# Patient Record
Sex: Male | Born: 1940 | Race: White | Hispanic: No | Marital: Married | Smoking: Former smoker
Health system: Southern US, Community
[De-identification: ages and names within clinical notes are randomized; demographics above are authoritative.]

## PROBLEM LIST (undated history)

## (undated) DIAGNOSIS — J449 Chronic obstructive pulmonary disease, unspecified: Secondary | ICD-10-CM

## (undated) DIAGNOSIS — E669 Obesity, unspecified: Secondary | ICD-10-CM

## (undated) DIAGNOSIS — I2510 Atherosclerotic heart disease of native coronary artery without angina pectoris: Secondary | ICD-10-CM

## (undated) DIAGNOSIS — I5032 Chronic diastolic (congestive) heart failure: Secondary | ICD-10-CM

## (undated) DIAGNOSIS — N400 Enlarged prostate without lower urinary tract symptoms: Secondary | ICD-10-CM

## (undated) DIAGNOSIS — J9610 Chronic respiratory failure, unspecified whether with hypoxia or hypercapnia: Secondary | ICD-10-CM

## (undated) DIAGNOSIS — I10 Essential (primary) hypertension: Secondary | ICD-10-CM

## (undated) DIAGNOSIS — J9601 Acute respiratory failure with hypoxia: Secondary | ICD-10-CM

## (undated) DIAGNOSIS — C649 Malignant neoplasm of unspecified kidney, except renal pelvis: Secondary | ICD-10-CM

## (undated) DIAGNOSIS — E7800 Pure hypercholesterolemia, unspecified: Secondary | ICD-10-CM

## (undated) DIAGNOSIS — F17210 Nicotine dependence, cigarettes, uncomplicated: Secondary | ICD-10-CM

## (undated) DIAGNOSIS — J9611 Chronic respiratory failure with hypoxia: Secondary | ICD-10-CM

## (undated) DIAGNOSIS — I471 Supraventricular tachycardia: Secondary | ICD-10-CM

## (undated) DIAGNOSIS — K219 Gastro-esophageal reflux disease without esophagitis: Secondary | ICD-10-CM

## (undated) DIAGNOSIS — K429 Umbilical hernia without obstruction or gangrene: Secondary | ICD-10-CM

## (undated) HISTORY — DX: Nicotine dependence, cigarettes, uncomplicated: F17.210

## (undated) HISTORY — DX: Essential (primary) hypertension: I10

## (undated) HISTORY — DX: Chronic respiratory failure with hypoxia: J96.11

## (undated) HISTORY — DX: Pure hypercholesterolemia, unspecified: E78.00

## (undated) HISTORY — DX: Acute respiratory failure with hypoxia: J96.01

## (undated) HISTORY — DX: Chronic diastolic (congestive) heart failure: I50.32

## (undated) HISTORY — PX: STENT REMOVAL: SHX6421

## (undated) HISTORY — DX: Atherosclerotic heart disease of native coronary artery without angina pectoris: I25.10

## (undated) HISTORY — DX: Gastro-esophageal reflux disease without esophagitis: K21.9

## (undated) HISTORY — DX: Chronic obstructive pulmonary disease, unspecified: J44.9

## (undated) HISTORY — DX: Supraventricular tachycardia: I47.1

## (undated) HISTORY — DX: Benign prostatic hyperplasia without lower urinary tract symptoms: N40.0

## (undated) HISTORY — DX: Umbilical hernia without obstruction or gangrene: K42.9

## (undated) HISTORY — PX: COLONOSCOPY: SHX174

---

## 1988-08-01 HISTORY — PX: NEPHRECTOMY: SHX65

## 2004-08-17 ENCOUNTER — Ambulatory Visit: Payer: Self-pay | Admitting: Cardiology

## 2009-02-10 ENCOUNTER — Ambulatory Visit: Payer: Self-pay | Admitting: Family Medicine

## 2009-02-12 ENCOUNTER — Ambulatory Visit: Payer: Self-pay | Admitting: Family Medicine

## 2009-09-25 ENCOUNTER — Ambulatory Visit: Payer: Self-pay | Admitting: Family Medicine

## 2009-10-12 ENCOUNTER — Ambulatory Visit: Payer: Self-pay | Admitting: Family Medicine

## 2010-07-05 ENCOUNTER — Ambulatory Visit: Payer: Self-pay

## 2011-09-08 ENCOUNTER — Ambulatory Visit: Payer: Self-pay | Admitting: Family Medicine

## 2013-10-07 ENCOUNTER — Inpatient Hospital Stay: Payer: Self-pay | Admitting: Specialist

## 2013-10-07 LAB — CBC
HCT: 48.9 % (ref 40.0–52.0)
HGB: 15.7 g/dL (ref 13.0–18.0)
MCH: 27.7 pg (ref 26.0–34.0)
MCHC: 32.1 g/dL (ref 32.0–36.0)
MCV: 86 fL (ref 80–100)
Platelet: 304 10*3/uL (ref 150–440)
RBC: 5.66 10*6/uL (ref 4.40–5.90)
RDW: 16.1 % — AB (ref 11.5–14.5)
WBC: 19.7 10*3/uL — ABNORMAL HIGH (ref 3.8–10.6)

## 2013-10-07 LAB — COMPREHENSIVE METABOLIC PANEL
ALBUMIN: 3.4 g/dL (ref 3.4–5.0)
ALK PHOS: 116 U/L
Anion Gap: 3 — ABNORMAL LOW (ref 7–16)
BUN: 17 mg/dL (ref 7–18)
Bilirubin,Total: 0.4 mg/dL (ref 0.2–1.0)
CREATININE: 1.63 mg/dL — AB (ref 0.60–1.30)
Calcium, Total: 8.9 mg/dL (ref 8.5–10.1)
Chloride: 105 mmol/L (ref 98–107)
Co2: 31 mmol/L (ref 21–32)
EGFR (African American): 47 — ABNORMAL LOW
EGFR (Non-African Amer.): 41 — ABNORMAL LOW
Glucose: 148 mg/dL — ABNORMAL HIGH (ref 65–99)
OSMOLALITY: 282 (ref 275–301)
Potassium: 4.9 mmol/L (ref 3.5–5.1)
SGOT(AST): 29 U/L (ref 15–37)
SGPT (ALT): 18 U/L (ref 12–78)
Sodium: 139 mmol/L (ref 136–145)
Total Protein: 7.8 g/dL (ref 6.4–8.2)

## 2013-10-07 LAB — PRO B NATRIURETIC PEPTIDE: B-TYPE NATIURETIC PEPTID: 63 pg/mL (ref 0–450)

## 2013-10-07 LAB — TROPONIN I: Troponin-I: <0.02 ng/mL

## 2013-10-08 LAB — CBC WITH DIFFERENTIAL/PLATELET
BASOS PCT: 0.1 %
Basophil #: 0 10*3/uL (ref 0.0–0.1)
Eosinophil #: 0 10*3/uL (ref 0.0–0.7)
Eosinophil %: 0 %
HCT: 39.2 % — AB (ref 40.0–52.0)
HGB: 12.7 g/dL — ABNORMAL LOW (ref 13.0–18.0)
Lymphocyte #: 0.5 10*3/uL — ABNORMAL LOW (ref 1.0–3.6)
Lymphocyte %: 2.5 %
MCH: 28 pg (ref 26.0–34.0)
MCHC: 32.3 g/dL (ref 32.0–36.0)
MCV: 87 fL (ref 80–100)
MONO ABS: 0.2 x10 3/mm (ref 0.2–1.0)
MONOS PCT: 1.4 %
NEUTROS ABS: 17.5 10*3/uL — AB (ref 1.4–6.5)
Neutrophil %: 96 %
PLATELETS: 241 10*3/uL (ref 150–440)
RBC: 4.53 10*6/uL (ref 4.40–5.90)
RDW: 16.1 % — ABNORMAL HIGH (ref 11.5–14.5)
WBC: 18.2 10*3/uL — ABNORMAL HIGH (ref 3.8–10.6)

## 2013-10-08 LAB — BASIC METABOLIC PANEL
Anion Gap: 6 — ABNORMAL LOW (ref 7–16)
BUN: 22 mg/dL — ABNORMAL HIGH (ref 7–18)
CO2: 27 mmol/L (ref 21–32)
Calcium, Total: 8.1 mg/dL — ABNORMAL LOW (ref 8.5–10.1)
Chloride: 104 mmol/L (ref 98–107)
Creatinine: 2.36 mg/dL — ABNORMAL HIGH (ref 0.60–1.30)
EGFR (Non-African Amer.): 26 — ABNORMAL LOW
GFR CALC AF AMER: 30 — AB
GLUCOSE: 327 mg/dL — AB (ref 65–99)
Osmolality: 290 (ref 275–301)
Potassium: 4.5 mmol/L (ref 3.5–5.1)
Sodium: 137 mmol/L (ref 136–145)

## 2013-10-08 LAB — MAGNESIUM: Magnesium: 2.2 mg/dL

## 2013-10-09 DIAGNOSIS — I059 Rheumatic mitral valve disease, unspecified: Secondary | ICD-10-CM

## 2013-10-09 LAB — URINE CULTURE

## 2013-10-09 LAB — CBC WITH DIFFERENTIAL/PLATELET
BASOS ABS: 0.1 10*3/uL (ref 0.0–0.1)
BASOS PCT: 0.5 %
EOS ABS: 0 10*3/uL (ref 0.0–0.7)
Eosinophil %: 0 %
HCT: 39.4 % — AB (ref 40.0–52.0)
HGB: 12.9 g/dL — ABNORMAL LOW (ref 13.0–18.0)
Lymphocyte #: 0.2 10*3/uL — ABNORMAL LOW (ref 1.0–3.6)
Lymphocyte %: 1.4 %
MCH: 28.5 pg (ref 26.0–34.0)
MCHC: 32.8 g/dL (ref 32.0–36.0)
MCV: 87 fL (ref 80–100)
Monocyte #: 0.5 x10 3/mm (ref 0.2–1.0)
Monocyte %: 2.7 %
Neutrophil #: 16.6 10*3/uL — ABNORMAL HIGH (ref 1.4–6.5)
Neutrophil %: 95.4 %
Platelet: 236 10*3/uL (ref 150–440)
RBC: 4.54 10*6/uL (ref 4.40–5.90)
RDW: 16.5 % — AB (ref 11.5–14.5)
WBC: 17.4 10*3/uL — ABNORMAL HIGH (ref 3.8–10.6)

## 2013-10-09 LAB — BASIC METABOLIC PANEL
ANION GAP: 4 — AB (ref 7–16)
BUN: 31 mg/dL — ABNORMAL HIGH (ref 7–18)
CREATININE: 2.04 mg/dL — AB (ref 0.60–1.30)
Calcium, Total: 8.3 mg/dL — ABNORMAL LOW (ref 8.5–10.1)
Chloride: 106 mmol/L (ref 98–107)
Co2: 26 mmol/L (ref 21–32)
EGFR (Non-African Amer.): 31 — ABNORMAL LOW
GFR CALC AF AMER: 36 — AB
GLUCOSE: 393 mg/dL — AB (ref 65–99)
Osmolality: 295 (ref 275–301)
POTASSIUM: 4.9 mmol/L (ref 3.5–5.1)
Sodium: 136 mmol/L (ref 136–145)

## 2013-10-09 LAB — TSH: THYROID STIMULATING HORM: 0.503 u[IU]/mL

## 2013-10-10 LAB — CBC WITH DIFFERENTIAL/PLATELET
BASOS ABS: 0.1 10*3/uL (ref 0.0–0.1)
Basophil %: 0.3 %
EOS ABS: 0 10*3/uL (ref 0.0–0.7)
Eosinophil %: 0 %
HCT: 40.8 % (ref 40.0–52.0)
HGB: 13.8 g/dL (ref 13.0–18.0)
LYMPHS PCT: 4.9 %
Lymphocyte #: 0.9 10*3/uL — ABNORMAL LOW (ref 1.0–3.6)
MCH: 28.9 pg (ref 26.0–34.0)
MCHC: 33.7 g/dL (ref 32.0–36.0)
MCV: 86 fL (ref 80–100)
MONOS PCT: 7.1 %
Monocyte #: 1.2 x10 3/mm — ABNORMAL HIGH (ref 0.2–1.0)
Neutrophil #: 15.4 10*3/uL — ABNORMAL HIGH (ref 1.4–6.5)
Neutrophil %: 87.7 %
Platelet: 252 10*3/uL (ref 150–440)
RBC: 4.75 10*6/uL (ref 4.40–5.90)
RDW: 16.4 % — AB (ref 11.5–14.5)
WBC: 17.6 10*3/uL — AB (ref 3.8–10.6)

## 2013-10-10 LAB — BASIC METABOLIC PANEL
Anion Gap: 6 — ABNORMAL LOW (ref 7–16)
BUN: 42 mg/dL — ABNORMAL HIGH (ref 7–18)
Calcium, Total: 8.9 mg/dL (ref 8.5–10.1)
Chloride: 107 mmol/L (ref 98–107)
Co2: 26 mmol/L (ref 21–32)
Creatinine: 2.03 mg/dL — ABNORMAL HIGH (ref 0.60–1.30)
EGFR (African American): 36 — ABNORMAL LOW
EGFR (Non-African Amer.): 31 — ABNORMAL LOW
Glucose: 124 mg/dL — ABNORMAL HIGH (ref 65–99)
Osmolality: 289 (ref 275–301)
Potassium: 4.4 mmol/L (ref 3.5–5.1)
SODIUM: 139 mmol/L (ref 136–145)

## 2013-10-11 LAB — BASIC METABOLIC PANEL
Anion Gap: 6 — ABNORMAL LOW (ref 7–16)
BUN: 67 mg/dL — ABNORMAL HIGH (ref 7–18)
CALCIUM: 9.7 mg/dL (ref 8.5–10.1)
CHLORIDE: 103 mmol/L (ref 98–107)
Co2: 32 mmol/L (ref 21–32)
Creatinine: 2.14 mg/dL — ABNORMAL HIGH (ref 0.60–1.30)
GFR CALC AF AMER: 34 — AB
GFR CALC NON AF AMER: 29 — AB
GLUCOSE: 157 mg/dL — AB (ref 65–99)
OSMOLALITY: 304 (ref 275–301)
POTASSIUM: 4.6 mmol/L (ref 3.5–5.1)
SODIUM: 141 mmol/L (ref 136–145)

## 2013-10-11 LAB — HEMOGLOBIN A1C: Hemoglobin A1C: 7.1 % — ABNORMAL HIGH (ref 4.2–6.3)

## 2013-10-12 LAB — CULTURE, BLOOD (SINGLE)

## 2013-10-12 LAB — EXPECTORATED SPUTUM ASSESSMENT W REFEX TO RESP CULTURE

## 2013-10-13 LAB — CLOSTRIDIUM DIFFICILE(ARMC)

## 2013-10-31 ENCOUNTER — Ambulatory Visit: Payer: Self-pay | Admitting: Family Medicine

## 2013-11-08 ENCOUNTER — Telehealth: Payer: Self-pay | Admitting: Pulmonary Disease

## 2013-11-08 NOTE — Telephone Encounter (Signed)
Spoke with Mickel Baas at Dr Maralyn Sago office.  First available for Dr Lake Bells in [place] is still on 12/03/12.  Could offer pt something sooner with another physician in [place].  She states pt will keep his original appt.

## 2013-11-08 NOTE — Telephone Encounter (Signed)
Dr Lake Bells not in office on 4/29, maybe she meant the 28th.Bobby Peterson

## 2013-12-03 ENCOUNTER — Encounter: Payer: Self-pay | Admitting: Pulmonary Disease

## 2013-12-03 ENCOUNTER — Ambulatory Visit (INDEPENDENT_AMBULATORY_CARE_PROVIDER_SITE_OTHER): Payer: Commercial Managed Care - HMO | Admitting: Pulmonary Disease

## 2013-12-03 VITALS — BP 124/56 | HR 71 | Ht 65.0 in | Wt 176.0 lb

## 2013-12-03 DIAGNOSIS — J9601 Acute respiratory failure with hypoxia: Secondary | ICD-10-CM

## 2013-12-03 DIAGNOSIS — J9611 Chronic respiratory failure with hypoxia: Secondary | ICD-10-CM

## 2013-12-03 DIAGNOSIS — J441 Chronic obstructive pulmonary disease with (acute) exacerbation: Secondary | ICD-10-CM

## 2013-12-03 DIAGNOSIS — R0902 Hypoxemia: Secondary | ICD-10-CM

## 2013-12-03 DIAGNOSIS — J449 Chronic obstructive pulmonary disease, unspecified: Secondary | ICD-10-CM | POA: Insufficient documentation

## 2013-12-03 DIAGNOSIS — J9610 Chronic respiratory failure, unspecified whether with hypoxia or hypercapnia: Secondary | ICD-10-CM

## 2013-12-03 DIAGNOSIS — J189 Pneumonia, unspecified organism: Secondary | ICD-10-CM | POA: Insufficient documentation

## 2013-12-03 DIAGNOSIS — J690 Pneumonitis due to inhalation of food and vomit: Secondary | ICD-10-CM | POA: Insufficient documentation

## 2013-12-03 HISTORY — DX: Acute respiratory failure with hypoxia: J96.01

## 2013-12-03 HISTORY — DX: Chronic respiratory failure with hypoxia: J96.11

## 2013-12-03 HISTORY — DX: Chronic obstructive pulmonary disease, unspecified: J44.9

## 2013-12-03 NOTE — Assessment & Plan Note (Addendum)
Continue 2 L O2 continuously 

## 2013-12-03 NOTE — Assessment & Plan Note (Addendum)
COPD: GOLD Grade D Combined recommendations from the [place], [employer] Chest Physicians, Investment banker, corporate, [place] (Qaseem A et al, Ann Intern Med. 2011;155(3):179) recommends tobacco cessation, pulmonary rehab (for symptomatic patients with an FEV1 < 50% predicted), supplemental oxygen (for patients with SaO2 <88% or paO2 <55), and appropriate bronchodilator therapy.  In regards to long acting bronchodilators, they recommend monotherapy (FEV1 60-80% with symptoms weak evidence, FEV1 with symptoms <60% strong evidence), or combination therapy (FEV1 <60% with symptoms, strong recommendation, moderate evidence).  One should also provide patients with annual immunizations and consider therapy for prevention of COPD exacerbations (ie. roflumilast or azithromycin) when appopriate.  -O2 therapy: 2L continuous -Immunizations: address next visit -Tobacco use: Quit in his mid 51's -Exercise: pulmonary rehab referral made -Bronchodilator therapy: continue Advair, use albuterol prn -Exacerbation prevention: pulm rehab

## 2013-12-03 NOTE — Patient Instructions (Signed)
Get a Chest X-ray on Friday to follow up the pneumonia Go to pulmonary rehab Keep using your oxygen as you are doing Just use the albuterol as needed Keep using the advair We will see you back in 2 months or sooner if needed

## 2013-12-03 NOTE — Assessment & Plan Note (Signed)
His most recent chest x-ray (10/31/2013) shows that the pneumonia is resolving but did not completely gone away.  I explained to him at length today that he needs to have a followup chest x-ray this week to ensure that the abnormalities on the chest x-ray have completely resolved.  If the followup chest x-ray is still abnormal I would repeat another one in a month. If a repeat chest x-ray (June 2015) did not show resolution of the pneumonia been out of order a CT scan given his smoking history. I explained to him today that he is at high risk for lung cancer and it is very important to make sure that the chest x-ray findings results.

## 2013-12-03 NOTE — Progress Notes (Signed)
Subjective:    Patient ID: Bobby Peterson, male    DOB: Jan 13, 1941, 73 y.o.   MRN: [id number]  HPI  This is a very pleasant 73 year old male who has a past medical history significant for COPD and repeated episodes of pneumonia who comes to my clinic today for evaluation of the same. He has no obstructive coronary disease and previously smoked one pack of cigarettes daily for about 50 years. He stated that he quit smoking in his mid 21s. He was diagnosed as COPD many years ago but never really needed anything for it. However in the last year he started having increasing shortness of breath with exertion. He stated that late 2014 he would start to have shortness of breath with minimal activity such as just taking a shower.  Just a few weeks ago he had been hospitalized at [facility] for what sounds like an episode of pneumonia as well as a COPD exacerbation. He had a left lower lobe pneumonia which was felt to be possibly due to aspiration. He was intubated for a few days and stayed in the ICU. His total hospital stay was 8 or 9 days he believes. He was discharged home on oxygen.  Since going home on oxygen he says that he is starting to feel more like himself. He is actually feeling less short of breath now than he was back in 2014. He says the oxygen is really helping and he uses it continuously. He is also taking Advair twice a day which he thinks helps. He takes albuterol 3 times a day no matter how he feels and he says that this really doesn't make much of a difference. He has not had hemoptysis or weight loss. He does not wheeze and he really doesn't cough much. He will occasionally producing clear sputum throughout the course the day.   Past Medical History  Diagnosis Date  . Hypertension   . Heart failure   . Diabetes   . Hypercholesteremia   . Cancer     kidney  . Kidney disease      No family history on file.   History   Social History  . Marital Status: Married    Spouse Name:  N/A    Number of Children: N/A  . Years of Education: N/A   Occupational History  . Not on file.   Social History Main Topics  . Smoking status: Former Smoker -- 1.00 packs/day for 50 years    Types: Cigarettes    Quit date: 12/04/2003  . Smokeless tobacco: Current User    Types: Chew     Comment: Has been chewing tobacco since quitting smoking in 2005.  [name] Alcohol Use: No  . Drug Use: No  . Sexual Activity: Not on file   Other Topics Concern  . Not on file   Social History Narrative  . No narrative on file     Allergies  Allergen Reactions  . Phenergan [Promethazine Hcl]     NVD     No outpatient prescriptions prior to visit.   No facility-administered medications prior to visit.      Review of Systems  Constitutional: Negative for fever and unexpected weight change.  HENT: Negative for congestion, dental problem, ear pain, nosebleeds, postnasal drip, rhinorrhea, sinus pressure, sneezing, sore throat and trouble swallowing.   Eyes: Negative for redness and itching.  Respiratory: Positive for shortness of breath. Negative for cough, chest tightness and wheezing.   Cardiovascular: Negative for palpitations and leg swelling.  Gastrointestinal: Negative for nausea and vomiting.  Genitourinary: Negative for dysuria.  Musculoskeletal: Negative for joint swelling.  Skin: Negative for rash.  Neurological: Negative for headaches.  Hematological: Does not bruise/bleed easily.  Psychiatric/Behavioral: Negative for dysphoric mood. The patient is not nervous/anxious.        Objective:   Physical Exam Filed Vitals:   12/03/13 1518  BP: 124/56  Pulse: 71  Height: 5\' 5"  (1.651 m)  Weight: 176 lb (79.833 kg)  SpO2: 95%   2L [place]  Ambulated 500 feet on 2L [place] and O2 saturation stayed above 92%  Gen: chronically ill appearing, no acute distress HEENT: NCAT, PERRL, EOMi, OP clear, neck supple without masses PULM: few crackles left base, otherwise clear CV: RRR, no mgr,  no JVD AB: BS+, soft, nontender, no hsm Ext: warm, trace pretibial edema, no clubbing, no cyanosis Derm: no rash or skin breakdown Neuro: A&Ox4, CN II-XII intact, strength 5/5 in all 4 extremities       Assessment & Plan:   COPD (chronic obstructive pulmonary disease) COPD: GOLD Grade D Combined recommendations from the [employer], [employer] of Chest Physicians, Investment banker, corporate, [employer] (Qaseem A et al, Ann Intern Med. 2011;155(3):179) recommends tobacco cessation, pulmonary rehab (for symptomatic patients with an FEV1 < 50% predicted), supplemental oxygen (for patients with SaO2 <88% or paO2 <55), and appropriate bronchodilator therapy.  In regards to long acting bronchodilators, they recommend monotherapy (FEV1 60-80% with symptoms weak evidence, FEV1 with symptoms <60% strong evidence), or combination therapy (FEV1 <60% with symptoms, strong recommendation, moderate evidence).  One should also provide patients with annual immunizations and consider therapy for prevention of COPD exacerbations (ie. roflumilast or azithromycin) when appopriate.  -O2 therapy: 2L continuous -Immunizations: address next visit -Tobacco use: Quit in his mid 39's -Exercise: pulmonary rehab referral made -Bronchodilator therapy: continue Advair, use albuterol prn -Exacerbation prevention: pulm rehab   Pneumonia His most recent chest x-ray (10/31/2013) shows that the pneumonia is resolving but did not completely gone away.  I explained to him at length today that he needs to have a followup chest x-ray this week to ensure that the abnormalities on the chest x-ray have completely resolved.  If the followup chest x-ray is still abnormal I would repeat another one in a month. If a repeat chest x-ray (June 2015) did not show resolution of the pneumonia been out of order a CT scan given his smoking history. I explained to him today that he is at high risk  for lung cancer and it is very important to make sure that the chest x-ray findings results.  Chronic hypoxemic respiratory failure Continue 2 L O2 continuously    Updated Medication List Outpatient Encounter Prescriptions as of 12/03/2013  Medication Sig  . albuterol (PROVENTIL) (2.5 MG/3ML) 0.083% nebulizer solution Take 2.5 mg by nebulization every 6 (six) hours as needed for wheezing or shortness of breath.  [name] amoxicillin (AMOXIL) 875 MG tablet Take 875 mg by mouth 2 (two) times daily.  [name] aspirin 81 MG tablet Take 81 mg by mouth daily.  [name] doxazosin (CARDURA) 2 MG tablet Take 2 mg by mouth daily.  . fluticasone (FLONASE) 50 MCG/ACT nasal spray Place 2 sprays into both nostrils daily as needed for allergies or rhinitis.  . Fluticasone-Salmeterol (ADVAIR) 250-50 MCG/DOSE AEPB Inhale 1 puff into the lungs 2 (two) times daily.  . furosemide (LASIX) 40 MG tablet Take 40 mg by mouth daily.  [name] gabapentin (NEURONTIN) 300 MG capsule  Take 300 mg by mouth 3 (three) times daily.  [name] glipiZIDE (GLUCOTROL XL) 2.5 MG 24 hr tablet Take 2.5 mg by mouth daily with breakfast.  . losartan (COZAAR) 50 MG tablet Take 50 mg by mouth daily.  . metoprolol (LOPRESSOR) 50 MG tablet Take 50 mg by mouth 2 (two) times daily.  [name] omeprazole (PRILOSEC) 40 MG capsule Take 40 mg by mouth daily.  . pravastatin (PRAVACHOL) 20 MG tablet Take 20 mg by mouth daily.  . tamsulosin (FLOMAX) 0.4 MG CAPS capsule Take 0.4 mg by mouth daily.

## 2013-12-06 ENCOUNTER — Ambulatory Visit: Payer: Self-pay | Admitting: Pulmonary Disease

## 2013-12-09 ENCOUNTER — Telehealth: Payer: Self-pay

## 2013-12-09 NOTE — Telephone Encounter (Signed)
Spoke with pt, he is aware that his cxr is ok.  Still does not want to proceed with pulmonary rehab.  Nothing further at this time.

## 2013-12-09 NOTE — Telephone Encounter (Signed)
Message copied by Len Blalock on Mon Dec 09, 2013 10:49 AM ------      Message from: Simonne Maffucci B      Created: Mon Dec 09, 2013  8:48 AM       A,            Please let him know that his CXR was OK.            Thanks      B ------

## 2014-01-29 ENCOUNTER — Encounter: Payer: Self-pay | Admitting: Pulmonary Disease

## 2014-04-08 ENCOUNTER — Ambulatory Visit: Payer: Self-pay | Admitting: Urology

## 2014-08-15 DIAGNOSIS — J449 Chronic obstructive pulmonary disease, unspecified: Secondary | ICD-10-CM | POA: Diagnosis not present

## 2014-08-16 DIAGNOSIS — J449 Chronic obstructive pulmonary disease, unspecified: Secondary | ICD-10-CM | POA: Diagnosis not present

## 2014-09-15 DIAGNOSIS — J449 Chronic obstructive pulmonary disease, unspecified: Secondary | ICD-10-CM | POA: Diagnosis not present

## 2014-09-16 DIAGNOSIS — J449 Chronic obstructive pulmonary disease, unspecified: Secondary | ICD-10-CM | POA: Diagnosis not present

## 2014-10-14 DIAGNOSIS — J449 Chronic obstructive pulmonary disease, unspecified: Secondary | ICD-10-CM | POA: Diagnosis not present

## 2014-10-15 DIAGNOSIS — J449 Chronic obstructive pulmonary disease, unspecified: Secondary | ICD-10-CM | POA: Diagnosis not present

## 2014-11-14 DIAGNOSIS — J449 Chronic obstructive pulmonary disease, unspecified: Secondary | ICD-10-CM | POA: Diagnosis not present

## 2014-11-15 DIAGNOSIS — J449 Chronic obstructive pulmonary disease, unspecified: Secondary | ICD-10-CM | POA: Diagnosis not present

## 2014-11-22 NOTE — H&P (Signed)
patient NAME:  Bobby Peterson, Bobby Peterson MR#:  [id number] DATE OF BIRTH:  Dec 17, 1940  DATE OF ADMISSION:  10/07/2013  PRIMARY CARE PHYSICIAN:  Dr. Bess Harvest.   REFERRING PHYSICIAN: Dr. Jasmine December.   CHIEF COMPLAINT: Shortness of breath.   HISTORY OF PRESENT ILLNESS: The patient is a 74 year old male who was in his usual state of health until yesterday night. Today morning, he woke up with confusion, shortness of breath and started vomiting. He was brought into the ER via EMS. The patient was unable to speak, a word or 2, with extreme shortness of breath. Oxygen sats were at 79%. The patient denies any chest pain. The patient was still vomiting intractably with shortness of breath with hypoxia. The ER physician has intubated the patient to secure his airway.  During my examination, the patient is intubated, wife is at bedside in tears. She said that he was not intubated in the past. He was in usual state of health until last night. No history of fever. No sick contacts.   PAST MEDICAL HISTORY: COPD, history of renal cancer, status post unilateral nephrectomy,  coronary artery disease, renal cancer, hyperlipidemia, hypertension.   PAST SURGICAL HISTORY: Nephrectomy from renal cancer.   No known drug allergies.   PSYCHOSOCIAL HISTORY: Lives at home with wife. He used to smoke, but quit smoking. No history of alcohol or illicit drug usage.   FAMILY HISTORY: Hypertension runs in his family according to the wife at bedside.   REVIEW OF SYSTEMS: Unobtainable, as the patient is intubated.   VITAL SIGNS: Temperature 97.9, pulse 100, respirations 14, blood pressure 140/70, pulse ox 100%.  HOME MEDICATIONS: Pravastatin 20 mg once daily, omeprazole 40 mg p.o. once a day, metoprolol succinate 1 tablet p.o. once daily, losartan 50 mg p.o. once daily, gabapentin 300 mg 1 capsule p.o. b.i.d., furosemide 40 mg once daily, doxazosin 2 mg 1 tablet p.o. once daily, aspirin 81 mg once daily.   PHYSICAL EXAMINATION:   GENERAL APPEARANCE:  Not in acute distress. Moderately built and obese.  HEENT: Normocephalic, atraumatic. Pupils are sluggishly reacting to light and accommodation, but equally reacting. Nares are patent.  Airway tube is intact as the patient intubated.  NECK: Supple. No JVD.  LUNGS: Moderate air entry with coarse bronchial breath sounds.  CARDIOVASCULAR: Tachycardic. No murmurs.  GASTROINTESTINAL: Soft. Bowel sounds are positive in all 4 quadrants. No masses felt. Nondistended. NEUROLOGIC: The patient is intubated. Motor and sensory could not be elicited. Reflexes are 2+.  EXTREMITIES: No edema. No cyanosis. No clubbing.  SKIN: Warm, dry with normal turgor. No rashes. No lesions.  MUSCULOSKELETAL: No joint effusion. No erythema noticed.  PSYCHIATRIC: Mood and affect could not be elicited as the patient is intubated.   LABORATORY AND IMAGING STUDIES: BNP 63. Glucose 148, BUN 17, creatinine 1.63, sodium 139, potassium 4.9, chloride 105, CO2 of 31, GFR is 41, anion gap 3, serum osmolality 282, calcium 8.9. LFTs are normal. Troponin less than 0.02. WBC 19.7, hemoglobin 15.7, hematocrit 48.9, platelets 304. ABG: PH 7.38 on ventilator, pCO2 of 51, pO2 of 96, FiO2 of 50%, bicarb is 30.2.  PORTABLE CHEST X-ray: Interstitial prominence may be chronic. Superimposed interstitial edema or atypical viral infection not excluded. Lingular opacity is, at least in part, chronic. Superimposed atelectasis or infiltrate not excluded. REPEAT CHEST X-RAY AFTER INTUBATION: Endotracheal tube tip projected 3.6 cm above the carina, still cardiomegaly. Similar retrocardiac consolidation and a small left lateral effusion.  TWELVE-LEAD EKG: Sinus tachycardia with premature atrial complexes at 127  beats. Normal PR and QRS interval. No acute ST-T wave changes.   ASSESSMENT AND PLAN: A 74 year old male with sudden onset of shortness of breath associated with nausea and vomiting since this morning associated with altered mental  status. He will be admitted with following assessment and plan:  1.  Altered mental status, probably from acute respiratory failure and acute exacerbation of chronic obstructive pulmonary disease.  Will admit him to CCU. The patient is status post intubation. Pulmonary consultation placed.  Repeat ABGs. The patient will be on propofol drip. 2.  Possible aspiration pneumonia. The patient will be on IV Zosyn and Levaquin.  3.  Acute cystitis. The patient will be on Levaquin. Cultures are pending.  4.  History of renal cancer with baseline chronic renal insufficiency status post nephrectomy. Apparently, the patient has a unilateral kidney.  5.  History of coronary artery disease, hyperlipidemia and hypertension. The patient is intubated. We will hold off on his p.o. medications.   We will provide GI and DVT prophylaxis.   He is FULL CODE.   Diagnosis and plan of care were discussed in detail with the patient's wife at bedside. She verbalized understanding of the plan.   Total critical care time spent is 60 minutes.    ____________________________ Nicholes Mango, MD ag:dmm D: 10/07/2013 07:56:22 ET T: 10/07/2013 09:31:24 ET JOB#: [id number]  cc: Nicholes Mango, MD, <Dictator> Nicholes Mango MD ELECTRONICALLY SIGNED 10/31/2013 2:49

## 2014-11-22 NOTE — Discharge Summary (Signed)
PATIENT NAME:  Bobby Peterson, Bobby Peterson MR#:  [id number] DATE OF BIRTH:  [birth year]  DATE OF ADMISSION:  10/07/2013 DATE OF DISCHARGE:  10/14/2013  For a detailed note, please take a look at the history and physical done on admission by Dr. Margaretmary Eddy. Please take a look at the interim discharge summary done by Dr. Fritzi Mandes which covers the extensive part of the hospital course from March 9th until March 15th. This is just a short interim course from March 15th to March 16th.   DIAGNOSES UPON DISCHARGE: As follows:  1. Metabolic encephalopathy secondary to acute respiratory failure and chronic obstructive pulmonary disease.  2. Acute hypoxic respiratory failure. Suspected to be aspiration pneumonia combined with underlying congestive heart failure.  3. Acute diastolic congestive heart failure.  4. Aspiration pneumonia.  5. Acute on chronic renal failure.  6. History of renal cancer.  7. New onset diabetes.  8. Urinary retention, status post Foley catheter placement.  9. History of benign prostatic hypertrophy.   The patient is being discharged home with home health, physical therapy and nursing services.   ACTIVITY: As tolerated.   FOLLOWUP: With the patient's primary care physician, Dr. Lelon Huh, in the next 1 to 2 weeks.    DISCHARGE MEDICATIONS: As follows: Omeprazole 40 mg daily, gabapentin 300 mg b.i.d., Toprol succinate 50 mg daily, Lasix 40 mg daily, losartan 50 mg daily, doxazosin 2 mg daily, Pravachol 20 mg at bedtime, aspirin 81 mg daily, Flomax 0.4 mg at bedtime, glipizide XL 5 mg daily, Advair 250/50 one puff b.i.d., Spiriva 1 puff daily, Augmentin 875 mg b.i.d. x 2 days.   BRIEF INTERIM HOSPITAL COURSE: As follows: This is a 74 year old male with medical problems as mentioned above. Presented to the hospital due to shortness of breath and also noted to have altered mental status.  1. Metabolic encephalopathy. This was likely secondary to respiratory failure and from the COPD  exacerbation. The patient had a CT head on admission which was negative. His mental status is now currently at baseline.  2. Acute hypoxic respiratory failure. This was secondary to a combination of aspiration pneumonia combined with diastolic CHF. The patient was treated for both. He was treated with IV antibiotics for the pneumonia, also diuresed with Lasix for the CHF. The patient was initially intubated and extubated on 10/10/2013. He has significantly improved since admission. He was ambulated today and desaturated lower than 88% and did qualify for home oxygen which was arranged for him prior to discharge. At this point, the patient will continue to treat his aspiration pneumonia with Augmentin twice daily for the next couple of days along well with his maintenance inhalers, Spiriva and Advair.  3. Aspiration pneumonia. As mentioned, the patient was initially treated with Zosyn while in the hospital. He is currently being discharged on oral Augmentin.  4. Acute renal failure. This was likely prerenal azotemia secondary from the vomiting. The patient has been hydrated with IV fluids. Creatinine is currently at baseline.  5. New onset diabetes. The patient had a hemoglobin A1c of 7.1. He is currently being discharged on oral glipizide.  6. Urinary retention. The patient has a history of BPH. Had significant urinary retention. Had to have a Foley catheter placed prior to discharge. I did add some Flomax on top of the doxazosin that he was already on, and he was referred to urology as an outpatient.   CODE STATUS: The patient is a FULL CODE.   DISPOSITION: He is being discharged home with  home health services.   TIME SPENT ON DISCHARGE: 40 minutes.   Please take a look at the interim discharge summary by Dr. Fritzi Mandes which covers the extensive part of the hospital course.   ____________________________ Belia Heman. Verdell Carmine, MD vjs:gb D: 10/14/2013 15:58:14 ET T: 10/15/2013 03:58:12  ET JOB#: [id number]  cc: Belia Heman. Verdell Carmine, MD, <Dictator> Kirstie Peri. Caryn Section, MD Janice Coffin. Elnoria Howard, DO Henreitta Leber MD ELECTRONICALLY SIGNED 10/25/2013 20:20

## 2014-11-30 HISTORY — PX: GALLBLADDER SURGERY: SHX652

## 2014-12-01 DIAGNOSIS — R1110 Vomiting, unspecified: Secondary | ICD-10-CM | POA: Diagnosis not present

## 2014-12-01 DIAGNOSIS — E785 Hyperlipidemia, unspecified: Secondary | ICD-10-CM | POA: Diagnosis not present

## 2014-12-01 DIAGNOSIS — N183 Chronic kidney disease, stage 3 (moderate): Secondary | ICD-10-CM | POA: Diagnosis not present

## 2014-12-01 DIAGNOSIS — I10 Essential (primary) hypertension: Secondary | ICD-10-CM | POA: Diagnosis not present

## 2014-12-01 DIAGNOSIS — R1111 Vomiting without nausea: Secondary | ICD-10-CM | POA: Diagnosis not present

## 2014-12-02 ENCOUNTER — Other Ambulatory Visit: Payer: Self-pay | Admitting: Family Medicine

## 2014-12-02 DIAGNOSIS — R1111 Vomiting without nausea: Secondary | ICD-10-CM

## 2014-12-05 ENCOUNTER — Ambulatory Visit
Admission: RE | Admit: 2014-12-05 | Discharge: 2014-12-05 | Disposition: A | Discharge status: Home / Self Care | Payer: Commercial Managed Care - HMO | Source: Ambulatory Visit | Attending: Family Medicine | Admitting: Family Medicine

## 2014-12-05 DIAGNOSIS — R1110 Vomiting, unspecified: Secondary | ICD-10-CM | POA: Diagnosis present

## 2014-12-05 DIAGNOSIS — K8020 Calculus of gallbladder without cholecystitis without obstruction: Secondary | ICD-10-CM | POA: Diagnosis not present

## 2014-12-05 DIAGNOSIS — K828 Other specified diseases of gallbladder: Secondary | ICD-10-CM | POA: Insufficient documentation

## 2014-12-05 DIAGNOSIS — Z905 Acquired absence of kidney: Secondary | ICD-10-CM | POA: Diagnosis not present

## 2014-12-05 DIAGNOSIS — R1111 Vomiting without nausea: Secondary | ICD-10-CM

## 2014-12-05 DIAGNOSIS — R17 Unspecified jaundice: Secondary | ICD-10-CM | POA: Diagnosis present

## 2014-12-05 DIAGNOSIS — K8050 Calculus of bile duct without cholangitis or cholecystitis without obstruction: Secondary | ICD-10-CM | POA: Insufficient documentation

## 2014-12-11 ENCOUNTER — Encounter: Payer: Self-pay | Admitting: General Surgery

## 2014-12-11 ENCOUNTER — Ambulatory Visit (INDEPENDENT_AMBULATORY_CARE_PROVIDER_SITE_OTHER): Payer: Commercial Managed Care - HMO | Admitting: General Surgery

## 2014-12-11 VITALS — BP 140/80 | HR 88 | Resp 18 | Wt 173.0 lb

## 2014-12-11 DIAGNOSIS — K420 Umbilical hernia with obstruction, without gangrene: Secondary | ICD-10-CM | POA: Diagnosis not present

## 2014-12-11 DIAGNOSIS — K8020 Calculus of gallbladder without cholecystitis without obstruction: Secondary | ICD-10-CM | POA: Diagnosis not present

## 2014-12-11 NOTE — Patient Instructions (Addendum)

## 2014-12-11 NOTE — Progress Notes (Signed)
Patient ID: Bobby Peterson, male   DOB: 01-02-1941, 74 y.o.   MRN: [id number]  Chief Complaint  Patient presents with  . Other    gallstones    HPI Bobby Peterson is a 74 y.o. male. here today for a evalautionof his gallbladder. Patient had a ultrasound done on 12-05-14. He states he started having abdominal pain Mothers day weekend with one episode of vomitng. He states he is still having mid lower abdominal pain since his exam by Frances Nickels. He does admit to belching.    HPI  Past Medical History  Diagnosis Date  . Hypertension   . Heart failure   . Diabetes   . Hypercholesteremia   . Kidney disease   . Cancer     kidney  . Oxygen decrease   . COPD (chronic obstructive pulmonary disease)   . Hernia, umbilical     Past Surgical History  Procedure Laterality Date  . Nephrectomy  1990  . Colonoscopy      History reviewed. No pertinent family history.  Social History History  Substance Use Topics  . Smoking status: Former Smoker -- 1.00 packs/day for 50 years    Types: Cigarettes    Quit date: 12/04/2003  . Smokeless tobacco: Current User    Types: Chew     Comment: Has been chewing tobacco since quitting smoking in 2005.  [name] Alcohol Use: No    Allergies  Allergen Reactions  . Phenergan [Promethazine Hcl]     NVD    Current Outpatient Prescriptions  Medication Sig Dispense Refill  . aspirin 81 MG tablet Take 81 mg by mouth daily.    . Fluticasone-Salmeterol (ADVAIR) 250-50 MCG/DOSE AEPB Inhale 1 puff into the lungs 2 (two) times daily.    . furosemide (LASIX) 40 MG tablet Take 40 mg by mouth daily.    [name] gabapentin (NEURONTIN) 300 MG capsule Take 300 mg by mouth 3 (three) times daily.    [name] losartan (COZAAR) 50 MG tablet Take 50 mg by mouth daily.    . metoprolol succinate (TOPROL-XL) 50 MG 24 hr tablet     . omeprazole (PRILOSEC) 40 MG capsule Take 40 mg by mouth daily.    . OXYGEN Inhale into the lungs daily.    . pravastatin (PRAVACHOL) 20 MG tablet Take  20 mg by mouth daily.    . tamsulosin (FLOMAX) 0.4 MG CAPS capsule Take 0.4 mg by mouth daily.    . vitamin B-12 (CYANOCOBALAMIN) 1000 MCG tablet Take 1,000 mcg by mouth once a week.     No current facility-administered medications for this visit.    Review of Systems Review of Systems  Constitutional: Negative.   Respiratory: Positive for shortness of breath.   Cardiovascular: Negative.   Gastrointestinal: Positive for vomiting and abdominal pain.    Blood pressure 140/80, pulse 88, resp. rate 18, weight 173 lb (78.472 kg).  Physical Exam Physical Exam  Constitutional: He is oriented to person, place, and time. He appears well-developed and well-nourished.  Eyes: Conjunctivae are normal. No scleral icterus.  Neck: Neck supple.  Cardiovascular: Normal rate, regular rhythm and normal heart sounds.   Pulmonary/Chest: Effort normal and breath sounds normal.  Abdominal: Soft. Normal appearance. Rigidity: nonreducible, nontender 4cm umbilical hernia. A hernia is present.  Lymphadenopathy:    He has no cervical adenopathy.  Neurological: He is alert and oriented to person, place, and time.  Skin: Skin is warm and dry.    Data Reviewed Office notes and labs.  Mild elevation of LFTs and bilirubin.  [place]: showing gallstones near the neck of the gallbladder. CBD on the upper limit of normal size.   Assessment    Symptomatic cholelithiasis; nonreducible umbilical hernia     Plan    Laparoscopic Cholecystectomy with Intraoperative Cholangiogram. The procedure, including it's potential risks and complications (including but not limited to infection, bleeding, injury to intra-abdominal organs or bile ducts, bile leak, poor cosmetic result, sepsis and death) were discussed with the patient in detail. Non-operative options, including their inherent risks (acute calculous cholecystitis with possible choledocholithiasis or gallstone pancreatitis, with the risk of ascending cholangitis, sepsis,  and death) were discussed as well. The patient expressed and understanding of what we discussed and wishes to proceed with laparoscopic cholecystectomy. The patient further understands that if it is technically not possible, or it is unsafe to proceed laparoscopically, that I will convert to an open cholecystectomy.  Also discussed repairing the umbilical hernia during the cholescytectomy.   Patient questions answered. Patient understands and is agreeable.   This patient's surgery has been scheduled for 12-22-14 at [facility]. It is okay for patient to continue 81 mg aspirin once daily.      PCP:  Leia Alf 12/11/2014, 3:33 PM

## 2014-12-12 ENCOUNTER — Telehealth: Payer: Self-pay

## 2014-12-12 NOTE — Telephone Encounter (Signed)
Spoke with patient's wife about his pre admit testing appointment. Patient is scheduled for pre admit testing at the hospital on 12/16/14 at 10:45 am. Patient is aware of date, time, and instructions.

## 2014-12-14 DIAGNOSIS — J449 Chronic obstructive pulmonary disease, unspecified: Secondary | ICD-10-CM | POA: Diagnosis not present

## 2014-12-15 DIAGNOSIS — J449 Chronic obstructive pulmonary disease, unspecified: Secondary | ICD-10-CM | POA: Diagnosis not present

## 2014-12-16 ENCOUNTER — Encounter
Admission: RE | Admit: 2014-12-16 | Discharge: 2014-12-16 | Disposition: A | Discharge status: Home / Self Care | Payer: Commercial Managed Care - HMO | Source: Ambulatory Visit | Attending: General Surgery | Admitting: General Surgery

## 2014-12-16 DIAGNOSIS — Z01812 Encounter for preprocedural laboratory examination: Secondary | ICD-10-CM | POA: Diagnosis not present

## 2014-12-16 DIAGNOSIS — Z01810 Encounter for preprocedural cardiovascular examination: Secondary | ICD-10-CM | POA: Diagnosis not present

## 2014-12-16 DIAGNOSIS — I10 Essential (primary) hypertension: Secondary | ICD-10-CM | POA: Diagnosis not present

## 2014-12-16 LAB — BASIC METABOLIC PANEL
ANION GAP: 7 (ref 5–15)
BUN: 15 mg/dL (ref 6–20)
CO2: 30 mmol/L (ref 22–32)
CREATININE: 1.48 mg/dL — AB (ref 0.61–1.24)
Calcium: 8.9 mg/dL (ref 8.9–10.3)
Chloride: 105 mmol/L (ref 101–111)
GFR, EST AFRICAN AMERICAN: 51 mL/min — AB (ref >60)
GFR, EST NON AFRICAN AMERICAN: 44 mL/min — AB (ref >60)
Glucose, Bld: 93 mg/dL (ref 65–99)
Potassium: 4.4 mmol/L (ref 3.5–5.1)
Sodium: 142 mmol/L (ref 135–145)

## 2014-12-16 LAB — CBC
HCT: 41.9 % (ref 40.0–52.0)
HEMOGLOBIN: 13.4 g/dL (ref 13.0–18.0)
MCH: 27.2 pg (ref 26.0–34.0)
MCHC: 32 g/dL (ref 32.0–36.0)
MCV: 84.9 fL (ref 80.0–100.0)
PLATELETS: 411 10*3/uL (ref 150–440)
RBC: 4.94 MIL/uL (ref 4.40–5.90)
RDW: 15.7 % — ABNORMAL HIGH (ref 11.5–14.5)
WBC: 10 10*3/uL (ref 3.8–10.6)

## 2014-12-16 NOTE — Patient Instructions (Signed)
  Your procedure is scheduled on: 12/22/14 Report to Day Surgery. To find out your arrival time please call [phone] between 1PM - 3PM on 12/19/14.  Remember: Instructions that are not followed completely may result in serious medical risk, up to and including death, or upon the discretion of your surgeon and anesthesiologist your surgery may need to be rescheduled.    __x__ 1. Do not eat food or drink liquids after midnight. No gum chewing or hard candies.     _x___ 2. No Alcohol for 24 hours before or after surgery.   ____ 3. Bring all medications with you on the day of surgery if instructed.  x  ____ 4. Notify your doctor if there is any change in your medical condition     (cold, fever, infections).     Do not wear jewelry, make-up, hairpins, clips or nail polish.  Do not wear lotions, powders, or perfumes. You may wear deodorant.  Do not shave 48 hours prior to surgery. Men may shave face and neck.  Do not bring valuables to the hospital.    [facility] is not responsible for any belongings or valuables.               Contacts, dentures or bridgework may not be worn into surgery.  Leave your suitcase in the car. After surgery it may be brought to your room.  For patients admitted to the hospital, discharge time is determined by your                treatment team.   Patients discharged the day of surgery will not be allowed to drive home.   Please read over the following fact sheets that you were given:   Surgical Site Infection Prevention   ____ Take these medicines the morning of surgery with A SIP OF WATER:    1. losartan  2. metoprolol  3. omeprazole  4.  5.  6.  ____ Fleet Enema (as directed)   __x__ Use CHG Soap as directed  __x__ Use inhalers on the day of surgery  ____ Stop metformin 2 days prior to surgery    ____ Take 1/2 of usual insulin dose the night before surgery and none on the morning of surgery.   ____ Stop Coumadin/Plavix/aspirin on   ____  Stop Anti-inflammatories on    ____ Stop supplements until after surgery.    ____ Bring C-Pap to the hospital.

## 2014-12-22 ENCOUNTER — Ambulatory Visit: Payer: Commercial Managed Care - HMO | Admitting: Anesthesiology

## 2014-12-22 ENCOUNTER — Encounter: Payer: Self-pay | Admitting: Anesthesiology

## 2014-12-22 ENCOUNTER — Ambulatory Visit: Payer: Commercial Managed Care - HMO

## 2014-12-22 ENCOUNTER — Ambulatory Visit
Admission: RE | Admit: 2014-12-22 | Discharge: 2014-12-22 | Disposition: A | Discharge status: Home / Self Care | Payer: Commercial Managed Care - HMO | Source: Ambulatory Visit | Attending: General Surgery | Admitting: General Surgery

## 2014-12-22 ENCOUNTER — Encounter
Admission: RE | Disposition: A | Discharge status: Home / Self Care | Payer: Self-pay | Source: Ambulatory Visit | Attending: General Surgery

## 2014-12-22 DIAGNOSIS — N289 Disorder of kidney and ureter, unspecified: Secondary | ICD-10-CM | POA: Insufficient documentation

## 2014-12-22 DIAGNOSIS — Z85528 Personal history of other malignant neoplasm of kidney: Secondary | ICD-10-CM | POA: Diagnosis not present

## 2014-12-22 DIAGNOSIS — Z7982 Long term (current) use of aspirin: Secondary | ICD-10-CM | POA: Diagnosis not present

## 2014-12-22 DIAGNOSIS — Z905 Acquired absence of kidney: Secondary | ICD-10-CM | POA: Diagnosis not present

## 2014-12-22 DIAGNOSIS — Z87891 Personal history of nicotine dependence: Secondary | ICD-10-CM | POA: Diagnosis not present

## 2014-12-22 DIAGNOSIS — K429 Umbilical hernia without obstruction or gangrene: Secondary | ICD-10-CM | POA: Diagnosis not present

## 2014-12-22 DIAGNOSIS — Z9049 Acquired absence of other specified parts of digestive tract: Secondary | ICD-10-CM | POA: Diagnosis not present

## 2014-12-22 DIAGNOSIS — Z79899 Other long term (current) drug therapy: Secondary | ICD-10-CM | POA: Diagnosis not present

## 2014-12-22 DIAGNOSIS — E780 Pure hypercholesterolemia: Secondary | ICD-10-CM | POA: Diagnosis not present

## 2014-12-22 DIAGNOSIS — J9611 Chronic respiratory failure with hypoxia: Secondary | ICD-10-CM | POA: Diagnosis not present

## 2014-12-22 DIAGNOSIS — K8010 Calculus of gallbladder with chronic cholecystitis without obstruction: Secondary | ICD-10-CM | POA: Diagnosis not present

## 2014-12-22 DIAGNOSIS — E119 Type 2 diabetes mellitus without complications: Secondary | ICD-10-CM | POA: Insufficient documentation

## 2014-12-22 DIAGNOSIS — I10 Essential (primary) hypertension: Secondary | ICD-10-CM | POA: Diagnosis not present

## 2014-12-22 DIAGNOSIS — J449 Chronic obstructive pulmonary disease, unspecified: Secondary | ICD-10-CM | POA: Diagnosis not present

## 2014-12-22 DIAGNOSIS — Z7951 Long term (current) use of inhaled steroids: Secondary | ICD-10-CM | POA: Diagnosis not present

## 2014-12-22 DIAGNOSIS — Z9981 Dependence on supplemental oxygen: Secondary | ICD-10-CM | POA: Diagnosis not present

## 2014-12-22 DIAGNOSIS — K420 Umbilical hernia with obstruction, without gangrene: Secondary | ICD-10-CM | POA: Insufficient documentation

## 2014-12-22 DIAGNOSIS — Z9889 Other specified postprocedural states: Secondary | ICD-10-CM | POA: Diagnosis not present

## 2014-12-22 DIAGNOSIS — K8050 Calculus of bile duct without cholangitis or cholecystitis without obstruction: Secondary | ICD-10-CM | POA: Diagnosis not present

## 2014-12-22 DIAGNOSIS — K8020 Calculus of gallbladder without cholecystitis without obstruction: Secondary | ICD-10-CM

## 2014-12-22 DIAGNOSIS — Z419 Encounter for procedure for purposes other than remedying health state, unspecified: Secondary | ICD-10-CM

## 2014-12-22 DIAGNOSIS — K8044 Calculus of bile duct with chronic cholecystitis without obstruction: Secondary | ICD-10-CM | POA: Diagnosis present

## 2014-12-22 HISTORY — PX: UMBILICAL HERNIA REPAIR: SHX196

## 2014-12-22 HISTORY — PX: CHOLECYSTECTOMY: SHX55

## 2014-12-22 HISTORY — PX: OMENTECTOMY: SHX5985

## 2014-12-22 SURGERY — LAPAROSCOPIC CHOLECYSTECTOMY WITH INTRAOPERATIVE CHOLANGIOGRAM
Anesthesia: General | Site: Abdomen

## 2014-12-22 MED ORDER — ACETAMINOPHEN 10 MG/ML IV SOLN
INTRAVENOUS | Status: AC
  Filled 2014-12-22: qty 100

## 2014-12-22 MED ORDER — ACETAMINOPHEN 10 MG/ML IV SOLN
INTRAVENOUS | Status: DC | PRN
  Administered 2014-12-22: 1000 mg via INTRAVENOUS

## 2014-12-22 MED ORDER — FENTANYL CITRATE (PF) 100 MCG/2ML IJ SOLN
25.0000 ug | INTRAMUSCULAR | Status: DC | PRN
  Administered 2014-12-22 (×4): 25 ug via INTRAVENOUS

## 2014-12-22 MED ORDER — CEFAZOLIN SODIUM-DEXTROSE 2-3 GM-% IV SOLR
INTRAVENOUS | Status: AC
  Administered 2014-12-22: 2 g via INTRAVENOUS
  Filled 2014-12-22: qty 50

## 2014-12-22 MED ORDER — NEOSTIGMINE METHYLSULFATE 10 MG/10ML IV SOLN
INTRAVENOUS | Status: DC | PRN
  Administered 2014-12-22: 4 mg via INTRAVENOUS

## 2014-12-22 MED ORDER — PHENYLEPHRINE HCL 10 MG/ML IJ SOLN
INTRAMUSCULAR | Status: DC | PRN
  Administered 2014-12-22: 200 ug via INTRAVENOUS
  Administered 2014-12-22: 100 ug via INTRAVENOUS

## 2014-12-22 MED ORDER — LACTATED RINGERS IV SOLN
INTRAVENOUS | Status: DC
  Administered 2014-12-22 (×2): via INTRAVENOUS

## 2014-12-22 MED ORDER — CEFAZOLIN SODIUM-DEXTROSE 2-3 GM-% IV SOLR
2.0000 g | INTRAVENOUS | Status: AC
  Administered 2014-12-22: 2 g via INTRAVENOUS

## 2014-12-22 MED ORDER — OXYCODONE-ACETAMINOPHEN 5-325 MG PO TABS
ORAL_TABLET | ORAL | Status: AC
  Administered 2014-12-22: 1 via ORAL
  Filled 2014-12-22: qty 1

## 2014-12-22 MED ORDER — ROCURONIUM BROMIDE 100 MG/10ML IV SOLN
INTRAVENOUS | Status: DC | PRN
  Administered 2014-12-22: 35 mg via INTRAVENOUS

## 2014-12-22 MED ORDER — ONDANSETRON HCL 4 MG/2ML IJ SOLN
INTRAMUSCULAR | Status: DC | PRN
  Administered 2014-12-22: 4 mg via INTRAVENOUS

## 2014-12-22 MED ORDER — FENTANYL CITRATE (PF) 100 MCG/2ML IJ SOLN
INTRAMUSCULAR | Status: DC | PRN
Start: 2014-12-22 — End: 2014-12-22
  Administered 2014-12-22 (×3): 50 ug via INTRAVENOUS

## 2014-12-22 MED ORDER — METOPROLOL TARTRATE 1 MG/ML IV SOLN
INTRAVENOUS | Status: DC | PRN
  Administered 2014-12-22: 3 mg via INTRAVENOUS

## 2014-12-22 MED ORDER — SODIUM CHLORIDE 0.9 % IJ SOLN
INTRAMUSCULAR | Status: AC
  Filled 2014-12-22: qty 50

## 2014-12-22 MED ORDER — PROPOFOL 10 MG/ML IV BOLUS
INTRAVENOUS | Status: DC | PRN
  Administered 2014-12-22: 120 mg via INTRAVENOUS

## 2014-12-22 MED ORDER — ONDANSETRON HCL 4 MG/2ML IJ SOLN: 4.0000 mg | Freq: Once | INTRAMUSCULAR | Status: DC | PRN

## 2014-12-22 MED ORDER — HYDROMORPHONE HCL 1 MG/ML IJ SOLN
INTRAMUSCULAR | Status: DC | PRN
  Administered 2014-12-22: 0.5 mg via INTRAVENOUS

## 2014-12-22 MED ORDER — FENTANYL CITRATE (PF) 100 MCG/2ML IJ SOLN
INTRAMUSCULAR | Status: AC
  Filled 2014-12-22: qty 2

## 2014-12-22 MED ORDER — OXYCODONE-ACETAMINOPHEN 5-325 MG PO TABS: 1.0000 | ORAL_TABLET | ORAL | Status: DC | PRN

## 2014-12-22 MED ORDER — OXYCODONE-ACETAMINOPHEN 5-325 MG PO TABS
1.0000 | ORAL_TABLET | ORAL | Status: DC | PRN
  Administered 2014-12-22: 1 via ORAL

## 2014-12-22 MED ORDER — IOTHALAMATE MEGLUMINE 60 % INJ SOLN
INTRAMUSCULAR | Status: DC | PRN
  Administered 2014-12-22: 10 mL

## 2014-12-22 MED ORDER — THROMBIN 5000 UNITS EX SOLR
CUTANEOUS | Status: DC | PRN
  Administered 2014-12-22: 5000 [IU] via TOPICAL

## 2014-12-22 MED ORDER — MIDAZOLAM HCL 5 MG/5ML IJ SOLN
INTRAMUSCULAR | Status: AC
  Administered 2014-12-22: 1 mg
  Filled 2014-12-22: qty 5

## 2014-12-22 MED ORDER — LIDOCAINE HCL (CARDIAC) 20 MG/ML IV SOLN
INTRAVENOUS | Status: DC | PRN
  Administered 2014-12-22: 100 mg via INTRAVENOUS

## 2014-12-22 MED ORDER — MIDAZOLAM HCL 5 MG/5ML IJ SOLN: 1.0000 mg | Freq: Once | INTRAMUSCULAR | Status: DC

## 2014-12-22 MED ORDER — GLYCOPYRROLATE 0.2 MG/ML IJ SOLN
INTRAMUSCULAR | Status: DC | PRN
  Administered 2014-12-22: .8 mg via INTRAVENOUS

## 2014-12-22 SURGICAL SUPPLY — 55 items
ANCHOR TIS RET SYS 235ML (MISCELLANEOUS) ×3 IMPLANT
APPLICATOR SURGIFLO (MISCELLANEOUS) ×3 IMPLANT
APPLIER CLIP LOGIC TI 5 (MISCELLANEOUS) ×6 IMPLANT
BLADE SURG 11 STRL SS SAFETY (MISCELLANEOUS) ×3 IMPLANT
BLADE SURG 15 STRL SS SAFETY (BLADE) IMPLANT
CANISTER SUCT 1200ML W/VALVE (MISCELLANEOUS) ×3 IMPLANT
CANNULA DILATOR 10 W/SLV (CANNULA) ×3 IMPLANT
CATH CHOLANG 76X19 KUMAR (CATHETERS) ×3 IMPLANT
CATH REDDICK CHOLANGI 4FR 50CM (CATHETERS) ×3 IMPLANT
CHLORAPREP W/TINT 26ML (MISCELLANEOUS) ×3 IMPLANT
CLEANER CAUTERY TIP 5X5 PAD (MISCELLANEOUS) ×2 IMPLANT
DECANTER SPIKE VIAL GLASS SM (MISCELLANEOUS) ×6 IMPLANT
DEFOGGER SCOPE WARMER CLEARIFY (MISCELLANEOUS) ×3 IMPLANT
DRAPE C-ARM XRAY 36X54 (DRAPES) ×3 IMPLANT
DRAPE INCISE IOBAN 66X45 STRL (DRAPES) ×3 IMPLANT
DRAPE LAPAROTOMY 100X77 ABD (DRAPES) ×3 IMPLANT
DRESSING TELFA 4X3 1S ST N-ADH (GAUZE/BANDAGES/DRESSINGS) ×6 IMPLANT
DRSG TEGADERM 2-3/8X2-3/4 SM (GAUZE/BANDAGES/DRESSINGS) ×9 IMPLANT
GLOVE BIO SURGEON STRL SZ7 (GLOVE) ×18 IMPLANT
GOWN STRL REUS W/ TWL LRG LVL3 (GOWN DISPOSABLE) ×6 IMPLANT
GOWN STRL REUS W/TWL LRG LVL3 (GOWN DISPOSABLE) ×3
GRASPER SUT TROCAR 14GX15 (MISCELLANEOUS) ×3 IMPLANT
HEMOSTAT SURGICEL 2X3 (HEMOSTASIS) IMPLANT
IRRIGATION STRYKERFLOW (MISCELLANEOUS) ×2 IMPLANT
IRRIGATOR STRYKERFLOW (MISCELLANEOUS) ×3
IV LACTATED RINGERS 1000ML (IV SOLUTION) ×3 IMPLANT
KIT RM TURNOVER STRD PROC AR (KITS) ×3 IMPLANT
LABEL OR SOLS (LABEL) ×3 IMPLANT
LIQUID BAND (GAUZE/BANDAGES/DRESSINGS) ×3 IMPLANT
NDL INSUFF ACCESS 14 VERSASTEP (NEEDLE) ×6 IMPLANT
NDL SAFETY 25GX1.5 (NEEDLE) ×3 IMPLANT
NS IRRIG 500ML POUR BTL (IV SOLUTION) ×3 IMPLANT
PACK BASIN MINOR ARMC (MISCELLANEOUS) IMPLANT
PACK LAP CHOLECYSTECTOMY (MISCELLANEOUS) ×3 IMPLANT
PAD CLEANER CAUTERY TIP 5X5 (MISCELLANEOUS) ×1
PAD GROUND ADULT SPLIT (MISCELLANEOUS) ×3 IMPLANT
PENCIL ELECTRO HAND CTR (MISCELLANEOUS) ×3 IMPLANT
SCISSORS METZENBAUM CVD 33 (INSTRUMENTS) ×3 IMPLANT
SLEEVE ENDOPATH XCEL 5M (ENDOMECHANICALS) ×6 IMPLANT
SPOGE SURGIFLO 8M (HEMOSTASIS) ×1
SPONGE SURGIFLO 8M (HEMOSTASIS) ×2 IMPLANT
STRIP CLOSURE SKIN 1/2X4 (GAUZE/BANDAGES/DRESSINGS) ×3 IMPLANT
SURGITIE ×3 IMPLANT
SUT PROLENE 0 CT 2 (SUTURE) ×6 IMPLANT
SUT VIC AB 0 SH 27 (SUTURE) IMPLANT
SUT VIC AB 2-0 SH 27 (SUTURE) ×1
SUT VIC AB 2-0 SH 27XBRD (SUTURE) ×2 IMPLANT
SUT VIC AB 3-0 SH 27 (SUTURE) ×1
SUT VIC AB 3-0 SH 27X BRD (SUTURE) ×2 IMPLANT
SUT VIC AB 4-0 FS2 27 (SUTURE) IMPLANT
SUT VICRYL+ 3-0 144IN (SUTURE) ×3 IMPLANT
SWABSTK COMLB BENZOIN TINCTURE (MISCELLANEOUS) ×3 IMPLANT
SYR CONTROL 10ML (SYRINGE) ×3 IMPLANT
TROCAR XCEL NON-BLD 5MMX100MML (ENDOMECHANICALS) ×3 IMPLANT
TUBING INSUFFLATOR HI FLOW (MISCELLANEOUS) ×3 IMPLANT

## 2014-12-22 NOTE — Anesthesia Postprocedure Evaluation (Signed)
  Anesthesia Post-op Note  Patient: EAMONN SERMENO  Procedure(s) Performed: Procedure(s) with comments: LAPAROSCOPIC CHOLECYSTECTOMY WITH INTRAOPERATIVE CHOLANGIOGRAM (N/A) HERNIA REPAIR UMBILICAL ADULT (N/A) OMENTECTOMY - Partial omentectomy  Anesthesia type:General  Patient location: PACU  Post pain: Pain level controlled  Post assessment: Post-op Vital signs reviewed, Patient's Cardiovascular Status Stable, Respiratory Function Stable, Patent Airway and No signs of Nausea or vomiting  Post vital signs: Reviewed and stable  Last Vitals:  Filed Vitals:   12/22/14 1505  BP:   Pulse: 68  Temp:   Resp: 22    Level of consciousness: awake, alert  and patient cooperative  Complications: No apparent anesthesia complications

## 2014-12-22 NOTE — Transfer of Care (Signed)
Immediate Anesthesia Transfer of Care Note  Patient: Bobby Peterson  Procedure(s) Performed: Procedure(s) with comments: LAPAROSCOPIC CHOLECYSTECTOMY WITH INTRAOPERATIVE CHOLANGIOGRAM (N/A) HERNIA REPAIR UMBILICAL ADULT (N/A) OMENTECTOMY - Partial omentectomy  Patient Location: PACU  Anesthesia Type:General  Level of Consciousness: awake, oriented to self only  Airway & Oxygen Therapy: Patient Spontanous Breathing  Post-op Assessment: Report given to RN  Post vital signs: Reviewed and stable  Last Vitals:  Filed Vitals:   12/22/14 1016  BP: 146/68  Pulse: 60  Temp: 36.6 C  Resp: 22    Complications: No apparent anesthesia complications

## 2014-12-22 NOTE — Discharge Instructions (Signed)
Abdominal Pain Many things can cause abdominal pain. Usually, abdominal pain is not caused by a disease and will improve without treatment. It can often be observed and treated at home. Your health care provider will do a physical exam and possibly order blood tests and X-rays to help determine the seriousness of your pain. However, in many cases, more time must pass before a clear cause of the pain can be found. Before that point, your health care provider may not know if you need more testing or further treatment. HOME CARE INSTRUCTIONS  Monitor your abdominal pain for any changes. The following actions may help to alleviate any discomfort you are experiencing:  Only take over-the-counter or prescription medicines as directed by your health care provider.  Do not take laxatives unless directed to do so by your health care provider.  Try a clear liquid diet (broth, tea, or water) as directed by your health care provider. Slowly move to a bland diet as tolerated. SEEK MEDICAL CARE IF:  You have unexplained abdominal pain.  You have abdominal pain associated with nausea or diarrhea.  You have pain when you urinate or have a bowel movement.  You experience abdominal pain that wakes you in the night.  You have abdominal pain that is worsened or improved by eating food.  You have abdominal pain that is worsened with eating fatty foods.  You have a fever. SEEK IMMEDIATE MEDICAL CARE IF:   Your pain does not go away within 2 hours.  You keep throwing up (vomiting).  Your pain is felt only in portions of the abdomen, such as the right side or the left lower portion of the abdomen.  You pass bloody or black tarry stools. MAKE SURE YOU:  Understand these instructions.   Will watch your condition.   Will get help right away if you are not doing well or get worse.  Document Released: 04/27/2005 Document Revised: 07/23/2013 Document Reviewed: 03/27/2013 [facility] Patient Information  2015 [place], [place]. This information is not intended to replace advice given to you by your health care provider. Make sure you discuss any questions you have with your health care provider.  Abdominal Pain Many things can cause abdominal pain. Usually, abdominal pain is not caused by a disease and will improve without treatment. It can often be observed and treated at home. Your health care provider will do a physical exam and possibly order blood tests and X-rays to help determine the seriousness of your pain. However, in many cases, more time must pass before a clear cause of the pain can be found. Before that point, your health care provider may not know if you need more testing or further treatment. HOME CARE INSTRUCTIONS  Monitor your abdominal pain for any changes. The following actions may help to alleviate any discomfort you are experiencing:  Only take over-the-counter or prescription medicines as directed by your health care provider.  Do not take laxatives unless directed to do so by your health care provider.  Try a clear liquid diet (broth, tea, or water) as directed by your health care provider. Slowly move to a bland diet as tolerated. SEEK MEDICAL CARE IF:  You have unexplained abdominal pain.  You have abdominal pain associated with nausea or diarrhea.  You have pain when you urinate or have a bowel movement.  You experience abdominal pain that wakes you in the night.  You have abdominal pain that is worsened or improved by eating food.  You have abdominal pain  that is worsened with eating fatty foods.  You have a fever. SEEK IMMEDIATE MEDICAL CARE IF:   Your pain does not go away within 2 hours.  You keep throwing up (vomiting).  Your pain is felt only in portions of the abdomen, such as the right side or the left lower portion of the abdomen.  You pass bloody or black tarry stools. MAKE SURE YOU:  Understand these instructions.   Will watch your  condition.   Will get help right away if you are not doing well or get worse.  Document Released: 04/27/2005 Document Revised: 07/23/2013 Document Reviewed: 03/27/2013 [facility] Patient Information 2015 [place], [place]. This information is not intended to replace advice given to you by your health care provider. Make sure you discuss any questions you have with your health care provider.  Cholecystitis  Cholecystitis is swelling and irritation (inflammation) of your gallbladder. This often happens when gallstones or sludge build up in the gallbladder. Treatment is needed right away. [place] care depends on how you were treated. In general:  If you were given antibiotic medicine, take it as told. Finish the medicine even if you start to feel better.  Only take medicines as told by your doctor.  Eat low-fat foods until your next doctor visit.  Keep all doctor visits as told. GET HELP RIGHT AWAY IF:  You have more pain and medicine does not help.  Your pain moves to a different part of your belly (abdomen) or to your back.  You have a fever.  You feel sick to your stomach (nauseous).  You throw up (vomit). MAKE SURE YOU:  Understand these instructions.  Will watch your condition.  Will get help right away if you are not doing well or get worse. Document Released: 07/07/2011 Document Revised: 10/10/2011 Document Reviewed: 07/07/2011 [facility] Patient Information 2015 [place], [place]. This information is not intended to replace advice given to you by your health care provider. Make sure you discuss any questions you have with your health care provider.  Cholecystitis  Cholecystitis is swelling and irritation (inflammation) of your gallbladder. This often happens when gallstones or sludge build up in the gallbladder. Treatment is needed right away. [place] care depends on how you were treated. In general:  If you were given antibiotic medicine, take it as told.  Finish the medicine even if you start to feel better.  Only take medicines as told by your doctor.  Eat low-fat foods until your next doctor visit.  Keep all doctor visits as told. GET HELP RIGHT AWAY IF:  You have more pain and medicine does not help.  Your pain moves to a different part of your belly (abdomen) or to your back.  You have a fever.  You feel sick to your stomach (nauseous).  You throw up (vomit). MAKE SURE YOU:  Understand these instructions.  Will watch your condition.  Will get help right away if you are not doing well or get worse. Document Released: 07/07/2011 Document Revised: 10/10/2011 Document Reviewed: 07/07/2011 [facility] Patient Information 2015 [place], [place]. This information is not intended to replace advice given to you by your health care provider. Make sure you discuss any questions you have with your health care provider.  Cholecystitis  Cholecystitis is swelling and irritation (inflammation) of your gallbladder. This often happens when gallstones or sludge build up in the gallbladder. Treatment is needed right away. [place] care depends on how you were treated. In general:  If you  were given antibiotic medicine, take it as told. Finish the medicine even if you start to feel better.  Only take medicines as told by your doctor.  Eat low-fat foods until your next doctor visit.  Keep all doctor visits as told. GET HELP RIGHT AWAY IF:  You have more pain and medicine does not help.  Your pain moves to a different part of your belly (abdomen) or to your back.  You have a fever.  You feel sick to your stomach (nauseous).  You throw up (vomit). MAKE SURE YOU:  Understand these instructions.  Will watch your condition.  Will get help right away if you are not doing well or get worse. Document Released: 07/07/2011 Document Revised: 10/10/2011 Document Reviewed: 07/07/2011 [facility] Patient Information 2015 [place], [place]. This  information is not intended to replace advice given to you by your health care provider. Make sure you discuss any questions you have with your health care provider.

## 2014-12-22 NOTE — Interval H&P Note (Signed)
History and Physical Interval Note:  12/22/2014 10:40 AM  Bobby Peterson  has presented today for surgery, with the diagnosis of [place]  The various methods of treatment have been discussed with the patient and family. After consideration of risks, benefits and other options for treatment, the patient has consented to  Procedure(s): LAPAROSCOPIC CHOLECYSTECTOMY WITH INTRAOPERATIVE CHOLANGIOGRAM (N/A) HERNIA REPAIR UMBILICAL ADULT (N/A) as a surgical intervention .  The patient's history has been reviewed, patient examined, no change in status, stable for surgery.  I have reviewed the patient's chart and labs.  Questions were answered to the patient's satisfaction.     SANKAR,SEEPLAPUTHUR G

## 2014-12-22 NOTE — Anesthesia Preprocedure Evaluation (Addendum)
Anesthesia Evaluation  Patient identified by MRN, date of birth, ID band  Reviewed: Allergy & Precautions, NPO status , Patient's Chart, lab work & pertinent test results, reviewed documented beta blocker date and time   Airway Mallampati: II  TM Distance: >3 FB     Dental  (+) Chipped   Pulmonary former smoker,          Cardiovascular hypertension,     Neuro/Psych    GI/Hepatic   Endo/Other  diabetes  Renal/GU      Musculoskeletal   Abdominal   Peds  Hematology   Anesthesia Other Findings   Reproductive/Obstetrics                           Anesthesia Physical Anesthesia Plan  ASA: III  Anesthesia Plan: General   Post-op Pain Management:    Induction: Intravenous  Airway Management Planned: Oral ETT  Additional Equipment:   Intra-op Plan:   Post-operative Plan:   Informed Consent: I have reviewed the patients History and Physical, chart, labs and discussed the procedure including the risks, benefits and alternatives for the proposed anesthesia with the patient or authorized representative who has indicated his/her understanding and acceptance.     Plan Discussed with: CRNA  Anesthesia Plan Comments:         Anesthesia Quick Evaluation

## 2014-12-22 NOTE — H&P (View-Only) (Signed)
Patient ID: Bobby Peterson, male   DOB: Sep 09, 1940, 74 y.o.   MRN: [id number]  Chief Complaint  Patient presents with  . Other    gallstones    HPI Bobby Peterson is a 74 y.o. male. here today for a evalautionof his gallbladder. Patient had a ultrasound done on 12-05-14. He states he started having abdominal pain Mothers day weekend with one episode of vomitng. He states he is still having mid lower abdominal pain since his exam by Frances Nickels. He does admit to belching.    HPI  Past Medical History  Diagnosis Date  . Hypertension   . Heart failure   . Diabetes   . Hypercholesteremia   . Kidney disease   . Cancer     kidney  . Oxygen decrease   . COPD (chronic obstructive pulmonary disease)   . Hernia, umbilical     Past Surgical History  Procedure Laterality Date  . Nephrectomy  1990  . Colonoscopy      History reviewed. No pertinent family history.  Social History History  Substance Use Topics  . Smoking status: Former Smoker -- 1.00 packs/day for 50 years    Types: Cigarettes    Quit date: 12/04/2003  . Smokeless tobacco: Current User    Types: Chew     Comment: Has been chewing tobacco since quitting smoking in 2005.  [name] Alcohol Use: No    Allergies  Allergen Reactions  . Phenergan [Promethazine Hcl]     NVD    Current Outpatient Prescriptions  Medication Sig Dispense Refill  . aspirin 81 MG tablet Take 81 mg by mouth daily.    . Fluticasone-Salmeterol (ADVAIR) 250-50 MCG/DOSE AEPB Inhale 1 puff into the lungs 2 (two) times daily.    . furosemide (LASIX) 40 MG tablet Take 40 mg by mouth daily.    [name] gabapentin (NEURONTIN) 300 MG capsule Take 300 mg by mouth 3 (three) times daily.    [name] losartan (COZAAR) 50 MG tablet Take 50 mg by mouth daily.    . metoprolol succinate (TOPROL-XL) 50 MG 24 hr tablet     . omeprazole (PRILOSEC) 40 MG capsule Take 40 mg by mouth daily.    . OXYGEN Inhale into the lungs daily.    . pravastatin (PRAVACHOL) 20 MG tablet Take  20 mg by mouth daily.    . tamsulosin (FLOMAX) 0.4 MG CAPS capsule Take 0.4 mg by mouth daily.    . vitamin B-12 (CYANOCOBALAMIN) 1000 MCG tablet Take 1,000 mcg by mouth once a week.     No current facility-administered medications for this visit.    Review of Systems Review of Systems  Constitutional: Negative.   Respiratory: Positive for shortness of breath.   Cardiovascular: Negative.   Gastrointestinal: Positive for vomiting and abdominal pain.    Blood pressure 140/80, pulse 88, resp. rate 18, weight 173 lb (78.472 kg).  Physical Exam Physical Exam  Constitutional: He is oriented to person, place, and time. He appears well-developed and well-nourished.  Eyes: Conjunctivae are normal. No scleral icterus.  Neck: Neck supple.  Cardiovascular: Normal rate, regular rhythm and normal heart sounds.   Pulmonary/Chest: Effort normal and breath sounds normal.  Abdominal: Soft. Normal appearance. Rigidity: nonreducible, nontender 4cm umbilical hernia. A hernia is present.  Lymphadenopathy:    He has no cervical adenopathy.  Neurological: He is alert and oriented to person, place, and time.  Skin: Skin is warm and dry.    Data Reviewed Office notes and labs.  Mild elevation of LFTs and bilirubin.  [place]: showing gallstones near the neck of the gallbladder. CBD on the upper limit of normal size.   Assessment    Symptomatic cholelithiasis; nonreducible umbilical hernia     Plan    Laparoscopic Cholecystectomy with Intraoperative Cholangiogram. The procedure, including it's potential risks and complications (including but not limited to infection, bleeding, injury to intra-abdominal organs or bile ducts, bile leak, poor cosmetic result, sepsis and death) were discussed with the patient in detail. Non-operative options, including their inherent risks (acute calculous cholecystitis with possible choledocholithiasis or gallstone pancreatitis, with the risk of ascending cholangitis, sepsis,  and death) were discussed as well. The patient expressed and understanding of what we discussed and wishes to proceed with laparoscopic cholecystectomy. The patient further understands that if it is technically not possible, or it is unsafe to proceed laparoscopically, that I will convert to an open cholecystectomy.  Also discussed repairing the umbilical hernia during the cholescytectomy.   Patient questions answered. Patient understands and is agreeable.   This patient's surgery has been scheduled for 12-22-14 at [facility]. It is okay for patient to continue 81 mg aspirin once daily.      PCP:  Leia Alf 12/11/2014, 3:33 PM

## 2014-12-22 NOTE — OR Nursing (Signed)
Dr. Marcello Moores at bedside patient condition improved at this time

## 2014-12-22 NOTE — OR Nursing (Signed)
Patient has a history of SR with PAC,s

## 2014-12-22 NOTE — Op Note (Signed)
Preop diagnosis: Chronic cholecystitis and cholelithiasis and incarcerated umbilical hernia  Post op diagnosis: Postop same  Operation:1. Laparoscopy and cholecystectomy with intraoperative cholangiogram. 2.repair off incarcerated umbilical hernia  Anesthesia: Gen.   Complications: None  EBL: 150 mL  Drains: None  Description: This patient was brought to the operating room put to sleep in the supine position the operating table, the abdomen was prepped and draped sterile field. Timeout was performed and  after this the umbilical hernia was operated on first the incision was made overlying the superior lip of the umbilicus for this 3 cm hernia protrusion. In the subcutaneous tissue and a well-circumscribed hernia hernia was identified with a fingerbreadth size opening the fascia. After the hernia was satisfactorily freed down to the fascia was opened to reveal a good portion of somewhat scarred omentum. The omentum could not be easily pushed back in [place] was clamped cut and ligated in with 3-0 Vicryl and removed and the remaining portion pushed back inside without any difficulty. Pursestring suture of 4 0-0 Prolene was placed and then a 12 mm port was placed at the site pneumoperitoneum was obtained. Cholecystectomy was then performed- gallbladder was noted to be moderately distended and with adhesions surrounding this but there was no evidence of acute process. Epigastric and 2 lateral 5 mm ports were placed and angled 10 mm scope was used. With the careful dissection the cystic duct was freed and was noted the cystic artery was running alongside the cystic duct before coursing away towards the gallbladder laterally- this was first freed and hemoclipped and cut. Cystic duct was fairly broad  in size. It was proximally hemoclipped and a small opening was made and a Reddick catheter was positioned. Cholangiogram was performed which showed a moderately distended duct with question of a filling defect  the distal common bile duct there were very slow entry of the contrast into the duodenum proximal hepatic duct was not well-visualized. Subsequently the catheter was removed and the cystic duct was ligated with 0 chromic suture ligature and also hemoclipped on top of this to secure this. Subsequent dissection was somewhat tedious with the gallbladder being some blunt intrahepatic in places which required careful freeing it from the gallbladder bed and minimize bleeding there was one additional artery that was encountered along the course of dissection was required subsequent control and a Hemoclip this was above and anterior to the level of the cystic duct area and in the distal in the portion the gallbladder adjacent to the liver bed. After the gallbladder was free was removed with a retrieval bag through the  umbilical port site. The gallbladder did have a stone some fluid was suctioned on the course of dissection when a small opening at been made the gallbladder bed was inspected and was decided to cover this with Surgi-Flo containing thrombin. The area was irrigated and all fluid suctioned out below and lateral to the liver. The ports were then withdrawn. The fascial opening at the umbilicus was repaired with 2 figure-of-eight sutures of 0 Prolene. Subcutaneous tissue was closed with 3-0 Vicryl. All skin incisions were closed with subcuticular 4-0 Vicryl. Steri-Strips and tincture benzoin and dry sterile dressings placed. Patient subsequently was extubated and returned recovery room in stable condition.

## 2014-12-22 NOTE — OR Nursing (Signed)
Patient from OR agitated patient combative at this time Dr, Marcello Moores called versed ordered and given patient heart rate irregular info shared with anesthesia

## 2014-12-22 NOTE — Anesthesia Procedure Notes (Signed)
Procedure Name: Intubation Date/Time: 12/22/2014 11:00 AM Performed by: Justus Memory Pre-anesthesia Checklist: Patient identified, Emergency Drugs available, Suction available and Patient being monitored Patient Re-evaluated:Patient Re-evaluated prior to inductionOxygen Delivery Method: Circle system utilized and Simple face mask Preoxygenation: Pre-oxygenation with 100% oxygen Intubation Type: IV induction Ventilation: Mask ventilation without difficulty Laryngoscope Size: Mac and 3 Grade View: Grade I Tube type: Oral Number of attempts: 1 Airway Equipment and Method: Stylet Placement Confirmation: ETT inserted through vocal cords under direct vision and positive ETCO2 Secured at: 21 cm Tube secured with: Tape Dental Injury: Teeth and Oropharynx as per pre-operative assessment

## 2014-12-23 ENCOUNTER — Encounter: Payer: Self-pay | Admitting: General Surgery

## 2014-12-24 ENCOUNTER — Telehealth: Payer: Self-pay | Admitting: *Deleted

## 2014-12-24 NOTE — Telephone Encounter (Signed)
Patients wife linda called and wants you to call her about her husband. Dr. Jamal Collin did surgery on him on 12/22/14 and he has not had a bowel movement since Sunday, and his urine is like a pinkish/reddish color.

## 2014-12-24 NOTE — Telephone Encounter (Signed)
I talked with the wife, advised to try MOM or stool softerners. Increase po water intake, agrees.

## 2014-12-25 LAB — SURGICAL PATHOLOGY

## 2015-01-01 ENCOUNTER — Telehealth: Payer: Self-pay | Admitting: *Deleted

## 2015-01-01 NOTE — Telephone Encounter (Signed)
Phone call from wife stating that he "feels bad", started yesterday. She states he heaves but no vomit. Denies fever or chills. He was constipated Monday and she ended up using MOM nad he has had some BM on Tuesday and today. Poor appetite and no energy. Appointment for 01-02-15 per [facility]. Aware to go the ER if symptoms worsen during the night.

## 2015-01-02 ENCOUNTER — Other Ambulatory Visit: Payer: Self-pay | Admitting: General Surgery

## 2015-01-02 ENCOUNTER — Encounter: Payer: Self-pay | Admitting: General Surgery

## 2015-01-02 ENCOUNTER — Other Ambulatory Visit: Payer: Self-pay

## 2015-01-02 ENCOUNTER — Ambulatory Visit
Admission: RE | Admit: 2015-01-02 | Discharge: 2015-01-02 | Disposition: A | Discharge status: Home / Self Care | Payer: Commercial Managed Care - HMO | Source: Ambulatory Visit | Attending: General Surgery | Admitting: General Surgery

## 2015-01-02 ENCOUNTER — Other Ambulatory Visit
Admission: RE | Admit: 2015-01-02 | Discharge: 2015-01-02 | Disposition: A | Discharge status: Home / Self Care | Payer: Commercial Managed Care - HMO | Source: Ambulatory Visit | Attending: General Surgery | Admitting: General Surgery

## 2015-01-02 ENCOUNTER — Ambulatory Visit (INDEPENDENT_AMBULATORY_CARE_PROVIDER_SITE_OTHER): Payer: Commercial Managed Care - HMO | Admitting: General Surgery

## 2015-01-02 VITALS — BP 108/50 | HR 87 | Temp 98.4°F | Resp 16 | Wt 163.8 lb

## 2015-01-02 DIAGNOSIS — K8050 Calculus of bile duct without cholangitis or cholecystitis without obstruction: Secondary | ICD-10-CM | POA: Diagnosis present

## 2015-01-02 DIAGNOSIS — I10 Essential (primary) hypertension: Secondary | ICD-10-CM | POA: Diagnosis not present

## 2015-01-02 DIAGNOSIS — K838 Other specified diseases of biliary tract: Secondary | ICD-10-CM | POA: Diagnosis not present

## 2015-01-02 DIAGNOSIS — Z85528 Personal history of other malignant neoplasm of kidney: Secondary | ICD-10-CM | POA: Diagnosis not present

## 2015-01-02 DIAGNOSIS — K8051 Calculus of bile duct without cholangitis or cholecystitis with obstruction: Secondary | ICD-10-CM | POA: Diagnosis present

## 2015-01-02 DIAGNOSIS — K651 Peritoneal abscess: Secondary | ICD-10-CM | POA: Diagnosis present

## 2015-01-02 DIAGNOSIS — N184 Chronic kidney disease, stage 4 (severe): Secondary | ICD-10-CM | POA: Diagnosis present

## 2015-01-02 DIAGNOSIS — K429 Umbilical hernia without obstruction or gangrene: Secondary | ICD-10-CM | POA: Diagnosis present

## 2015-01-02 DIAGNOSIS — I509 Heart failure, unspecified: Secondary | ICD-10-CM | POA: Diagnosis present

## 2015-01-02 DIAGNOSIS — J449 Chronic obstructive pulmonary disease, unspecified: Secondary | ICD-10-CM | POA: Diagnosis present

## 2015-01-02 DIAGNOSIS — Z87891 Personal history of nicotine dependence: Secondary | ICD-10-CM | POA: Diagnosis not present

## 2015-01-02 DIAGNOSIS — J9611 Chronic respiratory failure with hypoxia: Secondary | ICD-10-CM | POA: Diagnosis present

## 2015-01-02 DIAGNOSIS — D72829 Elevated white blood cell count, unspecified: Secondary | ICD-10-CM | POA: Diagnosis present

## 2015-01-02 DIAGNOSIS — Z888 Allergy status to other drugs, medicaments and biological substances status: Secondary | ICD-10-CM | POA: Diagnosis not present

## 2015-01-02 DIAGNOSIS — I129 Hypertensive chronic kidney disease with stage 1 through stage 4 chronic kidney disease, or unspecified chronic kidney disease: Secondary | ICD-10-CM | POA: Diagnosis present

## 2015-01-02 DIAGNOSIS — Z7982 Long term (current) use of aspirin: Secondary | ICD-10-CM | POA: Diagnosis not present

## 2015-01-02 DIAGNOSIS — Z9049 Acquired absence of other specified parts of digestive tract: Secondary | ICD-10-CM | POA: Diagnosis present

## 2015-01-02 DIAGNOSIS — E119 Type 2 diabetes mellitus without complications: Secondary | ICD-10-CM | POA: Diagnosis present

## 2015-01-02 DIAGNOSIS — Z79899 Other long term (current) drug therapy: Secondary | ICD-10-CM | POA: Diagnosis not present

## 2015-01-02 DIAGNOSIS — K7689 Other specified diseases of liver: Secondary | ICD-10-CM | POA: Diagnosis not present

## 2015-01-02 DIAGNOSIS — R935 Abnormal findings on diagnostic imaging of other abdominal regions, including retroperitoneum: Secondary | ICD-10-CM | POA: Diagnosis not present

## 2015-01-02 DIAGNOSIS — Z9889 Other specified postprocedural states: Secondary | ICD-10-CM | POA: Diagnosis not present

## 2015-01-02 DIAGNOSIS — R000 Tachycardia, unspecified: Secondary | ICD-10-CM | POA: Diagnosis present

## 2015-01-02 DIAGNOSIS — E780 Pure hypercholesterolemia: Secondary | ICD-10-CM | POA: Diagnosis present

## 2015-01-02 DIAGNOSIS — K828 Other specified diseases of gallbladder: Secondary | ICD-10-CM | POA: Diagnosis not present

## 2015-01-02 LAB — CBC WITH DIFFERENTIAL/PLATELET
Basophils Absolute: 0.2 10*3/uL — ABNORMAL HIGH (ref 0–0.1)
Basophils Relative: 1 %
EOS ABS: 0 10*3/uL (ref 0–0.7)
Eosinophils Relative: 0 %
HEMATOCRIT: 38.3 % — AB (ref 40.0–52.0)
Hemoglobin: 12 g/dL — ABNORMAL LOW (ref 13.0–18.0)
Lymphocytes Relative: 4 %
Lymphs Abs: 1.3 10*3/uL (ref 1.0–3.6)
MCH: 26.5 pg (ref 26.0–34.0)
MCHC: 31.4 g/dL — ABNORMAL LOW (ref 32.0–36.0)
MCV: 84.3 fL (ref 80.0–100.0)
MONO ABS: 1.1 10*3/uL — AB (ref 0.2–1.0)
MONOS PCT: 3 %
Neutro Abs: 32.3 10*3/uL — ABNORMAL HIGH (ref 1.4–6.5)
Neutrophils Relative %: 92 %
Platelets: 318 10*3/uL (ref 150–440)
RBC: 4.54 MIL/uL (ref 4.40–5.90)
RDW: 16 % — AB (ref 11.5–14.5)
WBC: 34.9 10*3/uL — ABNORMAL HIGH (ref 3.8–10.6)

## 2015-01-02 LAB — COMPREHENSIVE METABOLIC PANEL
ALK PHOS: 107 U/L (ref 38–126)
ALT: 15 U/L — ABNORMAL LOW (ref 17–63)
AST: 16 U/L (ref 15–41)
Albumin: 3 g/dL — ABNORMAL LOW (ref 3.5–5.0)
Anion gap: 13 (ref 5–15)
BUN: 29 mg/dL — AB (ref 6–20)
CALCIUM: 8.5 mg/dL — AB (ref 8.9–10.3)
CHLORIDE: 94 mmol/L — AB (ref 101–111)
CO2: 26 mmol/L (ref 22–32)
Creatinine, Ser: 2.18 mg/dL — ABNORMAL HIGH (ref 0.61–1.24)
GFR calc Af Amer: 32 mL/min — ABNORMAL LOW (ref >60)
GFR calc non Af Amer: 28 mL/min — ABNORMAL LOW (ref >60)
GLUCOSE: 116 mg/dL — AB (ref 65–99)
Potassium: 4.4 mmol/L (ref 3.5–5.1)
SODIUM: 133 mmol/L — AB (ref 135–145)
Total Bilirubin: 0.8 mg/dL (ref 0.3–1.2)
Total Protein: 7.2 g/dL (ref 6.5–8.1)

## 2015-01-02 NOTE — Progress Notes (Signed)
Patient ID: Bobby Peterson, male   DOB: Jan 23, 1941, 74 y.o.   MRN: [id number] Patient here for post op check after gallbladder removal and repair of umbilical hernia on 6/94/50. The patient states that yesterday he started feeling poorly. His wife is here today with him. She reports that he has been shaking in his arms. He reports no appetite and has been drinking a little bit. He has not had any pain medication since 12/30/14. He reports he ate some of a gravy biscuit and drank a total of 2 bottles of water yesterday. He has been using the bathroom with out difficulty. He reports some dry heaving and did have a little emesis this morning of water. He is scheduled for a metabolic panel to be drawn today.  At surgery he had IOC that showed filling defects in common bile duct-not sure if these were air bubbles. No obstruction to flow noted. Pt is on oxygen, looks ill and a bit weak but is alert, oriented.  Temp is normal. VSS. Ox sat 93% on 2l oxygen. No icterus noted.  Lungs clear. Abdomen is soft, portsites and umbilical incision all well healed. No tenderness or abd distension.  No apparent cause for pt feeling poorly last 24 hrs. Will add CBC to his labs. Also will get MRCP done. Pt to call for any worsening of his condition.

## 2015-01-02 NOTE — Progress Notes (Signed)
Patient ID: Bobby Peterson, male   DOB: [birth year], 74 y.o.   MRN: [id number]  Patient is scheduled for labs to be done at [facility] now. Patient is scheduled for an MRCP w/wo today at [facility] at 1:30 pm. He is to arrive there by 1:30 pm and be NPO for 4 hours prior. Patient is aware of date, time, and instructions.

## 2015-01-02 NOTE — Patient Instructions (Signed)
Call for any worsening of symptoms

## 2015-01-03 ENCOUNTER — Encounter: Payer: Self-pay | Admitting: *Deleted

## 2015-01-03 ENCOUNTER — Inpatient Hospital Stay
Admission: RE | Admit: 2015-01-03 | Discharge: 2015-01-07 | DRG: 444 | Disposition: A | Discharge status: Home / Self Care | Payer: Commercial Managed Care - HMO | Source: Ambulatory Visit | Attending: General Surgery | Admitting: General Surgery

## 2015-01-03 ENCOUNTER — Telehealth: Payer: Self-pay | Admitting: General Surgery

## 2015-01-03 ENCOUNTER — Other Ambulatory Visit: Payer: Self-pay | Admitting: General Surgery

## 2015-01-03 ENCOUNTER — Other Ambulatory Visit
Admission: RE | Admit: 2015-01-03 | Discharge: 2015-01-03 | Disposition: A | Discharge status: Home / Self Care | Payer: Commercial Managed Care - HMO | Source: Ambulatory Visit | Attending: General Surgery | Admitting: General Surgery

## 2015-01-03 DIAGNOSIS — E780 Pure hypercholesterolemia: Secondary | ICD-10-CM | POA: Diagnosis present

## 2015-01-03 DIAGNOSIS — K8050 Calculus of bile duct without cholangitis or cholecystitis without obstruction: Secondary | ICD-10-CM

## 2015-01-03 DIAGNOSIS — L0291 Cutaneous abscess, unspecified: Secondary | ICD-10-CM

## 2015-01-03 DIAGNOSIS — Z79899 Other long term (current) drug therapy: Secondary | ICD-10-CM

## 2015-01-03 DIAGNOSIS — Z87891 Personal history of nicotine dependence: Secondary | ICD-10-CM

## 2015-01-03 DIAGNOSIS — Z888 Allergy status to other drugs, medicaments and biological substances status: Secondary | ICD-10-CM | POA: Diagnosis not present

## 2015-01-03 DIAGNOSIS — E119 Type 2 diabetes mellitus without complications: Secondary | ICD-10-CM | POA: Diagnosis present

## 2015-01-03 DIAGNOSIS — K429 Umbilical hernia without obstruction or gangrene: Secondary | ICD-10-CM | POA: Diagnosis present

## 2015-01-03 DIAGNOSIS — Z9049 Acquired absence of other specified parts of digestive tract: Secondary | ICD-10-CM | POA: Diagnosis present

## 2015-01-03 DIAGNOSIS — Z9889 Other specified postprocedural states: Secondary | ICD-10-CM | POA: Diagnosis not present

## 2015-01-03 DIAGNOSIS — J9611 Chronic respiratory failure with hypoxia: Secondary | ICD-10-CM | POA: Diagnosis present

## 2015-01-03 DIAGNOSIS — I509 Heart failure, unspecified: Secondary | ICD-10-CM | POA: Diagnosis present

## 2015-01-03 DIAGNOSIS — K651 Peritoneal abscess: Secondary | ICD-10-CM | POA: Diagnosis present

## 2015-01-03 DIAGNOSIS — Z7982 Long term (current) use of aspirin: Secondary | ICD-10-CM

## 2015-01-03 DIAGNOSIS — R000 Tachycardia, unspecified: Secondary | ICD-10-CM | POA: Diagnosis present

## 2015-01-03 DIAGNOSIS — D72829 Elevated white blood cell count, unspecified: Secondary | ICD-10-CM | POA: Diagnosis present

## 2015-01-03 DIAGNOSIS — J449 Chronic obstructive pulmonary disease, unspecified: Secondary | ICD-10-CM | POA: Diagnosis present

## 2015-01-03 DIAGNOSIS — I129 Hypertensive chronic kidney disease with stage 1 through stage 4 chronic kidney disease, or unspecified chronic kidney disease: Secondary | ICD-10-CM | POA: Diagnosis present

## 2015-01-03 DIAGNOSIS — Z85528 Personal history of other malignant neoplasm of kidney: Secondary | ICD-10-CM | POA: Diagnosis not present

## 2015-01-03 DIAGNOSIS — R109 Unspecified abdominal pain: Secondary | ICD-10-CM

## 2015-01-03 DIAGNOSIS — K8051 Calculus of bile duct without cholangitis or cholecystitis with obstruction: Principal | ICD-10-CM | POA: Diagnosis present

## 2015-01-03 DIAGNOSIS — N184 Chronic kidney disease, stage 4 (severe): Secondary | ICD-10-CM | POA: Diagnosis present

## 2015-01-03 DIAGNOSIS — R1011 Right upper quadrant pain: Secondary | ICD-10-CM

## 2015-01-03 LAB — CBC WITH DIFFERENTIAL/PLATELET
BASOS PCT: 0 %
Basophils Absolute: 0.1 10*3/uL (ref 0–0.1)
Eosinophils Absolute: 0 10*3/uL (ref 0–0.7)
Eosinophils Relative: 0 %
HEMATOCRIT: 35.2 % — AB (ref 40.0–52.0)
Hemoglobin: 11.2 g/dL — ABNORMAL LOW (ref 13.0–18.0)
LYMPHS ABS: 1.1 10*3/uL (ref 1.0–3.6)
LYMPHS PCT: 4 %
MCH: 26.9 pg (ref 26.0–34.0)
MCHC: 31.8 g/dL — ABNORMAL LOW (ref 32.0–36.0)
MCV: 84.4 fL (ref 80.0–100.0)
MONOS PCT: 4 %
Monocytes Absolute: 1.1 10*3/uL — ABNORMAL HIGH (ref 0.2–1.0)
NEUTROS PCT: 92 %
Neutro Abs: 26.5 10*3/uL — ABNORMAL HIGH (ref 1.4–6.5)
Platelets: 334 10*3/uL (ref 150–440)
RBC: 4.17 MIL/uL — AB (ref 4.40–5.90)
RDW: 15.8 % — ABNORMAL HIGH (ref 11.5–14.5)
WBC: 28.8 10*3/uL — ABNORMAL HIGH (ref 3.8–10.6)

## 2015-01-03 LAB — URINALYSIS COMPLETE WITH MICROSCOPIC (ARMC ONLY)
Bacteria, UA: NONE SEEN
Bilirubin Urine: NEGATIVE
GLUCOSE, UA: NEGATIVE mg/dL
Ketones, ur: NEGATIVE mg/dL
NITRITE: NEGATIVE
PROTEIN: NEGATIVE mg/dL
RBC / HPF: NONE SEEN RBC/hpf (ref 0–5)
Specific Gravity, Urine: 1.014 (ref 1.005–1.030)
pH: 5 (ref 5.0–8.0)

## 2015-01-03 MED ORDER — ONDANSETRON HCL 4 MG/2ML IJ SOLN
4.0000 mg | Freq: Three times a day (TID) | INTRAMUSCULAR | Status: DC | PRN
  Administered 2015-01-04: 4 mg via INTRAVENOUS
  Filled 2015-01-03: qty 2

## 2015-01-03 MED ORDER — LOSARTAN POTASSIUM 50 MG PO TABS
50.0000 mg | ORAL_TABLET | Freq: Every day | ORAL | Status: DC
  Administered 2015-01-04 – 2015-01-06 (×3): 50 mg via ORAL
  Filled 2015-01-03 (×4): qty 1

## 2015-01-03 MED ORDER — AMOXICILLIN-POT CLAVULANATE 875-125 MG PO TABS: 1.0000 | ORAL_TABLET | Freq: Two times a day (BID) | ORAL | Status: DC

## 2015-01-03 MED ORDER — MOMETASONE FURO-FORMOTEROL FUM 100-5 MCG/ACT IN AERO
2.0000 | INHALATION_SPRAY | Freq: Two times a day (BID) | RESPIRATORY_TRACT | Status: DC
  Administered 2015-01-03 – 2015-01-06 (×7): 2 via RESPIRATORY_TRACT
  Filled 2015-01-03: qty 8.8

## 2015-01-03 MED ORDER — OXYCODONE-ACETAMINOPHEN 5-325 MG PO TABS
1.0000 | ORAL_TABLET | ORAL | Status: DC | PRN
  Administered 2015-01-04 – 2015-01-06 (×3): 1 via ORAL
  Filled 2015-01-03 (×3): qty 1

## 2015-01-03 MED ORDER — PRAVASTATIN SODIUM 20 MG PO TABS
20.0000 mg | ORAL_TABLET | Freq: Every day | ORAL | Status: DC
  Administered 2015-01-05 – 2015-01-06 (×2): 20 mg via ORAL
  Filled 2015-01-03 (×3): qty 1

## 2015-01-03 MED ORDER — PIPERACILLIN-TAZOBACTAM 3.375 G IVPB
3.3750 g | Freq: Three times a day (TID) | INTRAVENOUS | Status: DC
  Administered 2015-01-03 – 2015-01-06 (×11): 3.375 g via INTRAVENOUS
  Filled 2015-01-03 (×15): qty 50

## 2015-01-03 MED ORDER — MORPHINE SULFATE 2 MG/ML IJ SOLN
2.0000 mg | INTRAMUSCULAR | Status: DC | PRN
  Administered 2015-01-04: 2 mg via INTRAVENOUS
  Filled 2015-01-03: qty 1

## 2015-01-03 MED ORDER — FUROSEMIDE 40 MG PO TABS
40.0000 mg | ORAL_TABLET | Freq: Every day | ORAL | Status: DC
  Administered 2015-01-05 – 2015-01-06 (×2): 40 mg via ORAL
  Filled 2015-01-03 (×3): qty 1

## 2015-01-03 MED ORDER — GABAPENTIN 300 MG PO CAPS
300.0000 mg | ORAL_CAPSULE | Freq: Two times a day (BID) | ORAL | Status: DC
  Administered 2015-01-03 – 2015-01-06 (×6): 300 mg via ORAL
  Filled 2015-01-03 (×8): qty 1

## 2015-01-03 MED ORDER — METOPROLOL SUCCINATE ER 50 MG PO TB24
50.0000 mg | ORAL_TABLET | Freq: Every day | ORAL | Status: DC
  Administered 2015-01-04 – 2015-01-06 (×3): 50 mg via ORAL
  Filled 2015-01-03 (×4): qty 1

## 2015-01-03 MED ORDER — DEXTROSE-NACL 5-0.45 % IV SOLN
INTRAVENOUS | Status: DC
  Administered 2015-01-03 – 2015-01-06 (×6): via INTRAVENOUS

## 2015-01-03 MED ORDER — TAMSULOSIN HCL 0.4 MG PO CAPS
0.4000 mg | ORAL_CAPSULE | Freq: Every day | ORAL | Status: DC
  Administered 2015-01-05 – 2015-01-06 (×2): 0.4 mg via ORAL
  Filled 2015-01-03 (×3): qty 1

## 2015-01-03 MED ORDER — ASPIRIN EC 81 MG PO TBEC
81.0000 mg | DELAYED_RELEASE_TABLET | Freq: Every day | ORAL | Status: DC
  Administered 2015-01-05 – 2015-01-06 (×2): 81 mg via ORAL
  Filled 2015-01-03 (×3): qty 1

## 2015-01-03 MED ORDER — PANTOPRAZOLE SODIUM 40 MG PO TBEC
40.0000 mg | DELAYED_RELEASE_TABLET | Freq: Every day | ORAL | Status: DC
  Administered 2015-01-05 – 2015-01-06 (×2): 40 mg via ORAL
  Filled 2015-01-03 (×3): qty 1

## 2015-01-03 NOTE — Telephone Encounter (Signed)
After his office visit yesterday he had labs and MRI done. His WBC was up at 34 k. He has stones in CBD without obstruction. LFTS were normal. Also MRI showed a collection in gallbladder bed with some air bubbles-not sure if this from Surgiflo that was instilled at time of surgery or a new problem. Ms Sweetin was informed on all of the above This am pt felt better but then vomited a large amount. I have asked Pt to come to hospital for a direct admit. Plan for IV antibiotics, symptomatic care and schedule ERCP.

## 2015-01-03 NOTE — Progress Notes (Signed)
ANTIBIOTIC CONSULT NOTE - INITIAL  Pharmacy Consult for Zosyn Indication: Choledocholithiasis, possible infection in gallbladder fossa  Allergies  Allergen Reactions  . Phenergan [Promethazine Hcl]     NVD    Patient Measurements: Weight: 162 lb 6.4 oz (73.664 kg)   Vital Signs: Temp: 99.3 F (37.4 C) (06/04 1447) Temp Source: Oral (06/04 1447) BP: 103/55 mmHg (06/04 1447) Pulse Rate: 110 (06/04 1447) Intake/Output from previous day:   Intake/Output from this shift:    Labs:  Recent Labs  01/02/15 1110 01/03/15 1015  WBC 34.9* 28.8*  HGB 12.0* 11.2*  PLT 318 334  CREATININE 2.18*  --    Estimated Creatinine Clearance: 25.1 mL/min (by C-G formula based on Cr of 2.18). No results for input(s): VANCOTROUGH, VANCOPEAK, VANCORANDOM, GENTTROUGH, GENTPEAK, GENTRANDOM, TOBRATROUGH, TOBRAPEAK, TOBRARND, AMIKACINPEAK, AMIKACINTROU, AMIKACIN in the last 72 hours.   Microbiology: No results found for this or any previous visit (from the past 720 hour(s)).  Medical History: Past Medical History  Diagnosis Date  . Hypertension   . Heart failure   . Diabetes   . Hypercholesteremia   . Oxygen decrease   . COPD (chronic obstructive pulmonary disease)   . Hernia, umbilical   . Cancer     kidney  . Kidney disease     1 Kidney     Assessment: 74 yo male here with choledocholithiasis and possible infection in gallbladder fossa  Goal of Therapy:  Resolution of infection  Plan:  Will order Zosyn 3.375 g IV q8h EI   Rayna Sexton, PharmD, BCPS Clinical Pharmacist 01/03/2015,3:39 PM

## 2015-01-03 NOTE — H&P (Signed)
Bobby Peterson is an 74 y.o. male.   Chief Complaint: nausea and vomiting HPI: This is a 74 year old male who underwent a laparoscopy and cholecystectomy on 12/22/2014. The patient states he was doing well up until a day ago when he started to feel poorly and developed nausea  and some vomiting. He was seen in the office yesterday with the abdominal exam being unremarkable. The port sites and the small umbilical hernia repair incisions were all clean and intact. Patient subsequently had an MRI because of suspected common duct stones seen on intraoperative cholangiogram and also had lab values. It was noted the patient had a white count 34,000 liver functions were normal and other chemistries were normal. MRI showed evidence of stones without any obstruction to the flow of bile. Also noted was a small fluid collection containing some air bubbles in the gallbladder bed-it is unsure whether this is the result of the Surgi-Flo that was instilled at the time of surgery or a new problems suggesting infection. Today the patient was feeling much better this morning but after he ate a biscuit he developed nausea and vomited a large amount. White count was repeated today and is at 28,000. This patient denies any other symptoms in particular he is not had any abdominal pain,except the soreness associated with the umbilical hernia site.  Past Medical History  Diagnosis Date  . Hypertension   . Heart failure   . Diabetes   . Hypercholesteremia   . Oxygen decrease   . COPD (chronic obstructive pulmonary disease)   . Hernia, umbilical   . Cancer     kidney  . Kidney disease     1 Kidney    Past Surgical History  Procedure Laterality Date  . Nephrectomy  1990  . Colonoscopy    . Cholecystectomy N/A 12/22/2014    Procedure: LAPAROSCOPIC CHOLECYSTECTOMY WITH INTRAOPERATIVE CHOLANGIOGRAM;  Surgeon: Christene Lye, MD;  Location: [facility] ORS;  Service: General;  Laterality: N/A;  . Umbilical hernia repair  N/A 12/22/2014    Procedure: HERNIA REPAIR UMBILICAL ADULT;  Surgeon: Christene Lye, MD;  Location: [facility] ORS;  Service: General;  Laterality: N/A;  . Omentectomy  12/22/2014    Procedure: OMENTECTOMY;  Surgeon: Christene Lye, MD;  Location: [facility] ORS;  Service: General;;  Partial omentectomy    History reviewed. No pertinent family history. Social History:  reports that he quit smoking about 11 years ago. His smoking use included Cigarettes. He has a 50 pack-year smoking history. His smokeless tobacco use includes Chew. He reports that he does not drink alcohol or use illicit drugs.  Allergies:  Allergies  Allergen Reactions  . Phenergan [Promethazine Hcl]     NVD    Medications Prior to Admission  Medication Sig Dispense Refill  . aspirin 81 MG tablet Take 81 mg by mouth daily.    . Fluticasone-Salmeterol (ADVAIR) 250-50 MCG/DOSE AEPB Inhale 1 puff into the lungs 2 (two) times daily.    . furosemide (LASIX) 40 MG tablet Take 40 mg by mouth daily.    [name] gabapentin (NEURONTIN) 300 MG capsule Take 300 mg by mouth 2 (two) times daily.     [name] losartan (COZAAR) 50 MG tablet Take 50 mg by mouth daily.    . metoprolol succinate (TOPROL-XL) 50 MG 24 hr tablet     . omeprazole (PRILOSEC) 40 MG capsule Take 40 mg by mouth daily.    . OXYGEN Inhale into the lungs daily.    . pravastatin (PRAVACHOL)  20 MG tablet Take 20 mg by mouth daily.    . tamsulosin (FLOMAX) 0.4 MG CAPS capsule Take 0.4 mg by mouth daily.    [name] oxyCODONE-acetaminophen (ROXICET) 5-325 MG per tablet Take 1 tablet by mouth every 4 (four) hours as needed for moderate pain or severe pain. (Patient not taking: Reported on 01/03/2015) 30 tablet 0  . vitamin B-12 (CYANOCOBALAMIN) 1000 MCG tablet Take 1,000 mcg by mouth once a week.    . [DISCONTINUED] amoxicillin-clavulanate (AUGMENTIN) 875-125 MG per tablet Take 1 tablet by mouth 2 (two) times daily. 14 tablet 0    Results for orders placed or performed during the hospital  encounter of 01/03/15 (from the past 48 hour(s))  CBC with Differential/Platelet     Status: Abnormal   Collection Time: 01/03/15 10:15 AM  Result Value Ref Range   WBC 28.8 (H) 3.8 - 10.6 K/uL   RBC 4.17 (L) 4.40 - 5.90 MIL/uL   Hemoglobin 11.2 (L) 13.0 - 18.0 g/dL   HCT 35.2 (L) 40.0 - 52.0 %   MCV 84.4 80.0 - 100.0 fL   MCH 26.9 26.0 - 34.0 pg   MCHC 31.8 (L) 32.0 - 36.0 g/dL   RDW 15.8 (H) 11.5 - 14.5 %   Platelets 334 150 - 440 K/uL   Neutrophils Relative % 92 %   Neutro Abs 26.5 (H) 1.4 - 6.5 K/uL   Lymphocytes Relative 4 %   Lymphs Abs 1.1 1.0 - 3.6 K/uL   Monocytes Relative 4 %   Monocytes Absolute 1.1 (H) 0.2 - 1.0 K/uL   Eosinophils Relative 0 %   Eosinophils Absolute 0.0 0 - 0.7 K/uL   Basophils Relative 0 %   Basophils Absolute 0.1 0 - 0.1 K/uL   Mr Abdomen Mrcp Wo Cm  01/02/2015   CLINICAL DATA:  Abdominal pain. Nausea and vomiting. Choledocholithiasis.  EXAM: MRI ABDOMEN WITHOUT CONTRAST  (INCLUDING MRCP)  TECHNIQUE: Multiplanar multisequence MR imaging of the abdomen was performed. Heavily T2-weighted images of the biliary and pancreatic ducts were obtained, and three-dimensional MRCP images were rendered by post processing.  COMPARISON:  Multiple exams, including 12/22/2014 and 04/08/2014  FINDINGS: Despite efforts by the technologist and patient, motion artifact is present on today's exam and could not be eliminated. This considerably degrades the images and reduces exam sensitivity and specificity. The patient had difficulty tolerating laying in the MRI unit.  Lower chest:  Small type 1 hiatal hernia.  Hepatobiliary: Common bile duct 1.5 cm in diameter, with multiple distal filling defects in the CBD indicating choledocholithiasis. These are shown as signal hypo intensities on images 21 through 26 of series 3, measuring up to 1 cm in diameter. There is a stone in the vicinity of the ampulla measuring about 1 cm in diameter. Slightly irregular 6.1 by 6.9 cm collection of gas  and fluid along the gallbladder fossa. No focal hepatic lesion identified.  Pancreas: Unremarkable  Spleen: Unremarkable  Adrenals/Urinary Tract: We partially include a 2.1 cm exophytic T2 hyperintense lesion from the left kidney lower pole compatible with cyst. Right nephrectomy.  Stomach/Bowel: Unremarkable  Vascular/Lymphatic: Unremarkable  Other: Low-level edema along the the gallbladder fossa and hepatic margin, likely postoperative.  Musculoskeletal: Unremarkable  IMPRESSION: 1. 6.1 by 6.9 cm collection of gas and fluid in the gallbladder fossa. Given that the patient is 10 days out from the gallbladder surgery, this appearance is considered abnormal and possibility of abscess or less likely contained perforation is raised. 2. Choledocholithiasis with stones in the  distal CBD and ampullary region measuring up to 1 cm diameter. The CBD itself is dilated up to 1.5 cm. Minimal proximal intrahepatic biliary dilatation. 3. Small type 1 hiatal hernia. 4. Despite efforts by the technologist and patient, motion artifact is present on today's exam and could not be eliminated. This reduces exam sensitivity and specificity.   Electronically Signed   By: Van Clines M.D.   On: 01/02/2015 14:38    Review of Systems  Constitutional: Negative for fever and chills.  Respiratory: Negative.   Cardiovascular: Negative.   Gastrointestinal: Positive for nausea and vomiting. Negative for heartburn, diarrhea and constipation.  Genitourinary: Negative.   Musculoskeletal: Negative for myalgias, back pain and neck pain.    Blood pressure 103/55, pulse 110, temperature 99.3 F (37.4 C), temperature source Oral, resp. rate 17, weight 162 lb 6.4 oz (73.664 kg), SpO2 98 %. Physical Exam  Constitutional: He is oriented to person, place, and time. He appears well-developed and well-nourished.  Eyes: Conjunctivae are normal. No scleral icterus.  Neck: Neck supple.  Cardiovascular: Normal rate and regular rhythm.    Respiratory: Effort normal and breath sounds normal.  GI: Soft. Bowel sounds are normal. There is tenderness (mild tenderness in right upper abdomen and at umbilicus).  Neurological: He is alert and oriented to person, place, and time.  Skin: Skin is warm and dry.     Assessment/Plan Choledocholithiasis, leukocytosis. Possible infection in the gallbladder fossa. Admit patient for symptom control and IV anabiotic's. Assuming he makes a progress we will proceed with the ERCP in the early part of the this coming week  Briaunna Grindstaff G 01/03/2015, 3:23 PM

## 2015-01-04 ENCOUNTER — Inpatient Hospital Stay: Payer: Commercial Managed Care - HMO

## 2015-01-04 LAB — PROTIME-INR
INR: 1.24
PROTHROMBIN TIME: 15.8 s — AB (ref 11.4–15.0)

## 2015-01-04 LAB — CBC WITH DIFFERENTIAL/PLATELET
BASOS ABS: 0 10*3/uL (ref 0–0.1)
Basophils Relative: 0 %
EOS ABS: 0.1 10*3/uL (ref 0–0.7)
EOS PCT: 1 %
HCT: 33.7 % — ABNORMAL LOW (ref 40.0–52.0)
Hemoglobin: 10.8 g/dL — ABNORMAL LOW (ref 13.0–18.0)
LYMPHS PCT: 6 %
Lymphs Abs: 0.9 10*3/uL — ABNORMAL LOW (ref 1.0–3.6)
MCH: 27 pg (ref 26.0–34.0)
MCHC: 32.1 g/dL (ref 32.0–36.0)
MCV: 83.9 fL (ref 80.0–100.0)
Monocytes Absolute: 0.9 10*3/uL (ref 0.2–1.0)
Monocytes Relative: 6 %
NEUTROS PCT: 87 %
Neutro Abs: 14.2 10*3/uL — ABNORMAL HIGH (ref 1.4–6.5)
PLATELETS: 293 10*3/uL (ref 150–440)
RBC: 4.01 MIL/uL — AB (ref 4.40–5.90)
RDW: 15.9 % — AB (ref 11.5–14.5)
WBC: 16.1 10*3/uL — AB (ref 3.8–10.6)

## 2015-01-04 LAB — COMPREHENSIVE METABOLIC PANEL
ALT: 13 U/L — ABNORMAL LOW (ref 17–63)
AST: 16 U/L (ref 15–41)
Albumin: 2.4 g/dL — ABNORMAL LOW (ref 3.5–5.0)
Alkaline Phosphatase: 81 U/L (ref 38–126)
Anion gap: 7 (ref 5–15)
BUN: 37 mg/dL — ABNORMAL HIGH (ref 6–20)
CALCIUM: 7.8 mg/dL — AB (ref 8.9–10.3)
CO2: 28 mmol/L (ref 22–32)
CREATININE: 2.15 mg/dL — AB (ref 0.61–1.24)
Chloride: 96 mmol/L — ABNORMAL LOW (ref 101–111)
GFR calc Af Amer: 33 mL/min — ABNORMAL LOW (ref >60)
GFR, EST NON AFRICAN AMERICAN: 28 mL/min — AB (ref >60)
GLUCOSE: 131 mg/dL — AB (ref 65–99)
POTASSIUM: 3.5 mmol/L (ref 3.5–5.1)
SODIUM: 131 mmol/L — AB (ref 135–145)
Total Bilirubin: 0.6 mg/dL (ref 0.3–1.2)
Total Protein: 6.1 g/dL — ABNORMAL LOW (ref 6.5–8.1)

## 2015-01-04 MED ORDER — FENTANYL CITRATE (PF) 100 MCG/2ML IJ SOLN
INTRAMUSCULAR | Status: AC
  Administered 2015-01-04: 50 ug
  Filled 2015-01-04: qty 4

## 2015-01-04 MED ORDER — IOHEXOL 240 MG/ML SOLN: 25.0000 mL | Freq: Once | INTRAMUSCULAR | Status: AC | PRN

## 2015-01-04 MED ORDER — MIDAZOLAM HCL 5 MG/5ML IJ SOLN
INTRAMUSCULAR | Status: AC
  Administered 2015-01-04: 1 mg
  Filled 2015-01-04: qty 5

## 2015-01-04 MED ORDER — ACETAMINOPHEN 325 MG PO TABS
650.0000 mg | ORAL_TABLET | ORAL | Status: DC | PRN
  Administered 2015-01-04: 650 mg via ORAL
  Filled 2015-01-04 (×2): qty 2

## 2015-01-04 NOTE — Progress Notes (Signed)
Patient ID: Bobby Peterson, male   DOB: Aug 03, 1940, 74 y.o.   MRN: [id number] Patient states he is feeling about the same and started to feel hungry. He has developed attempt 101 this morning. No further nausea or vomiting since the admission. Abdominal exam remains basically unchanged. Lab data shows a white count has come down to 16,000. Liver function was still normal. Creatinine is up at 2.1 At this point it was prudent to obtain a CT to ensure there are no other abnormal findings in the abdomen except for the fluid collection with air in the gallbladder bed. If this is the only abnormality of concern as a source for infection we'll plan for percutaneous drainage today. CT scan is ordered without contrast and I have discussed this with the radiologist with subsequent plan for the drainage.

## 2015-01-04 NOTE — Procedures (Signed)
Successful CT guided GB fossa abscess drain insertoin 140cc bilious exudative fluid withdrawn GS/cx sent No comp Stable Keep to suction bulb for3-5 days then switch to gravity bag Need to exclude bile leak at some point Full report in pacs

## 2015-01-04 NOTE — Sedation Documentation (Signed)
Pt tolerated procedure well, vitals q59mins during sedation and procedure stable, awake and conversing with nurse and staff, resting with resp's easy, to go to specials for recovery prior to going back to room

## 2015-01-04 NOTE — Progress Notes (Signed)
Yellowish looking

## 2015-01-05 ENCOUNTER — Inpatient Hospital Stay: Payer: Commercial Managed Care - HMO | Admitting: Anesthesiology

## 2015-01-05 ENCOUNTER — Encounter
Admission: RE | Disposition: A | Discharge status: Home / Self Care | Payer: Self-pay | Source: Ambulatory Visit | Attending: General Surgery

## 2015-01-05 ENCOUNTER — Inpatient Hospital Stay: Payer: Commercial Managed Care - HMO

## 2015-01-05 ENCOUNTER — Encounter: Payer: Self-pay | Admitting: Anesthesiology

## 2015-01-05 HISTORY — PX: ERCP: SHX5425

## 2015-01-05 SURGERY — ERCP, WITH INTERVENTION IF INDICATED
Anesthesia: General

## 2015-01-05 MED ORDER — PHENYLEPHRINE HCL 10 MG/ML IJ SOLN
INTRAMUSCULAR | Status: DC | PRN
  Administered 2015-01-05 (×2): 100 ug via INTRAVENOUS

## 2015-01-05 MED ORDER — ESMOLOL HCL 10 MG/ML IV SOLN
INTRAVENOUS | Status: DC | PRN
  Administered 2015-01-05: 20 mg via INTRAVENOUS

## 2015-01-05 MED ORDER — SUCCINYLCHOLINE CHLORIDE 20 MG/ML IJ SOLN
INTRAMUSCULAR | Status: DC | PRN
  Administered 2015-01-05: 40 mg via INTRAVENOUS
  Administered 2015-01-05 (×2): 60 mg via INTRAVENOUS

## 2015-01-05 MED ORDER — FENTANYL CITRATE (PF) 100 MCG/2ML IJ SOLN
INTRAMUSCULAR | Status: DC | PRN
  Administered 2015-01-05: 50 ug via INTRAVENOUS

## 2015-01-05 MED ORDER — FENTANYL CITRATE (PF) 100 MCG/2ML IJ SOLN: 25.0000 ug | INTRAMUSCULAR | Status: DC | PRN

## 2015-01-05 MED ORDER — EPHEDRINE SULFATE 50 MG/ML IJ SOLN
INTRAMUSCULAR | Status: DC | PRN
  Administered 2015-01-05: 10 mg via INTRAVENOUS

## 2015-01-05 MED ORDER — ONDANSETRON HCL 4 MG/2ML IJ SOLN: 4.0000 mg | Freq: Once | INTRAMUSCULAR | Status: DC | PRN

## 2015-01-05 MED ORDER — PROPOFOL 10 MG/ML IV BOLUS
INTRAVENOUS | Status: DC | PRN
  Administered 2015-01-05: 100 mg via INTRAVENOUS

## 2015-01-05 MED ORDER — SODIUM CHLORIDE 0.9 % IV SOLN
INTRAVENOUS | Status: DC
  Administered 2015-01-05: 14:00:00 via INTRAVENOUS
  Administered 2015-01-05: 1000 mL via INTRAVENOUS

## 2015-01-05 MED ORDER — ONDANSETRON HCL 4 MG/2ML IJ SOLN
INTRAMUSCULAR | Status: DC | PRN
  Administered 2015-01-05: 4 mg via INTRAVENOUS

## 2015-01-05 NOTE — Anesthesia Procedure Notes (Signed)
Procedure Name: Intubation Date/Time: 01/05/2015 2:04 PM Performed by: Eliberto Ivory Pre-anesthesia Checklist: Patient identified, Patient being monitored, Timeout performed, Emergency Drugs available and Suction available Patient Re-evaluated:Patient Re-evaluated prior to inductionOxygen Delivery Method: Circle system utilized Preoxygenation: Pre-oxygenation with 100% oxygen Intubation Type: IV induction Ventilation: Mask ventilation without difficulty Laryngoscope Size: Mac and 3 Grade View: Grade I Tube type: Oral Tube size: 7.0 mm Number of attempts: 1 Airway Equipment and Method: Bite block Placement Confirmation: ETT inserted through vocal cords under direct vision,  positive ETCO2 and breath sounds checked- equal and bilateral Secured at: 21 cm Tube secured with: Tape Dental Injury: Teeth and Oropharynx as per pre-operative assessment  Future Recommendations: Recommend- induction with short-acting agent, and alternative techniques readily available

## 2015-01-05 NOTE — Op Note (Signed)
Difficult in locating small ampulla. Dilated CBD with multiple stones. NO obvious bile leakage. Sphincterotomy done with extractions of multiple stones and sludge. 10 Fr x 5 cm biliary stent placed so that bile will preferentially drain though the bile duct. Resume clear liquid diet. Stent will remain for a month or so. thanks

## 2015-01-05 NOTE — Consult Note (Signed)
[facility] [place] NAME: Bobby Peterson    MR#:  [id number]  DATE OF BIRTH:  1941/06/10  DATE OF ADMISSION:  01/03/2015  PRIMARY CARE PHYSICIAN: Lelon Huh, MD   REQUESTING/REFERRING PHYSICIAN: Christene Lye, MD  CHIEF COMPLAINT:  No chief complaint on file.   HISTORY OF PRESENT ILLNESS:  Bobby Peterson  is a 74 y.o. male with a known history of hypertension, diabetes, CHF and COPD. the patient was admitted for RUQ abdominal pain and CT show abdominal abscess. I MRCP showed distal CBD stone. The patient got percutaneous drainage done yesterday. The patient got ERCP procedure today. He developed a tachycardia at 155 after the procedure. Dr. Jamal Collin request a medical consult for tachycardia. The patient is alert awake but looks tired. The patient denies any fever or chills, no chest pain, palpitation, orthopnea or nocturnal dyspnea, no leg edema. The patient denies any other symptoms.  PAST MEDICAL HISTORY:   Past Medical History  Diagnosis Date  . Hypertension   . Heart failure   . Diabetes   . Hypercholesteremia   . Oxygen decrease   . COPD (chronic obstructive pulmonary disease)   . Hernia, umbilical   . Cancer     kidney  . Kidney disease     1 Kidney    PAST SURGICAL HISTOIRY:   Past Surgical History  Procedure Laterality Date  . Nephrectomy  1990  . Colonoscopy    . Cholecystectomy N/A 12/22/2014    Procedure: LAPAROSCOPIC CHOLECYSTECTOMY WITH INTRAOPERATIVE CHOLANGIOGRAM;  Surgeon: Christene Lye, MD;  Location: [facility] ORS;  Service: General;  Laterality: N/A;  . Umbilical hernia repair N/A 12/22/2014    Procedure: HERNIA REPAIR UMBILICAL ADULT;  Surgeon: Christene Lye, MD;  Location: [facility] ORS;  Service: General;  Laterality: N/A;  . Omentectomy  12/22/2014    Procedure: OMENTECTOMY;  Surgeon: Christene Lye, MD;  Location: [facility] ORS;  Service: General;;  Partial omentectomy    SOCIAL  HISTORY:   History  Substance Use Topics  . Smoking status: Former Smoker -- 1.00 packs/day for 50 years    Types: Cigarettes    Quit date: 12/04/2003  . Smokeless tobacco: Current User    Types: Chew     Comment: Has been chewing tobacco since quitting smoking in 2005.  [name] Alcohol Use: No    FAMILY HISTORY:  History reviewed. No pertinent family history.  DRUG ALLERGIES:   Allergies  Allergen Reactions  . Phenergan [Promethazine Hcl]     NVD    REVIEW OF SYSTEMS:  CONSTITUTIONAL: No fever, fatigue or weakness.  EYES: No blurred or double vision.  EARS, NOSE, AND THROAT: No tinnitus or ear pain.  RESPIRATORY: No cough, shortness of breath, wheezing or hemoptysis.  CARDIOVASCULAR: No chest pain, orthopnea, edema.  GASTROINTESTINAL: No nausea, vomiting, diarrhea or abdominal pain.  GENITOURINARY: No dysuria, hematuria.  ENDOCRINE: No polyuria, nocturia,  HEMATOLOGY: No anemia, easy bruising or bleeding SKIN: No rash or lesion. MUSCULOSKELETAL: No joint pain or arthritis.   NEUROLOGIC: No tingling, numbness, weakness.  PSYCHIATRY: No anxiety or depression.   MEDICATIONS AT HOME:   Prior to Admission medications   Medication Sig Start Date End Date Taking? Authorizing Provider  aspirin 81 MG tablet Take 81 mg by mouth daily.   Yes Historical Provider, MD  Fluticasone-Salmeterol (ADVAIR) 250-50 MCG/DOSE AEPB Inhale 1 puff into the lungs 2 (two) times daily.   Yes Historical Provider, MD  furosemide (LASIX) 40  MG tablet Take 40 mg by mouth daily.   Yes Historical Provider, MD  gabapentin (NEURONTIN) 300 MG capsule Take 300 mg by mouth 2 (two) times daily.    Yes Historical Provider, MD  losartan (COZAAR) 50 MG tablet Take 50 mg by mouth daily.   Yes Historical Provider, MD  metoprolol succinate (TOPROL-XL) 50 MG 24 hr tablet  10/13/14  Yes Historical Provider, MD  omeprazole (PRILOSEC) 40 MG capsule Take 40 mg by mouth daily.   Yes Historical Provider, MD  OXYGEN Inhale into  the lungs daily.   Yes Historical Provider, MD  pravastatin (PRAVACHOL) 20 MG tablet Take 20 mg by mouth daily.   Yes Historical Provider, MD  tamsulosin (FLOMAX) 0.4 MG CAPS capsule Take 0.4 mg by mouth daily.   Yes Historical Provider, MD  oxyCODONE-acetaminophen (ROXICET) 5-325 MG per tablet Take 1 tablet by mouth every 4 (four) hours as needed for moderate pain or severe pain. Patient not taking: Reported on 01/03/2015 12/22/14   Seeplaputhur Robinette Haines, MD  vitamin B-12 (CYANOCOBALAMIN) 1000 MCG tablet Take 1,000 mcg by mouth once a week.    Historical Provider, MD      VITAL SIGNS:  Blood pressure 135/77, pulse 155, temperature 98.1 F (36.7 C), temperature source Oral, resp. rate 18, height 5\' 5"  (1.651 m), weight 73.664 kg (162 lb 6.4 oz), SpO2 97 %.  PHYSICAL EXAMINATION:  GENERAL:  74 y.o.-year-old patient lying in the bed with no acute distress.  EYES: Pupils equal, round, reactive to light and accommodation. No scleral icterus. Extraocular muscles intact.  HEENT: Head atraumatic, normocephalic. Oropharynx and nasopharynx clear.  NECK:  Supple, no jugular venous distention. No thyroid enlargement, no tenderness.  LUNGS: Normal breath sounds bilaterally, no wheezing, rales,rhonchi or crepitation. No use of accessory muscles of respiration.  CARDIOVASCULAR: S1, S2 normal. No murmurs, rubs, or gallops.  ABDOMEN: Soft, mild tenderness, nondistended. Bowel sounds present. No organomegaly or mass.  EXTREMITIES: No pedal edema, cyanosis, or clubbing.  NEUROLOGIC: Cranial nerves II through XII are intact. Muscle strength 5/5 in all extremities. Sensation intact. Gait not checked.  PSYCHIATRIC: The patient is alert and oriented x 3.  SKIN: No obvious rash, lesion, or ulcer.   LABORATORY PANEL:   CBC  Recent Labs Lab 01/04/15 0701  WBC 16.1*  HGB 10.8*  HCT 33.7*  PLT 293    ------------------------------------------------------------------------------------------------------------------  Chemistries   Recent Labs Lab 01/04/15 0701  NA 131*  K 3.5  CL 96*  CO2 28  GLUCOSE 131*  BUN 37*  CREATININE 2.15*  CALCIUM 7.8*  AST 16  ALT 13*  ALKPHOS 81  BILITOT 0.6   ------------------------------------------------------------------------------------------------------------------  Cardiac Enzymes No results for input(s): TROPONINI in the last 168 hours. ------------------------------------------------------------------------------------------------------------------  RADIOLOGY:  Ct Abdomen Wo Contrast  01/04/2015   CLINICAL DATA:  Abdominal pain with nausea and vomiting and fever. Gallbladder fossa fluid collection by MR. Status post cholecystectomy 12/22/2014 with known choledocholithiasis.  EXAM: CT ABDOMEN WITHOUT CONTRAST  TECHNIQUE: Multidetector CT imaging of the abdomen was performed following the standard protocol without IV contrast.  COMPARISON:  01/02/2015, 12/22/2014  FINDINGS: Lower chest: Basilar hyperinflation noted, suspect a component of COPD. 6 mm calcified granuloma present in the left lower lobe, stable. Minor bibasilar atelectasis. No pericardial or pleural effusion. Normal heart size. Small hiatal hernia evident.  Abdomen: Irregular air-fluid collection within the gallbladder fossa adjacent to the gallbladder clips with surrounding strandy edema compatible with a gallbladder fossa abscess, measuring 7.8 x 6.4 cm, image  22.  Hyperdense distal CBD filling defects compatible with choledocholithiasis. Common bile duct remains dilated. No intrahepatic biliary dilatation.  Extending below the liver in the right abdomen, there is strandy edema and inflammation about the duodenum, hepatic flexure of the colon, and inferior liver margin. Suspect this is reactive secondary to the gallbladder fossa abscess and postoperative in nature.  Pancreas, left  adrenal gland, and left kidney are within normal limits for age except for nonobstructing left intrarenal calculi. Lower pole cyst present on the left kidney measuring 2 cm.  Patient is status post right adrenalectomy and nephrectomy.  Spleen demonstrate scattered punctate granulomatous changes.  Atherosclerosis present of the aorta and bifurcation without aneurysm.  Negative for bowel obstruction, dilatation, ileus, or free air.  Additionally, there is focal strandy edema/ inflammation about portion of the colon with adjacent diverticular change, images 47 through 51. This appears separate from the right upper quadrant inflammatory process and only partially imaged on the CT abdomen (pelvis not included). This could represent an area of developing focal diverticulitis.  IMPRESSION: 7.8 x 6.4 cm gallbladder fossa inflamed air-fluid collection compatible with a postoperative abscess.  Distal choledocholithiasis with a dilated CBD as before without intrahepatic dilatation  Focal strandy edema and inflammation about a portion of the colon in the mid abdomen, only partially imaged on this CT of the abdomen, concerning for a site of the focal diverticulitis.  Other postoperative findings as above.   Electronically Signed   By: Jerilynn Mages.  Shick M.D.   On: 01/04/2015 10:40   Ct Image Guided Drainage By Percutaneous Catheter  01/04/2015   CLINICAL DATA:  STATUS POST CHOLECYSTECTOMY 12/22/2014, ABDOMINAL PAIN, FEVER AND ELEVATED WHITE COUNT. IMAGING DEMONSTRATES GALLBLADDER FOSSA 7 CM ABSCESS.  EXAM: CT GUIDED DRAINAGE OF GALLBLADDER FOSSA ABSCESS  ANESTHESIA/SEDATION: 1.0 Mg IV Versed 50 mcg IV Fentanyl  Total Moderate Sedation Time:  15 minutes  PROCEDURE: The procedure, risks, benefits, and alternatives were explained to the patient. Questions regarding the procedure were encouraged and answered. The patient understands and consents to the procedure.  The right upper quadrant was prepped with ChloraPrepin a sterile fashion,  and a sterile drape was applied covering the operative field. A sterile gown and sterile gloves were used for the procedure. Local anesthesia was provided with 1% Lidocaine.  Previous imaging reviewed. Patient positioned supine. Noncontrast localization CT performed. The gallbladder fossa air-fluid collection was localized. Under sterile conditions and local anesthesia, an 18 gauge 15 cm access needle was advanced percutaneously into the abscess. Needle position confirmed with CT. Guidewire advanced followed by tract dilatation to insert a 10 [place] drain. Syringe aspiration yielded 140 cc bilious exudative fluid. Sample sent for Gram stain and culture. Catheter secured with a Prolene suture and connected to external suction bulb. Sterile dressing applied. No immediate complication.  COMPLICATIONS: None immediate  FINDINGS: Imaging confirms percutaneous needle access of the gallbladder fossa abscess for drain insertion  IMPRESSION: Successful CT-guided gallbladder fossa abscess drain insertion.   Electronically Signed   By: Jerilynn Mages.  Shick M.D.   On: 01/04/2015 14:04    EKG:   Orders placed or performed during the hospital encounter of 01/03/15  . EKG 12-Lead  . EKG 12-Lead  . EKG 12-Lead  . EKG 12-Lead  . EKG 12-Lead  . EKG 12-Lead    IMPRESSION AND PLAN:   Sinus tachycardia Leukocytosis, possible due to abdominal abscess. CKD stage IV Hypertension  chronic CHF Diabetes  Recommendation Tachycardia. Possible due to procedure and administer Lopressor this morning,  Improved. Repeat EKG show sinus rhythm at 93 BPM. Continue Lopressor. Abdominal abscess with leukocytosis. Continue Zosyn follow-up CBC and blood culture. CKD stage IV is stable Chronic CHF. Stable. Continue Lasix and Lopressor. Diabetes.Sliding scale.     All the records are reviewed and case discussed with Consulting provider. Management plans discussed with the patient's wife and daughter,  and they are in agreement.  CODE  STATUS: Full code   TOTAL TIME TAKING CARE OF THIS PATIENT: 46 minutes.   Thank you for the consult. Since the patient is medically stable, I will sign off. Please call consult when necessary.  Demetrios Loll M.D on 01/05/2015 at 7:42 PM  Between 7am to 6pm - Pager - [phone]  After 6pm go to [email or web address] - password [email or web address] [facility]  [facility]  Office  [phone]  CC: Primary care Physician: Lelon Huh, MD

## 2015-01-05 NOTE — Anesthesia Preprocedure Evaluation (Addendum)
Anesthesia Evaluation  Patient identified by MRN, date of birth, ID band Patient awake    Reviewed: Allergy & Precautions, NPO status , Patient's Chart, lab work & pertinent test results, reviewed documented beta blocker date and time   Airway Mallampati: II  TM Distance: >3 FB     Dental  (+) Edentulous Upper, Edentulous Lower   Pulmonary COPDformer smoker,          Cardiovascular hypertension,     Neuro/Psych    GI/Hepatic   Endo/Other  diabetes  Renal/GU Renal disease     Musculoskeletal   Abdominal   Peds  Hematology   Anesthesia Other Findings BPH.  Reproductive/Obstetrics                          Anesthesia Physical  Anesthesia Plan  ASA: III  Anesthesia Plan: General   Post-op Pain Management:    Induction: Intravenous and Rapid sequence  Airway Management Planned: Nasal Cannula and Oral ETT  Additional Equipment:   Intra-op Plan:   Post-operative Plan:   Informed Consent: I have reviewed the patients History and Physical, chart, labs and discussed the procedure including the risks, benefits and alternatives for the proposed anesthesia with the patient or authorized representative who has indicated his/her understanding and acceptance.     Plan Discussed with: CRNA  Anesthesia Plan Comments: (Should be ok without intubation. However he had broth at 0830 -  Will intubate RS.)     Anesthesia Quick Evaluation

## 2015-01-05 NOTE — Transfer of Care (Signed)
Immediate Anesthesia Transfer of Care Note  Patient: Bobby Peterson  Procedure(s) Performed: Procedure(s): ENDOSCOPIC RETROGRADE CHOLANGIOPANCREATOGRAPHY (ERCP) (N/A)  Patient Location: PACU  Anesthesia Type:General  Level of Consciousness: Alert, Awake, Oriented  Airway & Oxygen Therapy: Patient Spontanous Breathing  Post-op Assessment: Report given to RN  Post vital signs: Reviewed and stable  Last Vitals:  Filed Vitals:   01/05/15 1515  BP: 141/65  Pulse: 110  Temp: 37.5 C  Resp: 17    Complications: No apparent anesthesia complications

## 2015-01-05 NOTE — Op Note (Signed)
[facility] Patient Name: Bobby Peterson Procedure Date: 01/05/2015 1:22 PM MRN: [id number] Account #: [id number] Date of Birth: [birth year] Admit Type: Inpatient Age: 74 Room: [facility] ENDO ROOM 4 Gender: Male Note Status: Finalized Procedure:         ERCP Indications:       Abdominal pain of suspected biliary origin, Abnormal MRCP,                     Abnormal liver function test, RUQ abdominal pain Providers:         Lupita Dawn. Candace Cruise, MD Referring MD:      Orlie Pollen, MD (Referring MD), Ramonita Lab, MD                     (Referring MD) Medicines:         General Anesthesia Complications:     No immediate complications. Procedure:         Pre-Anesthesia Assessment:                    - Prior to the procedure, a History and Physical was                     performed, and patient medications, allergies and                     sensitivities were reviewed. The patient's tolerance of                     previous anesthesia was reviewed.                    - The risks and benefits of the procedure and the sedation                     options and risks were discussed with the patient. All                     questions were answered and informed consent was obtained.                    - After reviewing the risks and benefits, the patient was                     deemed in satisfactory condition to undergo the procedure.                    After obtaining informed consent, the scope was passed                     under direct vision. Throughout the procedure, the                     patient's blood pressure, pulse, and oxygen saturations                     were monitored continuously. The Olympus TJF-130                     duodenoscope (S#. [id number]) was introduced through the                     mouth, and used to inject contrast into and used to inject  contrast into the bile duct. The ERCP was performed with   difficulty due to abnormal anatomy. The patient tolerated                     the procedure well. Findings:      The scout film was normal. The esophagus was successfully intubated       under direct vision. The scope was advanced to a normal major papilla in       the descending duodenum without detailed examination of the pharynx,       larynx and associated structures, and upper GI tract. The upper GI tract       was grossly normal. The bile duct was deeply cannulated with the       short-nosed traction sphincterotome. Contrast was injected. I personally       interpreted the bile duct images. Ductal flow of contrast was adequate.       Image quality was adequate. Contrast extended to the entire biliary       tree. The main bile duct contained filling defect(s) thought to be a       stone and sludge. A straight Roadrunner wire was passed into the biliary       tree. Biliary sphincterotomy was made with a monofilament traction       (standard) sphincterotome using ERBE electrocautery. The sphincterotomy       oozed blood. The biliary tree was swept with a 12 mm balloon starting at       the bifurcation. A few stones were removed. No stones remained. Sludge       was swept from the duct. Although no obvious bile leak was seen, one 10       Fr by 5 cm temporary stent with two external flaps and two internal       flaps was placed into the common bile duct. Bile flowed through the       stent. The stent was in good position. Impression:        - A filling defect consistent with a stone and sludge was                     seen on the cholangiogram.                    - A sphincterotomy was performed.                    - The biliary tree was swept and sludge was found.                    - One temporary stent was placed into the pseudocyst.                    - Choledocholithiasis was found. Complete removal was                     accomplished by biliary sphincterotomy and balloon                      extraction.                    - No specimens collected. Recommendation:    - Observe patient's clinical course.                    - Continue present medications.                    -  The findings and recommendations were discussed with the                     patient's family. Procedure Code(s): --- Professional ---                    [phone], Endoscopic retrograde cholangiopancreatography                     (ERCP); with placement of endoscopic stent into biliary or                     pancreatic duct, including pre- and post-dilation and                     guide wire passage, when performed, including                     sphincterotomy, when performed, each stent                    15379, Endoscopic retrograde cholangiopancreatography                     (ERCP); with removal of calculi/debris from                     biliary/pancreatic duct(s) Diagnosis Code(s): --- Professional ---                    R93.2, Abnormal findings on diagnostic imaging of liver                     and biliary tract                    K80.50, Calculus of bile duct without cholangitis or                     cholecystitis without obstruction                    R10.9, Unspecified abdominal pain                    R94.5, Abnormal results of liver function studies CPT copyright 2014 American Medical Association. All rights reserved. The codes documented in this report are preliminary and upon coder review may  be revised to meet current compliance requirements. Hulen Luster, MD 01/05/2015 3:01:07 PM This report has been signed electronically. Number of Addenda: 0 Note Initiated On: 01/05/2015 1:22 PM      [facility]

## 2015-01-05 NOTE — Progress Notes (Signed)
Pt returned from ERCP with HR elevated. Dr. Jamal Collin notified with orders received for an EKG; Telemetry, and Consult for  Hospilist. Primary nurse to continue to monitor.

## 2015-01-05 NOTE — Anesthesia Postprocedure Evaluation (Signed)
  Anesthesia Post-op Note  Patient: Bobby Peterson  Procedure(s) Performed: Procedure(s): ENDOSCOPIC RETROGRADE CHOLANGIOPANCREATOGRAPHY (ERCP) (N/A)  Anesthesia type:General  Patient location: PACU  Post pain: Pain level controlled  Post assessment: Post-op Vital signs reviewed, Patient's Cardiovascular Status Stable, Respiratory Function Stable, Patent Airway and No signs of Nausea or vomiting  Post vital signs: Reviewed and stable  Last Vitals:  Filed Vitals:   01/05/15 1331  BP: 117/70  Pulse: 78  Temp:   Resp: 20    Level of consciousness: awake, alert  and patient cooperative  Complications: Tachycardia in the OR.Esmolo given. Irregular sinus rhythm. Will get 12 lead EKG. Dr GVS reviewed EKG.

## 2015-01-05 NOTE — OR Nursing (Signed)
EKG obtained to assess for apparent rhythm change during procedure.  Shown to Dr. Laureen Abrahams and reported to Dr. Candace Cruise.  No new orders obtained.

## 2015-01-05 NOTE — H&P (Signed)
GI Inpatient Consult Note  Reason for Consult: CBD obstruction/bile leak   Attending Requesting Consult:  History of Present Illness: Bobby Peterson is a 74 y.o. male who was admitted with RUQ pain, elevated WBC. CT showed RUQ abscess. MRCP showed distal CBD stone. Percutaneous drainage done yesterday. Initially, pus drainage. Now, drainage of bile. Overall feels better but still has some RUQ pain.  Past Medical History:  Past Medical History  Diagnosis Date  . Hypertension   . Heart failure   . Diabetes   . Hypercholesteremia   . Oxygen decrease   . COPD (chronic obstructive pulmonary disease)   . Hernia, umbilical   . Cancer     kidney  . Kidney disease     1 Kidney    Problem List: Patient Active Problem List   Diagnosis Date Noted  . Choledocholithiasis 01/03/2015  . COPD (chronic obstructive pulmonary disease) 12/03/2013  . Pneumonia 12/03/2013  . Chronic hypoxemic respiratory failure 12/03/2013    Past Surgical History: Past Surgical History  Procedure Laterality Date  . Nephrectomy  1990  . Colonoscopy    . Cholecystectomy N/A 12/22/2014    Procedure: LAPAROSCOPIC CHOLECYSTECTOMY WITH INTRAOPERATIVE CHOLANGIOGRAM;  Surgeon: Christene Lye, MD;  Location: [facility] ORS;  Service: General;  Laterality: N/A;  . Umbilical hernia repair N/A 12/22/2014    Procedure: HERNIA REPAIR UMBILICAL ADULT;  Surgeon: Christene Lye, MD;  Location: [facility] ORS;  Service: General;  Laterality: N/A;  . Omentectomy  12/22/2014    Procedure: OMENTECTOMY;  Surgeon: Christene Lye, MD;  Location: [facility] ORS;  Service: General;;  Partial omentectomy    Allergies: Allergies  Allergen Reactions  . Phenergan [Promethazine Hcl]     NVD    Home Medications: Prescriptions prior to admission  Medication Sig Dispense Refill Last Dose  . aspirin 81 MG tablet Take 81 mg by mouth daily.   01/03/2015 at Unknown time  . Fluticasone-Salmeterol (ADVAIR) 250-50 MCG/DOSE AEPB Inhale 1  puff into the lungs 2 (two) times daily.   01/02/2015 at Unknown time  . furosemide (LASIX) 40 MG tablet Take 40 mg by mouth daily.   01/03/2015 at Unknown time  . gabapentin (NEURONTIN) 300 MG capsule Take 300 mg by mouth 2 (two) times daily.    01/03/2015 at Unknown time  . losartan (COZAAR) 50 MG tablet Take 50 mg by mouth daily.   01/03/2015 at Unknown time  . metoprolol succinate (TOPROL-XL) 50 MG 24 hr tablet    01/03/2015 at Unknown time  . omeprazole (PRILOSEC) 40 MG capsule Take 40 mg by mouth daily.   01/03/2015 at Unknown time  . OXYGEN Inhale into the lungs daily.   01/03/2015 at Unknown time  . pravastatin (PRAVACHOL) 20 MG tablet Take 20 mg by mouth daily.   01/02/2015 at Unknown time  . tamsulosin (FLOMAX) 0.4 MG CAPS capsule Take 0.4 mg by mouth daily.   01/03/2015 at Unknown time  . oxyCODONE-acetaminophen (ROXICET) 5-325 MG per tablet Take 1 tablet by mouth every 4 (four) hours as needed for moderate pain or severe pain. (Patient not taking: Reported on 01/03/2015) 30 tablet 0 Not Taking at Unknown time  . vitamin B-12 (CYANOCOBALAMIN) 1000 MCG tablet Take 1,000 mcg by mouth once a week.   Taking  . [DISCONTINUED] amoxicillin-clavulanate (AUGMENTIN) 875-125 MG per tablet Take 1 tablet by mouth 2 (two) times daily. 14 tablet 0    Home medication reconciliation was completed with the patient.   Scheduled Inpatient Medications:   .  aspirin EC  81 mg Oral Daily  . furosemide  40 mg Oral Daily  . gabapentin  300 mg Oral BID  . losartan  50 mg Oral Daily  . metoprolol succinate  50 mg Oral Daily  . mometasone-formoterol  2 puff Inhalation BID  . pantoprazole  40 mg Oral Daily  . piperacillin-tazobactam (ZOSYN)  IV  3.375 g Intravenous 3 times per day  . pravastatin  20 mg Oral Daily  . tamsulosin  0.4 mg Oral Daily    Continuous Inpatient Infusions:   . dextrose 5 % and 0.45% NaCl 100 mL/hr at 01/04/15 2230    PRN Inpatient Medications:  acetaminophen, morphine injection, ondansetron  (ZOFRAN) IV, oxyCODONE-acetaminophen  Family History: family history is not on file.  The patient's family history is negative for inflammatory bowel disorders, GI malignancy, or solid organ transplantation.  Social History:   reports that he quit smoking about 11 years ago. His smoking use included Cigarettes. He has a 50 pack-year smoking history. His smokeless tobacco use includes Chew. He reports that he does not drink alcohol or use illicit drugs. The patient denies ETOH, tobacco, or drug use.   Review of Systems: Constitutional: Weight is stable.  Eyes: No changes in vision. ENT: No oral lesions, sore throat.  GI: see HPI.  Heme/Lymph: No easy bruising.  CV: No chest pain.  GU: No hematuria.  Integumentary: No rashes.  Neuro: No headaches.  Psych: No depression/anxiety.  Endocrine: No heat/cold intolerance.  Allergic/Immunologic: No urticaria.  Resp: No cough, SOB.  Musculoskeletal: No joint swelling.    Physical Examination: BP 103/58 mmHg  Pulse 83  Temp(Src) 98.6 F (37 C) (Oral)  Resp 16  Ht 5\' 5"  (1.651 m)  Wt 73.664 kg (162 lb 6.4 oz)  BMI 27.02 kg/m2  SpO2 96% Gen: NAD, alert and oriented x 4 HEENT: PEERLA, EOMI, Neck: supple, no JVD or thyromegaly Chest: CTA bilaterally, no wheezes, crackles, or other adventitious sounds CV: RRR, no m/g/c/r Abd: soft, RUQ tenderness. JP drain in place with cloudy bile, abdominal distension, +BS in all four quadrants; no HSM, guarding, ridigity, or rebound tenderness Ext: no edema, well perfused with 2+ pulses, Skin: no rash or lesions noted Lymph: no LAD  Data: Lab Results  Component Value Date   WBC 16.1* 01/04/2015   HGB 10.8* 01/04/2015   HCT 33.7* 01/04/2015   MCV 83.9 01/04/2015   PLT 293 01/04/2015    Recent Labs Lab 01/02/15 1110 01/03/15 1015 01/04/15 0701  HGB 12.0* 11.2* 10.8*   Lab Results  Component Value Date   NA 131* 01/04/2015   K 3.5 01/04/2015   CL 96* 01/04/2015   CO2 28 01/04/2015    BUN 37* 01/04/2015   CREATININE 2.15* 01/04/2015   Lab Results  Component Value Date   ALT 13* 01/04/2015   AST 16 01/04/2015   ALKPHOS 81 01/04/2015   BILITOT 0.6 01/04/2015    Recent Labs Lab 01/04/15 0942  INR 1.24   Assessment/Plan: [name] is a 74 y.o. male with bile leak/ abscess with distal CBD obstruction from CBD stone.   Recommendations: NPO. ERCP this afternoon. Discussed procedure in detail with pt and family. Discussed potential risks, incl failure to cannulate CBD, bleeding from sphincterotomy, effects on heart and lung due to anesthesia, risks of pancreatitis, etc. Pt and family agreed. Pt already on Abx. Thank you for the consult. Please call with questions or concerns.  Gust Eugene, Lupita Dawn, MD

## 2015-01-05 NOTE — Progress Notes (Signed)
Patient ID: Bobby Peterson, male   DOB: 02-16-1941, 74 y.o.   MRN: [id number] Patient underwent percutaneous drainage of the abscess in the gallbladder bed yesterday. Initially about 100 mL of what looked like material was drained out. Also some bile was draining at the time and it appears that he has had a fair amount of bilious drainage overnight. Patient not more significant complains on fact that he would like to eat some solid food. His vital signs are stable and he is afebrile. Abdomen is soft with the no focal tenderness at this time except around the catheter site. The catheter drainage his bili is at this time. Impression subhepatic abscess from the gallbladder bed with associated bile leak. This has been adequately drained and is under control. Patient does need ERCP for removal of the stones and stent placement in view of the bile leak. I have asked for consult from Dr. Verdie Shire- GI service.

## 2015-01-06 LAB — COMPREHENSIVE METABOLIC PANEL
ALT: 10 U/L — ABNORMAL LOW (ref 17–63)
AST: 13 U/L — ABNORMAL LOW (ref 15–41)
Albumin: 2.1 g/dL — ABNORMAL LOW (ref 3.5–5.0)
Alkaline Phosphatase: 68 U/L (ref 38–126)
Anion gap: 8 (ref 5–15)
BILIRUBIN TOTAL: 0.3 mg/dL (ref 0.3–1.2)
BUN: 16 mg/dL (ref 6–20)
CO2: 29 mmol/L (ref 22–32)
Calcium: 7.9 mg/dL — ABNORMAL LOW (ref 8.9–10.3)
Chloride: 102 mmol/L (ref 101–111)
Creatinine, Ser: 1.41 mg/dL — ABNORMAL HIGH (ref 0.61–1.24)
GFR calc Af Amer: 54 mL/min — ABNORMAL LOW (ref >60)
GFR calc non Af Amer: 47 mL/min — ABNORMAL LOW (ref >60)
Glucose, Bld: 119 mg/dL — ABNORMAL HIGH (ref 65–99)
Potassium: 3.4 mmol/L — ABNORMAL LOW (ref 3.5–5.1)
Sodium: 139 mmol/L (ref 135–145)
Total Protein: 5.7 g/dL — ABNORMAL LOW (ref 6.5–8.1)

## 2015-01-06 LAB — CBC WITH DIFFERENTIAL/PLATELET
BASOS PCT: 0 %
Basophils Absolute: 0 10*3/uL (ref 0–0.1)
EOS PCT: 3 %
Eosinophils Absolute: 0.4 10*3/uL (ref 0–0.7)
HEMATOCRIT: 32.4 % — AB (ref 40.0–52.0)
HEMOGLOBIN: 10.4 g/dL — AB (ref 13.0–18.0)
LYMPHS ABS: 1.1 10*3/uL (ref 1.0–3.6)
LYMPHS PCT: 8 %
MCH: 26.9 pg (ref 26.0–34.0)
MCHC: 32 g/dL (ref 32.0–36.0)
MCV: 84.1 fL (ref 80.0–100.0)
MONOS PCT: 6 %
Monocytes Absolute: 0.9 10*3/uL (ref 0.2–1.0)
NEUTROS ABS: 11.8 10*3/uL — AB (ref 1.4–6.5)
Neutrophils Relative %: 83 %
PLATELETS: 363 10*3/uL (ref 150–440)
RBC: 3.85 MIL/uL — ABNORMAL LOW (ref 4.40–5.90)
RDW: 15.6 % — ABNORMAL HIGH (ref 11.5–14.5)
WBC: 14.1 10*3/uL — ABNORMAL HIGH (ref 3.8–10.6)

## 2015-01-06 LAB — C DIFFICILE QUICK SCREEN W PCR REFLEX
C DIFFICILE (CDIFF) TOXIN: NEGATIVE
C Diff antigen: NEGATIVE
C Diff interpretation: NEGATIVE

## 2015-01-06 LAB — LIPASE, BLOOD: LIPASE: 51 U/L (ref 22–51)

## 2015-01-06 MED ORDER — DEXTROSE-NACL 5-0.45 % IV SOLN
INTRAVENOUS | Status: DC
  Administered 2015-01-06: 13:00:00 via INTRAVENOUS

## 2015-01-06 MED ORDER — SODIUM CHLORIDE 0.9 % IJ SOLN
5.0000 mL | Freq: Three times a day (TID) | INTRAMUSCULAR | Status: DC
  Administered 2015-01-06 – 2015-01-07 (×5): 5 mL

## 2015-01-06 NOTE — Progress Notes (Signed)
Patient ID: Bobby Peterson, male   DOB: 28-Nov-1940, 74 y.o.   MRN: [id number] Patient appears to be feeling much better today. Successful ERCP yesterday with retrieval of stones and sludge and subsequent placement of stent. No apparent bile leak was noted on the cholangiogram. He is afebrile and vital signs are stable after a brief episode of sinus tachycardia yesterday. Abdomen is soft with good bowel sounds. Catheter is intact and functioning well. The drainage appears to be bile stained drained over 200 yesterday. Culture is pending but the showing heavy growth of a gram-negative rod. Impression patient appears to be doing very well at this point. Plan: We'll advance his diet anticipate discharge in 1-2 days on oral antibodies. Catheter to remain until the drainage amount decreases to minimal.

## 2015-01-07 ENCOUNTER — Encounter: Payer: Self-pay | Admitting: Gastroenterology

## 2015-01-07 ENCOUNTER — Other Ambulatory Visit: Payer: Self-pay | Admitting: General Surgery

## 2015-01-07 DIAGNOSIS — K8050 Calculus of bile duct without cholangitis or cholecystitis without obstruction: Secondary | ICD-10-CM

## 2015-01-07 MED ORDER — CIPROFLOXACIN HCL 500 MG PO TABS: 500.0000 mg | ORAL_TABLET | Freq: Two times a day (BID) | ORAL | Status: DC

## 2015-01-07 MED ORDER — LINEZOLID 600 MG PO TABS: 600.0000 mg | ORAL_TABLET | Freq: Two times a day (BID) | ORAL | Status: DC

## 2015-01-07 NOTE — Consult Note (Signed)
  GI Inpatient Follow-up Note  Patient Identification: SAMER DUTTON is a 74 y.o. male with RUQ abscess with CBD stones, which were extracted.   Subjective: Overall better. Minimal bile drainage via JP drain now. Minimal abdominal pain. WBC coming down as well.  Scheduled Inpatient Medications:  . aspirin EC  81 mg Oral Daily  . furosemide  40 mg Oral Daily  . gabapentin  300 mg Oral BID  . losartan  50 mg Oral Daily  . metoprolol succinate  50 mg Oral Daily  . mometasone-formoterol  2 puff Inhalation BID  . pantoprazole  40 mg Oral Daily  . piperacillin-tazobactam (ZOSYN)  IV  3.375 g Intravenous 3 times per day  . pravastatin  20 mg Oral Daily  . sodium chloride  5 mL Intracatheter Q8H  . tamsulosin  0.4 mg Oral Daily    Continuous Inpatient Infusions:   . dextrose 5 % and 0.45% NaCl 50 mL/hr at 01/06/15 1316    PRN Inpatient Medications:  acetaminophen, morphine injection, ondansetron (ZOFRAN) IV, oxyCODONE-acetaminophen  Review of Systems: Constitutional: Weight is stable.  Eyes: No changes in vision. ENT: No oral lesions, sore throat.  GI: see HPI.  Heme/Lymph: No easy bruising.  CV: No chest pain.  GU: No hematuria.  Integumentary: No rashes.  Neuro: No headaches.  Psych: No depression/anxiety.  Endocrine: No heat/cold intolerance.  Allergic/Immunologic: No urticaria.  Resp: No cough, SOB.  Musculoskeletal: No joint swelling.    Physical Examination: BP 127/67 mmHg  Pulse 73  Temp(Src) 98.4 F (36.9 C) (Oral)  Resp 16  Ht 5\' 5"  (1.651 m)  Wt 73.664 kg (162 lb 6.4 oz)  BMI 27.02 kg/m2  SpO2 99% Gen: NAD, alert and oriented x 4 HEENT: PEERLA, EOMI, Neck: supple, no JVD or thyromegaly Chest: CTA bilaterally, no wheezes, crackles, or other adventitious sounds CV: RRR, no m/g/c/r Abd: soft, drain in place with minimal bile. Minimal tenderness, ND, +BS in all four quadrants; no HSM, guarding, ridigity, or rebound tenderness Ext: no edema, well perfused  with 2+ pulses, Skin: no rash or lesions noted Lymph: no LAD  Data: Lab Results  Component Value Date   WBC 14.1* 01/06/2015   HGB 10.4* 01/06/2015   HCT 32.4* 01/06/2015   MCV 84.1 01/06/2015   PLT 363 01/06/2015    Recent Labs Lab 01/03/15 1015 01/04/15 0701 01/06/15 0456  HGB 11.2* 10.8* 10.4*   Lab Results  Component Value Date   NA 139 01/06/2015   K 3.4* 01/06/2015   CL 102 01/06/2015   CO2 29 01/06/2015   BUN 16 01/06/2015   CREATININE 1.41* 01/06/2015   Lab Results  Component Value Date   ALT 10* 01/06/2015   AST 13* 01/06/2015   ALKPHOS 68 01/06/2015   BILITOT 0.3 01/06/2015    Recent Labs Lab 01/04/15 0942  INR 1.24   Assessment/Plan: Mr. Heinzman is a 74 y.o. male with abscess, bile leak, and CBD stones. Doing much better.    Recommendations: Agree with discharge today on oral Abx. Will sign off. Can f/u in GI in few weeks. Will arrange outpatient ERCP in a month or so from today when he f/u in office. Thanks. Please call with questions or concerns.  Tela Kotecki, Lupita Dawn, MD

## 2015-01-07 NOTE — Discharge Summary (Signed)
  Discharge summary   Date of admission 01/03/2015 Date of discharge 01/07/2015  Discharge diagnosis:1. Subhepatic abscess                                    2. Choledocholithiasis  Procedures performed: Percutaneous drainage of right upper quadrant abscess. ERCP with stent placement.  Hospital course: This 74 year old male underwent laparoscopic cholecystectomy on the 12/22/2014. He had been doing fairly well postoperatively until a week later when he started to have some nausea and vomiting and felt poorly. Patient was seen and evaluated he was known to have a common duct stone for which an ERCP was planned as an elective procedure. MRI showed presence of gallstones but also suggestion of an abscess in the gallbladder bed area. His white count was markedly elevated at 38,000. He was therefore admitted to the hospital started on IV Zosyn. A CT scan of the abdomen was performed confirming the presence of the abscess no other significant findings were noted. Percutaneous drainage of the abscess was performed on 01/04/2015. On 01/06/2015 the patient underwent ERCP with removal of stones and sludge and a stent was placed. Patient had a brief episode of sinus tachycardia following the ERCP but then settled down to normal rate. Currently he is feeling much better and is tolerating oral intake. Culture from the abdominal drainage had showed Citrobacter and based on sensitivity he'll be sent home on Cipro. The catheter did seem some bile but the amount has decreased significantly in the last 24 hours. He'll be discharged home with instructions on drain care and on oral Cipro for 7 days. Outpatient follow-up in one week.

## 2015-01-07 NOTE — Progress Notes (Signed)
A&O. VSS. Tolerating diet well. No complaints of pain or nausea. Resting comfortably. Wife at bedside. Attentive to pt. Wife educated on proper technique of flushing JP drain and keeping record of amount of output. Prescription given to pt and other prescription called into [place]. IV removed per policy. Discharged on home O2 via wheelchair escorted by auxilary.

## 2015-01-08 ENCOUNTER — Encounter: Payer: Self-pay | Admitting: Gastroenterology

## 2015-01-08 LAB — CULTURE, ROUTINE-ABSCESS: Special Requests: NORMAL

## 2015-01-08 LAB — ANAEROBIC CULTURE: Special Requests: NORMAL

## 2015-01-12 ENCOUNTER — Ambulatory Visit
Admission: RE | Admit: 2015-01-12 | Discharge: 2015-01-12 | Disposition: A | Discharge status: Home / Self Care | Payer: Commercial Managed Care - HMO | Source: Ambulatory Visit | Attending: General Surgery | Admitting: General Surgery

## 2015-01-12 ENCOUNTER — Ambulatory Visit (INDEPENDENT_AMBULATORY_CARE_PROVIDER_SITE_OTHER): Payer: Commercial Managed Care - HMO | Admitting: General Surgery

## 2015-01-12 ENCOUNTER — Encounter: Payer: Self-pay | Admitting: General Surgery

## 2015-01-12 VITALS — BP 126/68 | HR 64 | Resp 14

## 2015-01-12 DIAGNOSIS — IMO0001 Reserved for inherently not codable concepts without codable children: Secondary | ICD-10-CM

## 2015-01-12 DIAGNOSIS — Z9889 Other specified postprocedural states: Secondary | ICD-10-CM | POA: Diagnosis not present

## 2015-01-12 DIAGNOSIS — T814XXA Infection following a procedure, initial encounter: Secondary | ICD-10-CM | POA: Diagnosis not present

## 2015-01-12 DIAGNOSIS — K8050 Calculus of bile duct without cholangitis or cholecystitis without obstruction: Secondary | ICD-10-CM | POA: Insufficient documentation

## 2015-01-12 DIAGNOSIS — T814XXD Infection following a procedure, subsequent encounter: Principal | ICD-10-CM

## 2015-01-12 DIAGNOSIS — K651 Peritoneal abscess: Secondary | ICD-10-CM | POA: Diagnosis not present

## 2015-01-12 DIAGNOSIS — K449 Diaphragmatic hernia without obstruction or gangrene: Secondary | ICD-10-CM | POA: Insufficient documentation

## 2015-01-12 DIAGNOSIS — N200 Calculus of kidney: Secondary | ICD-10-CM | POA: Diagnosis not present

## 2015-01-12 DIAGNOSIS — K5732 Diverticulitis of large intestine without perforation or abscess without bleeding: Secondary | ICD-10-CM | POA: Diagnosis not present

## 2015-01-12 NOTE — Patient Instructions (Signed)
Follow up based on CT results. Most likely next week.

## 2015-01-12 NOTE — Progress Notes (Signed)
Patient here for post op follow up of laparoscopy and cholecystectomy and repair off incarcerated umbilical hernia. Patient states he is doing well. He is still taking pain medication when it hurts but not every day. Patient still taking antibiotics states he finishes taking them this week. Drain sheet present. Patient states that his mouth feels dry.  Patient was admitted to [facility] following last outpatient visit on 01/02/2015. Had abscess in the gallbladder bed that was drained percutaneously. Also had ERCP and stent placement.  Cultures from the abscess grew multiple organisms including VRE. Patient is completing a course of Cipro and Zyvox.   Exam: abdomen soft with normoactive bowel sounds. Drain intact. Mouth is dry and mildly coated. Drainage appears to be minimal up to about 10 cc each day for the last 3 days. Still has a slight bile tinge.   Plan: Rx fungal mouthwash for dry mouth.  Discussed with radiologist. Order CT without contrast of abdomen to assess abdominal abscess in gallbladder bed.  Follow up: based on CT results will decide on removing drain. 1 week follow up  Patient going to have a CT today at [facility], he will go to the registration desk and they will have him start his prep. Patient is aware of instructions.

## 2015-01-14 DIAGNOSIS — J449 Chronic obstructive pulmonary disease, unspecified: Secondary | ICD-10-CM | POA: Diagnosis not present

## 2015-01-15 DIAGNOSIS — J449 Chronic obstructive pulmonary disease, unspecified: Secondary | ICD-10-CM | POA: Diagnosis not present

## 2015-01-19 ENCOUNTER — Other Ambulatory Visit: Payer: Self-pay | Admitting: Family Medicine

## 2015-01-19 ENCOUNTER — Encounter: Payer: Self-pay | Admitting: Family Medicine

## 2015-01-19 DIAGNOSIS — N200 Calculus of kidney: Secondary | ICD-10-CM

## 2015-01-20 ENCOUNTER — Encounter: Payer: Self-pay | Admitting: General Surgery

## 2015-01-20 ENCOUNTER — Ambulatory Visit (INDEPENDENT_AMBULATORY_CARE_PROVIDER_SITE_OTHER): Payer: Commercial Managed Care - HMO | Admitting: General Surgery

## 2015-01-20 VITALS — BP 116/58 | HR 102 | Resp 18 | Ht 68.0 in | Wt 161.0 lb

## 2015-01-20 DIAGNOSIS — T814XXD Infection following a procedure, subsequent encounter: Principal | ICD-10-CM

## 2015-01-20 DIAGNOSIS — K651 Peritoneal abscess: Secondary | ICD-10-CM

## 2015-01-20 DIAGNOSIS — IMO0001 Reserved for inherently not codable concepts without codable children: Secondary | ICD-10-CM

## 2015-01-20 NOTE — Progress Notes (Signed)
  Patient here for post op follow up of laparoscopy and cholecystectomy and repair off incarcerated umbilical hernia. Patient states he is doing well. Post procedure he developed an abscess in the gallbladder bed, which was drained percutaneously and after he had ERCP with stent placement for common duct stones.  Drainage has been almost nil the last several days. CT done last week showed no residual fluid collection under the liver.  Removed drain. Patient has follow up with Dr. Candace Cruise for removal of stent.   Follow up: 1 month

## 2015-01-20 NOTE — Patient Instructions (Signed)
Follow up in one month.

## 2015-01-21 DIAGNOSIS — K8050 Calculus of bile duct without cholangitis or cholecystitis without obstruction: Secondary | ICD-10-CM | POA: Diagnosis not present

## 2015-02-13 DIAGNOSIS — J449 Chronic obstructive pulmonary disease, unspecified: Secondary | ICD-10-CM | POA: Diagnosis not present

## 2015-02-14 DIAGNOSIS — J449 Chronic obstructive pulmonary disease, unspecified: Secondary | ICD-10-CM | POA: Diagnosis not present

## 2015-02-16 ENCOUNTER — Ambulatory Visit (INDEPENDENT_AMBULATORY_CARE_PROVIDER_SITE_OTHER): Payer: Commercial Managed Care - HMO | Admitting: General Surgery

## 2015-02-16 ENCOUNTER — Encounter: Payer: Self-pay | Admitting: General Surgery

## 2015-02-16 VITALS — BP 140/78 | HR 88 | Resp 18 | Ht 68.0 in | Wt 174.0 lb

## 2015-02-16 DIAGNOSIS — IMO0001 Reserved for inherently not codable concepts without codable children: Secondary | ICD-10-CM

## 2015-02-16 DIAGNOSIS — K651 Peritoneal abscess: Secondary | ICD-10-CM

## 2015-02-16 DIAGNOSIS — T814XXD Infection following a procedure, subsequent encounter: Principal | ICD-10-CM

## 2015-02-16 NOTE — Patient Instructions (Signed)
Patient to return as needed. 

## 2015-02-16 NOTE — Progress Notes (Signed)
This is a 74 year old male here today for his post op abdominal abscess done on 02/19/15. Initially had lap cholecystectomy, had CBD stones.Abscess was in subhepatic space-drained percutaneously. ERCP and stent placed.  Pt with no symptoms now. Feels he is back to normal. Sclera-no icterus. Abdomen-well healed portsites. Soft and nontender. Lungs clear.  Recovered well post surgery post drainage abscess. He is scheduled for stent removal by Dr. Candace Cruise on 02/19/15. Pt to return here as needed

## 2015-02-17 ENCOUNTER — Encounter: Payer: Self-pay | Admitting: General Surgery

## 2015-02-18 ENCOUNTER — Encounter: Payer: Self-pay | Admitting: *Deleted

## 2015-02-19 ENCOUNTER — Ambulatory Visit: Payer: Commercial Managed Care - HMO | Admitting: Anesthesiology

## 2015-02-19 ENCOUNTER — Ambulatory Visit: Payer: Commercial Managed Care - HMO

## 2015-02-19 ENCOUNTER — Encounter: Payer: Self-pay | Admitting: *Deleted

## 2015-02-19 ENCOUNTER — Encounter
Admission: RE | Disposition: A | Discharge status: Home / Self Care | Payer: Self-pay | Source: Ambulatory Visit | Attending: Gastroenterology

## 2015-02-19 ENCOUNTER — Ambulatory Visit
Admission: RE | Admit: 2015-02-19 | Discharge: 2015-02-19 | Disposition: A | Discharge status: Home / Self Care | Payer: Commercial Managed Care - HMO | Source: Ambulatory Visit | Attending: Gastroenterology | Admitting: Gastroenterology

## 2015-02-19 DIAGNOSIS — N289 Disorder of kidney and ureter, unspecified: Secondary | ICD-10-CM | POA: Insufficient documentation

## 2015-02-19 DIAGNOSIS — Z4659 Encounter for fitting and adjustment of other gastrointestinal appliance and device: Secondary | ICD-10-CM | POA: Diagnosis not present

## 2015-02-19 DIAGNOSIS — K838 Other specified diseases of biliary tract: Secondary | ICD-10-CM | POA: Diagnosis not present

## 2015-02-19 DIAGNOSIS — E669 Obesity, unspecified: Secondary | ICD-10-CM | POA: Insufficient documentation

## 2015-02-19 DIAGNOSIS — Z79899 Other long term (current) drug therapy: Secondary | ICD-10-CM | POA: Insufficient documentation

## 2015-02-19 DIAGNOSIS — Z7982 Long term (current) use of aspirin: Secondary | ICD-10-CM | POA: Insufficient documentation

## 2015-02-19 DIAGNOSIS — E119 Type 2 diabetes mellitus without complications: Secondary | ICD-10-CM | POA: Diagnosis not present

## 2015-02-19 DIAGNOSIS — Z9981 Dependence on supplemental oxygen: Secondary | ICD-10-CM | POA: Diagnosis not present

## 2015-02-19 DIAGNOSIS — Z4689 Encounter for fitting and adjustment of other specified devices: Secondary | ICD-10-CM | POA: Diagnosis not present

## 2015-02-19 DIAGNOSIS — I10 Essential (primary) hypertension: Secondary | ICD-10-CM | POA: Diagnosis not present

## 2015-02-19 DIAGNOSIS — K8050 Calculus of bile duct without cholangitis or cholecystitis without obstruction: Secondary | ICD-10-CM | POA: Diagnosis not present

## 2015-02-19 DIAGNOSIS — J449 Chronic obstructive pulmonary disease, unspecified: Secondary | ICD-10-CM | POA: Diagnosis not present

## 2015-02-19 DIAGNOSIS — E109 Type 1 diabetes mellitus without complications: Secondary | ICD-10-CM | POA: Diagnosis not present

## 2015-02-19 HISTORY — PX: ERCP: SHX5425

## 2015-02-19 SURGERY — ERCP, WITH INTERVENTION IF INDICATED
Anesthesia: General

## 2015-02-19 MED ORDER — LIDOCAINE HCL (CARDIAC) 20 MG/ML IV SOLN
INTRAVENOUS | Status: DC | PRN
  Administered 2015-02-19: 30 mg via INTRAVENOUS

## 2015-02-19 MED ORDER — ROCURONIUM BROMIDE 100 MG/10ML IV SOLN
INTRAVENOUS | Status: DC | PRN
  Administered 2015-02-19: 30 mg via INTRAVENOUS

## 2015-02-19 MED ORDER — NEOSTIGMINE METHYLSULFATE 10 MG/10ML IV SOLN
INTRAVENOUS | Status: DC | PRN
  Administered 2015-02-19: 3 mg via INTRAVENOUS

## 2015-02-19 MED ORDER — PROPOFOL 10 MG/ML IV BOLUS
INTRAVENOUS | Status: DC | PRN
  Administered 2015-02-19: 100 mg via INTRAVENOUS

## 2015-02-19 MED ORDER — GLYCOPYRROLATE 0.2 MG/ML IJ SOLN
INTRAMUSCULAR | Status: DC | PRN
  Administered 2015-02-19: 0.6 mg via INTRAVENOUS

## 2015-02-19 MED ORDER — FENTANYL CITRATE (PF) 100 MCG/2ML IJ SOLN
INTRAMUSCULAR | Status: DC | PRN
  Administered 2015-02-19: 50 ug via INTRAVENOUS

## 2015-02-19 MED ORDER — ONDANSETRON HCL 4 MG/2ML IJ SOLN: 4.0000 mg | Freq: Once | INTRAMUSCULAR | Status: DC | PRN

## 2015-02-19 MED ORDER — FENTANYL CITRATE (PF) 100 MCG/2ML IJ SOLN: 25.0000 ug | INTRAMUSCULAR | Status: DC | PRN

## 2015-02-19 MED ORDER — EPHEDRINE SULFATE 50 MG/ML IJ SOLN
INTRAMUSCULAR | Status: DC | PRN
  Administered 2015-02-19: 10 mg via INTRAVENOUS

## 2015-02-19 MED ORDER — MIDAZOLAM HCL 2 MG/2ML IJ SOLN
INTRAMUSCULAR | Status: DC | PRN
  Administered 2015-02-19: 1 mg via INTRAVENOUS

## 2015-02-19 MED ORDER — SODIUM CHLORIDE 0.9 % IV SOLN
INTRAVENOUS | Status: DC
  Administered 2015-02-19 (×2): via INTRAVENOUS

## 2015-02-19 MED ORDER — CEFAZOLIN SODIUM 1-5 GM-% IV SOLN
1.0000 g | Freq: Once | INTRAVENOUS | Status: AC
  Administered 2015-02-19 (×2): 1 g via INTRAVENOUS
  Filled 2015-02-19 (×2): qty 50

## 2015-02-19 NOTE — H&P (Signed)
  Date of Initial H&P: 01/21/2015  History reviewed, patient examined, no change in status, stable for surgery.

## 2015-02-19 NOTE — Anesthesia Procedure Notes (Signed)
Procedure Name: Intubation Date/Time: 02/19/2015 1:45 PM Performed by: Courtney Paris Pre-anesthesia Checklist: Patient identified, Emergency Drugs available, Suction available, Patient being monitored and Timeout performed Patient Re-evaluated:Patient Re-evaluated prior to inductionOxygen Delivery Method: Circle system utilized Preoxygenation: Pre-oxygenation with 100% oxygen Intubation Type: Combination inhalational/ intravenous induction Ventilation: Mask ventilation without difficulty Laryngoscope Size: Mac and 4 Grade View: Grade II Tube type: Oral Tube size: 7.5 mm Number of attempts: 1 Airway Equipment and Method: Patient positioned with wedge pillow and Stylet Placement Confirmation: ETT inserted through vocal cords under direct vision,  positive ETCO2,  CO2 detector and breath sounds checked- equal and bilateral Secured at: 21 cm Tube secured with: Tape Dental Injury: Teeth and Oropharynx as per pre-operative assessment

## 2015-02-19 NOTE — Anesthesia Postprocedure Evaluation (Signed)
  Anesthesia Post-op Note  Patient: Bobby Peterson  Procedure(s) Performed: Procedure(s): ENDOSCOPIC RETROGRADE CHOLANGIOPANCREATOGRAPHY (ERCP) (N/A)  Anesthesia type:General  Patient location: PACU  Post pain: Pain level controlled  Post assessment: Post-op Vital signs reviewed, Patient's Cardiovascular Status Stable, Respiratory Function Stable, Patent Airway and No signs of Nausea or vomiting  Post vital signs: Reviewed and stable  Last Vitals:  Filed Vitals:   02/19/15 1424  BP:   Pulse: 70  Temp: 37 C  Resp:     Level of consciousness: awake, alert  and patient cooperative  Complications: No apparent anesthesia complications

## 2015-02-19 NOTE — Transfer of Care (Signed)
Immediate Anesthesia Transfer of Care Note  Patient: Bobby Peterson  Procedure(s) Performed: Procedure(s): ENDOSCOPIC RETROGRADE CHOLANGIOPANCREATOGRAPHY (ERCP) (N/A)  Patient Location: PACU  Anesthesia Type:General  Level of Consciousness: awake, alert , oriented and patient cooperative  Airway & Oxygen Therapy: Patient Spontanous Breathing and Patient connected to nasal cannula oxygen  Post-op Assessment: Report given to RN and Post -op Vital signs reviewed and stable  Post vital signs: Reviewed and stable  Last Vitals:  Filed Vitals:   02/19/15 1424  BP:   Pulse: 70  Temp: 37 C  Resp:     Complications: No apparent anesthesia complications

## 2015-02-19 NOTE — OR Nursing (Signed)
Pt extubated by T. Marcille Blanco, Immunologist. Pt tolerated well.

## 2015-02-19 NOTE — Anesthesia Preprocedure Evaluation (Addendum)
Anesthesia Evaluation  Patient identified by MRN, date of birth, ID band Patient awake    Airway Mallampati: II       Dental  (+) Edentulous Lower, Edentulous Upper   Pulmonary COPD oxygen dependent, former smoker,    + decreased breath sounds      Cardiovascular Exercise Tolerance: Poor hypertension, Pt. on home beta blockers Rhythm:Irregular     Neuro/Psych negative neurological ROS  negative psych ROS   GI/Hepatic Neg liver ROS,   Endo/Other  diabetes, Type 2  Renal/GU Renal disease     Musculoskeletal   Abdominal (+) + obese,   Peds  Hematology   Anesthesia Other Findings   Reproductive/Obstetrics                            Anesthesia Physical Anesthesia Plan  ASA: III  Anesthesia Plan: General   Post-op Pain Management:    Induction: Intravenous  Airway Management Planned: Oral ETT  Additional Equipment:   Intra-op Plan:   Post-operative Plan:   Informed Consent: I have reviewed the patients History and Physical, chart, labs and discussed the procedure including the risks, benefits and alternatives for the proposed anesthesia with the patient or authorized representative who has indicated his/her understanding and acceptance.     Plan Discussed with: CRNA  Anesthesia Plan Comments:         Anesthesia Quick Evaluation

## 2015-02-19 NOTE — Op Note (Signed)
[facility] Gastroenterology Patient Name: Bobby Peterson Procedure Date: 02/19/2015 1:05 PM MRN: [id number] Account #: [id number] Date of Birth: [birth year] Admit Type: Outpatient Age: 74 Room: [facility] ENDO ROOM 4 Gender: Male Note Status: Finalized Procedure:         ERCP Indications:       Follow-up of possible bile leak, Stent removal, CBD stone                     removal on previous ERCP. Providers:         Lupita Dawn. Candace Cruise, MD Referring MD:      Kirstie Peri. Caryn Section, MD (Referring MD) Medicines:         General Anesthesia Complications:     No immediate complications. Procedure:         Pre-Anesthesia Assessment:                    - Prior to the procedure, a History and Physical was                     performed, and patient medications, allergies and                     sensitivities were reviewed. The patient's tolerance of                     previous anesthesia was reviewed.                    - The risks and benefits of the procedure and the sedation                     options and risks were discussed with the patient. All                     questions were answered and informed consent was obtained.                    - After reviewing the risks and benefits, the patient was                     deemed in satisfactory condition to undergo the procedure.                    After obtaining informed consent, the scope was passed                     under direct vision. Throughout the procedure, the                     patient's blood pressure, pulse, and oxygen saturations                     were monitored continuously. The Enteroscope was                     introduced through the mouth, and used to inject contrast                     into and used to inject contrast into the bile duct. The                     ERCP was accomplished without difficulty. The patient  tolerated the procedure well. Findings:      A biliary stent was visible on the  scout film. The esophagus was       successfully intubated under direct vision. The scope was advanced to a       normal major papilla in the descending duodenum without detailed       examination of the pharynx, larynx and associated structures, and upper       GI tract. The upper GI tract was grossly normal. Bilary stent protruding       from ampulla with good bile drainage. This stent was grabbed and removed       with a snare. Then, the bile duct was deeply cannulated with the       short-nosed traction sphincterotome. Contrast was injected. I personally       interpreted the bile duct images. Ductal flow of contrast was adequate.       Image quality was adequate. Contrast extended to the entire biliary       tree. The intra-hepatic and extra-hepatic biliary duct system was       normal. No extravasation of contrast.No obvious stone or sludge       remaining in the bile duct. A straight Roadrunner wire was passed into       the biliary tree. To discover objects, the biliary tree was swept with a       12 mm balloon starting at the bifurcation. Nothing was found. Impression:        - The cholangiogram was normal.                    - The biliary tree was swept and nothing was found.                    - No specimens collected.                    - Removal of biliary stent. Recommendation:    - Discharge patient to home.                    - Observe patient's clinical course.                    - The findings and recommendations were discussed with the                     patient's family. Procedure Code(s): --- Professional ---                    [id number], Endoscopic retrograde cholangiopancreatography                     (ERCP); diagnostic, including collection of specimen(s) by                     brushing or washing, when performed (separate procedure) Diagnosis Code(s): --- Professional ---                    L38.10, Encounter for fitting and adjustment of other                      gastrointestinal appliance and device                    K83.8, Other specified diseases of biliary tract CPT copyright 2014 American Medical Association. All rights  reserved. The codes documented in this report are preliminary and upon coder review may  be revised to meet current compliance requirements. Hulen Luster, MD 02/19/2015 2:08:55 PM This report has been signed electronically. Number of Addenda: 0 Note Initiated On: 02/19/2015 1:05 PM      [facility]

## 2015-02-19 NOTE — OR Nursing (Signed)
Pre-ercp intubation by Dr. Boston Service, assisted by T. Marcille Blanco, CRNA, without complication. See anesthesia notes.

## 2015-02-23 ENCOUNTER — Encounter: Payer: Self-pay | Admitting: Gastroenterology

## 2015-03-15 ENCOUNTER — Other Ambulatory Visit: Payer: Self-pay | Admitting: Family Medicine

## 2015-03-16 DIAGNOSIS — J449 Chronic obstructive pulmonary disease, unspecified: Secondary | ICD-10-CM | POA: Diagnosis not present

## 2015-03-17 DIAGNOSIS — J449 Chronic obstructive pulmonary disease, unspecified: Secondary | ICD-10-CM | POA: Diagnosis not present

## 2015-04-16 DIAGNOSIS — J449 Chronic obstructive pulmonary disease, unspecified: Secondary | ICD-10-CM | POA: Diagnosis not present

## 2015-04-17 DIAGNOSIS — J449 Chronic obstructive pulmonary disease, unspecified: Secondary | ICD-10-CM | POA: Diagnosis not present

## 2015-05-16 DIAGNOSIS — J449 Chronic obstructive pulmonary disease, unspecified: Secondary | ICD-10-CM | POA: Diagnosis not present

## 2015-05-17 DIAGNOSIS — J449 Chronic obstructive pulmonary disease, unspecified: Secondary | ICD-10-CM | POA: Diagnosis not present

## 2015-06-16 DIAGNOSIS — J449 Chronic obstructive pulmonary disease, unspecified: Secondary | ICD-10-CM | POA: Diagnosis not present

## 2015-06-17 DIAGNOSIS — J449 Chronic obstructive pulmonary disease, unspecified: Secondary | ICD-10-CM | POA: Diagnosis not present

## 2015-07-08 DIAGNOSIS — I5032 Chronic diastolic (congestive) heart failure: Secondary | ICD-10-CM

## 2015-07-08 DIAGNOSIS — N200 Calculus of kidney: Secondary | ICD-10-CM | POA: Insufficient documentation

## 2015-07-08 DIAGNOSIS — R339 Retention of urine, unspecified: Secondary | ICD-10-CM | POA: Insufficient documentation

## 2015-07-08 DIAGNOSIS — I509 Heart failure, unspecified: Secondary | ICD-10-CM | POA: Insufficient documentation

## 2015-07-08 DIAGNOSIS — J449 Chronic obstructive pulmonary disease, unspecified: Secondary | ICD-10-CM | POA: Insufficient documentation

## 2015-07-08 DIAGNOSIS — J309 Allergic rhinitis, unspecified: Secondary | ICD-10-CM | POA: Insufficient documentation

## 2015-07-08 DIAGNOSIS — K8020 Calculus of gallbladder without cholecystitis without obstruction: Secondary | ICD-10-CM | POA: Insufficient documentation

## 2015-07-08 DIAGNOSIS — E785 Hyperlipidemia, unspecified: Secondary | ICD-10-CM | POA: Insufficient documentation

## 2015-07-08 DIAGNOSIS — N400 Enlarged prostate without lower urinary tract symptoms: Secondary | ICD-10-CM

## 2015-07-08 DIAGNOSIS — N183 Chronic kidney disease, stage 3 unspecified: Secondary | ICD-10-CM | POA: Insufficient documentation

## 2015-07-08 DIAGNOSIS — R208 Other disturbances of skin sensation: Secondary | ICD-10-CM | POA: Insufficient documentation

## 2015-07-08 DIAGNOSIS — I10 Essential (primary) hypertension: Secondary | ICD-10-CM

## 2015-07-08 DIAGNOSIS — I152 Hypertension secondary to endocrine disorders: Secondary | ICD-10-CM | POA: Insufficient documentation

## 2015-07-08 DIAGNOSIS — K219 Gastro-esophageal reflux disease without esophagitis: Secondary | ICD-10-CM | POA: Insufficient documentation

## 2015-07-08 DIAGNOSIS — R17 Unspecified jaundice: Secondary | ICD-10-CM | POA: Insufficient documentation

## 2015-07-08 DIAGNOSIS — I2510 Atherosclerotic heart disease of native coronary artery without angina pectoris: Secondary | ICD-10-CM

## 2015-07-08 DIAGNOSIS — E1169 Type 2 diabetes mellitus with other specified complication: Secondary | ICD-10-CM | POA: Insufficient documentation

## 2015-07-08 DIAGNOSIS — R270 Ataxia, unspecified: Secondary | ICD-10-CM | POA: Insufficient documentation

## 2015-07-08 DIAGNOSIS — R739 Hyperglycemia, unspecified: Secondary | ICD-10-CM | POA: Insufficient documentation

## 2015-07-08 DIAGNOSIS — E1159 Type 2 diabetes mellitus with other circulatory complications: Secondary | ICD-10-CM | POA: Insufficient documentation

## 2015-07-08 HISTORY — DX: Benign prostatic hyperplasia without lower urinary tract symptoms: N40.0

## 2015-07-08 HISTORY — DX: Chronic diastolic (congestive) heart failure: I50.32

## 2015-07-08 HISTORY — DX: Atherosclerotic heart disease of native coronary artery without angina pectoris: I25.10

## 2015-07-08 HISTORY — DX: Essential (primary) hypertension: I10

## 2015-07-08 HISTORY — DX: Gastro-esophageal reflux disease without esophagitis: K21.9

## 2015-07-09 ENCOUNTER — Encounter: Payer: Self-pay | Admitting: Family Medicine

## 2015-07-09 ENCOUNTER — Ambulatory Visit (INDEPENDENT_AMBULATORY_CARE_PROVIDER_SITE_OTHER): Payer: Commercial Managed Care - HMO | Admitting: Family Medicine

## 2015-07-09 DIAGNOSIS — J441 Chronic obstructive pulmonary disease with (acute) exacerbation: Secondary | ICD-10-CM | POA: Diagnosis not present

## 2015-07-09 DIAGNOSIS — J449 Chronic obstructive pulmonary disease, unspecified: Secondary | ICD-10-CM | POA: Diagnosis not present

## 2015-07-09 MED ORDER — DOXYCYCLINE HYCLATE 100 MG PO TABS: 100.0000 mg | ORAL_TABLET | Freq: Two times a day (BID) | ORAL | Status: DC

## 2015-07-09 NOTE — Progress Notes (Signed)
Subjective:     Patient ID: Bobby Peterson, male   DOB: 01-09-1941, 74 y.o.   MRN: [id number]  HPI  Chief Complaint  Patient presents with  . Cough    Patient comes in office today accompanied by his wife with concerns of cough and congestion since 12/4. Patient reports cough is productive of mucous and complains of post nasal drip and runny nose. Yesterday patient ran a fever high of 100.7 and this morning it was 99 at home. Wife reports that patient has taken otc Tylenol  States his sinus congestion is watery. Sputum is thick and white. He continues to use oxygen @ 2liter via n.c most of the time. Unsure whether he is more short of breath. Currently using Advair just once daily out of $ concerns. Accompanied by his wife today.   Review of Systems  Gastrointestinal: Negative for abdominal pain.  Genitourinary:       No nocturia.       Objective:   Physical Exam  Constitutional: He appears well-developed and well-nourished. No distress.  Ears: T.M's intact without inflammation Throat: no tonsillar enlargement or exudate Neck: no cervical adenopathy Lungs: posterior expiratory wheezes    Assessment:    1. COPD exacerbation (HCC) - doxycycline (VIBRA-TABS) 100 MG tablet; Take 1 tablet (100 mg total) by mouth 2 (two) times daily.  Dispense: 20 tablet; Refill: 0    Plan:    Discussed use of Mucinex and increasing Advair dose to twice daily (sample x one provided).

## 2015-07-09 NOTE — Patient Instructions (Signed)
Add Mucinex to loosen sputum and make it easier to get sputum up

## 2015-07-16 DIAGNOSIS — J449 Chronic obstructive pulmonary disease, unspecified: Secondary | ICD-10-CM | POA: Diagnosis not present

## 2015-07-17 DIAGNOSIS — J449 Chronic obstructive pulmonary disease, unspecified: Secondary | ICD-10-CM | POA: Diagnosis not present

## 2015-08-04 ENCOUNTER — Other Ambulatory Visit: Payer: Self-pay | Admitting: Family Medicine

## 2015-08-04 ENCOUNTER — Telehealth: Payer: Self-pay | Admitting: Family Medicine

## 2015-08-04 DIAGNOSIS — J449 Chronic obstructive pulmonary disease, unspecified: Secondary | ICD-10-CM

## 2015-08-04 DIAGNOSIS — K219 Gastro-esophageal reflux disease without esophagitis: Secondary | ICD-10-CM

## 2015-08-04 DIAGNOSIS — R202 Paresthesia of skin: Secondary | ICD-10-CM

## 2015-08-04 DIAGNOSIS — I5032 Chronic diastolic (congestive) heart failure: Secondary | ICD-10-CM

## 2015-08-04 DIAGNOSIS — N400 Enlarged prostate without lower urinary tract symptoms: Secondary | ICD-10-CM

## 2015-08-04 DIAGNOSIS — I10 Essential (primary) hypertension: Secondary | ICD-10-CM

## 2015-08-04 DIAGNOSIS — R42 Dizziness and giddiness: Secondary | ICD-10-CM

## 2015-08-04 DIAGNOSIS — E785 Hyperlipidemia, unspecified: Secondary | ICD-10-CM

## 2015-08-04 MED ORDER — FUROSEMIDE 40 MG PO TABS: 40.0000 mg | ORAL_TABLET | Freq: Every day | ORAL | Status: DC

## 2015-08-04 MED ORDER — OMEPRAZOLE 40 MG PO CPDR
40.0000 mg | DELAYED_RELEASE_CAPSULE | Freq: Every day | ORAL | Status: DC
Start: 2015-08-04 — End: 2016-04-21

## 2015-08-04 MED ORDER — GABAPENTIN 300 MG PO CAPS: 300.0000 mg | ORAL_CAPSULE | Freq: Two times a day (BID) | ORAL | Status: DC

## 2015-08-04 MED ORDER — PRAVASTATIN SODIUM 20 MG PO TABS: 20.0000 mg | ORAL_TABLET | Freq: Every day | ORAL | Status: DC

## 2015-08-04 MED ORDER — FLUTICASONE-SALMETEROL 250-50 MCG/DOSE IN AEPB: 1.0000 | INHALATION_SPRAY | Freq: Two times a day (BID) | RESPIRATORY_TRACT | Status: DC

## 2015-08-04 MED ORDER — TAMSULOSIN HCL 0.4 MG PO CAPS: 0.4000 mg | ORAL_CAPSULE | Freq: Every day | ORAL | Status: DC

## 2015-08-04 MED ORDER — METOPROLOL SUCCINATE ER 50 MG PO TB24: 50.0000 mg | ORAL_TABLET | Freq: Every day | ORAL | Status: DC

## 2015-08-04 NOTE — Telephone Encounter (Signed)
Pt's wife Bobby Peterson is also requesting refill for Advair Diskus 250/50 be sent into [place], [facility], [address].Call back # [phone]

## 2015-08-04 NOTE — Telephone Encounter (Signed)
Pt called and request the following refills  omeprazole (PRILOSEC) 40 MG capsule, omeprazole (PRILOSEC) 40 MG capsule,furosemide (LASIX) 40 MG tablet,metoprolol succinate (TOPROL-XL) 50 MG 24 hr tablet.pravastatin (PRAVACHOL) 20 MG tablet, gabapentin (NEURONTIN) 300 MG capsule, tamsulosin (FLOMAX) 0.4 MG CAPS capsule, called [place], [address]

## 2015-08-05 ENCOUNTER — Other Ambulatory Visit: Payer: Self-pay | Admitting: Family Medicine

## 2015-08-05 NOTE — Telephone Encounter (Signed)
Mrs. Shillington called back adding a telephone number for [facility] for the Prescriptions  [phone] option 1  She says he needs all of his Rx's .  [name]

## 2015-08-16 DIAGNOSIS — J449 Chronic obstructive pulmonary disease, unspecified: Secondary | ICD-10-CM | POA: Diagnosis not present

## 2015-08-17 DIAGNOSIS — J449 Chronic obstructive pulmonary disease, unspecified: Secondary | ICD-10-CM | POA: Diagnosis not present

## 2015-08-19 NOTE — Telephone Encounter (Signed)
Pt wife called stating the pharmacy has not rec'd this Rx.  CB#[phone]/MW

## 2015-08-20 ENCOUNTER — Other Ambulatory Visit: Payer: Self-pay | Admitting: Family Medicine

## 2015-08-20 DIAGNOSIS — J449 Chronic obstructive pulmonary disease, unspecified: Secondary | ICD-10-CM

## 2015-08-20 MED ORDER — FLUTICASONE-SALMETEROL 250-50 MCG/DOSE IN AEPB: 1.0000 | INHALATION_SPRAY | Freq: Two times a day (BID) | RESPIRATORY_TRACT | Status: DC

## 2015-08-20 NOTE — Telephone Encounter (Signed)
done

## 2015-09-16 DIAGNOSIS — J449 Chronic obstructive pulmonary disease, unspecified: Secondary | ICD-10-CM | POA: Diagnosis not present

## 2015-09-17 DIAGNOSIS — J449 Chronic obstructive pulmonary disease, unspecified: Secondary | ICD-10-CM | POA: Diagnosis not present

## 2015-10-14 DIAGNOSIS — J449 Chronic obstructive pulmonary disease, unspecified: Secondary | ICD-10-CM | POA: Diagnosis not present

## 2015-10-15 DIAGNOSIS — J449 Chronic obstructive pulmonary disease, unspecified: Secondary | ICD-10-CM | POA: Diagnosis not present

## 2015-10-26 ENCOUNTER — Telehealth: Payer: Self-pay | Admitting: Family Medicine

## 2015-10-26 NOTE — Telephone Encounter (Signed)
Please review. Thanks!  

## 2015-10-26 NOTE — Telephone Encounter (Signed)
Pt's wife called saying [name] wants to order a supplement that he has seen on TV and wats to know if it will interfere with any of his other medications.(krillomega + Q10).   Please call her back [phone]  Thanks [name]

## 2015-10-26 NOTE — Telephone Encounter (Signed)
Advised patient's wife as below.  

## 2015-10-26 NOTE — Telephone Encounter (Signed)
None of the active ingredients listed are harmful. Would probably be safe to take. Might help his HDL cholesterol.

## 2015-11-14 DIAGNOSIS — J449 Chronic obstructive pulmonary disease, unspecified: Secondary | ICD-10-CM | POA: Diagnosis not present

## 2015-11-15 DIAGNOSIS — J449 Chronic obstructive pulmonary disease, unspecified: Secondary | ICD-10-CM | POA: Diagnosis not present

## 2015-12-09 ENCOUNTER — Encounter: Payer: Self-pay | Admitting: Physician Assistant

## 2015-12-09 ENCOUNTER — Ambulatory Visit (INDEPENDENT_AMBULATORY_CARE_PROVIDER_SITE_OTHER): Payer: Commercial Managed Care - HMO | Admitting: Physician Assistant

## 2015-12-09 VITALS — BP 100/60 | HR 90 | Temp 101.9°F | Resp 14 | Wt 191.6 lb

## 2015-12-09 DIAGNOSIS — J441 Chronic obstructive pulmonary disease with (acute) exacerbation: Secondary | ICD-10-CM

## 2015-12-09 DIAGNOSIS — R509 Fever, unspecified: Secondary | ICD-10-CM

## 2015-12-09 LAB — POCT INFLUENZA A/B
INFLUENZA A, POC: NEGATIVE
Influenza B, POC: NEGATIVE

## 2015-12-09 MED ORDER — AMOXICILLIN-POT CLAVULANATE 875-125 MG PO TABS: 1.0000 | ORAL_TABLET | Freq: Two times a day (BID) | ORAL | Status: DC

## 2015-12-09 NOTE — Patient Instructions (Signed)
Chronic Obstructive Pulmonary Disease Chronic obstructive pulmonary disease (COPD) is a common lung condition in which airflow from the lungs is limited. COPD is a general term that can be used to describe many different lung problems that limit airflow, including both chronic bronchitis and emphysema. If you have COPD, your lung function will probably never return to normal, but there are measures you can take to improve lung function and make yourself feel better. CAUSES   Smoking (common).  Exposure to secondhand smoke.  Genetic problems.  Chronic inflammatory lung diseases or recurrent infections. SYMPTOMS  Shortness of breath, especially with physical activity.  Deep, persistent (chronic) cough with a large amount of thick mucus.  Wheezing.  Rapid breaths (tachypnea).  Gray or bluish discoloration (cyanosis) of the skin, especially in your fingers, toes, or lips.  Fatigue.  Weight loss.  Frequent infections or episodes when breathing symptoms become much worse (exacerbations).  Chest tightness. DIAGNOSIS Your health care provider will take a medical history and perform a physical examination to diagnose COPD. Additional tests for COPD may include:  Lung (pulmonary) function tests.  Chest X-ray.  CT scan.  Blood tests. TREATMENT  Treatment for COPD may include:  Inhaler and nebulizer medicines. These help manage the symptoms of COPD and make your breathing more comfortable.  Supplemental oxygen. Supplemental oxygen is only helpful if you have a low oxygen level in your blood.  Exercise and physical activity. These are beneficial for nearly all people with COPD.  Lung surgery or transplant.  Nutrition therapy to gain weight, if you are underweight.  Pulmonary rehabilitation. This may involve working with a team of health care providers and specialists, such as respiratory, occupational, and physical therapists. HOME CARE INSTRUCTIONS  Take all medicines  (inhaled or pills) as directed by your health care provider.  Avoid over-the-counter medicines or cough syrups that dry up your airway (such as antihistamines) and slow down the elimination of secretions unless instructed otherwise by your health care provider.  If you are a smoker, the most important thing that you can do is stop smoking. Continuing to smoke will cause further lung damage and breathing trouble. Ask your health care provider for help with quitting smoking. He or she can direct you to community resources or hospitals that provide support.  Avoid exposure to irritants such as smoke, chemicals, and fumes that aggravate your breathing.  Use oxygen therapy and pulmonary rehabilitation if directed by your health care provider. If you require home oxygen therapy, ask your health care provider whether you should purchase a pulse oximeter to measure your oxygen level at home.  Avoid contact with individuals who have a contagious illness.  Avoid extreme temperature and humidity changes.  Eat healthy foods. Eating smaller, more frequent meals and resting before meals may help you maintain your strength.  Stay active, but balance activity with periods of rest. Exercise and physical activity will help you maintain your ability to do things you want to do.  Preventing infection and hospitalization is very important when you have COPD. Make sure to receive all the vaccines your health care provider recommends, especially the pneumococcal and influenza vaccines. Ask your health care provider whether you need a pneumonia vaccine.  Learn and use relaxation techniques to manage stress.  Learn and use controlled breathing techniques as directed by your health care provider. Controlled breathing techniques include:  Pursed lip breathing. Start by breathing in (inhaling) through your nose for 1 second. Then, purse your lips as if you were   going to whistle and breathe out (exhale) through the  pursed lips for 2 seconds.  Diaphragmatic breathing. Start by putting one hand on your abdomen just above your waist. Inhale slowly through your nose. The hand on your abdomen should move out. Then purse your lips and exhale slowly. You should be able to feel the hand on your abdomen moving in as you exhale.  Learn and use controlled coughing to clear mucus from your lungs. Controlled coughing is a series of short, progressive coughs. The steps of controlled coughing are: 1. Lean your head slightly forward. 2. Breathe in deeply using diaphragmatic breathing. 3. Try to hold your breath for 3 seconds. 4. Keep your mouth slightly open while coughing twice. 5. Spit any mucus out into a tissue. 6. Rest and repeat the steps once or twice as needed. SEEK MEDICAL CARE IF:  You are coughing up more mucus than usual.  There is a change in the color or thickness of your mucus.  Your breathing is more labored than usual.  Your breathing is faster than usual. SEEK IMMEDIATE MEDICAL CARE IF:  You have shortness of breath while you are resting.  You have shortness of breath that prevents you from:  Being able to talk.  Performing your usual physical activities.  You have chest pain lasting longer than 5 minutes.  Your skin color is more cyanotic than usual.  You measure low oxygen saturations for longer than 5 minutes with a pulse oximeter. MAKE SURE YOU:  Understand these instructions.  Will watch your condition.  Will get help right away if you are not doing well or get worse.   This information is not intended to replace advice given to you by your health care provider. Make sure you discuss any questions you have with your health care provider.   Document Released: 04/27/2005 Document Revised: 08/08/2014 Document Reviewed: 03/14/2013 Elsevier Interactive Patient Education 2016 Elsevier Inc.  

## 2015-12-09 NOTE — Progress Notes (Signed)
Patient: Bobby Peterson Male    DOB: 1941-05-26   75 y.o.   MRN: [id number] Visit Date: 12/09/2015  Today's Provider: Mar Daring, PA-C   Chief Complaint  Patient presents with  . URI   Subjective:    URI  This is a new problem. The current episode started yesterday. The problem has been gradually worsening. The maximum temperature recorded prior to his arrival was 101 - 101.9 F. The fever has been present for less than 1 day (yesterday and today). Associated symptoms include congestion, coughing, headaches, rhinorrhea, sneezing and a sore throat (yesterday). Pertinent negatives include no abdominal pain, chest pain, diarrhea, ear pain, joint swelling, nausea, neck pain, plugged ear sensation, sinus pain, swollen glands, vomiting or wheezing. He has tried antihistamine, sleep and acetaminophen (oxygen) for the symptoms. The treatment provided no relief.       Allergies  Allergen Reactions  . Fenofibrate     Other reaction(s): Headache  . Lisinopril Cough  . Niacin     Other reaction(s): Dizziness  . Phenergan [Promethazine Hcl]     NVD  . Simvastatin     Other reaction(s): Headache   Previous Medications   ASPIRIN 81 MG TABLET    Take 81 mg by mouth daily.   FLUTICASONE-SALMETEROL (ADVAIR DISKUS) 250-50 MCG/DOSE AEPB    Inhale 1 puff into the lungs 2 (two) times daily.   FUROSEMIDE (LASIX) 40 MG TABLET    Take 1 tablet (40 mg total) by mouth daily.   GABAPENTIN (NEURONTIN) 300 MG CAPSULE    Take 1 capsule (300 mg total) by mouth 2 (two) times daily.   LOSARTAN (COZAAR) 50 MG TABLET    TAKE 1 TABLET EVERY DAY   METOPROLOL SUCCINATE (TOPROL-XL) 50 MG 24 HR TABLET    Take 1 tablet (50 mg total) by mouth daily.   OMEPRAZOLE (PRILOSEC) 40 MG CAPSULE    Take 1 capsule (40 mg total) by mouth daily.   OXYGEN    Inhale into the lungs daily.   PRAVASTATIN (PRAVACHOL) 20 MG TABLET    Take 1 tablet (20 mg total) by mouth daily.   TAMSULOSIN (FLOMAX) 0.4 MG CAPS CAPSULE     Take 1 capsule (0.4 mg total) by mouth daily.   VITAMIN B-12 (CYANOCOBALAMIN) 1000 MCG TABLET    Take 1,000 mcg by mouth once a week.    Review of Systems  Constitutional: Positive for fever and fatigue. Negative for chills.  HENT: Positive for congestion, postnasal drip, rhinorrhea, sinus pressure, sneezing and sore throat (yesterday). Negative for ear pain.   Respiratory: Positive for cough and shortness of breath. Negative for chest tightness and wheezing.   Cardiovascular: Negative for chest pain, palpitations and leg swelling.  Gastrointestinal: Negative for nausea, vomiting, abdominal pain and diarrhea.  Musculoskeletal: Negative for neck pain and neck stiffness.  Neurological: Positive for headaches. Negative for dizziness.    Social History  Substance Use Topics  . Smoking status: Former Smoker -- 1.00 packs/day for 50 years    Types: Cigarettes    Quit date: 12/04/2003  . Smokeless tobacco: Current User    Types: Chew     Comment: Has been chewing tobacco since quitting smoking in 2005.  [name] Alcohol Use: No   Objective:   BP 100/60 mmHg  Pulse 90  Temp(Src) 101.9 F (38.8 C) (Oral)  Resp 14  Wt 191 lb 9.6 oz (86.909 kg)  SpO2 96%  Physical Exam  Constitutional: He appears  well-developed and well-nourished. He appears ill. No distress.  HENT:  Head: Normocephalic and atraumatic.  Right Ear: Hearing, tympanic membrane, external ear and ear canal normal. Tympanic membrane is not erythematous and not bulging. No middle ear effusion.  Left Ear: Hearing, tympanic membrane, external ear and ear canal normal. Tympanic membrane is not erythematous and not bulging.  No middle ear effusion.  Nose: Mucosal edema and rhinorrhea present. Right sinus exhibits no maxillary sinus tenderness and no frontal sinus tenderness. Left sinus exhibits no maxillary sinus tenderness and no frontal sinus tenderness.  Mouth/Throat: Uvula is midline, oropharynx is clear and moist and mucous  membranes are normal. No oropharyngeal exudate, posterior oropharyngeal edema or posterior oropharyngeal erythema.  Erythematous external canal; TM looked ok. Had orange discoloration on top of tongue-denies any cough suppressant, drink or food that may have discolored  Eyes: Conjunctivae and EOM are normal. Pupils are equal, round, and reactive to light. Right eye exhibits no discharge. Left eye exhibits no discharge.  Neck: Normal range of motion. Neck supple. No tracheal deviation present. No Brudzinski's sign and no Kernig's sign noted. No thyromegaly present.  Cardiovascular: Normal rate, regular rhythm and normal heart sounds.  Exam reveals no gallop and no friction rub.   No murmur heard. Pulmonary/Chest: Effort normal. No stridor. No respiratory distress. He has no decreased breath sounds. He has no wheezes. He has rhonchi (expiratory throughout). He has no rales.  Lymphadenopathy:    He has cervical adenopathy.  Skin: Skin is warm and dry. He is not diaphoretic.  Vitals reviewed.       Assessment & Plan:     1. Fever, unspecified fever cause Flu was negative. - POCT Influenza A/B  2. Chronic obstructive pulmonary disease with acute exacerbation (HCC) Worsening symptoms with high fever in patient with COPD on oxygen. O2 sat was stable at 96% on 2L oxygen which is normal for him. Will treat with augmentin as below. Advised to use tylenol for fever and body aches. He is to call if his O2 sats drop, he develops increased SOB or chest discomfort or if symptoms do not improve with antibiotic. If symptoms worsen will get CXR. - amoxicillin-clavulanate (AUGMENTIN) 875-125 MG tablet; Take 1 tablet by mouth 2 (two) times daily.  Dispense: 20 tablet; Refill: 0       Mar Daring, PA-C  [facility]

## 2015-12-14 DIAGNOSIS — J449 Chronic obstructive pulmonary disease, unspecified: Secondary | ICD-10-CM | POA: Diagnosis not present

## 2015-12-15 DIAGNOSIS — J449 Chronic obstructive pulmonary disease, unspecified: Secondary | ICD-10-CM | POA: Diagnosis not present

## 2015-12-31 DIAGNOSIS — J189 Pneumonia, unspecified organism: Secondary | ICD-10-CM | POA: Diagnosis not present

## 2015-12-31 DIAGNOSIS — R05 Cough: Secondary | ICD-10-CM | POA: Diagnosis not present

## 2016-01-08 ENCOUNTER — Encounter: Payer: Self-pay | Admitting: Family Medicine

## 2016-01-08 ENCOUNTER — Ambulatory Visit (INDEPENDENT_AMBULATORY_CARE_PROVIDER_SITE_OTHER): Payer: Commercial Managed Care - HMO | Admitting: Family Medicine

## 2016-01-08 VITALS — BP 90/62 | HR 88 | Temp 97.8°F | Resp 16 | Wt 191.0 lb

## 2016-01-08 DIAGNOSIS — J189 Pneumonia, unspecified organism: Secondary | ICD-10-CM

## 2016-01-08 DIAGNOSIS — L568 Other specified acute skin changes due to ultraviolet radiation: Secondary | ICD-10-CM | POA: Diagnosis not present

## 2016-01-08 DIAGNOSIS — T50905A Adverse effect of unspecified drugs, medicaments and biological substances, initial encounter: Principal | ICD-10-CM

## 2016-01-08 NOTE — Patient Instructions (Addendum)
Stop doxycycline. Use cool compresses for your rash and stay out of the sun. Add Robitussin or Mucinex to help with sputum.

## 2016-01-08 NOTE — Progress Notes (Signed)
Subjective:     Patient ID: Bobby Peterson, male   DOB: Dec 23, 1940, 75 y.o.   MRN: [id number]  HPI  Chief Complaint  Patient presents with  . Facial Swelling    Patient comes in office today accompanied by his wife with concerns of swelling to the face for the past 2 days. Patients wife reports that patient was diagnosed with Pneumonia on 12/31/15 and placed on antibiotics from [facility]. Wife reports that once patient started working doing yardwork in the sun she noticed that he bega to have facial swelling.   He has brought doxycycline (two days remaining) and cefpodoxime. He continues to have congested cough. Wife, [name], states since he has come out of the sun the redness has faded.   Review of Systems     Objective:   Physical Exam  Constitutional: He appears well-nourished. No distress (on oxygen today).  Pulmonary/Chest:  Decreased breath sounds throughout with L basilar coarse crackles.  Musculoskeletal: He exhibits no edema (of lower extremties).  Skin:  Mild facial swelling and redness of his face and dorsal hands       Assessment:    1. Drug-induced photosensitivity  2. Pneumonia due to organism     Plan:    Stop doxycycline but complete other antibiotic. Cool compresses for discomfort and stay out of the sun. Repeat chest x-ray in 2 weeks.

## 2016-01-14 DIAGNOSIS — J449 Chronic obstructive pulmonary disease, unspecified: Secondary | ICD-10-CM | POA: Diagnosis not present

## 2016-01-15 DIAGNOSIS — J449 Chronic obstructive pulmonary disease, unspecified: Secondary | ICD-10-CM | POA: Diagnosis not present

## 2016-01-25 ENCOUNTER — Other Ambulatory Visit: Payer: Self-pay | Admitting: Family Medicine

## 2016-01-25 ENCOUNTER — Telehealth: Payer: Self-pay | Admitting: Family Medicine

## 2016-01-25 DIAGNOSIS — J189 Pneumonia, unspecified organism: Secondary | ICD-10-CM

## 2016-01-25 NOTE — Telephone Encounter (Signed)
X-ray ordered.

## 2016-01-25 NOTE — Telephone Encounter (Signed)
Pt would like to have f/u chest x-ray from visit on 01/08/16.  Please contact the patient once order is in the system.

## 2016-01-25 NOTE — Telephone Encounter (Signed)
Left message of patient voice message machine that an order has been placed for his x ray.

## 2016-01-27 ENCOUNTER — Ambulatory Visit
Admission: RE | Admit: 2016-01-27 | Discharge: 2016-01-27 | Disposition: A | Discharge status: Home / Self Care | Payer: Commercial Managed Care - HMO | Source: Ambulatory Visit | Attending: Family Medicine | Admitting: Family Medicine

## 2016-01-27 DIAGNOSIS — I700 Atherosclerosis of aorta: Secondary | ICD-10-CM | POA: Diagnosis not present

## 2016-01-27 DIAGNOSIS — J189 Pneumonia, unspecified organism: Secondary | ICD-10-CM | POA: Insufficient documentation

## 2016-02-12 ENCOUNTER — Encounter: Payer: Self-pay | Admitting: Family Medicine

## 2016-02-12 ENCOUNTER — Ambulatory Visit (INDEPENDENT_AMBULATORY_CARE_PROVIDER_SITE_OTHER): Payer: Commercial Managed Care - HMO | Admitting: Family Medicine

## 2016-02-12 VITALS — BP 140/66 | HR 85 | Temp 97.9°F | Resp 16 | Wt 190.0 lb

## 2016-02-12 DIAGNOSIS — J449 Chronic obstructive pulmonary disease, unspecified: Secondary | ICD-10-CM

## 2016-02-12 DIAGNOSIS — J301 Allergic rhinitis due to pollen: Secondary | ICD-10-CM

## 2016-02-12 MED ORDER — AZITHROMYCIN 250 MG PO TABS: ORAL_TABLET | ORAL | Status: DC

## 2016-02-12 NOTE — Progress Notes (Signed)
Subjective:     Patient ID: Bobby Peterson, male   DOB: 03-Jan-1941, 75 y.o.   MRN: [id number]  HPI  Chief Complaint  Patient presents with  . Cough  States he has developed postnasal drainage/tickle with accompanying cough. States cough is occasionally productive of white sputum without increased shortness of breath. Reports compliance with Advair.States he does spend time outside and was mowing his yard last night.Reports he does not always need to use his oxygen. Accompanied by [name], his wife, today.   Review of Systems  Constitutional: Negative for fever and chills.       Objective:   Physical Exam  Constitutional: He appears well-developed and well-nourished. No distress (occasonal congested sounding cough).  Ears: T.M's intact without inflammation Throat: tonsils absent no posterior pharyngeal drainage noted. Neck: no cervical adenopathy Lungs: transient right basilar wheeze     Assessment:    1. Allergic rhinitis due to pollen  2. Chronic obstructive pulmonary disease, unspecified COPD type (HCC) - azithromycin (ZITHROMAX Z-PAK) 250 MG tablet; Take two the first day then one daily for 4 days.  Dispense: 6 each; Refill: 0    Plan:    Start with Claritin and Mucinex. If increased shortness of breath or change in sputum to start abx.

## 2016-02-12 NOTE — Patient Instructions (Signed)
Start Claritin and Mucinex. If increased shortness of breath or your sputum is turning colors start the antibiotic.

## 2016-02-13 DIAGNOSIS — J449 Chronic obstructive pulmonary disease, unspecified: Secondary | ICD-10-CM | POA: Diagnosis not present

## 2016-02-14 DIAGNOSIS — J449 Chronic obstructive pulmonary disease, unspecified: Secondary | ICD-10-CM | POA: Diagnosis not present

## 2016-03-15 DIAGNOSIS — J449 Chronic obstructive pulmonary disease, unspecified: Secondary | ICD-10-CM | POA: Diagnosis not present

## 2016-03-16 DIAGNOSIS — J449 Chronic obstructive pulmonary disease, unspecified: Secondary | ICD-10-CM | POA: Diagnosis not present

## 2016-03-25 ENCOUNTER — Other Ambulatory Visit: Payer: Self-pay | Admitting: Family Medicine

## 2016-03-28 ENCOUNTER — Telehealth: Payer: Self-pay | Admitting: Family Medicine

## 2016-03-28 NOTE — Telephone Encounter (Signed)
We do have samples in office? Okay to give patient? And if so how many samples to give? KW

## 2016-03-28 NOTE — Telephone Encounter (Signed)
A couple of samples.

## 2016-03-28 NOTE — Telephone Encounter (Signed)
Pt's wife [name] wanted to see if we have samples of Fluticasone-Salmeterol (ADVAIR DISKUS) 250-50 MCG/DOSE AEPB and if so, she would like to come pick some up. [name] stated that the medication cost 400$ out of pocket and they can't afford that. Please advise. Thanks TNP

## 2016-03-28 NOTE — Telephone Encounter (Signed)
Left message for wife letting her know that samples are at front desk. KW

## 2016-04-15 DIAGNOSIS — J449 Chronic obstructive pulmonary disease, unspecified: Secondary | ICD-10-CM | POA: Diagnosis not present

## 2016-04-16 DIAGNOSIS — J449 Chronic obstructive pulmonary disease, unspecified: Secondary | ICD-10-CM | POA: Diagnosis not present

## 2016-04-21 ENCOUNTER — Encounter: Payer: Self-pay | Admitting: Physician Assistant

## 2016-04-21 ENCOUNTER — Inpatient Hospital Stay (HOSPITAL_COMMUNITY)
Admit: 2016-04-21 | Discharge: 2016-04-21 | Disposition: A | Discharge status: Home / Self Care | Payer: Commercial Managed Care - HMO | Attending: Physician Assistant | Admitting: Physician Assistant

## 2016-04-21 ENCOUNTER — Inpatient Hospital Stay: Payer: Commercial Managed Care - HMO

## 2016-04-21 ENCOUNTER — Inpatient Hospital Stay
Admission: EM | Admit: 2016-04-21 | Discharge: 2016-04-27 | DRG: 871 | Disposition: A | Discharge status: Home / Self Care | Payer: Commercial Managed Care - HMO | Attending: Internal Medicine | Admitting: Internal Medicine

## 2016-04-21 ENCOUNTER — Emergency Department: Payer: Commercial Managed Care - HMO

## 2016-04-21 DIAGNOSIS — J9600 Acute respiratory failure, unspecified whether with hypoxia or hypercapnia: Secondary | ICD-10-CM

## 2016-04-21 DIAGNOSIS — I5032 Chronic diastolic (congestive) heart failure: Secondary | ICD-10-CM | POA: Diagnosis not present

## 2016-04-21 DIAGNOSIS — J440 Chronic obstructive pulmonary disease with acute lower respiratory infection: Secondary | ICD-10-CM | POA: Diagnosis present

## 2016-04-21 DIAGNOSIS — N183 Chronic kidney disease, stage 3 unspecified: Secondary | ICD-10-CM | POA: Diagnosis present

## 2016-04-21 DIAGNOSIS — I2510 Atherosclerotic heart disease of native coronary artery without angina pectoris: Secondary | ICD-10-CM | POA: Diagnosis present

## 2016-04-21 DIAGNOSIS — J9601 Acute respiratory failure with hypoxia: Secondary | ICD-10-CM | POA: Diagnosis present

## 2016-04-21 DIAGNOSIS — I471 Supraventricular tachycardia: Secondary | ICD-10-CM | POA: Diagnosis not present

## 2016-04-21 DIAGNOSIS — J150 Pneumonia due to Klebsiella pneumoniae: Secondary | ICD-10-CM | POA: Diagnosis not present

## 2016-04-21 DIAGNOSIS — J9621 Acute and chronic respiratory failure with hypoxia: Secondary | ICD-10-CM | POA: Diagnosis not present

## 2016-04-21 DIAGNOSIS — E7800 Pure hypercholesterolemia, unspecified: Secondary | ICD-10-CM | POA: Diagnosis present

## 2016-04-21 DIAGNOSIS — J9690 Respiratory failure, unspecified, unspecified whether with hypoxia or hypercapnia: Secondary | ICD-10-CM | POA: Diagnosis not present

## 2016-04-21 DIAGNOSIS — Z79899 Other long term (current) drug therapy: Secondary | ICD-10-CM

## 2016-04-21 DIAGNOSIS — J441 Chronic obstructive pulmonary disease with (acute) exacerbation: Secondary | ICD-10-CM | POA: Diagnosis present

## 2016-04-21 DIAGNOSIS — G9341 Metabolic encephalopathy: Secondary | ICD-10-CM | POA: Diagnosis present

## 2016-04-21 DIAGNOSIS — R410 Disorientation, unspecified: Secondary | ICD-10-CM | POA: Diagnosis not present

## 2016-04-21 DIAGNOSIS — Z452 Encounter for adjustment and management of vascular access device: Secondary | ICD-10-CM | POA: Diagnosis not present

## 2016-04-21 DIAGNOSIS — J690 Pneumonitis due to inhalation of food and vomit: Secondary | ICD-10-CM | POA: Diagnosis not present

## 2016-04-21 DIAGNOSIS — Z905 Acquired absence of kidney: Secondary | ICD-10-CM | POA: Diagnosis not present

## 2016-04-21 DIAGNOSIS — J181 Lobar pneumonia, unspecified organism: Secondary | ICD-10-CM

## 2016-04-21 DIAGNOSIS — J449 Chronic obstructive pulmonary disease, unspecified: Secondary | ICD-10-CM | POA: Diagnosis not present

## 2016-04-21 DIAGNOSIS — Z720 Tobacco use: Secondary | ICD-10-CM | POA: Diagnosis not present

## 2016-04-21 DIAGNOSIS — Z7982 Long term (current) use of aspirin: Secondary | ICD-10-CM | POA: Diagnosis not present

## 2016-04-21 DIAGNOSIS — R0600 Dyspnea, unspecified: Secondary | ICD-10-CM

## 2016-04-21 DIAGNOSIS — Z781 Physical restraint status: Secondary | ICD-10-CM | POA: Diagnosis not present

## 2016-04-21 DIAGNOSIS — E1122 Type 2 diabetes mellitus with diabetic chronic kidney disease: Secondary | ICD-10-CM | POA: Diagnosis not present

## 2016-04-21 DIAGNOSIS — Z4682 Encounter for fitting and adjustment of non-vascular catheter: Secondary | ICD-10-CM | POA: Diagnosis not present

## 2016-04-21 DIAGNOSIS — M6281 Muscle weakness (generalized): Secondary | ICD-10-CM

## 2016-04-21 DIAGNOSIS — I4719 Other supraventricular tachycardia: Secondary | ICD-10-CM | POA: Diagnosis not present

## 2016-04-21 DIAGNOSIS — A419 Sepsis, unspecified organism: Secondary | ICD-10-CM | POA: Diagnosis not present

## 2016-04-21 DIAGNOSIS — I5033 Acute on chronic diastolic (congestive) heart failure: Secondary | ICD-10-CM | POA: Diagnosis not present

## 2016-04-21 DIAGNOSIS — R6521 Severe sepsis with septic shock: Secondary | ICD-10-CM

## 2016-04-21 DIAGNOSIS — Z87891 Personal history of nicotine dependence: Secondary | ICD-10-CM | POA: Diagnosis not present

## 2016-04-21 DIAGNOSIS — J432 Centrilobular emphysema: Secondary | ICD-10-CM | POA: Diagnosis not present

## 2016-04-21 DIAGNOSIS — R451 Restlessness and agitation: Secondary | ICD-10-CM | POA: Diagnosis not present

## 2016-04-21 DIAGNOSIS — E876 Hypokalemia: Secondary | ICD-10-CM | POA: Diagnosis not present

## 2016-04-21 DIAGNOSIS — I4891 Unspecified atrial fibrillation: Secondary | ICD-10-CM | POA: Diagnosis present

## 2016-04-21 DIAGNOSIS — I130 Hypertensive heart and chronic kidney disease with heart failure and stage 1 through stage 4 chronic kidney disease, or unspecified chronic kidney disease: Secondary | ICD-10-CM | POA: Diagnosis present

## 2016-04-21 DIAGNOSIS — R0602 Shortness of breath: Secondary | ICD-10-CM | POA: Diagnosis present

## 2016-04-21 DIAGNOSIS — J189 Pneumonia, unspecified organism: Secondary | ICD-10-CM

## 2016-04-21 DIAGNOSIS — R2681 Unsteadiness on feet: Secondary | ICD-10-CM

## 2016-04-21 DIAGNOSIS — J90 Pleural effusion, not elsewhere classified: Secondary | ICD-10-CM | POA: Diagnosis not present

## 2016-04-21 HISTORY — DX: Malignant neoplasm of unspecified kidney, except renal pelvis: C64.9

## 2016-04-21 HISTORY — DX: Other supraventricular tachycardia: I47.19

## 2016-04-21 HISTORY — DX: Obesity, unspecified: E66.9

## 2016-04-21 HISTORY — DX: Supraventricular tachycardia: I47.1

## 2016-04-21 HISTORY — DX: Chronic respiratory failure, unspecified whether with hypoxia or hypercapnia: J96.10

## 2016-04-21 HISTORY — DX: Chronic diastolic (congestive) heart failure: I50.32

## 2016-04-21 LAB — BLOOD GAS, ARTERIAL
ACID-BASE DEFICIT: 5.4 mmol/L — AB (ref 0.0–2.0)
ALLENS TEST (PASS/FAIL): POSITIVE — AB
Acid-base deficit: 3.1 mmol/L — ABNORMAL HIGH (ref 0.0–2.0)
BICARBONATE: 24.4 mmol/L (ref 20.0–28.0)
Bicarbonate: 20.6 mmol/L (ref 20.0–28.0)
FIO2: 1
FIO2: 0.5
MECHVT: 500 mL
Mechanical Rate: 28
O2 Saturation: 99.8 %
O2 Saturation: 95.1 %
PATIENT TEMPERATURE: 37
PCO2 ART: 52 mmHg — AB (ref 32.0–48.0)
PEEP: 5 cmH2O
PEEP: 5 cmH2O
Patient temperature: 37
RATE: 28 resp/min
VT: 500 mL
pCO2 arterial: 41 mmHg (ref 32.0–48.0)
pH, Arterial: 7.28 — ABNORMAL LOW (ref 7.350–7.450)
pH, Arterial: 7.31 — ABNORMAL LOW (ref 7.350–7.450)
pO2, Arterial: 236 mmHg — ABNORMAL HIGH (ref 83.0–108.0)
pO2, Arterial: 83 mmHg (ref 83.0–108.0)

## 2016-04-21 LAB — CBC WITH DIFFERENTIAL/PLATELET
BASOS ABS: 0.1 10*3/uL (ref 0–0.1)
Basophils Relative: 0 %
EOS ABS: 0.1 10*3/uL (ref 0–0.7)
Eosinophils Relative: 0 %
HCT: 50 % (ref 40.0–52.0)
HEMOGLOBIN: 16.1 g/dL (ref 13.0–18.0)
LYMPHS ABS: 2.5 10*3/uL (ref 1.0–3.6)
LYMPHS PCT: 13 %
MCH: 27.7 pg (ref 26.0–34.0)
MCHC: 32.2 g/dL (ref 32.0–36.0)
MCV: 85.9 fL (ref 80.0–100.0)
Monocytes Absolute: 0.1 10*3/uL — ABNORMAL LOW (ref 0.2–1.0)
Monocytes Relative: 1 %
NEUTROS PCT: 86 %
Neutro Abs: 16 10*3/uL — ABNORMAL HIGH (ref 1.4–6.5)
Platelets: 324 10*3/uL (ref 150–440)
RBC: 5.82 MIL/uL (ref 4.40–5.90)
RDW: 15.9 % — ABNORMAL HIGH (ref 11.5–14.5)
WBC: 18.8 10*3/uL — AB (ref 3.8–10.6)

## 2016-04-21 LAB — URINALYSIS COMPLETE WITH MICROSCOPIC (ARMC ONLY)
BACTERIA UA: NONE SEEN
Bilirubin Urine: NEGATIVE
GLUCOSE, UA: NEGATIVE mg/dL
HGB URINE DIPSTICK: NEGATIVE
KETONES UR: NEGATIVE mg/dL
Leukocytes, UA: NEGATIVE
Nitrite: NEGATIVE
PROTEIN: NEGATIVE mg/dL
RBC / HPF: NONE SEEN RBC/hpf (ref 0–5)
SPECIFIC GRAVITY, URINE: 1.017 (ref 1.005–1.030)
pH: 5 (ref 5.0–8.0)

## 2016-04-21 LAB — TROPONIN I
TROPONIN I: 0.04 ng/mL — AB (ref <0.03)
Troponin I: 0.04 ng/mL (ref <0.03)
Troponin I: 0.03 ng/mL (ref <0.03)
Troponin I: 0.06 ng/mL (ref <0.03)

## 2016-04-21 LAB — COMPREHENSIVE METABOLIC PANEL
ALK PHOS: 102 U/L (ref 38–126)
ALT: 16 U/L — AB (ref 17–63)
AST: 23 U/L (ref 15–41)
Albumin: 3.9 g/dL (ref 3.5–5.0)
Anion gap: 9 (ref 5–15)
BILIRUBIN TOTAL: 0.2 mg/dL — AB (ref 0.3–1.2)
BUN: 15 mg/dL (ref 6–20)
CO2: 29 mmol/L (ref 22–32)
CREATININE: 1.87 mg/dL — AB (ref 0.61–1.24)
Calcium: 8.7 mg/dL — ABNORMAL LOW (ref 8.9–10.3)
Chloride: 104 mmol/L (ref 101–111)
GFR calc Af Amer: 38 mL/min — ABNORMAL LOW (ref >60)
GFR, EST NON AFRICAN AMERICAN: 33 mL/min — AB (ref >60)
Glucose, Bld: 178 mg/dL — ABNORMAL HIGH (ref 65–99)
Potassium: 4 mmol/L (ref 3.5–5.1)
Sodium: 142 mmol/L (ref 135–145)
TOTAL PROTEIN: 7.9 g/dL (ref 6.5–8.1)

## 2016-04-21 LAB — GLUCOSE, CAPILLARY
GLUCOSE-CAPILLARY: 222 mg/dL — AB (ref 65–99)
GLUCOSE-CAPILLARY: 306 mg/dL — AB (ref 65–99)
GLUCOSE-CAPILLARY: 340 mg/dL — AB (ref 65–99)
GLUCOSE-CAPILLARY: 354 mg/dL — AB (ref 65–99)
Glucose-Capillary: 242 mg/dL — ABNORMAL HIGH (ref 65–99)

## 2016-04-21 LAB — BRAIN NATRIURETIC PEPTIDE: B NATRIURETIC PEPTIDE 5: 41 pg/mL (ref 0.0–100.0)

## 2016-04-21 LAB — RAPID INFLUENZA A&B ANTIGENS
Influenza A (ARMC): NEGATIVE
Influenza B (ARMC): NEGATIVE

## 2016-04-21 LAB — LACTIC ACID, PLASMA
LACTIC ACID, VENOUS: 2.3 mmol/L — AB (ref 0.5–1.9)
Lactic Acid, Venous: 3.1 mmol/L (ref 0.5–1.9)

## 2016-04-21 LAB — APTT: aPTT: 25 seconds (ref 24–36)

## 2016-04-21 LAB — PROCALCITONIN: PROCALCITONIN: 0.18 ng/mL

## 2016-04-21 LAB — LIPASE, BLOOD: Lipase: 33 U/L (ref 11–51)

## 2016-04-21 LAB — MRSA PCR SCREENING: MRSA by PCR: POSITIVE — AB

## 2016-04-21 LAB — PROTIME-INR
INR: 1.02
Prothrombin Time: 13.4 seconds (ref 11.4–15.2)

## 2016-04-21 LAB — TSH: TSH: 1.556 u[IU]/mL (ref 0.350–4.500)

## 2016-04-21 MED ORDER — MIDAZOLAM HCL 5 MG/ML IJ SOLN: 0.0000 mg/h | INTRAMUSCULAR | Status: DC

## 2016-04-21 MED ORDER — HEPARIN SODIUM (PORCINE) 5000 UNIT/ML IJ SOLN
5000.0000 [IU] | Freq: Three times a day (TID) | INTRAMUSCULAR | Status: DC
  Administered 2016-04-21: 5000 [IU] via SUBCUTANEOUS
  Filled 2016-04-21: qty 1

## 2016-04-21 MED ORDER — PROPOFOL 1000 MG/100ML IV EMUL
5.0000 ug/kg/min | INTRAVENOUS | Status: DC
  Administered 2016-04-21: 20 ug/kg/min via INTRAVENOUS
  Administered 2016-04-21: 5 ug/kg/min via INTRAVENOUS

## 2016-04-21 MED ORDER — DEXTROSE 5 % IV SOLN
500.0000 mg | INTRAVENOUS | Status: DC
  Administered 2016-04-21 – 2016-04-23 (×3): 500 mg via INTRAVENOUS
  Filled 2016-04-21 (×3): qty 500

## 2016-04-21 MED ORDER — PIPERACILLIN-TAZOBACTAM 3.375 G IVPB: 3.3750 g | Freq: Four times a day (QID) | INTRAVENOUS | Status: DC

## 2016-04-21 MED ORDER — INSULIN ASPART 100 UNIT/ML ~~LOC~~ SOLN
0.0000 [IU] | SUBCUTANEOUS | Status: DC
  Administered 2016-04-21: 15 [IU] via SUBCUTANEOUS
  Administered 2016-04-21 – 2016-04-22 (×2): 11 [IU] via SUBCUTANEOUS
  Administered 2016-04-22: 2 [IU] via SUBCUTANEOUS
  Administered 2016-04-22: 5 [IU] via SUBCUTANEOUS
  Administered 2016-04-22: 3 [IU] via SUBCUTANEOUS
  Administered 2016-04-22 – 2016-04-23 (×2): 2 [IU] via SUBCUTANEOUS
  Administered 2016-04-23 (×3): 3 [IU] via SUBCUTANEOUS
  Administered 2016-04-23: 2 [IU] via SUBCUTANEOUS
  Administered 2016-04-23: 3 [IU] via SUBCUTANEOUS
  Administered 2016-04-23 – 2016-04-24 (×3): 2 [IU] via SUBCUTANEOUS
  Administered 2016-04-24: 3 [IU] via SUBCUTANEOUS
  Administered 2016-04-24 – 2016-04-25 (×5): 2 [IU] via SUBCUTANEOUS
  Filled 2016-04-21 (×4): qty 2
  Filled 2016-04-21: qty 1
  Filled 2016-04-21 (×5): qty 2
  Filled 2016-04-21: qty 3
  Filled 2016-04-21: qty 11
  Filled 2016-04-21: qty 15
  Filled 2016-04-21: qty 3
  Filled 2016-04-21: qty 2
  Filled 2016-04-21: qty 11
  Filled 2016-04-21: qty 3
  Filled 2016-04-21: qty 5
  Filled 2016-04-21 (×2): qty 2
  Filled 2016-04-21 (×3): qty 3

## 2016-04-21 MED ORDER — FENTANYL CITRATE (PF) 100 MCG/2ML IJ SOLN: 50.0000 ug | INTRAMUSCULAR | Status: DC | PRN

## 2016-04-21 MED ORDER — METHYLPREDNISOLONE SODIUM SUCC 125 MG IJ SOLR
60.0000 mg | Freq: Two times a day (BID) | INTRAMUSCULAR | Status: AC
  Administered 2016-04-21 – 2016-04-22 (×3): 60 mg via INTRAVENOUS
  Filled 2016-04-21 (×3): qty 2

## 2016-04-21 MED ORDER — MIDAZOLAM HCL 2 MG/2ML IJ SOLN
1.0000 mg | INTRAMUSCULAR | Status: DC | PRN
  Administered 2016-04-21 – 2016-04-23 (×4): 1 mg via INTRAVENOUS
  Filled 2016-04-21 (×4): qty 2

## 2016-04-21 MED ORDER — PROPOFOL 1000 MG/100ML IV EMUL
INTRAVENOUS | Status: AC
  Administered 2016-04-21: 5 ug/kg/min via INTRAVENOUS
  Filled 2016-04-21: qty 100

## 2016-04-21 MED ORDER — ADENOSINE 12 MG/4ML IV SOLN
INTRAVENOUS | Status: AC
  Administered 2016-04-21: 12 mg via INTRAVENOUS
  Filled 2016-04-21: qty 8

## 2016-04-21 MED ORDER — METHYLPREDNISOLONE SODIUM SUCC 125 MG IJ SOLR
INTRAMUSCULAR | Status: AC
  Filled 2016-04-21: qty 2

## 2016-04-21 MED ORDER — ONDANSETRON HCL 4 MG/2ML IJ SOLN
INTRAMUSCULAR | Status: AC | PRN
  Administered 2016-04-21: 4 mg via INTRAVENOUS

## 2016-04-21 MED ORDER — METHYLPREDNISOLONE SODIUM SUCC 125 MG IJ SOLR
125.0000 mg | Freq: Once | INTRAMUSCULAR | Status: AC
  Administered 2016-04-21: 125 mg via INTRAVENOUS

## 2016-04-21 MED ORDER — ADENOSINE 6 MG/2ML IV SOLN
6.0000 mg | Freq: Once | INTRAVENOUS | Status: AC
  Administered 2016-04-21: 6 mg via INTRAVENOUS

## 2016-04-21 MED ORDER — NOREPINEPHRINE 4 MG/250ML-% IV SOLN
INTRAVENOUS | Status: AC
  Administered 2016-04-21: 5 mg
  Filled 2016-04-21: qty 250

## 2016-04-21 MED ORDER — SODIUM CHLORIDE 0.9 % IV SOLN
INTRAVENOUS | Status: DC
  Administered 2016-04-21 – 2016-04-23 (×4): via INTRAVENOUS

## 2016-04-21 MED ORDER — IPRATROPIUM-ALBUTEROL 0.5-2.5 (3) MG/3ML IN SOLN: 3.0000 mL | RESPIRATORY_TRACT | Status: DC | PRN

## 2016-04-21 MED ORDER — ENOXAPARIN SODIUM 40 MG/0.4ML ~~LOC~~ SOLN
40.0000 mg | SUBCUTANEOUS | Status: DC
  Administered 2016-04-21: 40 mg via SUBCUTANEOUS
  Filled 2016-04-21: qty 0.4

## 2016-04-21 MED ORDER — NOREPINEPHRINE BITARTRATE 1 MG/ML IV SOLN: 0.0000 ug/min | INTRAVENOUS | Status: DC

## 2016-04-21 MED ORDER — ASPIRIN 300 MG RE SUPP
300.0000 mg | RECTAL | Status: AC
  Administered 2016-04-21: 300 mg via RECTAL
  Filled 2016-04-21: qty 1

## 2016-04-21 MED ORDER — SODIUM CHLORIDE 0.9 % IV BOLUS (SEPSIS)
1000.0000 mL | Freq: Once | INTRAVENOUS | Status: AC
  Administered 2016-04-21: 1000 mL via INTRAVENOUS

## 2016-04-21 MED ORDER — SODIUM CHLORIDE 0.9 % IV SOLN
1.0000 mg/h | INTRAVENOUS | Status: DC
  Administered 2016-04-21: 2 mg/h via INTRAVENOUS
  Filled 2016-04-21: qty 10

## 2016-04-21 MED ORDER — IPRATROPIUM-ALBUTEROL 0.5-2.5 (3) MG/3ML IN SOLN
3.0000 mL | Freq: Four times a day (QID) | RESPIRATORY_TRACT | Status: DC
  Administered 2016-04-21 – 2016-04-27 (×23): 3 mL via RESPIRATORY_TRACT
  Filled 2016-04-21 (×23): qty 3

## 2016-04-21 MED ORDER — IPRATROPIUM-ALBUTEROL 0.5-2.5 (3) MG/3ML IN SOLN
RESPIRATORY_TRACT | Status: AC
  Administered 2016-04-21: 06:00:00
  Filled 2016-04-21: qty 6

## 2016-04-21 MED ORDER — FENTANYL BOLUS VIA INFUSION
50.0000 ug | INTRAVENOUS | Status: DC | PRN
Start: 2016-04-21 — End: 2016-04-25
  Administered 2016-04-22: 50 ug via INTRAVENOUS
  Filled 2016-04-21: qty 50

## 2016-04-21 MED ORDER — FAMOTIDINE IN NACL 20-0.9 MG/50ML-% IV SOLN
20.0000 mg | INTRAVENOUS | Status: DC
  Administered 2016-04-22 – 2016-04-25 (×4): 20 mg via INTRAVENOUS
  Filled 2016-04-21 (×5): qty 50

## 2016-04-21 MED ORDER — FENTANYL 2500MCG IN NS 250ML (10MCG/ML) PREMIX INFUSION
INTRAVENOUS | Status: AC
  Administered 2016-04-21: 75 ug/h via INTRAVENOUS
  Filled 2016-04-21: qty 250

## 2016-04-21 MED ORDER — FENTANYL 2500MCG IN NS 250ML (10MCG/ML) PREMIX INFUSION
20.0000 ug/h | INTRAVENOUS | Status: DC
  Administered 2016-04-21: 75 ug/h via INTRAVENOUS

## 2016-04-21 MED ORDER — SODIUM CHLORIDE 0.9 % IV SOLN: 250.0000 mL | INTRAVENOUS | Status: DC | PRN

## 2016-04-21 MED ORDER — ONDANSETRON HCL 4 MG/2ML IJ SOLN
INTRAMUSCULAR | Status: AC
  Filled 2016-04-21: qty 2

## 2016-04-21 MED ORDER — ADENOSINE 12 MG/4ML IV SOLN
12.0000 mg | Freq: Once | INTRAVENOUS | Status: AC
  Administered 2016-04-21: 12 mg via INTRAVENOUS

## 2016-04-21 MED ORDER — FENTANYL 2500MCG IN NS 250ML (10MCG/ML) PREMIX INFUSION
0.0000 ug/h | INTRAVENOUS | Status: DC
  Administered 2016-04-21 – 2016-04-22 (×2): 100 ug/h via INTRAVENOUS
  Filled 2016-04-21: qty 250

## 2016-04-21 MED ORDER — ASPIRIN 81 MG PO CHEW: 324.0000 mg | CHEWABLE_TABLET | ORAL | Status: AC

## 2016-04-21 MED ORDER — NOREPINEPHRINE BITARTRATE 1 MG/ML IV SOLN
0.0000 ug/min | INTRAVENOUS | Status: DC
  Administered 2016-04-21: 15 ug/min via INTRAVENOUS
  Administered 2016-04-21: 5 ug/min via INTRAVENOUS
  Administered 2016-04-22 (×2): 10 ug/min via INTRAVENOUS
  Filled 2016-04-21 (×5): qty 4

## 2016-04-21 MED ORDER — SODIUM CHLORIDE 0.9% FLUSH: 10.0000 mL | INTRAVENOUS | Status: DC | PRN

## 2016-04-21 MED ORDER — VANCOMYCIN HCL IN DEXTROSE 1-5 GM/200ML-% IV SOLN
1000.0000 mg | INTRAVENOUS | Status: DC
  Administered 2016-04-21 – 2016-04-22 (×2): 1000 mg via INTRAVENOUS
  Filled 2016-04-21 (×4): qty 200

## 2016-04-21 MED ORDER — VANCOMYCIN HCL IN DEXTROSE 1-5 GM/200ML-% IV SOLN
1000.0000 mg | Freq: Once | INTRAVENOUS | Status: AC
  Administered 2016-04-21: 1000 mg via INTRAVENOUS
  Filled 2016-04-21: qty 200

## 2016-04-21 MED ORDER — BUDESONIDE 0.5 MG/2ML IN SUSP
0.5000 mg | Freq: Two times a day (BID) | RESPIRATORY_TRACT | Status: DC
  Administered 2016-04-21 – 2016-04-27 (×12): 0.5 mg via RESPIRATORY_TRACT
  Filled 2016-04-21 (×12): qty 2

## 2016-04-21 MED ORDER — MIDAZOLAM HCL 2 MG/2ML IJ SOLN
1.0000 mg | INTRAMUSCULAR | Status: AC | PRN
  Administered 2016-04-21 (×3): 1 mg via INTRAVENOUS

## 2016-04-21 MED ORDER — DILTIAZEM HCL 100 MG IV SOLR
5.0000 mg/h | INTRAVENOUS | Status: DC
  Administered 2016-04-21 – 2016-04-22 (×2): 5 mg/h via INTRAVENOUS
  Administered 2016-04-22 – 2016-04-23 (×3): 15 mg/h via INTRAVENOUS
  Filled 2016-04-21 (×9): qty 100

## 2016-04-21 MED ORDER — PIPERACILLIN-TAZOBACTAM 3.375 G IVPB
3.3750 g | Freq: Three times a day (TID) | INTRAVENOUS | Status: DC
  Administered 2016-04-21 – 2016-04-23 (×6): 3.375 g via INTRAVENOUS
  Filled 2016-04-21 (×6): qty 50

## 2016-04-21 MED ORDER — FENTANYL CITRATE (PF) 100 MCG/2ML IJ SOLN
50.0000 ug | Freq: Once | INTRAMUSCULAR | Status: AC
  Administered 2016-04-21: 50 ug via INTRAVENOUS

## 2016-04-21 MED ORDER — FENTANYL CITRATE (PF) 100 MCG/2ML IJ SOLN
INTRAMUSCULAR | Status: AC | PRN
  Administered 2016-04-21: 100 ug via INTRAVENOUS

## 2016-04-21 MED ORDER — INSULIN ASPART 100 UNIT/ML ~~LOC~~ SOLN
1.0000 [IU] | SUBCUTANEOUS | Status: DC
  Administered 2016-04-21: 3 [IU] via SUBCUTANEOUS
  Filled 2016-04-21: qty 3

## 2016-04-21 MED ORDER — ADENOSINE 6 MG/2ML IV SOLN
INTRAVENOUS | Status: AC
  Administered 2016-04-21: 6 mg via INTRAVENOUS
  Filled 2016-04-21: qty 2

## 2016-04-21 MED ORDER — PIPERACILLIN-TAZOBACTAM 3.375 G IVPB 30 MIN
3.3750 g | Freq: Once | INTRAVENOUS | Status: AC
  Administered 2016-04-21: 3.375 g via INTRAVENOUS

## 2016-04-21 MED ORDER — NOREPINEPHRINE BITARTRATE 1 MG/ML IV SOLN
0.0000 ug/min | Freq: Once | INTRAVENOUS | Status: AC
  Administered 2016-04-21: 5 ug/min via INTRAVENOUS

## 2016-04-21 MED ORDER — ENOXAPARIN SODIUM 40 MG/0.4ML ~~LOC~~ SOLN: 30.0000 mg | SUBCUTANEOUS | Status: DC

## 2016-04-21 MED ORDER — SODIUM CHLORIDE 0.9 % IV BOLUS (SEPSIS)
721.0000 mL | Freq: Once | INTRAVENOUS | Status: AC
  Administered 2016-04-21: 721 mL via INTRAVENOUS

## 2016-04-21 MED ORDER — FAMOTIDINE IN NACL 20-0.9 MG/50ML-% IV SOLN
20.0000 mg | Freq: Two times a day (BID) | INTRAVENOUS | Status: DC
  Administered 2016-04-21: 20 mg via INTRAVENOUS
  Filled 2016-04-21: qty 50

## 2016-04-21 MED ORDER — FENTANYL CITRATE (PF) 100 MCG/2ML IJ SOLN
50.0000 ug | INTRAMUSCULAR | Status: DC | PRN
  Administered 2016-04-21: 50 ug via INTRAVENOUS

## 2016-04-21 MED ORDER — ETOMIDATE 2 MG/ML IV SOLN
INTRAVENOUS | Status: AC | PRN
  Administered 2016-04-21: 30 mg via INTRAVENOUS

## 2016-04-21 MED ORDER — SUCCINYLCHOLINE CHLORIDE 20 MG/ML IJ SOLN
INTRAMUSCULAR | Status: AC | PRN
  Administered 2016-04-21: 120 mg via INTRAVENOUS

## 2016-04-21 MED ORDER — SODIUM CHLORIDE 0.9% FLUSH
10.0000 mL | Freq: Two times a day (BID) | INTRAVENOUS | Status: DC
  Administered 2016-04-21: 30 mL
  Administered 2016-04-22 – 2016-04-27 (×10): 10 mL

## 2016-04-21 NOTE — ED Notes (Signed)
Informed RN bed ready 

## 2016-04-21 NOTE — H&P (Signed)
PULMONARY / CRITICAL CARE MEDICINE   Name: Bobby Peterson MRN: [id number] DOB: 1941-06-25    ADMISSION DATE:  04/21/2016 CONSULTATION DATE:  04/21/16  REFERRING MD: Dr. Edd Fabian  CHIEF COMPLAINT:  Acute Respiratory failure  HISTORY OF PRESENT ILLNESS:    Bobby Peterson is a 75 yo male with h/o COPD, CHF, CA and ,Hypertension.  Patient presents to the ED for evaluation of vomiting and shortness of breath noted by his wife earlier this morning.  Wife reports that he woke up vomiting and was short of breath.  Patient arrived in severe respiratory distress to [facility] and was pulled from the vehicle by staff.  As per the Dr, Edd Fabian, patient was tried on BiPAP and he started vomiting therefore patient was intubated for airway protection.  PCCM team was called to admit the patient.  PAST MEDICAL HISTORY :  He  has a past medical history of Cancer [facility]); CHF (congestive heart failure) ([place]); COPD (chronic obstructive pulmonary disease) ([place]); Coronary artery disease; Diabetes ([place]); Heart failure ([place]); Hernia, umbilical; Hypercholesteremia; Hypertension; Kidney disease; and Oxygen decrease.  PAST SURGICAL HISTORY: He  has a past surgical history that includes Nephrectomy (1990); Colonoscopy; Cholecystectomy (N/A, 12/22/2014); Umbilical hernia repair (N/A, 12/22/2014); Omentectomy (12/22/2014); ERCP (N/A, 01/05/2015); ERCP (N/A, 02/19/2015); Stent removal; and Gallbladder surgery (11/2014).  Allergies  Allergen Reactions  . Fenofibrate     Other reaction(s): Headache  . Lisinopril Cough  . Niacin     Other reaction(s): Dizziness  . Phenergan [Promethazine Hcl]     NVD  . Simvastatin     Other reaction(s): Headache    No current facility-administered medications on file prior to encounter.    Current Outpatient Prescriptions on File Prior to Encounter  Medication Sig  . aspirin 81 MG tablet Take 81 mg by mouth daily.  [name] azithromycin (ZITHROMAX Z-PAK) 250 MG tablet Take two the first day then one  daily for 4 days.  [name] doxycycline (VIBRAMYCIN) 100 MG capsule   . Fluticasone-Salmeterol (ADVAIR DISKUS) 250-50 MCG/DOSE AEPB Inhale 1 puff into the lungs 2 (two) times daily.  . furosemide (LASIX) 40 MG tablet Take 1 tablet (40 mg total) by mouth daily.  [name] gabapentin (NEURONTIN) 300 MG capsule Take 1 capsule (300 mg total) by mouth 2 (two) times daily.  [name] losartan (COZAAR) 50 MG tablet TAKE 1 TABLET EVERY DAY  . metoprolol succinate (TOPROL-XL) 50 MG 24 hr tablet Take 1 tablet (50 mg total) by mouth daily.  [name] omeprazole (PRILOSEC) 40 MG capsule Take 1 capsule (40 mg total) by mouth daily.  . OXYGEN Inhale into the lungs daily.  . pravastatin (PRAVACHOL) 20 MG tablet Take 1 tablet (20 mg total) by mouth daily.  . tamsulosin (FLOMAX) 0.4 MG CAPS capsule Take 1 capsule (0.4 mg total) by mouth daily.  . vitamin B-12 (CYANOCOBALAMIN) 1000 MCG tablet Take 1,000 mcg by mouth once a week.    FAMILY HISTORY:  His indicated that his mother is deceased. He indicated that his father is deceased.    SOCIAL HISTORY: He  reports that he quit smoking about 12 years ago. His smoking use included Cigarettes. He has a 50.00 pack-year smoking history. His smokeless tobacco use includes Chew. He reports that he does not drink alcohol or use drugs.  REVIEW OF SYSTEMS:   Unable to obtain  SUBJECTIVE:  Unable to obtain as the patient is intubated and mechanically ventilated  VITAL SIGNS: BP (!) 99/59   Pulse 60   Resp (!) 28  Wt 90.7 kg (200 lb)   SpO2 100%   BMI 30.41 kg/m   HEMODYNAMICS:    VENTILATOR SETTINGS: Vent Mode: AC FiO2 (%):  [70 %-100 %] 70 % Set Rate:  [28 bmp] 28 bmp Vt Set:  [500 mL] 500 mL PEEP:  [5 cmH20] 5 cmH20  INTAKE / OUTPUT: No intake/output data recorded.  PHYSICAL EXAMINATION: General:  Very sickly appearing, elderly  White male, intubated and on mechanical ventilation Neuro:  obtunded HEENT:  Atraumatic, normocephalic, no discharge, no JVD  appreciated Cardiovascular: S1S2, RRR, no MRG noted Lungs: very diminished on left side, no crackles, rhonchi noted Abdomen: soft, nontender, active bowel sounds Musculoskeletal:  No inflammation/ deformity noted Skin: grossly intact.  LABS:  BMET  Recent Labs Lab 04/21/16 0525  NA 142  K 4.0  CL 104  CO2 29  BUN 15  CREATININE 1.87*  GLUCOSE 178*    Electrolytes  Recent Labs Lab 04/21/16 0525  CALCIUM 8.7*    CBC  Recent Labs Lab 04/21/16 0525  WBC 18.8*  HGB 16.1  HCT 50.0  PLT 324    Coag's  Recent Labs Lab 04/21/16 0525  APTT 25  INR 1.02    Sepsis Markers  Recent Labs Lab 04/21/16 0525  LATICACIDVEN 3.1*    ABG  Recent Labs Lab 04/21/16 0600  PHART 7.28*  PCO2ART 52*  PO2ART 236*    Liver Enzymes  Recent Labs Lab 04/21/16 0525  AST 23  ALT 16*  ALKPHOS 102  BILITOT 0.2*  ALBUMIN 3.9    Cardiac Enzymes  Recent Labs Lab 04/21/16 0525  TROPONINI 0.04*    Glucose No results for input(s): GLUCAP in the last 168 hours.  Imaging Dg Abdomen 1 View  Result Date: 04/21/2016 CLINICAL DATA:  Enteric tube placement. EXAM: ABDOMEN - 1 VIEW COMPARISON:  CT abdomen and pelvis 01/12/2015 FINDINGS: An enteric tube has been placed with tip in the left upper quadrant consistent with location in the upper stomach. Multiple superimposed lines and wires. These are likely extrinsic. Surgical clips in the right upper quadrant. Visualized bowel gas pattern is unremarkable. Left pleural effusion with left pulmonary infiltrates. IMPRESSION: Enteric tube tip localizes to the left upper quadrant consistent with location in the upper stomach. Electronically Signed   By: Lucienne Capers M.D.   On: 04/21/2016 06:41   Dg Chest Portable 1 View  Result Date: 04/21/2016 CLINICAL DATA:  Endotracheal tube placement. Patient's iodine ache with gasping respirations. EXAM: PORTABLE CHEST 1 VIEW COMPARISON:  01/27/2016 FINDINGS: Endotracheal tube placed  with tip measuring 3.4 cm above the carina. Enteric tube tip is off the field of view but below the left hemidiaphragm. There is interval development since the previous study of diffuse patchy infiltration throughout the left lung most likely represent pneumonia. Small left pleural effusion. Right lung appears clear. IMPRESSION: Appliances appear in satisfactory position. Diffuse infiltration throughout the left lung with small effusion likely representing pneumonia. Electronically Signed   By: Lucienne Capers M.D.   On: 04/21/2016 06:39     STUDIES:  none  CULTURES: 04/21/16 Blood cultures>> 9//21/17 sputum cultures>>  ANTIBIOTICS: 9/21 vancomycin>> 9/21 Zosyn>>  SIGNIFICANT EVENTS: 9/21 /17 Patient admitted to [facility] ICU sepsis related to PNA. On pressors and mechanical ventilation.  LINES/TUBES: 9/21 right IJ TLC>>  DISCUSSION: Patient admitted  With copd and left sided PNA.  In ARF, mechanically ventilated  ASSESSMENT / PLAN:  PULMONARY A: Acute on chronic respiratory failure COPD exacerbation Left sided PNA  P:  Full vent support, wean as tolerated Fentanyl, versed Routine ABG CXR in am Vanc/ Zosyn  CARDIOVASCULAR A:  Septic Shock Hx of Hypertension Hx of CHF P:  Continuous telemetry Levo gtt Keep MAP goals >65  RENAL A:   Chronic kidney disease stage- 3 P:   Strict I/o  BMET in am Replace electrolytes per ICU protocol  GASTROINTESTINAL A:   No active issues P:   Npo Famotidine for GIP  HEMATOLOGIC A:   No active issues P:  lovenox for DVT prophylaxis Transfuse if Hgb<7 INFECTIOUS A:   Septic shock related to PNA Left side PNA  P:   Monitor fever curve Follow cultures Vanc/ zosyn  ENDOCRINE A:   Diabetese melitus P:   Blood sugar cecks SSI  NEUROLOGIC A:   No active issues P:   RASS goal: 0 Minimize sedation    Bincy Varughese,AG-ACNP Pulmonary and [place]    04/21/2016, 7:19  AM  STAFF NOTE: I, Dr. Vilinda Boehringer have personally reviewed patient's available data, including medical history, events of note, physical examination and test results as part of my evaluation. I have discussed with NP  Varughese  and other care providers such as pharmacist, RN and RRT.    75 yo male with h/o COPD, CHF, CA and ,Hypertension.  Patient presents to the ED for evaluation of vomiting and shortness of breath noted by his wife earlier this morning.  Wife reports that he woke up vomiting and was short of breath.  Noted to be in severe respiratory distress upon arrival to the ER by family members. ER staff status the patient on BiPAP, but he can tear to be respiratory distress and started vomiting. Given his declining clinical status and inability to protect his airway, he was intubated.  A:75 yo male with history of COPD, diastolic CHF, hypertension, now with respiratory failure secondary to left-sided pneumonia.  Acute hypoxic respiratory failure Left-sided pneumonia Diastolic heart failure Tachyarrhythmia Hypertension    P:  ABG reviewed, no settings needed to be changed to mechanical ventilation Continue with mechanical ventilation and wean as tolerated Keep O2 saturations greater than 88% Hypoglycemia protocol Duo nebs every 6, when necessary every 2 Pulmicort twice a day Methylprednisolone 60 mg twice a day Tachyarrhythmia, started on diltiazem drip, obtain cardiology consult (differential includes SVT versus atrial fibrillation)  .  Rest per NP/medical resident whose note is outlined above and that I agree with  The patient is critically ill with multiple organ systems failure and requires high complexity decision making for assessment and support, frequent evaluation and titration of therapies, application of advanced monitoring technologies and extensive interpretation of multiple databases.   Critical Care Time devoted to patient care services described in this note  is  88 Minutes.   This time reflects time of care of this signee Dr Vilinda Boehringer.  This critical care time does not reflect procedure time, or teaching time or supervisory time of PA/NP/Med-student/Med Resident etc but could involve care discussion time.  Vilinda Boehringer, MD [place] [facility] Pager [phone] (please enter 7-digits) On Call Pager [phone] (please enter 7-digits)  Note: This note was prepared with Dragon dictation along with smaller phrase technology. Any transcriptional errors that result from this process are unintentional.

## 2016-04-21 NOTE — ED Notes (Addendum)
Pt assisted out of vehicle, cyanotic, home O2 in place, gasping respirations; taken immed to room 15 via w/c; Dr Edd Fabian, RT, care nurse, charge nurse called to room

## 2016-04-21 NOTE — Procedures (Signed)
Central Venous Catheter Insertion Procedure Note -Right Internal Jugular Bobby Peterson [id number] 06/13/1941  Procedure: Insertion of Central Venous Catheter Indications: Assessment of intravascular volume, Drug and/or fluid administration and Frequent blood sampling  Procedure Details Consent: Risks of procedure as well as the alternatives and risks of each were explained to the (patient/caregiver).  Consent for procedure obtained. Time Out: Verified patient identification, verified procedure, site/side was marked, verified correct patient position, special equipment/implants available, medications/allergies/relevent history reviewed, required imaging and test results available.  Performed  Maximum sterile technique was used including antiseptics, cap, gloves, gown, hand hygiene, mask and sheet. Skin prep: Chlorhexidine; local anesthetic administered A antimicrobial bonded/coated triple lumen catheter was placed in the right internal jugular vein using the Seldinger technique.  Evaluation Blood flow good Complications: No apparent complications Patient did tolerate procedure well. Chest X-ray ordered to verify placement.  CXR: pending.  Procedure performed under direct ultrasound guidance for real time vessel cannulation.      [facility]  Site examined, present for procedure. Good blood return.   Vilinda Boehringer, MD [place] [facility] Pager [phone] (please enter 7-digits) On Call Pager - [phone] (please enter 7-digits)

## 2016-04-21 NOTE — Consult Note (Signed)
Cardiology Consultation Note  Patient ID: Bobby Peterson, MRN: [id number], DOB/AGE: 1941/07/18 75 y.o. Admit date: 04/21/2016   Date of Consult: 04/21/2016 Primary Physician: Lelon Huh, MD Primary Cardiologist: New to [facility] Requesting Physician: Dr. Stevenson Clinch, MD  Chief Complaint: SOB Reason for Consult: Tachycardia   HPI: 75 y.o. male with h/o CAD reportedly medically managed by remote cardiac cath through [facility] years ago, chronic diastolic CHF, chronic respiratory failure on home O2 secondary to COPD 2/2 tobacco abuse, renal cancer s/p nephrectomy, HTN, DM, and obesity who presented to [facility] on 9/21 with increased SOB and cough found to have septic shock and acute on chronic respiratory failure requiring intubation in the setting of extensive left upper lobe PNA.   History is obtained from the daughter who is in the room as well as prior notes. He was apparently seen by [facility] years ago per his daughter and underwent cardiac cath that showed multi-vessel CAD with collaterals. No PCI was done at that time. He never followed up with cardiology. He presented to the [facility] ED on 9/21 with increased SOB early this morning. Upon his arrival to the ED he required assistance getting out of his car 2/2 weakness. He was initially placed on BiPAP 2/2 hypoxia, though had an episode of emesis and required intubation to protect his airway. He was found to have acute on chronic respiratory failure requiring intubation with septic shock in the setting of extensive left upper lobe PNA. He remains intubated at this time with hypotension with SBP in the 80's on Levophed. He was started on Cardizem gtt this morning in the ICU 2/2 runs of what appear to be atrial tachycardia and frequent PACs on telemetry. Troponin at time of cardiology consult 0.04. BNP 41.0.  Lactic acid 3.1. Procalcitonin 0.18. BG with pH of 7.28, pO2 of 236, pCO2 of 52. SCr 1.87. K+ 4.0. Influenza A and B negative. Magnesium pending.    Past Medical History:  Diagnosis Date  . Chronic diastolic CHF (congestive heart failure) ([place])   . Chronic respiratory failure (HCC)    a. on home O2  . COPD (chronic obstructive pulmonary disease) ([place])   . Coronary artery disease   . Diabetes ([place])   . Hernia, umbilical   . Hypercholesteremia   . Hypertension   . Obesity   . Renal cancer [facility])    a. s/p nephrectomy in 1980's      Most Recent Cardiac Studies: None on file   Surgical History:  Past Surgical History:  Procedure Laterality Date  . CHOLECYSTECTOMY N/A 12/22/2014   Procedure: LAPAROSCOPIC CHOLECYSTECTOMY WITH INTRAOPERATIVE CHOLANGIOGRAM;  Surgeon: Christene Lye, MD;  Location: [facility] ORS;  Service: General;  Laterality: N/A;  . COLONOSCOPY    . ERCP N/A 01/05/2015   Procedure: ENDOSCOPIC RETROGRADE CHOLANGIOPANCREATOGRAPHY (ERCP);  Surgeon: Hulen Luster, MD;  Location: [facility] ENDOSCOPY;  Service: Endoscopy;  Laterality: N/A;  . ERCP N/A 02/19/2015   Procedure: ENDOSCOPIC RETROGRADE CHOLANGIOPANCREATOGRAPHY (ERCP);  Surgeon: Hulen Luster, MD;  Location: [facility] ENDOSCOPY;  Service: Gastroenterology;  Laterality: N/A;  . GALLBLADDER SURGERY  11/2014  . NEPHRECTOMY  1990  . OMENTECTOMY  12/22/2014   Procedure: OMENTECTOMY;  Surgeon: Christene Lye, MD;  Location: [facility] ORS;  Service: General;;  Partial omentectomy  . STENT REMOVAL    . UMBILICAL HERNIA REPAIR N/A 12/22/2014   Procedure: HERNIA REPAIR UMBILICAL ADULT;  Surgeon: Christene Lye, MD;  Location: [facility] ORS;  Service: General;  Laterality: N/A;  Home Meds: Prior to Admission medications   Medication Sig Start Date End Date Taking? Authorizing Provider  aspirin 81 MG tablet Take 81 mg by mouth daily.   Yes Historical Provider, MD  esomeprazole (NEXIUM) 40 MG capsule Take 40 mg by mouth daily at 12 noon.   Yes Historical Provider, MD  losartan (COZAAR) 50 MG tablet TAKE 1 TABLET EVERY DAY 08/06/15  Yes Carmon Ginsberg, PA  metoprolol succinate  (TOPROL-XL) 50 MG 24 hr tablet Take 1 tablet (50 mg total) by mouth daily. 08/04/15  Yes Carmon Ginsberg, PA  tamsulosin (FLOMAX) 0.4 MG CAPS capsule Take 1 capsule (0.4 mg total) by mouth daily. 08/04/15  Yes Carmon Ginsberg, PA  azithromycin (ZITHROMAX Z-PAK) 250 MG tablet Take two the first day then one daily for 4 days. 02/12/16   Carmon Ginsberg, PA  doxycycline (VIBRAMYCIN) 100 MG capsule  12/31/15   Historical Provider, MD  Fluticasone-Salmeterol (ADVAIR DISKUS) 250-50 MCG/DOSE AEPB Inhale 1 puff into the lungs 2 (two) times daily. 08/20/15   Carmon Ginsberg, PA  furosemide (LASIX) 40 MG tablet Take 1 tablet (40 mg total) by mouth daily. 08/04/15   Carmon Ginsberg, PA  gabapentin (NEURONTIN) 300 MG capsule Take 1 capsule (300 mg total) by mouth 2 (two) times daily. 08/04/15   Carmon Ginsberg, PA  OXYGEN Inhale into the lungs daily.    Historical Provider, MD  pravastatin (PRAVACHOL) 20 MG tablet Take 1 tablet (20 mg total) by mouth daily. 08/04/15   Carmon Ginsberg, PA  vitamin B-12 (CYANOCOBALAMIN) 1000 MCG tablet Take 1,000 mcg by mouth once a week.    Historical Provider, MD    Inpatient Medications:  . azithromycin  500 mg Intravenous Q24H  . budesonide (PULMICORT) nebulizer solution  0.5 mg Nebulization BID  . enoxaparin (LOVENOX) injection  40 mg Subcutaneous Q24H  . [START ON 04/22/2016] famotidine (PEPCID) IV  20 mg Intravenous Q24H  . insulin aspart  1-3 Units Subcutaneous Q4H  . ipratropium-albuterol  3 mL Nebulization Q6H  . methylPREDNISolone (SOLU-MEDROL) injection  60 mg Intravenous Q12H  . methylPREDNISolone sodium succinate      . ondansetron      . piperacillin-tazobactam (ZOSYN)  IV  3.375 g Intravenous Q8H   . sodium chloride 75 mL/hr at 04/21/16 0913  . diltiazem (CARDIZEM) infusion 5 mg/hr (04/21/16 1118)  . fentaNYL infusion INTRAVENOUS 75 mcg/hr (04/21/16 0913)  . norepinephrine (LEVOPHED) Adult infusion 5.013 mcg/min (04/21/16 0913)    Allergies:  Allergies  Allergen  Reactions  . Fenofibrate     Other reaction(s): Headache  . Lisinopril Cough  . Niacin     Other reaction(s): Dizziness  . Phenergan [Promethazine Hcl]     NVD  . Simvastatin     Other reaction(s): Headache    Social History   Social History  . Marital status: Married    Spouse name: N/A  . Number of children: N/A  . Years of education: N/A   Occupational History  . Not on file.   Social History Main Topics  . Smoking status: Former Smoker    Packs/day: 1.00    Years: 50.00    Types: Cigarettes    Quit date: 12/04/2003  . Smokeless tobacco: Current User    Types: Chew     Comment: Has been chewing tobacco since quitting smoking in 2005.  [name] Alcohol use No  . Drug use: No  . Sexual activity: Not on file   Other Topics Concern  . Not on file  Social History Narrative  . No narrative on file     Family History  Problem Relation Age of Onset  . Hypertension Mother      Review of Systems: Review of Systems  Unable to perform ROS: Intubated    Labs:  Recent Labs  04/21/16 0525  TROPONINI 0.04*   Lab Results  Component Value Date   WBC 18.8 (H) 04/21/2016   HGB 16.1 04/21/2016   HCT 50.0 04/21/2016   MCV 85.9 04/21/2016   PLT 324 04/21/2016     Recent Labs Lab 04/21/16 0525  NA 142  K 4.0  CL 104  CO2 29  BUN 15  CREATININE 1.87*  CALCIUM 8.7*  PROT 7.9  BILITOT 0.2*  ALKPHOS 102  ALT 16*  AST 23  GLUCOSE 178*   No results found for: CHOL, HDL, LDLCALC, TRIG No results found for: DDIMER  Radiology/Studies:  Dg Abdomen 1 View  Result Date: 04/21/2016 CLINICAL DATA:  Enteric tube placement. EXAM: ABDOMEN - 1 VIEW COMPARISON:  CT abdomen and pelvis 01/12/2015 FINDINGS: An enteric tube has been placed with tip in the left upper quadrant consistent with location in the upper stomach. Multiple superimposed lines and wires. These are likely extrinsic. Surgical clips in the right upper quadrant. Visualized bowel gas pattern is unremarkable.  Left pleural effusion with left pulmonary infiltrates. IMPRESSION: Enteric tube tip localizes to the left upper quadrant consistent with location in the upper stomach. Electronically Signed   By: Lucienne Capers M.D.   On: 04/21/2016 06:41   Dg Chest Portable 1 View  Result Date: 04/21/2016 CLINICAL DATA:  Central line placement EXAM: PORTABLE CHEST 1 VIEW COMPARISON:  04/21/2016 FINDINGS: Endotracheal tube remains in good position. Right jugular central venous catheter tip in the mid SVC without pneumothorax. NG tube enters the stomach Extensive infiltrate in the left upper lobe and left lower lobe is unchanged and compatible with pneumonia. Small left effusion. Right lung remains clear with minimal right lower lobe atelectasis IMPRESSION: Central venous catheter tip in the SVC without pneumothorax Endotracheal tube in good position Extensive pneumonia left upper lobe and left lower lobe is unchanged. Electronically Signed   By: Franchot Gallo M.D.   On: 04/21/2016 08:00   Dg Chest Portable 1 View  Result Date: 04/21/2016 CLINICAL DATA:  Endotracheal tube placement. Patient's iodine ache with gasping respirations. EXAM: PORTABLE CHEST 1 VIEW COMPARISON:  01/27/2016 FINDINGS: Endotracheal tube placed with tip measuring 3.4 cm above the carina. Enteric tube tip is off the field of view but below the left hemidiaphragm. There is interval development since the previous study of diffuse patchy infiltration throughout the left lung most likely represent pneumonia. Small left pleural effusion. Right lung appears clear. IMPRESSION: Appliances appear in satisfactory position. Diffuse infiltration throughout the left lung with small effusion likely representing pneumonia. Electronically Signed   By: Lucienne Capers M.D.   On: 04/21/2016 06:39    EKG: Interpreted by me showed: SVT, 151 bpm, nonspecific st/t changes Telemetry: Interpreted by me showed: predominantly sinus rhythm in the 70's with runs of what  appear to be SVT in the 130's bpm and PACs  Weights: Filed Weights   04/21/16 0607 04/21/16 0842  Weight: 200 lb (90.7 kg) 195 lb 1.7 oz (88.5 kg)     Physical Exam: Blood pressure (!) 82/57, pulse 68, temperature (!) 100.5 F (38.1 C), temperature source Oral, resp. rate (!) 28, height 5\' 7"  (1.702 m), weight 195 lb 1.7 oz (88.5 kg), SpO2 97 %.  Body mass index is 30.56 kg/m. General: Critically ill appearing, in no acute distress. Head: Normocephalic, atraumatic, sclera non-icteric, no xanthomas, nares are without discharge.  Neck: Negative for carotid bruits. JVD difficult to assess 2/2 right IJ. Lungs: Decreased breath sounds L>R. Intubated. Heart: RRR with S1 S2. No murmurs, rubs, or gallops appreciated. Abdomen: Soft, non-tender, non-distended with normoactive bowel sounds. No hepatomegaly. No rebound/guarding. No obvious abdominal masses. Msk:  Strength and tone appear normal for age. Extremities: No clubbing or cyanosis. 1+ bilateral pitting edema to the shins.  Neuro: Intubated and sedated. Psych:  Intubated and sedated.    Assessment and Plan:  Principal Problem:   Acute on chronic respiratory failure (HCC) Active Problems:   PNA (pneumonia)   Septic shock (HCC)   COPD (chronic obstructive pulmonary disease) (HCC)   Chronic diastolic heart failure (HCC)   Chronic kidney disease (CKD), stage III (moderate)   Atrial tachycardia ([place])    1. Acute on chronic respiratory failure: -Quite possibly multifactorial including extensive left upper lobe PNA, cannot rule on acute on chronic diastolic CHF -Intubated, wean as able per PCCM  2. SVT/atrial tachycardia: -Likely in the setting of PNA -Agree with Cardizem gtt for now (just started at time of cardiology consult) -If this does not decrease the rate of his ectopy may need amiodarone infusion  -Unable to titrate Cardizem further at this time 2/2 hypotension -Magnesium pending  -Check TSH  3. Septic shock in the  setting of PNA/COPD exacerbation: -Intubation per PCCM -ABX and pressors per PCCM  4. Acute on chronic diastolic CHF: -Check echo -Unable to place on BB at this time 2/2 hypotension with septic shock as well as acute on chronic respiratory failure -Volume status is difficult to assess -No Lasix at this time given hypotension and acute on CKD with history of nephrectomy   5. Acute on CKD stage III s/p nephrectomy: -Monitor renal function closely  -Cautious use of IV fluids   Signed, Christell Faith, PA-C [facility] Pager: [phone] 04/21/2016, 11:53 AM

## 2016-04-21 NOTE — ED Triage Notes (Signed)
respiratory distress pulled from car in triage.

## 2016-04-21 NOTE — Progress Notes (Addendum)
MEDICATION RELATED CONSULT NOTE   75 yo male ICU patient currently requiring mechanical ventilation, sedated on fentanyl. Plan is to extubate patient on 9/21.    Plan:   1. Antibiotics: Patient on Zosyn and Azithromycin 500mg  IV x 5 doses. Will continue vancomycin 1000mg  IV Q24hr for goal trough of 15-20.    2. Tachycardia: Patient initiated on diltiazem drip and cardiology consult placed.   3. Steroids: Patient received methylprednisilone 125mg  IV x 1 in ED. Per rounds, will order methylprednisilone 60mg  IV Q12hr for total of 3 days of IV steroids.   4. Electrolytes: no replacement warranted at this time. Will recheck electrolytes with am labs.   5. Home Medications: Patient's NG tube tointermittent suction. Will access home medications during am rounds.    6. Constipation prevention: Will consider adding senna/docusate in am.     Allergies  Allergen Reactions  . Fenofibrate     Other reaction(s): Headache  . Lisinopril Cough  . Niacin     Other reaction(s): Dizziness  . Phenergan [Promethazine Hcl]     NVD  . Simvastatin     Other reaction(s): Headache    Patient Measurements: Height: 5\' 7"  (170.2 cm) Weight: 195 lb 1.7 oz (88.5 kg) IBW/kg (Calculated) : 66.1  Vital Signs: Temp: 100.4 F (38 C) (09/21 1200) Temp Source: Axillary (09/21 1200) BP: 93/48 (09/21 1600) Pulse Rate: 81 (09/21 1600) Intake/Output from previous day: 09/20 0701 - 09/21 0700 In: 200 [IV Piggyback:200] Out: -  Intake/Output from this shift: Total I/O In: 2427.2 [I.V.:1356.2; IV Piggyback:1071] Out: -   Labs:  Recent Labs  04/21/16 0525  WBC 18.8*  HGB 16.1  HCT 50.0  PLT 324  APTT 25  CREATININE 1.87*  ALBUMIN 3.9  PROT 7.9  AST 23  ALT 16*  ALKPHOS 102  BILITOT 0.2*   Estimated Creatinine Clearance: 35.1 mL/min (by C-G formula based on SCr of 1.87 mg/dL (H)).   Microbiology: Recent Results (from the past 720 hour(s))  Blood culture (routine x 2)     Status: None  (Preliminary result)   Collection Time: 04/21/16  5:25 AM  Result Value Ref Range Status   Specimen Description BLOOD LEFT WRIST  Final   Special Requests   Final    BOTTLES DRAWN AEROBIC AND ANAEROBIC AER 9ML ANA 6ML   Culture NO GROWTH <12 HOURS  Final   Report Status PENDING  Incomplete  Rapid Influenza A&B Antigens ([place] only)     Status: None   Collection Time: 04/21/16  6:34 AM  Result Value Ref Range Status   Influenza A ([place]) NEGATIVE NEGATIVE Final   Influenza B ([facility]) NEGATIVE NEGATIVE Final  MRSA PCR Screening     Status: Abnormal   Collection Time: 04/21/16  8:42 AM  Result Value Ref Range Status   MRSA by PCR POSITIVE (A) NEGATIVE Final    Comment:        The GeneXpert MRSA Assay (FDA approved for NASAL specimens only), is one component of a comprehensive MRSA colonization surveillance program. It is not intended to diagnose MRSA infection nor to guide or monitor treatment for MRSA infections. RESULT CALLED TO, READ BACK BY AND VERIFIED WITH: NIA DILE 04/21/16 1051 [place] will continue to monitor and adjust per consult.   Aliyanah Rozas L 04/21/2016,4:35 PM

## 2016-04-21 NOTE — Progress Notes (Signed)
eLink Physician-Brief Progress Note Patient Name: BERTHA ZANIN DOB: 1940-11-27 MRN: [id number]   Date of Service  04/21/2016  HPI/Events of Note  Blood glucose = 340.   eICU Interventions  Will order: 1. D/C Q 4 hour sensitive Novolog SSI. 2. Q 4 hour moderate Novolog SSI.      Intervention Category Intermediate Interventions: Hyperglycemia - evaluation and treatment  Mikenna Bunkley [place] 04/21/2016, 5:22 PM

## 2016-04-21 NOTE — Progress Notes (Signed)
eLink Physician-Brief Progress Note Patient Name: Bobby Peterson DOB: 1941-07-14 MRN: [id number]   Date of Service  04/21/2016  HPI/Events of Note  75 yo with acute resp failure from acute COPD exacerbation with Left Lung pneumonia  eICU Interventions  Admit to ICU Vent and vasopressors support     Intervention Category Evaluation Type: New Patient Evaluation  [name] 04/21/2016, 6:23 AM

## 2016-04-21 NOTE — Progress Notes (Signed)
eLink Physician-Brief Progress Note Patient Name: Bobby Peterson DOB: [birth year] MRN: [id number]   Date of Service  04/21/2016  HPI/Events of Note  Multiple issues: 1. Fentanyl IV infusion not able to be titrated and 2. Nurse request Fentanyl PRN bolus from infusion orders.   eICU Interventions  Will order: 1. Fentanyl IV infusion. Titrate to RASS = 0 to -1.  2. Fentanyl 50 mcg IV bolus from infuion Q 1 hour PRN.     Intervention Category Intermediate Interventions: Pain - evaluation and management Minor Interventions: Agitation / anxiety - evaluation and management  Lysle Dingwall 04/21/2016, 9:37 PM

## 2016-04-21 NOTE — Progress Notes (Signed)
Initial Nutrition Assessment  DOCUMENTATION CODES:   Obesity unspecified  INTERVENTION:  Recommend Vital High Protein @ 53mL/hr - trickle feeds Goal 76mL/hr via OGT, provides 1320 calories, 116gm protein, and 1104cc free water at goal rate.  NUTRITION DIAGNOSIS:   Inadequate oral intake related to inability to eat as evidenced by NPO status.  GOAL:   Provide needs based on ASPEN/SCCM guidelines  MONITOR:   Labs, Vent status, Weight trends, I & O's, Skin, TF tolerance  REASON FOR ASSESSMENT:   Ventilator    ASSESSMENT:    Bobby Peterson is a 75 yo male with h/o COPD, CHF, CA and ,Hypertension.  Patient presents to the ED for evaluation of vomiting and shortness of breath noted by his wife earlier this morning.  Wife reports that he woke up vomiting and was short of breath, intubated for airway protection.  Patient is currently intubated on ventilator support MV: 13.9 L/min Temp (24hrs), Avg:101 F (38.3 C), Min:100.5 F (38.1 C), Max:101.4 F (38.6 C)  Propofol: None Discussed at rounds Pt had NGT to LIWS today, suctioning out mostly bile May start trickle feeds this afternoon per Dr. Melina Schools COPD, L Sided PNA, Septic Shock Labs and Medications reviewed: CBGs 222 Solumedrol, Cardizem drip, Fentanyl drip, Levo drip, NS @ 65mL/hr  Diet Order:  Diet NPO time specified  Skin:  Reviewed, no issues  Last BM:  PTA  Height:   Ht Readings from Last 1 Encounters:  04/21/16 5\' 7"  (1.702 m)    Weight:   Wt Readings from Last 1 Encounters:  04/21/16 195 lb 1.7 oz (88.5 kg)    Ideal Body Weight:  67.27 kg  BMI:  Body mass index is 30.56 kg/m.  Estimated Nutritional Needs:   Kcal:  [id number]  Protein:  113 gm  Fluid:  >/= 1.2L  EDUCATION NEEDS:   No education needs identified at this time  Satira Anis. Yamira Papa, MS, RD LDN Inpatient Clinical Dietitian Pager [phone]

## 2016-04-21 NOTE — ED Provider Notes (Addendum)
[facility] Emergency Department Provider Note   ____________________________________________   First MD Initiated Contact with Patient 04/21/16 [phone]     (approximate)  I have reviewed the triage vital signs and the nursing notes.   HISTORY  Chief Complaint Shortness of Breath  Caveat-history of present illness and review of systems is limited due to the patient's severe respiratory distress. Information is obtained partly from his wife at bedside.  HPI Bobby Peterson is a 75 y.o. male with history of COPD, CHF, coronary artery disease, hypertension who presents for evaluation of vomiting and shortness of breath noted by his wife earlier this morning. Wife reports that he woke up vomiting and was short of breath, he has been coughing. She denies any fevers. Patient arrived in severe respiratory distress to the hospital and was pulled from the vehicle by staff and brought back emergently to bed 15.   Past Medical History:  Diagnosis Date  . Cancer ([place])    kidney  . CHF (congestive heart failure) ([place])   . COPD (chronic obstructive pulmonary disease) ([place])   . Coronary artery disease   . Diabetes ([place])   . Heart failure ([place])   . Hernia, umbilical   . Hypercholesteremia   . Hypertension   . Kidney disease    1 Kidney  . Oxygen decrease     Patient Active Problem List   Diagnosis Date Noted  . Allergic rhinitis 07/08/2015  . 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 Coronary atherosclerosis 07/08/2015  . Chronic diastolic heart failure ([place]) 07/08/2015  . Chronic kidney disease (CKD), stage III (moderate) 07/08/2015  . Elevated blood sugar 07/08/2015  . Esophageal reflux 07/08/2015  . HLD (hyperlipidemia) 07/08/2015  . BP (high blood pressure) 07/08/2015  . Calculus of kidney 07/08/2015  . COPD (chronic obstructive pulmonary disease) ([place]) 12/03/2013  . Chronic hypoxemic respiratory failure ([place])  12/03/2013    Past Surgical History:  Procedure Laterality Date  . CHOLECYSTECTOMY N/A 12/22/2014   Procedure: LAPAROSCOPIC CHOLECYSTECTOMY WITH INTRAOPERATIVE CHOLANGIOGRAM;  Surgeon: Christene Lye, MD;  Location: [facility] ORS;  Service: General;  Laterality: N/A;  . COLONOSCOPY    . ERCP N/A 01/05/2015   Procedure: ENDOSCOPIC RETROGRADE CHOLANGIOPANCREATOGRAPHY (ERCP);  Surgeon: Hulen Luster, MD;  Location[facility] ENDOSCOPY;  Service: Endoscopy;  Laterality: N/A;  . ERCP N/A 02/19/2015   Procedure: ENDOSCOPIC RETROGRADE CHOLANGIOPANCREATOGRAPHY (ERCP);  Surgeon: Hulen Luster, MD;  Location: [facility] ENDOSCOPY;  Service: Gastroenterology;  Laterality: N/A;  . GALLBLADDER SURGERY  11/2014  . NEPHRECTOMY  1990  . OMENTECTOMY  12/22/2014   Procedure: OMENTECTOMY;  Surgeon: Christene Lye, MD;  Location: [facility] ORS;  Service: General;;  Partial omentectomy  . STENT REMOVAL    . UMBILICAL HERNIA REPAIR N/A 12/22/2014   Procedure: HERNIA REPAIR UMBILICAL ADULT;  Surgeon: Christene Lye, MD;  Location: [facility] ORS;  Service: General;  Laterality: N/A;    Prior to Admission medications   Medication Sig Start Date End Date Taking? Authorizing Provider  aspirin 81 MG tablet Take 81 mg by mouth daily.    Historical Provider, MD  azithromycin (ZITHROMAX Z-PAK) 250 MG tablet Take two the first day then one daily for 4 days. 02/12/16   Carmon Ginsberg, PA  doxycycline (VIBRAMYCIN) 100 MG capsule  12/31/15   Historical Provider, MD  Fluticasone-Salmeterol (ADVAIR DISKUS) 250-50 MCG/DOSE AEPB Inhale 1 puff into the lungs 2 (two) times daily. 08/20/15   Carmon Ginsberg, PA  furosemide (  LASIX) 40 MG tablet Take 1 tablet (40 mg total) by mouth daily. 08/04/15   Carmon Ginsberg, PA  gabapentin (NEURONTIN) 300 MG capsule Take 1 capsule (300 mg total) by mouth 2 (two) times daily. 08/04/15   Carmon Ginsberg, PA  losartan (COZAAR) 50 MG tablet TAKE 1 TABLET EVERY DAY 08/06/15   Carmon Ginsberg, PA  metoprolol succinate  (TOPROL-XL) 50 MG 24 hr tablet Take 1 tablet (50 mg total) by mouth daily. 08/04/15   Carmon Ginsberg, PA  omeprazole (PRILOSEC) 40 MG capsule Take 1 capsule (40 mg total) by mouth daily. 08/04/15   Carmon Ginsberg, PA  OXYGEN Inhale into the lungs daily.    Historical Provider, MD  pravastatin (PRAVACHOL) 20 MG tablet Take 1 tablet (20 mg total) by mouth daily. 08/04/15   Carmon Ginsberg, PA  tamsulosin (FLOMAX) 0.4 MG CAPS capsule Take 1 capsule (0.4 mg total) by mouth daily. 08/04/15   Carmon Ginsberg, PA  vitamin B-12 (CYANOCOBALAMIN) 1000 MCG tablet Take 1,000 mcg by mouth once a week.    Historical Provider, MD    Allergies Fenofibrate; Lisinopril; Niacin; Phenergan [promethazine hcl]; and Simvastatin  No family history on file.  Social History Social History  Substance Use Topics  . Smoking status: Former Smoker    Packs/day: 1.00    Years: 50.00    Types: Cigarettes    Quit date: 12/04/2003  . Smokeless tobacco: Current User    Types: Chew     Comment: Has been chewing tobacco since quitting smoking in 2005.  [name] Alcohol use No    Review of Systems    ____________________________________________   PHYSICAL EXAM:  Vitals:   04/21/16 0600 04/21/16 0607 04/21/16 0611 04/21/16 0630  BP: (!) 78/61  (!) 78/57 (!) 85/60  Pulse: (!) 142  (!) 139 (!) 138  Resp:   (!) 27 (!) 28  SpO2: 98%  100% 99%  Weight:  200 lb (90.7 kg)     VITAL SIGNS: ED Triage Vitals [04/21/16 0527]  Enc Vitals Group     BP      Pulse Rate (!) 155     Resp      Temp      Temp src      SpO2 100 %     Weight      Height      Head Circumference      Peak Flow      Pain Score      Pain Loc      Pain Edu?      Excl. in [place]?     Constitutional: Awake, responds to noxious stimuli and answers"what?" when his name is called. Severe respiratory distress. Eyes: Conjunctivae are normal. PERRL. EOMI. Head: Atraumatic. Nose: No congestion/rhinnorhea. Mouth/Throat: Mucous membranes are moist.  Oropharynx  non-erythematous. Neck: No stridor.  Appears supple without meningismus. Cardiovascular: Tachycardic rate, regular rhythm. Grossly normal heart sounds.  Good peripheral circulation. Respiratory: Severely tachypneic with increased work of breathing, prolonged expiratory phase, diminished air movement bilaterally. Gastrointestinal: Soft and nontender. No distention.  No CVA tenderness. Genitourinary: deferred Musculoskeletal: No lower extremity tenderness nor edema.  No joint effusions. Neurologic:  Normal speech and language. No gross focal neurologic deficits are appreciated. No gait instability. Skin:  Skin is warm, dry and intact. No rash noted. Psychiatric: Mood and affect are normal. Speech and behavior are normal.  ____________________________________________   LABS (all labs ordered are listed, but only abnormal results are displayed)  Labs Reviewed  BLOOD GAS,  ARTERIAL - Abnormal; Notable for the following:       Result Value   pH, Arterial 7.28 (*)    pCO2 arterial 52 (*)    pO2, Arterial 236 (*)    Acid-base deficit 3.1 (*)    Allens test (pass/fail) POSITIVE (*)    All other components within normal limits  CBC WITH DIFFERENTIAL/PLATELET - Abnormal; Notable for the following:    WBC 18.8 (*)    RDW 15.9 (*)    Neutro Abs 16.0 (*)    Monocytes Absolute 0.1 (*)    All other components within normal limits  COMPREHENSIVE METABOLIC PANEL - Abnormal; Notable for the following:    Glucose, Bld 178 (*)    Creatinine, Ser 1.87 (*)    Calcium 8.7 (*)    ALT 16 (*)    Total Bilirubin 0.2 (*)    GFR calc non Af Amer 33 (*)    GFR calc Af Amer 38 (*)    All other components within normal limits  TROPONIN I - Abnormal; Notable for the following:    Troponin I 0.04 (*)    All other components within normal limits  LACTIC ACID, PLASMA - Abnormal; Notable for the following:    Lactic Acid, Venous 3.1 (*)    All other components within normal limits  CULTURE, BLOOD (ROUTINE X  2)  CULTURE, BLOOD (ROUTINE X 2)  RAPID INFLUENZA A&B ANTIGENS ([facility] ONLY)  PROTIME-INR  APTT  LIPASE, BLOOD  BRAIN NATRIURETIC PEPTIDE  LACTIC ACID, PLASMA  URINALYSIS COMPLETEWITH MICROSCOPIC ([facility] ONLY)   ____________________________________________  EKG  ED ECG REPORT I, Joanne Gavel, the attending physician, personally viewed and interpreted this ECG.   Date: 04/21/2016  EKG Time: 05:46  Rate: 139  Rhythm: sinus tachycardia  Axis: normal  Intervals:none  ST&T Change: ST depression in the inferior leads as well as V5 and V6. No acute ST elevation.  ED ECG REPORT I, Joanne Gavel, the attending physician, personally viewed and interpreted this ECG.   Date: 04/21/2016  EKG Time: 06:05  Rate: 144  Rhythm: sinus tachycardia  Axis: normal  Intervals:none  ST&T Change: ST depression in lead 1, lead 2, lead 3, aVF, V4, V5, V6.    ____________________________________________  RADIOLOGY  CXR IMPRESSION:  Appliances appear in satisfactory position. Diffuse infiltration  throughout the left lung with small effusion likely representing  pneumonia.     KUB IMPRESSION:  Enteric tube tip localizes to the left upper quadrant consistent  with location in the upper stomach.    ____________________________________________   PROCEDURES  Procedure(s) performed:   INTUBATION Performed by: Loura Pardon A  Required items: required blood products, implants, devices, and special equipment available Patient identity confirmed: provided demographic data and hospital-assigned identification number Time out: Immediately prior to procedure a "time out" was called to verify the correct patient, procedure, equipment, support staff and site/side marked as required.  Indications: airway protection, ventilation  Intubation method: Glidescope Laryngoscopy   Preoxygenation: BVM  Sedatives: Etomidate Paralytic: Succinylcholine  Tube Size: 7.5 cuffed  Post-procedure  assessment: chest rise and ETCO2 monitor Breath sounds: equal and absent over the epigastrium Tube secured with: ETT holder Chest x-ray interpreted by radiologist and me.  Chest x-ray findings: endotracheal tube in appropriate position  Patient tolerated the procedure well with no immediate complications.    Procedures  Critical Care performed: Yes, see critical care note(s).  CRITICAL CARE Performed by: Loura Pardon A   Total critical care time: 66  minutes  Critical care time was exclusive of separately billable procedures and treating other patients.  Critical care was necessary to treat or prevent imminent or life-threatening deterioration.  Critical care was time spent personally by me on the following activities: development of treatment plan with patient and/or surrogate as well as nursing, discussions with consultants, evaluation of patient's response to treatment, examination of patient, obtaining history from patient or surrogate, ordering and performing treatments and interventions, ordering and review of laboratory studies, ordering and review of radiographic studies, pulse oximetry and re-evaluation of patient's condition.  ____________________________________________   INITIAL IMPRESSION / ASSESSMENT AND PLAN / ED COURSE  Pertinent labs & imaging results that were available during my care of the patient were reviewed by me and considered in my medical decision making (see chart for details).  Bobby Peterson is a 75 y.o. male with history of COPD, CHF, coronary artery disease, hypertension who presents for evaluation of vomiting and shortness of breath noted by his wife earlier this morning. On arrival to the emergency department, he is in severe respiratory distress and confused, with prolonged respiratory phase which seems consistent with COPD exacerbation.[name] He was hypoxic to 74%. BiPAP initiated and he received 125 mg of Solu-Medrol however soon after, the patient  developed vomiting, unable to protect his airway. I discussed the need for intubation with his wife and she agreed so we proceeded. Patient was intubated successfully for airway protection. OG and Foley placed. EKG shows supraventricular tachycardia with initial heart rate of 155 bpm. Clinically, his heart rate appeared to be behaving like atrial flutter with a 2-1 AV conduction delay however after 6 mg of adenosine and then 12 mg of adenosine, no underlying flutter waves revealed therefore suspect that the rhythm is actually sinus tachycardia. Patient is meeting multiple sirs criteria, code sepsis initiated, will give liberal IV fluids, Vancomycin and Zosyn. We'll obtain screening labs, discuss the case with the ICU for admission. Continue his continuous cardiac monitoring.  ----------------------------------------- 6:53 AM on 04/21/2016 ----------------------------------------- CBC shows marked leukocytosis, white blood cell count elevated at 18.8. Lactic acid is elevated at 3.1. Creatinine also elevated at 1.87. EKG shows ST depressions in the inferior and lateral leads, troponin is mildly elevated at 0.04. Chest x-ray concerning for left-sided pneumonia which is the most likely cause of his sepsis. Patient has become hypotensive now requiring continuous levophed infusion for blood pressure support. I discussed the case with Dr. Jenell Milliner of the ICU and the patient will be admitted. Nurse practitioner Cherre Robins is currently at the bedside placing a central line.  ED Sepsis - Repeat Assessment   Performed at:    7:01 a,  Last Vitals:    Blood pressure (!) 90/64, pulse (!) 145, resp. rate (!) 28, weight 200 lb (90.7 kg), SpO2 99 %.  Heart:       Tachycardic with regular rhythm  Lungs:     Ventilated breath sounds bilaterally, diminished in the left.  Capillary Refill:   Tuesday capillary refill in the fingertips  Peripheral Pulse (include location): Radial, 2+   Skin (include color):   Pink, warm and  dry  Clinical Course     ____________________________________________   FINAL CLINICAL IMPRESSION(S) / ED DIAGNOSES  Final diagnoses:  Acute respiratory failure with hypoxia ([place])  Community acquired pneumonia  Sepsis, due to unspecified organism ([place])      NEW MEDICATIONS STARTED DURING THIS VISIT:  New Prescriptions   No medications on file     Note:  This  document was prepared using Systems analyst and may include unintentional dictation errors.    Joanne Gavel, MD 04/21/16 [place], MD 04/21/16 [phone]

## 2016-04-22 DIAGNOSIS — J432 Centrilobular emphysema: Secondary | ICD-10-CM

## 2016-04-22 DIAGNOSIS — J189 Pneumonia, unspecified organism: Secondary | ICD-10-CM

## 2016-04-22 DIAGNOSIS — A419 Sepsis, unspecified organism: Secondary | ICD-10-CM

## 2016-04-22 DIAGNOSIS — J181 Lobar pneumonia, unspecified organism: Secondary | ICD-10-CM

## 2016-04-22 LAB — URINE CULTURE: CULTURE: NO GROWTH

## 2016-04-22 LAB — BASIC METABOLIC PANEL
ANION GAP: 8 (ref 5–15)
BUN: 24 mg/dL — ABNORMAL HIGH (ref 6–20)
CO2: 22 mmol/L (ref 22–32)
Calcium: 7.4 mg/dL — ABNORMAL LOW (ref 8.9–10.3)
Chloride: 108 mmol/L (ref 101–111)
Creatinine, Ser: 2.28 mg/dL — ABNORMAL HIGH (ref 0.61–1.24)
GFR, EST AFRICAN AMERICAN: 30 mL/min — AB (ref >60)
GFR, EST NON AFRICAN AMERICAN: 26 mL/min — AB (ref >60)
Glucose, Bld: 264 mg/dL — ABNORMAL HIGH (ref 65–99)
POTASSIUM: 4.4 mmol/L (ref 3.5–5.1)
SODIUM: 138 mmol/L (ref 135–145)

## 2016-04-22 LAB — CBC
HCT: 40 % (ref 40.0–52.0)
Hemoglobin: 13.3 g/dL (ref 13.0–18.0)
MCH: 28.2 pg (ref 26.0–34.0)
MCHC: 33.2 g/dL (ref 32.0–36.0)
MCV: 85 fL (ref 80.0–100.0)
PLATELETS: 267 10*3/uL (ref 150–440)
RBC: 4.71 MIL/uL (ref 4.40–5.90)
RDW: 16.1 % — ABNORMAL HIGH (ref 11.5–14.5)
WBC: 37.5 10*3/uL — AB (ref 3.8–10.6)

## 2016-04-22 LAB — BLOOD GAS, ARTERIAL
Acid-base deficit: 5.3 mmol/L — ABNORMAL HIGH (ref 0.0–2.0)
Bicarbonate: 21.6 mmol/L (ref 20.0–28.0)
FIO2: 0.4
MECHANICAL RATE: 28
O2 SAT: 91 %
PATIENT TEMPERATURE: 37
PCO2 ART: 46 mmHg (ref 32.0–48.0)
PEEP/CPAP: 5 cmH2O
PO2 ART: 69 mmHg — AB (ref 83.0–108.0)
VT: 500 mL
pH, Arterial: 7.28 — ABNORMAL LOW (ref 7.350–7.450)

## 2016-04-22 LAB — PROCALCITONIN: PROCALCITONIN: 86.43 ng/mL

## 2016-04-22 LAB — GLUCOSE, CAPILLARY
GLUCOSE-CAPILLARY: 119 mg/dL — AB (ref 65–99)
GLUCOSE-CAPILLARY: 174 mg/dL — AB (ref 65–99)
GLUCOSE-CAPILLARY: 232 mg/dL — AB (ref 65–99)
Glucose-Capillary: 184 mg/dL — ABNORMAL HIGH (ref 65–99)
Glucose-Capillary: 131 mg/dL — ABNORMAL HIGH (ref 65–99)
Glucose-Capillary: 135 mg/dL — ABNORMAL HIGH (ref 65–99)

## 2016-04-22 LAB — TROPONIN I
Troponin I: 0.03 ng/mL (ref <0.03)
Troponin I: <0.03 ng/mL (ref <0.03)

## 2016-04-22 LAB — PHOSPHORUS: Phosphorus: 3.1 mg/dL (ref 2.5–4.6)

## 2016-04-22 LAB — ECHOCARDIOGRAM COMPLETE
HEIGHTINCHES: 67 in
WEIGHTICAEL: 3121.71 [oz_av]

## 2016-04-22 LAB — MAGNESIUM: Magnesium: 1.5 mg/dL — ABNORMAL LOW (ref 1.7–2.4)

## 2016-04-22 MED ORDER — NICOTINE 14 MG/24HR TD PT24
14.0000 mg | MEDICATED_PATCH | Freq: Every day | TRANSDERMAL | Status: DC
  Administered 2016-04-23 – 2016-04-26 (×5): 14 mg via TRANSDERMAL
  Filled 2016-04-22 (×6): qty 1

## 2016-04-22 MED ORDER — ENOXAPARIN SODIUM 30 MG/0.3ML ~~LOC~~ SOLN
30.0000 mg | SUBCUTANEOUS | Status: DC
  Administered 2016-04-22: 30 mg via SUBCUTANEOUS
  Filled 2016-04-22: qty 0.3

## 2016-04-22 MED ORDER — METHYLPREDNISOLONE SODIUM SUCC 40 MG IJ SOLR
10.0000 mg | Freq: Every day | INTRAMUSCULAR | Status: DC
Start: 2016-05-02 — End: 2016-04-26

## 2016-04-22 MED ORDER — METHYLPREDNISOLONE SODIUM SUCC 40 MG IJ SOLR: 20.0000 mg | Freq: Every day | INTRAMUSCULAR | Status: DC

## 2016-04-22 MED ORDER — METHYLPREDNISOLONE SODIUM SUCC 40 MG IJ SOLR
30.0000 mg | Freq: Every day | INTRAMUSCULAR | Status: DC
  Administered 2016-04-26: 30 mg via INTRAVENOUS
  Filled 2016-04-22: qty 1

## 2016-04-22 MED ORDER — METHYLPREDNISOLONE 4 MG PO TABS: 20.0000 mg | ORAL_TABLET | Freq: Every day | ORAL | Status: DC

## 2016-04-22 MED ORDER — METHYLPREDNISOLONE 4 MG PO TABS: 30.0000 mg | ORAL_TABLET | Freq: Every day | ORAL | Status: DC

## 2016-04-22 MED ORDER — METHYLPREDNISOLONE 4 MG PO TABS: 40.0000 mg | ORAL_TABLET | Freq: Every day | ORAL | Status: DC

## 2016-04-22 MED ORDER — SODIUM CHLORIDE 0.9 % IV SOLN
2.0000 g | Freq: Once | INTRAVENOUS | Status: AC
  Administered 2016-04-22: 2 g via INTRAVENOUS
  Filled 2016-04-22: qty 20

## 2016-04-22 MED ORDER — METHYLPREDNISOLONE SODIUM SUCC 40 MG IJ SOLR
40.0000 mg | Freq: Every day | INTRAMUSCULAR | Status: AC
  Administered 2016-04-23 – 2016-04-25 (×3): 40 mg via INTRAVENOUS
  Filled 2016-04-22 (×3): qty 1

## 2016-04-22 MED ORDER — METHYLPREDNISOLONE 4 MG PO TABS: 10.0000 mg | ORAL_TABLET | Freq: Every day | ORAL | Status: DC

## 2016-04-22 MED ORDER — MAGNESIUM SULFATE 2 GM/50ML IV SOLN
2.0000 g | Freq: Once | INTRAVENOUS | Status: AC
  Administered 2016-04-22: 2 g via INTRAVENOUS
  Filled 2016-04-22: qty 50

## 2016-04-22 NOTE — Progress Notes (Signed)
Per Dr. Merian Capron order, the pt. Was extubated. He was suctioned prior to extubation for a small amount of thick tan secretions. He has a strong cough. He was extubated without incident and placed on 3L nasal cannula. No stridor was heard and he is voicing.

## 2016-04-22 NOTE — Progress Notes (Addendum)
MEDICATION RELATED CONSULT NOTE   75 yo male ICU patient currently requiring mechanical ventilation, sedated on fentanyl. Plan is to extubate patient on 9/21.    Plan:   1. Antibiotics: Patient on Zosyn and Azithromycin 500mg  IV x 5 doses. Will continue vancomycin 1000mg  IV Q24hr for goal trough of 15-20. Trough with 4th dose. SCr increasing so may need to decrease dose before trough.   2. Electrolytes: Magnesium sulfate 2 g iv once and calcium gluconate 2 g iv once and f/u AM labs.   3. Constipation prevention: Fentanyl d/c. Will need to follow BM, plans for nutrition, and add bowel regimen as needed.   Allergies  Allergen Reactions  . Fenofibrate     Other reaction(s): Headache  . Lisinopril Cough  . Niacin     Other reaction(s): Dizziness  . Phenergan [Promethazine Hcl]     NVD  . Simvastatin     Other reaction(s): Headache    Patient Measurements: Height: 5\' 7"  (170.2 cm) Weight: 192 lb 14.4 oz (87.5 kg) IBW/kg (Calculated) : 66.1  Vital Signs: Temp: 99.4 F (37.4 C) (09/22 0800) Temp Source: Oral (09/22 0800) BP: 128/58 (09/22 1100) Pulse Rate: 112 (09/22 1100) Intake/Output from previous day: 09/21 0701 - 09/22 0700 In: 4630.3 [I.V.:3259.3; IV Piggyback:1371] Out: [id number] [Urine:527; Emesis/NG output:385] Intake/Output from this shift: Total I/O In: 441.2 [I.V.:241.2; IV Piggyback:200] Out: 140 [Urine:40; Emesis/NG output:100]  Labs:  Recent Labs  04/21/16 0525 04/22/16 0407  WBC 18.8* 37.5*  HGB 16.1 13.3  HCT 50.0 40.0  PLT 324 267  APTT 25  --   CREATININE 1.87* 2.28*  MG  --  1.5*  PHOS  --  3.1  ALBUMIN 3.9  --   PROT 7.9  --   AST 23  --   ALT 16*  --   ALKPHOS 102  --   BILITOT 0.2*  --    Estimated Creatinine Clearance: 28.7 mL/min (by C-G formula based on SCr of 2.28 mg/dL (H)).   Microbiology: Recent Results (from the past 720 hour(s))  Blood culture (routine x 2)     Status: None (Preliminary result)   Collection Time: 04/21/16  5:25  AM  Result Value Ref Range Status   Specimen Description BLOOD LEFT WRIST  Final   Special Requests   Final    BOTTLES DRAWN AEROBIC AND ANAEROBIC AER 9ML ANA 6ML   Culture NO GROWTH 1 DAY  Final   Report Status PENDING  Incomplete  Blood culture (routine x 2)     Status: None (Preliminary result)   Collection Time: 04/21/16  5:25 AM  Result Value Ref Range Status   Specimen Description BLOOD RIGHT FORE ARM  Final   Special Requests   Final    BOTTLES DRAWN AEROBIC AND ANAEROBIC AER 4ML ANA 3ML   Culture NO GROWTH 1 DAY  Final   Report Status PENDING  Incomplete  Urine culture     Status: None   Collection Time: 04/21/16  6:30 AM  Result Value Ref Range Status   Specimen Description URINE, RANDOM  Final   Special Requests NONE  Final   Culture NO GROWTH Performed at [facility]   Final   Report Status 04/22/2016 FINAL  Final  Rapid Influenza A&B Antigens ([place] only)     Status: None   Collection Time: 04/21/16  6:34 AM  Result Value Ref Range Status   Influenza A ([facility]) NEGATIVE NEGATIVE Final   Influenza B ([facility]) NEGATIVE NEGATIVE Final  MRSA PCR Screening     Status: Abnormal   Collection Time: 04/21/16  8:42 AM  Result Value Ref Range Status   MRSA by PCR POSITIVE (A) NEGATIVE Final    Comment:        The GeneXpert MRSA Assay (FDA approved for NASAL specimens only), is one component of a comprehensive MRSA colonization surveillance program. It is not intended to diagnose MRSA infection nor to guide or monitor treatment for MRSA infections. RESULT CALLED TO, READ BACK BY AND VERIFIED WITH: NIA DILE 04/21/16 1051 SGD   Culture, respiratory (NON-Expectorated)     Status: None (Preliminary result)   Collection Time: 04/21/16 12:01 PM  Result Value Ref Range Status   Specimen Description TRACHEAL ASPIRATE  Final   Special Requests NONE  Final   Gram Stain   Final    ABUNDANT WBC PRESENT,BOTH PMN AND MONONUCLEAR RARE GRAM POSITIVE COCCI IN PAIRS RARE GRAM  NEGATIVE RODS RARE YEAST Performed at [place]  Final   Report Status PENDING  Incomplete   Pharmacy will continue to monitor and adjust per consult.   Ulice Dash D 04/22/2016,11:35 AM

## 2016-04-22 NOTE — Progress Notes (Signed)
Update: Patient clinically stable, no respiratory distress. Still with intermittent sinus tach, followed by cards, on dilt gtt @15 , but now HR coming down to 90s NSR, will reduce dilt gtt to 10.   Vilinda Boehringer, MD [place] [facility] Pager [phone] (please enter 7-digits) On Call Pager - [phone] (please enter 7-digits)

## 2016-04-22 NOTE — Progress Notes (Signed)
SUBJECTIVE:  The patient is still intubated but he is awake and unresponsive. He continues to be in sinus tachycardia with frequent PACs.   Vitals:   04/22/16 0630 04/22/16 0645 04/22/16 0700 04/22/16 0800  BP: 102/74 113/60 (!) 114/55 111/62  Pulse: (!) 157 (!) 106 (!) 109 94  Resp: 14 (!) 22 19 15   Temp:      TempSrc:      SpO2: 96% 94% 95% 95%  Weight:      Height:        Intake/Output Summary (Last 24 hours) at 04/22/16 0818 Last data filed at 04/22/16 0800  Gross per 24 hour  Intake          4018.67 ml  Output              912 ml  Net          3106.67 ml    LABS: Basic Metabolic Panel:  Recent Labs  04/21/16 0525 04/22/16 0407  NA 142 138  K 4.0 4.4  CL 104 108  CO2 29 22  GLUCOSE 178* 264*  BUN 15 24*  CREATININE 1.87* 2.28*  CALCIUM 8.7* 7.4*  MG  --  1.5*  PHOS  --  3.1   Liver Function Tests:  Recent Labs  04/21/16 0525  AST 23  ALT 16*  ALKPHOS 102  BILITOT 0.2*  PROT 7.9  ALBUMIN 3.9    Recent Labs  04/21/16 0525  LIPASE 33   CBC:  Recent Labs  04/21/16 0525 04/22/16 0407  WBC 18.8* 37.5*  NEUTROABS 16.0*  --   HGB 16.1 13.3  HCT 50.0 40.0  MCV 85.9 85.0  PLT 324 267   Cardiac Enzymes:  Recent Labs  04/21/16 1814 04/21/16 2305 04/22/16 0407  TROPONINI 0.03* 0.06* 0.03*   BNP: Invalid input(s): POCBNP D-Dimer: No results for input(s): DDIMER in the last 72 hours. Hemoglobin A1C: No results for input(s): HGBA1C in the last 72 hours. Fasting Lipid Panel: No results for input(s): CHOL, HDL, LDLCALC, TRIG, CHOLHDL, LDLDIRECT in the last 72 hours. Thyroid Function Tests:  Recent Labs  04/21/16 1150  TSH 1.556   Anemia Panel: No results for input(s): VITAMINB12, FOLATE, FERRITIN, TIBC, IRON, RETICCTPCT in the last 72 hours.   PHYSICAL EXAM General: Well developed, well nourished, in no acute distress HEENT:  Normocephalic and atramatic Neck:  No JVD.  Lungs: crackles mostly on the left side. Heart: HRRR,  tachycardic . Normal S1 and S2 without gallops or murmurs.  Abdomen: Bowel sounds are positive, abdomen soft and non-tender  Msk:  Back normal, normal gait. Normal strength and tone for age. Extremities: No clubbing, cyanosis or edema.   Neuro: Alert and oriented X 3. Psych:  Good affect, responds appropriately  TELEMETRY: Reviewed telemetry pt in sinus tachycardia with PACs . Heart rate is between 100 110 bpm Chest x-ray was reviewed Which showed extensive infiltrate involving the left lung  ASSESSMENT AND PLAN:  1. Acute respiratory failure due to extensive left upper lobe pneumonia. Continue supportive care.  2. Tachycardia: Seems to be mostly sinus tachycardia with PACs. Short runs of atrial tachycardia cannot be completely excluded. This is likely due to hyperadrenergic state related to respiratory failure. He is currently on small dose diltiazem drip at 5 mg per hour. This can be continued for now. Continue treatment of underlying condition. An echocardiogram was ordered but is still pending. During tachycardia, his EKG did show extensive ST depression. However, troponin has been only  borderline elevated.  Kathlyn [place], MD, [facility] 04/22/2016 8:18 AM

## 2016-04-22 NOTE — Plan of Care (Signed)
Problem: Education: Goal: Knowledge of Bobby Peterson General Education information/materials will improve Outcome: Progressing Pt oriented to place and equipment, pt intubated and sedated. Able to follow commands with sedation on, pt denies pain. RASS goal 0 to -1. PRN meds available for vent synchronia, fent cont at 175mcg/hour.   Problem: Safety: Goal: Ability to remain free from injury will improve Outcome: Progressing Cont abx per md order, monitor WBC. Pt with low grade fever of 99.9  Problem: Pain Managment: Goal: General experience of comfort will improve Outcome: Progressing Pt denies pain, pt with anxiety d/t ETT.  Problem: Physical Regulation: Goal: Ability to maintain clinical measurements within normal limits will improve Outcome: Progressing Pt able to move all ext, lower ext weekness.  Problem: Skin Integrity: Goal: Risk for impaired skin integrity will decrease Outcome: Progressing Pt turned q2hours, foam in place.  Problem: Tissue Perfusion: Goal: Risk factors for ineffective tissue perfusion will decrease Outcome: Progressing Titrate levo for MAPs >65  Problem: Fluid Volume: Goal: Ability to maintain a balanced intake and output will improve Outcome: Not Progressing Pt with decreased UOP, [facility] MD aware. CVP monitoring started, IVF cont at 53ml/hour. Cont to monitor uop.  Problem: Nutrition: Goal: Adequate nutrition will be maintained Outcome: Not Progressing Pt npo, pending extubation in AM.  Problem: Bowel/Gastric: Goal: Will not experience complications related to bowel motility Outcome: Not Progressing ABD distended, hypoactive bowels. OG tube to ILWS.

## 2016-04-22 NOTE — Progress Notes (Signed)
Brief Nutrition Noted:   Pt s/p extubation this AM, NPO. Plan for bedside swallow and diet advancement as tolerated. Will continue to assess  Kerman Passey MS, [place], LDN [phone] Pager  [phone] Weekend/On-Call Pager

## 2016-04-22 NOTE — Progress Notes (Signed)
Patient in sustained afib rate 110-150s. Spoke w/ Dr. Fletcher Anon - advised to increase Cardizem gtt to 15mg /hr, and if this does not help lower/control rate call him back. Will change gtt rate to 15mg /hr and continue to monitor the patient. Bobby Peterson

## 2016-04-22 NOTE — Progress Notes (Signed)
PULMONARY / CRITICAL CARE MEDICINE   Name: Bobby Peterson MRN: [id number] DOB: 1941/03/23    ADMISSION DATE:  04/21/2016  BRIEF HISTORY:  Bobby Peterson is a 75 yo male with h/o COPD, CHF, CA and ,Hypertension.  Patient presents to the ED for evaluation of vomiting and shortness of breath noted by his wife earlier this morning.  Wife reports that he woke up vomiting and was short of breath.  Patient arrived in severe respiratory distress to [facility] and was pulled from the vehicle by staff.  As per the Dr, Edd Fabian, patient was tried on BiPAP and he started vomiting therefore patient was intubated for airway protection.  PCCM team was called to admit the patient.  SUBJECTIVE:  No acute events overnight, WUA-still lethargic, but will follow simple commands. Had some runs of atrial tach lastnight, still on dilt gtt@5    SIGNIFICANT EVENTS: 9/21> vomiting, resp failure, L sided PNA, hypoxic>ETT 9/22>  VITAL SIGNS: Temp:  [99.4 F (37.4 C)-100.5 F (38.1 C)] 99.4 F (37.4 C) (09/22 0800) Pulse Rate:  [40-157] 94 (09/22 0800) Resp:  [12-29] 15 (09/22 0800) BP: (70-156)/(35-74) 111/62 (09/22 0800) SpO2:  [91 %-97 %] 95 % (09/22 0800) FiO2 (%):  [40 %] 40 % (09/22 0753) Weight:  [192 lb 14.4 oz (87.5 kg)] 192 lb 14.4 oz (87.5 kg) (09/22 0406) HEMODYNAMICS: CVP:  [10 mmHg-20 mmHg] 20 mmHg VENTILATOR SETTINGS: Vent Mode: Spontaneous FiO2 (%):  [40 %] 40 % Set Rate:  [28 bmp] 28 bmp Vt Set:  [500 mL] 500 mL PEEP:  [5 cmH20] 5 cmH20 Pressure Support:  [5 cmH20] 5 cmH20 INTAKE / OUTPUT:  Intake/Output Summary (Last 24 hours) at 04/22/16 0853 Last data filed at 04/22/16 0800  Gross per 24 hour  Intake          4018.67 ml  Output             1052 ml  Net          2966.67 ml    Review of Systems  Unable to perform ROS: Intubated    Physical Exam  Constitutional: He is well-developed, well-nourished, and in no distress.  HENT:  Head: Normocephalic and atraumatic.  Right Ear:  External ear normal.  Left Ear: External ear normal.  Nose: Nose normal.  Neck: Neck supple.  Cardiovascular: Regular rhythm.   No murmur heard. tachycardia  Pulmonary/Chest: No respiratory distress. He has no wheezes. He has no rales.  Coarse upper airway sounds, left side coarse BS  Abdominal: Soft. Bowel sounds are normal.  Musculoskeletal: He exhibits no edema.  Nursing note and vitals reviewed.    LABS:  CBC  Recent Labs Lab 04/21/16 0525 04/22/16 0407  WBC 18.8* 37.5*  HGB 16.1 13.3  HCT 50.0 40.0  PLT 324 267   Coag's  Recent Labs Lab 04/21/16 0525  APTT 25  INR 1.02   BMET  Recent Labs Lab 04/21/16 0525 04/22/16 0407  NA 142 138  K 4.0 4.4  CL 104 108  CO2 29 22  BUN 15 24*  CREATININE 1.87* 2.28*  GLUCOSE 178* 264*   Electrolytes  Recent Labs Lab 04/21/16 0525 04/22/16 0407  CALCIUM 8.7* 7.4*  MG  --  1.5*  PHOS  --  3.1   Sepsis Markers  Recent Labs Lab 04/21/16 0525 04/21/16 0911 04/22/16 0407  LATICACIDVEN 3.1* 2.3*  --   PROCALCITON 0.18  --  86.43   ABG  Recent Labs Lab 04/21/16 0600 04/21/16 1130 04/22/16  0419  PHART 7.28* 7.31* 7.28*  PCO2ART 52* 41 46  PO2ART 236* 83 69*   Liver Enzymes  Recent Labs Lab 04/21/16 0525  AST 23  ALT 16*  ALKPHOS 102  BILITOT 0.2*  ALBUMIN 3.9   Cardiac Enzymes  Recent Labs Lab 04/21/16 1814 04/21/16 2305 04/22/16 0407  TROPONINI 0.03* 0.06* 0.03*   Glucose  Recent Labs Lab 04/21/16 1116 04/21/16 1643 04/21/16 1958 04/21/16 2337 04/22/16 0347 04/22/16 0749  GLUCAP 222* 340* 354* 306* 232* 184*    Imaging No results found.  STUDIES:  9/21 ECHO>   LINES/TUBES: 9/21 right IJ TLC>>   CULTURES: 04/21/16 Blood cultures>> 9//21/17 sputum cultures>>  ANTIBIOTICS: 9/21 vancomycin>> 9/21 Zosyn>>  DISCUSSION: Patient admitted  With copd and left sided PNA.  In ARF, mechanically ventilated  ASSESSMENT / PLAN:  PULMONARY A: Acute on chronic  respiratory failure COPD exacerbation Left sided PNA  P:   Full vent support, wean as tolerated Fentanyl, versed Routine ABG CXR prn Vanc/ Zosyn More awake and alert today, pending WUA, possible extubation today. Will need a follow up CT Chest in 6-8 wks as an outpatient.  Cont with nebs and steroid taper  CARDIOVASCULAR A:  Septic Shock>resolving Hx of Hypertension Hx of CHF Atrial Tachycardia P:  Continuous telemetry Levo gtt, wean as tolerated Keep MAP goals >65 Appreciate Cards recs - atrail tach with possible PACs related to PNA, cont with dilt gtt @5  for now and wean as tolerated.  ECHO 9/21  RENAL A:   Chronic kidney disease stage- 3 P:   Strict I/o  BMET in am Replace electrolytes per ICU protocol  GASTROINTESTINAL A:   No active issues P:   Npo Famotidine for GIP  HEMATOLOGIC A:   No active issues P:  lovenox for DVT prophylaxis Transfuse if Hgb<7  INFECTIOUS A:   Septic shock related to PNA-improving Left side PNA  P:   Monitor fever curve Follow cultures Vanc/ zosyn  ENDOCRINE A:   Diabetese melitus P:   Blood sugar cecks SSI  NEUROLOGIC A:   No active issues P:   RASS goal: 0 Minimize sedation    Thank you for consulting [facility], Please feel free to contacts [place] with any questions at [phone] (please enter 7-digits).  I have personally obtained a history, examined the patient, evaluated laboratory and imaging results, formulated the assessment and plan and placed orders.  The Patient requires high complexity decision making for assessment and support, frequent evaluation and titration of therapies, application of advanced monitoring technologies and extensive interpretation of multiple databases. Critical Care Time devoted to patient care services described in this note is 35 minutes.   Overall, patient is critically ill, prognosis is guarded. Patient at high risk for cardiac arrest and  death.    Vilinda Boehringer, MD [facility] Pager [phone] (please enter 7-digits) On Call Pager [phone] (please enter 7-digits)  Note: This note was prepared with Dragon dictation along with smaller phrase technology. Any transcriptional errors that result from this process are unintentional.

## 2016-04-22 NOTE — Progress Notes (Signed)
Update: Patient with strong cough, good tidal volume, neurologically appropriate, successfully extubated.   Vilinda Boehringer, MD [facility] Pager [phone] (please enter 7-digits) On Call Pager - [phone] (please enter 7-digits)

## 2016-04-23 DIAGNOSIS — F05 Delirium due to known physiological condition: Secondary | ICD-10-CM

## 2016-04-23 LAB — CULTURE, RESPIRATORY W GRAM STAIN

## 2016-04-23 LAB — GLUCOSE, CAPILLARY
GLUCOSE-CAPILLARY: 129 mg/dL — AB (ref 65–99)
GLUCOSE-CAPILLARY: 133 mg/dL — AB (ref 65–99)
GLUCOSE-CAPILLARY: 160 mg/dL — AB (ref 65–99)
GLUCOSE-CAPILLARY: 162 mg/dL — AB (ref 65–99)
GLUCOSE-CAPILLARY: 169 mg/dL — AB (ref 65–99)
Glucose-Capillary: 126 mg/dL — ABNORMAL HIGH (ref 65–99)

## 2016-04-23 LAB — BASIC METABOLIC PANEL
ANION GAP: 5 (ref 5–15)
BUN: 26 mg/dL — ABNORMAL HIGH (ref 6–20)
CALCIUM: 8.1 mg/dL — AB (ref 8.9–10.3)
CHLORIDE: 110 mmol/L (ref 101–111)
CO2: 23 mmol/L (ref 22–32)
Creatinine, Ser: 1.83 mg/dL — ABNORMAL HIGH (ref 0.61–1.24)
GFR calc Af Amer: 39 mL/min — ABNORMAL LOW (ref >60)
GFR calc non Af Amer: 34 mL/min — ABNORMAL LOW (ref >60)
GLUCOSE: 175 mg/dL — AB (ref 65–99)
POTASSIUM: 3.8 mmol/L (ref 3.5–5.1)
Sodium: 138 mmol/L (ref 135–145)

## 2016-04-23 LAB — TROPONIN I
TROPONIN I: 0.03 ng/mL — AB (ref <0.03)
TROPONIN I: 0.03 ng/mL — AB (ref <0.03)
TROPONIN I: 0.07 ng/mL — AB (ref <0.03)

## 2016-04-23 LAB — CULTURE, RESPIRATORY

## 2016-04-23 LAB — MAGNESIUM: Magnesium: 2 mg/dL (ref 1.7–2.4)

## 2016-04-23 LAB — PROCALCITONIN: Procalcitonin: 50.68 ng/mL

## 2016-04-23 MED ORDER — HALOPERIDOL LACTATE 5 MG/ML IJ SOLN
2.0000 mg | Freq: Once | INTRAMUSCULAR | Status: AC
  Administered 2016-04-23: 2 mg via INTRAVENOUS

## 2016-04-23 MED ORDER — LEVOFLOXACIN IN D5W 500 MG/100ML IV SOLN
500.0000 mg | Freq: Once | INTRAVENOUS | Status: AC
  Administered 2016-04-23: 500 mg via INTRAVENOUS
  Filled 2016-04-23 (×3): qty 100

## 2016-04-23 MED ORDER — ENOXAPARIN SODIUM 40 MG/0.4ML ~~LOC~~ SOLN
40.0000 mg | SUBCUTANEOUS | Status: DC
  Administered 2016-04-23 – 2016-04-26 (×4): 40 mg via SUBCUTANEOUS
  Filled 2016-04-23 (×4): qty 0.4

## 2016-04-23 MED ORDER — METOPROLOL TARTRATE 25 MG PO TABS
25.0000 mg | ORAL_TABLET | Freq: Three times a day (TID) | ORAL | Status: DC
  Administered 2016-04-23 – 2016-04-25 (×6): 25 mg via ORAL
  Filled 2016-04-23 (×6): qty 1

## 2016-04-23 MED ORDER — LORAZEPAM 2 MG/ML IJ SOLN
1.0000 mg | Freq: Once | INTRAMUSCULAR | Status: AC
  Administered 2016-04-23: 1 mg via INTRAVENOUS
  Filled 2016-04-23: qty 1

## 2016-04-23 MED ORDER — HALOPERIDOL LACTATE 5 MG/ML IJ SOLN
INTRAMUSCULAR | Status: AC
  Administered 2016-04-23: 1 mg
  Filled 2016-04-23: qty 1

## 2016-04-23 MED ORDER — QUETIAPINE FUMARATE 25 MG PO TABS
25.0000 mg | ORAL_TABLET | Freq: Every day | ORAL | Status: DC
  Administered 2016-04-23 – 2016-04-26 (×4): 25 mg via ORAL
  Filled 2016-04-23 (×4): qty 1

## 2016-04-23 MED ORDER — HALOPERIDOL LACTATE 5 MG/ML IJ SOLN
1.0000 mg | INTRAMUSCULAR | Status: DC | PRN
  Administered 2016-04-25: 1 mg via INTRAVENOUS
  Filled 2016-04-23: qty 1

## 2016-04-23 MED ORDER — LEVOFLOXACIN IN D5W 500 MG/100ML IV SOLN
500.0000 mg | INTRAVENOUS | Status: DC
  Administered 2016-04-23: 500 mg via INTRAVENOUS

## 2016-04-23 MED ORDER — LORAZEPAM 2 MG/ML IJ SOLN: 1.0000 mg | INTRAMUSCULAR | Status: DC | PRN

## 2016-04-23 MED ORDER — DIAZEPAM 5 MG/ML IJ SOLN
2.5000 mg | INTRAMUSCULAR | Status: DC | PRN
  Administered 2016-04-23 – 2016-04-25 (×4): 2.5 mg via INTRAVENOUS
  Filled 2016-04-23 (×4): qty 2

## 2016-04-23 MED ORDER — DEXMEDETOMIDINE HCL IN NACL 400 MCG/100ML IV SOLN
0.1000 ug/kg/h | INTRAVENOUS | Status: DC
  Administered 2016-04-23 – 2016-04-24 (×3): 0.4 ug/kg/h via INTRAVENOUS
  Administered 2016-04-25 (×2): 0.6 ug/kg/h via INTRAVENOUS
  Filled 2016-04-23 (×6): qty 100

## 2016-04-23 MED ORDER — MIDAZOLAM HCL 2 MG/2ML IJ SOLN
2.0000 mg | Freq: Once | INTRAMUSCULAR | Status: AC
  Administered 2016-04-23: 2 mg via INTRAVENOUS

## 2016-04-23 MED ORDER — LEVOFLOXACIN IN D5W 250 MG/50ML IV SOLN
250.0000 mg | INTRAVENOUS | Status: DC
  Administered 2016-04-24 – 2016-04-26 (×3): 250 mg via INTRAVENOUS
  Filled 2016-04-23 (×6): qty 50

## 2016-04-23 MED ORDER — FUROSEMIDE 10 MG/ML IJ SOLN
20.0000 mg | Freq: Once | INTRAMUSCULAR | Status: AC
  Administered 2016-04-23: 20 mg via INTRAVENOUS
  Filled 2016-04-23: qty 2

## 2016-04-23 MED ORDER — HALOPERIDOL LACTATE 5 MG/ML IJ SOLN
1.0000 mg | Freq: Once | INTRAMUSCULAR | Status: AC
  Administered 2016-04-23: 1 mg via INTRAVENOUS

## 2016-04-23 MED ORDER — DIAZEPAM 2 MG PO TABS
2.0000 mg | ORAL_TABLET | Freq: Once | ORAL | Status: AC
  Administered 2016-04-23: 2 mg via ORAL
  Filled 2016-04-23: qty 1

## 2016-04-23 NOTE — Progress Notes (Signed)
PATIENT ID: Bobby Peterson is a 51M with COPD, renal cell carcinoma, hypertension, hyperlipidemia, diabetes, and COPD here with pneumonia and hypoxic respiratory failure.  INTERVAL HISTORY: Extubated yesterday.  Very anxious, confused and agitated this AM.   SUBJECTIVE:  Complains of pain at his foley catheter site   PHYSICAL EXAM Vitals:   04/23/16 0800 04/23/16 0900 04/23/16 1000 04/23/16 1100  BP: (!) 128/91 (!) 146/81 (!) 100/51 131/67  Pulse: 96 95 82 79  Resp: (!) 28 (!) 25 20 18   Temp:      TempSrc:      SpO2: 92% 93% 94% 94%  Weight:      Height:       General:  Confused, agitated.  Neck: No JVD Lungs:  Rhonchi and expiratory wheeze at L base.  Otherwise clear anteriorly  Heart:  Irregularly irregular. No m/r/g.  Normal S1/S2 Abdomen:  Soft, NT, ND.  +BS Extremities:  WWP.  No edema  LABS: Lab Results  Component Value Date   TROPONINI 0.03 ([place]) 04/23/2016   Results for orders placed or performed during the hospital encounter of 04/21/16 (from the past 24 hour(s))  Glucose, capillary     Status: Abnormal   Collection Time: 04/22/16  4:31 PM  Result Value Ref Range   Glucose-Capillary 135 (H) 65 - 99 mg/dL  Troponin I     Status: None   Collection Time: 04/22/16  5:20 PM  Result Value Ref Range   Troponin I <0.03 <0.03 ng/mL  Glucose, capillary     Status: Abnormal   Collection Time: 04/22/16  7:47 PM  Result Value Ref Range   Glucose-Capillary 119 (H) 65 - 99 mg/dL  Glucose, capillary     Status: Abnormal   Collection Time: 04/22/16 11:51 PM  Result Value Ref Range   Glucose-Capillary 174 (H) 65 - 99 mg/dL  Glucose, capillary     Status: Abnormal   Collection Time: 04/23/16  3:59 AM  Result Value Ref Range   Glucose-Capillary 169 (H) 65 - 99 mg/dL  Troponin I     Status: Abnormal   Collection Time: 04/23/16  4:41 AM  Result Value Ref Range   Troponin I 0.03 (HH) <0.03 ng/mL  Procalcitonin     Status: None   Collection Time: 04/23/16  4:41 AM  Result  Value Ref Range   Procalcitonin 50.68 ng/mL  Basic metabolic panel     Status: Abnormal   Collection Time: 04/23/16  4:41 AM  Result Value Ref Range   Sodium 138 135 - 145 mmol/L   Potassium 3.8 3.5 - 5.1 mmol/L   Chloride 110 101 - 111 mmol/L   CO2 23 22 - 32 mmol/L   Glucose, Bld 175 (H) 65 - 99 mg/dL   BUN 26 (H) 6 - 20 mg/dL   Creatinine, Ser 1.83 (H) 0.61 - 1.24 mg/dL   Calcium 8.1 (L) 8.9 - 10.3 mg/dL   GFR calc non Af Amer 34 (L) >60 mL/min   GFR calc Af Amer 39 (L) >60 mL/min   Anion gap 5 5 - 15  Magnesium     Status: None   Collection Time: 04/23/16  4:41 AM  Result Value Ref Range   Magnesium 2.0 1.7 - 2.4 mg/dL  Glucose, capillary     Status: Abnormal   Collection Time: 04/23/16  7:27 AM  Result Value Ref Range   Glucose-Capillary 126 (H) 65 - 99 mg/dL    Intake/Output Summary (Last 24 hours) at 04/23/16 1210 Last  data filed at 04/23/16 1100  Gross per 24 hour  Intake          3519.88 ml  Output             2650 ml  Net           869.88 ml    Telemetry:  Multifocal atrial tachycardia rate 100s-140s.  Occasional PVCs  ASSESSMENT AND PLAN:  Principal Problem:   Acute respiratory failure with hypoxia (HCC) Active Problems:   COPD (chronic obstructive pulmonary disease) (HCC)   Aspiration pneumonia (HCC)   Chronic diastolic heart failure (HCC)   Chronic kidney disease (CKD), stage III (moderate)   Septic shock (HCC)   Atrial tachycardia (HCC)   Sepsis (HCC)   Left lower lobe pneumonia   # Multifocal atrial tachycardia: This is secondary to his underlying lung disease and hypoxic respiratory failure.  He is also delirious and agitated, which isn't helping.  Continue diltiazem and no anticoagulation is indicated.  BNP was 41 on admission, LVEF 60-65% on echo and there is no evidence of heart failure on exam.  Continue diltiazem.   # Delirium: Bobby Peterson is currently very delirious and anxious.  This is likely multifactorial, from being in the ICU, acutely  ill, and on steroids.  Family is at bedside to reorient.  He is in mitten restraints.  Will give IV haldol and versed.  Consider atypical antipsychotic, though these are restricted to psychiatry ordering.  Have asked the nursing staff to contact the intensivist.  UTI seems unlikely as urine is clear and he is on antibiotics.     Time spent: 40 minutes-Greater than 50% of this time was spent in counseling, explanation of diagnosis, planning of further management, and coordination of care.    June Vacha C. Oval Linsey, MD, [facility] 04/23/2016 12:10 PM

## 2016-04-23 NOTE — Progress Notes (Signed)
Dr Oval Linsey here to see pt.  He is extremely agitated.   Haldol and versed given  per Dr Oval Linsey orders. HR and RR elevated. Pulls off O2 and will not let us put it on.  Combative and attempts OOB.  Safety sitter and family at bedside.

## 2016-04-23 NOTE — Progress Notes (Signed)
Valium 2.5 mg IV given for agitation. Dr. Posey Pronto in to see pt earlier and added prn valium.

## 2016-04-23 NOTE — Progress Notes (Signed)
While rounding, I met with the wife of Pt and her friend. Wife was concerned with Pt level of confusion and agitation. I spent some time providing the ministry of empathetic listening, presence, and prayer. CH is available for follow up asa needed.     04/23/16 1400  Clinical Encounter Type  Visited With Family;Patient not available  Visit Type Initial;Spiritual support  Referral From Family  Spiritual Encounters  Spiritual Needs Prayer;Emotional;Grief support  Stress Factors  Patient Stress Factors Health changes;Major life changes  Family Stress Factors Exhausted;Health changes

## 2016-04-23 NOTE — Progress Notes (Signed)
[place] at [facility]                                                                                                                                                                                            Patient Demographics   Bobby Peterson, is a 75 y.o. male, DOB - 06/05/1941, [id number]  Admit date - 04/21/2016   Admitting Physician Vilinda Boehringer, MD  Outpatient Primary MD for the patient is Lelon Huh, MD   LOS - 2  Subjective:Patient is care has been transferred from the intensivist to the medical service.  He was very agitated and had to receive Valium. Currently sedated his family at bedside.     Review of Systems:   CONSTITUTIONAL:Sedated  Vitals:   Vitals:   04/23/16 0800 04/23/16 0900 04/23/16 1000 04/23/16 1100  BP: (!) 128/91 (!) 146/81 (!) 100/51 131/67  Pulse: 96 95 82 79  Resp: (!) 28 (!) 25 20 18   Temp:      TempSrc:      SpO2: 92% 93% 94% 94%  Weight:      Height:        Wt Readings from Last 3 Encounters:  04/23/16 198 lb 10.2 oz (90.1 kg)  02/12/16 190 lb (86.2 kg)  01/08/16 191 lb (86.6 kg)     Intake/Output Summary (Last 24 hours) at 04/23/16 1309 Last data filed at 04/23/16 1100  Gross per 24 hour  Intake          3422.38 ml  Output             2650 ml  Net           772.38 ml    Physical Exam:   GENERAL:Sedated.  HEAD, EYES, EARS, NOSE AND THROAT: Atraumatic, normocephalic.  Pupils equal and reactive to light. Sclerae anicteric. No conjunctival injection. No oro-pharyngeal erythema.  NECK: Supple. There is no jugular venous distention. No bruits, no lymphadenopathy, no thyromegaly.  HEART: Regular rate and rhythm,. No murmurs, no rubs, no clicks.  LUNGS: Left lung diminished breath sounds without any rhonchi or rales  ABDOMEN: Soft, flat, nontender, nondistended. Has good bowel sounds. No hepatosplenomegaly appreciated.  EXTREMITIES: No evidence of any cyanosis, clubbing, or peripheral  edema.  +2 pedal and radial pulses bilaterally.  NEUROLOGIC: Sedated SKIN: Moist and warm with no rashes appreciated.  Psych: Sedated LN: No inguinal LN enlargement    Antibiotics   Anti-infectives    Start     Dose/Rate Route Frequency Ordered Stop   04/21/16 1900  vancomycin (VANCOCIN) IVPB 1000 mg/200 mL premix  1,000 mg 200 mL/hr over 60 Minutes Intravenous Every 24 hours 04/21/16 1652     04/21/16 1400  piperacillin-tazobactam (ZOSYN) IVPB 3.375 g     3.375 g 12.5 mL/hr over 240 Minutes Intravenous Every 8 hours 04/21/16 0848     04/21/16 1130  azithromycin (ZITHROMAX) 500 mg in dextrose 5 % 250 mL IVPB     500 mg 250 mL/hr over 60 Minutes Intravenous Every 24 hours 04/21/16 1021     04/21/16 0715  piperacillin-tazobactam (ZOSYN) IVPB 3.375 g  Status:  Discontinued     3.375 g 12.5 mL/hr over 240 Minutes Intravenous Every 6 hours 04/21/16 0709 04/21/16 0847   04/21/16 0600  vancomycin (VANCOCIN) IVPB 1000 mg/200 mL premix     1,000 mg 200 mL/hr over 60 Minutes Intravenous  Once 04/21/16 0554 04/21/16 0658   04/21/16 0600  piperacillin-tazobactam (ZOSYN) IVPB 3.375 g     3.375 g 100 mL/hr over 30 Minutes Intravenous  Once 04/21/16 0554 04/21/16 0651      Medications   Scheduled Meds: . azithromycin  500 mg Intravenous Q24H  . budesonide (PULMICORT) nebulizer solution  0.5 mg Nebulization BID  . enoxaparin (LOVENOX) injection  40 mg Subcutaneous Q24H  . famotidine (PEPCID) IV  20 mg Intravenous Q24H  . insulin aspart  0-15 Units Subcutaneous Q4H  . ipratropium-albuterol  3 mL Nebulization Q6H  . methylPREDNISolone (SOLU-MEDROL) injection  40 mg Intravenous Daily   Followed by  . [START ON 04/26/2016] methylPREDNISolone (SOLU-MEDROL) injection  30 mg Intravenous Daily   Followed by  . [START ON 04/29/2016] methylPREDNISolone (SOLU-MEDROL) injection  20 mg Intravenous Daily   Followed by  . [START ON 05/02/2016] methylPREDNISolone (SOLU-MEDROL) injection  10 mg  Intravenous Daily  . metoprolol tartrate  25 mg Oral Q8H  . nicotine  14 mg Transdermal Daily  . piperacillin-tazobactam (ZOSYN)  IV  3.375 g Intravenous Q8H  . QUEtiapine  25 mg Oral QHS  . sodium chloride flush  10-40 mL Intracatheter Q12H  . vancomycin  1,000 mg Intravenous Q24H   Continuous Infusions: . sodium chloride 75 mL/hr at 04/23/16 0900  . dexmedetomidine 0.6 mcg/kg/hr (04/23/16 1255)  . diltiazem (CARDIZEM) infusion 15 mg/hr (04/23/16 1244)  . fentaNYL infusion INTRAVENOUS Stopped (04/22/16 0741)  . norepinephrine (LEVOPHED) Adult infusion Stopped (04/22/16 1417)   PRN Meds:.sodium chloride, diazepam, fentaNYL, haloperidol lactate, ipratropium-albuterol, midazolam, sodium chloride flush   Data Review:   Micro Results Recent Results (from the past 240 hour(s))  Blood culture (routine x 2)     Status: None (Preliminary result)   Collection Time: 04/21/16  5:25 AM  Result Value Ref Range Status   Specimen Description BLOOD LEFT WRIST  Final   Special Requests   Final    BOTTLES DRAWN AEROBIC AND ANAEROBIC AER 9ML ANA 6ML   Culture NO GROWTH 2 DAYS  Final   Report Status PENDING  Incomplete  Blood culture (routine x 2)     Status: None (Preliminary result)   Collection Time: 04/21/16  5:25 AM  Result Value Ref Range Status   Specimen Description BLOOD RIGHT FORE ARM  Final   Special Requests   Final    BOTTLES DRAWN AEROBIC AND ANAEROBIC AER 4ML ANA 3ML   Culture NO GROWTH 2 DAYS  Final   Report Status PENDING  Incomplete  Urine culture     Status: None   Collection Time: 04/21/16  6:30 AM  Result Value Ref Range Status   Specimen Description URINE, RANDOM  Final   Special Requests NONE  Final   Culture NO GROWTH Performed at [facility]   Final   Report Status 04/22/2016 FINAL  Final  Rapid Influenza A&B Antigens ([place] only)     Status: None   Collection Time: 04/21/16  6:34 AM  Result Value Ref Range Status   Influenza A ([place]) NEGATIVE NEGATIVE  Final   Influenza B ([facility]) NEGATIVE NEGATIVE Final  MRSA PCR Screening     Status: Abnormal   Collection Time: 04/21/16  8:42 AM  Result Value Ref Range Status   MRSA by PCR POSITIVE (A) NEGATIVE Final    Comment:        The GeneXpert MRSA Assay (FDA approved for NASAL specimens only), is one component of a comprehensive MRSA colonization surveillance program. It is not intended to diagnose MRSA infection nor to guide or monitor treatment for MRSA infections. RESULT CALLED TO, READ BACK BY AND VERIFIED WITH: NIA DILE 04/21/16 1051 SGD   Culture, respiratory (NON-Expectorated)     Status: None   Collection Time: 04/21/16 12:01 PM  Result Value Ref Range Status   Specimen Description TRACHEAL ASPIRATE  Final   Special Requests NONE  Final   Gram Stain   Final    ABUNDANT WBC PRESENT,BOTH PMN AND MONONUCLEAR RARE GRAM POSITIVE COCCI IN PAIRS RARE GRAM NEGATIVE RODS RARE YEAST Performed at [place]  Final   Report Status 04/23/2016 FINAL  Final   Organism ID, Bacteria KLEBSIELLA PNEUMONIAE  Final      Susceptibility   Klebsiella pneumoniae - MIC*    AMPICILLIN >=32 RESISTANT Resistant     CEFAZOLIN <=4 SENSITIVE Sensitive     CEFEPIME <=1 SENSITIVE Sensitive     CEFTAZIDIME <=1 SENSITIVE Sensitive     CEFTRIAXONE <=1 SENSITIVE Sensitive     CIPROFLOXACIN <=0.25 SENSITIVE Sensitive     GENTAMICIN <=1 SENSITIVE Sensitive     IMIPENEM <=0.25 SENSITIVE Sensitive     TRIMETH/SULFA <=20 SENSITIVE Sensitive     AMPICILLIN/SULBACTAM 4 SENSITIVE Sensitive     PIP/TAZO <=4 SENSITIVE Sensitive     Extended ESBL NEGATIVE Sensitive     * FEW KLEBSIELLA PNEUMONIAE    Radiology Reports Dg Abdomen 1 View  Result Date: 04/21/2016 CLINICAL DATA:  Enteric tube placement. EXAM: ABDOMEN - 1 VIEW COMPARISON:  CT abdomen and pelvis 01/12/2015 FINDINGS: An enteric tube has been placed with tip in the left upper quadrant consistent with location  in the upper stomach. Multiple superimposed lines and wires. These are likely extrinsic. Surgical clips in the right upper quadrant. Visualized bowel gas pattern is unremarkable. Left pleural effusion with left pulmonary infiltrates. IMPRESSION: Enteric tube tip localizes to the left upper quadrant consistent with location in the upper stomach. Electronically Signed   By: Lucienne Capers M.D.   On: 04/21/2016 06:41   Dg Chest Portable 1 View  Result Date: 04/21/2016 CLINICAL DATA:  Central line placement EXAM: PORTABLE CHEST 1 VIEW COMPARISON:  04/21/2016 FINDINGS: Endotracheal tube remains in good position. Right jugular central venous catheter tip in the mid SVC without pneumothorax. NG tube enters the stomach Extensive infiltrate in the left upper lobe and left lower lobe is unchanged and compatible with pneumonia. Small left effusion. Right lung remains clear with minimal right lower lobe atelectasis IMPRESSION: Central venous catheter tip in the SVC without pneumothorax Endotracheal tube in good position Extensive pneumonia left upper lobe and left lower lobe is unchanged. Electronically  Signed   By: Franchot Gallo M.D.   On: 04/21/2016 08:00   Dg Chest Portable 1 View  Result Date: 04/21/2016 CLINICAL DATA:  Endotracheal tube placement. Patient's iodine ache with gasping respirations. EXAM: PORTABLE CHEST 1 VIEW COMPARISON:  01/27/2016 FINDINGS: Endotracheal tube placed with tip measuring 3.4 cm above the carina. Enteric tube tip is off the field of view but below the left hemidiaphragm. There is interval development since the previous study of diffuse patchy infiltration throughout the left lung most likely represent pneumonia. Small left pleural effusion. Right lung appears clear. IMPRESSION: Appliances appear in satisfactory position. Diffuse infiltration throughout the left lung with small effusion likely representing pneumonia. Electronically Signed   By: Lucienne Capers M.D.   On: 04/21/2016  06:39     CBC  Recent Labs Lab 04/21/16 0525 04/22/16 0407  WBC 18.8* 37.5*  HGB 16.1 13.3  HCT 50.0 40.0  PLT 324 267  MCV 85.9 85.0  MCH 27.7 28.2  MCHC 32.2 33.2  RDW 15.9* 16.1*  LYMPHSABS 2.5  --   MONOABS 0.1*  --   EOSABS 0.1  --   BASOSABS 0.1  --     Chemistries   Recent Labs Lab 04/21/16 0525 04/22/16 0407 04/23/16 0441  NA 142 138 138  K 4.0 4.4 3.8  CL 104 108 110  CO2 29 22 23   GLUCOSE 178* 264* 175*  BUN 15 24* 26*  CREATININE 1.87* 2.28* 1.83*  CALCIUM 8.7* 7.4* 8.1*  MG  --  1.5* 2.0  AST 23  --   --   ALT 16*  --   --   ALKPHOS 102  --   --   BILITOT 0.2*  --   --    ------------------------------------------------------------------------------------------------------------------ estimated creatinine clearance is 36.2 mL/min (by C-G formula based on SCr of 1.83 mg/dL (H)). ------------------------------------------------------------------------------------------------------------------ No results for input(s): HGBA1C in the last 72 hours. ------------------------------------------------------------------------------------------------------------------ No results for input(s): CHOL, HDL, LDLCALC, TRIG, CHOLHDL, LDLDIRECT in the last 72 hours. ------------------------------------------------------------------------------------------------------------------  Recent Labs  04/21/16 1150  TSH 1.556   ------------------------------------------------------------------------------------------------------------------ No results for input(s): VITAMINB12, FOLATE, FERRITIN, TIBC, IRON, RETICCTPCT in the last 72 hours.  Coagulation profile  Recent Labs Lab 04/21/16 0525  INR 1.02    No results for input(s): DDIMER in the last 72 hours.  Cardiac Enzymes  Recent Labs Lab 04/22/16 1108 04/22/16 1720 04/23/16 0441  TROPONINI <0.03 <0.03 0.03*    ------------------------------------------------------------------------------------------------------------------ Invalid input(s): POCBNP    Assessment & Plan  Patient 75 year old admitted with acute respiratory failure due to pneumonia 1.   Acute respiratory failure with hypoxia (HCC) Due to combination of acute on chronic COPD exasperation as well as pneumonia Patient's sputum cultures consistent with Klebsiella pneumonia I will change his antibiotics to IV Levaquin stopped the other antibiotics Continue oxygen therapy  2. Acute delirium Due to pneumonia as well as acute respiratory failure At this point Valium has helped I will continue that, if he continues to have issues with agitation we may have to use IV Precedex We'll try Craig Guess at nighttime   3. 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   Chronic diastolic heart failure (HCC) currently compensated continue to monitor fluid status  5.   Chronic kidney disease (CKD), stage III (moderate) avoid nephrotoxins monitor renal    6. Atrial fibrillation with rapid ventricular rate continue Cardizem  7. Diabetes type 2 continue sliding scale insulin  Code Status Orders        Start     Ordered   04/21/16 0702  Full code  Continuous     04/21/16 0702    Code Status History    Date Active Date Inactive Code Status Order ID Comments User Context   04/21/2016  7:02 AM 04/21/2016  5:03 PM Full Code [id number]  Holley Raring, NP ED   01/04/2015  2:01 PM 01/07/2015  5:37 PM Full Code [id number]  Greggory Keen, MD Inpatient   01/03/2015  3:03 PM 01/04/2015  2:01 PM Full Code [id number]  Seeplaputhur Robinette Haines, MD Inpatient           Consults  Pulmonary   DVT Prophylaxis  Lovenox  Lab Results  Component Value Date   PLT 267 04/22/2016     Time Spent in minutes   35 minutes of critical care time spent high risk of  cardiopulmonary arrest  Dustin Flock M.D on 04/23/2016 at 1:09 PM  Between 7am to 6pm - Pager - [phone]  After 6pm go to [email or web address] - password [email or web address] [facility]   Office  [phone]

## 2016-04-23 NOTE — Progress Notes (Signed)
~  1930-2000 Patient having increased work of breathing, tachypnea (28-30 Rr) patient complains of SOB. Raised HOB, patient on 4L Nasal cannula. Emotional support given, respiratory assessment preformed. Charge nurse assessed patient as well. Gave PRN Valium Iv, and RT administered breathing treatments.  ~2030 Patient voices improved breathing, vital signs stable. Family at Bedside still concerned about breathing. Nurse Practitioner on for the night came to bedside and spoke with family.  NP added IV Lasix one time dose.  ~2210 Lasix given. (see MAR)  ~2344 patient resting in bed, will continue to reassess

## 2016-04-23 NOTE — Progress Notes (Signed)
Transferred to CCU room #3.  Family here and aware. Awoke to eat a few bites of food. Remains confused, impulsive and tries to get OOB

## 2016-04-23 NOTE — Progress Notes (Signed)
PULMONARY / CRITICAL CARE MEDICINE   Name: Bobby Peterson MRN: [id number] DOB: 11/17/1940    ADMISSION DATE:  04/21/2016  BRIEF HISTORY:  Bobby Peterson is a 75 yo male with h/o COPD, CHF, CA and ,Hypertension.  Patient presents to the ED for evaluation of vomiting and shortness of breath noted by his wife earlier this morning.  Wife reports that he woke up vomiting and was short of breath.  Patient arrived in severe respiratory distress to [facility] and was pulled from the vehicle by staff.  As per the Dr, Edd Fabian, patient was tried on BiPAP and he started vomiting therefore patient was intubated for airway protection.  PCCM team was called to admit the patient.  SUBJECTIVE:  Severe agitation during the day necessitating a precedex gtt. Agitation improved and patient slept all night. He appeared to liver drowsy at the beginning of shift, but improved.    SIGNIFICANT EVENTS: 9/21> vomiting, resp failure, L sided PNA, hypoxic>ETT 9/22>extubated, mild confusion 9/23>delirious, started on precedex gtt  VITAL SIGNS: Temp:  [97.7 F (36.5 C)-99.3 F (37.4 C)] 97.7 F (36.5 C) (09/23 2000) Pulse Rate:  [33-134] 75 (09/23 1600) Resp:  [15-30] 18 (09/23 1600) BP: (97-147)/(51-98) 97/53 (09/23 1800) SpO2:  [91 %-99 %] 99 % (09/23 2007) FiO2 (%):  [92 %] 92 % (09/23 0811) Weight:  [198 lb 10.2 oz (90.1 kg)] 198 lb 10.2 oz (90.1 kg) (09/23 0453) HEMODYNAMICS:   VENTILATOR SETTINGS: FiO2 (%):  [92 %] 92 % INTAKE / OUTPUT:  Intake/Output Summary (Last 24 hours) at 04/23/16 2021 Last data filed at 04/23/16 2000  Gross per 24 hour  Intake          3860.68 ml  Output             2600 ml  Net          1260.68 ml    Review of Systems  Unable to perform ROS: Intubated    Physical Exam  Constitutional: He is well-developed, well-nourished, and in no distress.  HENT:  Head: Normocephalic and atraumatic.  Right Ear: External ear normal.  Left Ear: External ear normal.  Nose: Nose normal.   Neck: Neck supple.  Cardiovascular: Regular rhythm.   No murmur heard. tachycardia  Pulmonary/Chest: No respiratory distress. He has no wheezes. He has no rales.  Coarse upper airway sounds, left side coarse BS  Abdominal: Soft. Bowel sounds are normal.  Musculoskeletal: He exhibits no edema.  Nursing note and vitals reviewed.    LABS:  CBC  Recent Labs Lab 04/21/16 0525 04/22/16 0407  WBC 18.8* 37.5*  HGB 16.1 13.3  HCT 50.0 40.0  PLT 324 267   Coag's  Recent Labs Lab 04/21/16 0525  APTT 25  INR 1.02   BMET  Recent Labs Lab 04/21/16 0525 04/22/16 0407 04/23/16 0441  NA 142 138 138  K 4.0 4.4 3.8  CL 104 108 110  CO2 29 22 23   BUN 15 24* 26*  CREATININE 1.87* 2.28* 1.83*  GLUCOSE 178* 264* 175*   Electrolytes  Recent Labs Lab 04/21/16 0525 04/22/16 0407 04/23/16 0441  CALCIUM 8.7* 7.4* 8.1*  MG  --  1.5* 2.0  PHOS  --  3.1  --    Sepsis Markers  Recent Labs Lab 04/21/16 0525 04/21/16 0911 04/22/16 0407 04/23/16 0441  LATICACIDVEN 3.1* 2.3*  --   --   PROCALCITON 0.18  --  86.43 50.68   ABG  Recent Labs Lab 04/21/16 0600 04/21/16 1130  04/22/16 0419  PHART 7.28* 7.31* 7.28*  PCO2ART 52* 41 46  PO2ART 236* 83 69*   Liver Enzymes  Recent Labs Lab 04/21/16 0525  AST 23  ALT 16*  ALKPHOS 102  BILITOT 0.2*  ALBUMIN 3.9   Cardiac Enzymes  Recent Labs Lab 04/22/16 1720 04/23/16 0441 04/23/16 1400  TROPONINI <0.03 0.03* 0.07*   Glucose  Recent Labs Lab 04/22/16 2351 04/23/16 0359 04/23/16 0727 04/23/16 1153 04/23/16 1610 04/23/16 1941  GLUCAP 174* 169* 126* 129* 160* 162*    Imaging No results found.  STUDIES:  9/21 ECHO>Left ventricular ejection fraction is 60-65%, normal pulmonary artery pressures, and normal heart chambers.  LINES/TUBES: 9/21 right IJ TLC>>   CULTURES: 04/21/16 Blood cultures>> no growth to date 9//21/17 sputum cultures>> positive for Klebsiella pneumonia  ANTIBIOTICS: 9/21  vancomycin>> 04/23/2016 9/21 Zosyn>04/23/2016 04/23/2016> Levaquin  DISCUSSION: Patient admitted  With copd and left sided PNA.  In ARF, mechanically ventilated; now extubated and doing well on 2 L nasal cannula of O2  ASSESSMENT / PLAN:  PULMONARY A: Acute on chronic respiratory failure COPD exacerbation Left sided PNA  P:   Supplemental oxygen via nasal cannula to keep oxygen saturation greater than 90% Will need a follow up CT Chest in 6-8 wks as an outpatient.  Cont with nebs and steroid taper  CARDIOVASCULAR A:  Septic Shock>resolving Hx of Hypertension Hx of CHF Atrial Tachycardia P:  Continuous telemetry Levo gtt, wean as tolerated Keep MAP goals >65 Appreciate Cards recs - atrail tach with possible PACs related to PNA, cont with dilt gtt @5  for now and wean as tolerated.  ECHO 9/21-normal. EF  RENAL A:   Chronic kidney disease stage- 3 Hypokalemia P:   Strict I/o  BMET in am Replace electrolytes per ICU protocol  GASTROINTESTINAL A:   No active issues P:   Npo Famotidine for GIP  HEMATOLOGIC A:   No active issues P:  lovenox for DVT prophylaxis Transfuse if Hgb<7  INFECTIOUS A:   Septic shock related to PNA-improving Left side PNA  P:   Monitor fever curve Follow cultures Vanc/ zosyn discontinued and Levaquin started  ENDOCRINE A:   Diabetese melitus P:   Blood sugar cecks SSI  NEUROLOGIC A:   No active issues P:   RASS goal: 0 Minimize sedation   Disposition and Family update: Magdalene S. Tukov ANP-BC Pulmonary and [place] Pager [phone] or [phone]  STAFF NOTE: I, Dr. Vilinda Boehringer have personally reviewed patient's available data, including medical history, events of note, physical examination and test results as part of my evaluation. I have discussed with NP  Patria Mane and other care providers such as pharmacist, RN and RRT.    Noted to have some agitation yesterday,  which progressed, currently on precedex gtt, low dose.   A:Patient admitted  With copd and left sided PNA.  In ARF, mechanically ventilated; now extubated and doing well on 2 L nasal cannula of O2  Acute respiratory failure-resolved COPD exacerbation L sided PNA Klebsiella PNA Acute delirium Metabolic Encephalopathy CHF -diastolic Afib CKD III  P:  - abx adjust to levaquin - wean precedex as tolerated - will check ABG for acute delirium, IV steroids could also cause delirium, if abg is unimpressive, may need to consider lowering IV steroid dose.  - cont with AECOPD treatment.  -cont with dilt  -avoid nephrotoxic drugs.    .  Rest per NP/medical resident whose note is outlined above and that I agree with  Pulmonary Care Time devoted to patient care services described in this note is  35 Minutes.   This time reflects time of care of this signee Dr Vilinda Boehringer.  This critical care time does not reflect procedure time, or teaching time or supervisory time of PA/NP/Med-student/Med Resident etc but could involve care discussion time.  Vilinda Boehringer, MD [place] [facility] Pager [phone] (please enter 7-digits) On Call Pager [phone] (please enter 7-digits)  Note: This note was prepared with Dragon dictation along with smaller phrase technology. Any transcriptional errors that result from this process are unintentional.

## 2016-04-23 NOTE — Progress Notes (Signed)
Versed 2mg  IV for continuing agitation. Dr Stevenson Clinch at bedside.  Safety sitter in room.  Dr Stevenson Clinch spoke with pt wife who is also at bedside.

## 2016-04-23 NOTE — Progress Notes (Signed)
Dr Stevenson Clinch made aware of increased agitation and haldol and versed given as ordered by Dr Oval Linsey.  Precedex gtt order received. Precedex started at 0.4

## 2016-04-23 NOTE — Progress Notes (Signed)
MEDICATION RELATED PHARMACY CONSULT NOTE- Antibiotic/Electrolytes/Constipation Consults   75 yo male ICU patient s/p mechanical ventilation 9/23  Plan:   1. Antibiotics: Patient on Zosyn and Azithromycin 500mg  IV x 5 doses. Will continue vancomycin 1000mg  IV Q24hr for goal trough of 15-20. Trough with 4th dose.  SCr increased 9/22 then decreased 9/23.  TRach aspirate= + Klebsiella Pneumonia (resis to ampicillin), few GPC, few yeast.     2. Electrolytes: K= 3.8, Mag=2.0.  f/u AM labs.   3. Constipation prevention: to start clear liquids 9/23. F/u.    Allergies  Allergen Reactions  . Fenofibrate     Other reaction(s): Headache  . Lisinopril Cough  . Niacin     Other reaction(s): Dizziness  . Phenergan [Promethazine Hcl]     NVD  . Simvastatin     Other reaction(s): Headache    Patient Measurements: Height: 5\' 7"  (170.2 cm) Weight: 198 lb 10.2 oz (90.1 kg) IBW/kg (Calculated) : 66.1  Vital Signs: Temp: 98 F (36.7 C) (09/23 0737) Temp Source: Oral (09/23 0737) BP: 146/81 (09/23 0900) Pulse Rate: 95 (09/23 0900) Intake/Output from previous day: 09/22 0701 - 09/23 0700 In: 3778.6 [I.V.:3228.6; IV Piggyback:550] Out: [id number] [Urine:2065; Emesis/NG output:375] Intake/Output from this shift: Total I/O In: 350 [P.O.:120; I.V.:180; IV Piggyback:50] Out: -   Labs:  Recent Labs  04/21/16 0525 04/22/16 0407 04/23/16 0441  WBC 18.8* 37.5*  --   HGB 16.1 13.3  --   HCT 50.0 40.0  --   PLT 324 267  --   APTT 25  --   --   CREATININE 1.87* 2.28* 1.83*  MG  --  1.5* 2.0  PHOS  --  3.1  --   ALBUMIN 3.9  --   --   PROT 7.9  --   --   AST 23  --   --   ALT 16*  --   --   ALKPHOS 102  --   --   BILITOT 0.2*  --   --    Estimated Creatinine Clearance: 36.2 mL/min (by C-G formula based on SCr of 1.83 mg/dL (H)).   Microbiology: Recent Results (from the past 720 hour(s))  Blood culture (routine x 2)     Status: None (Preliminary result)   Collection Time: 04/21/16   5:25 AM  Result Value Ref Range Status   Specimen Description BLOOD LEFT WRIST  Final   Special Requests   Final    BOTTLES DRAWN AEROBIC AND ANAEROBIC AER 9ML ANA 6ML   Culture NO GROWTH 2 DAYS  Final   Report Status PENDING  Incomplete  Blood culture (routine x 2)     Status: None (Preliminary result)   Collection Time: 04/21/16  5:25 AM  Result Value Ref Range Status   Specimen Description BLOOD RIGHT FORE ARM  Final   Special Requests   Final    BOTTLES DRAWN AEROBIC AND ANAEROBIC AER 4ML ANA 3ML   Culture NO GROWTH 2 DAYS  Final   Report Status PENDING  Incomplete  Urine culture     Status: None   Collection Time: 04/21/16  6:30 AM  Result Value Ref Range Status   Specimen Description URINE, RANDOM  Final   Special Requests NONE  Final   Culture NO GROWTH Performed at [facility]   Final   Report Status 04/22/2016 FINAL  Final  Rapid Influenza A&B Antigens ([place] only)     Status: None   Collection Time: 04/21/16  6:34 AM  Result Value Ref Range Status   Influenza A ([facility]) NEGATIVE NEGATIVE Final   Influenza B ([facility]) NEGATIVE NEGATIVE Final  MRSA PCR Screening     Status: Abnormal   Collection Time: 04/21/16  8:42 AM  Result Value Ref Range Status   MRSA by PCR POSITIVE (A) NEGATIVE Final    Comment:        The GeneXpert MRSA Assay (FDA approved for NASAL specimens only), is one component of a comprehensive MRSA colonization surveillance program. It is not intended to diagnose MRSA infection nor to guide or monitor treatment for MRSA infections. RESULT CALLED TO, READ BACK BY AND VERIFIED WITH: NIA DILE 04/21/16 1051 SGD   Culture, respiratory (NON-Expectorated)     Status: None   Collection Time: 04/21/16 12:01 PM  Result Value Ref Range Status   Specimen Description TRACHEAL ASPIRATE  Final   Special Requests NONE  Final   Gram Stain   Final    ABUNDANT WBC PRESENT,BOTH PMN AND MONONUCLEAR RARE GRAM POSITIVE COCCI IN PAIRS RARE GRAM NEGATIVE  RODS RARE YEAST Performed at [place]  Final   Report Status 04/23/2016 FINAL  Final   Organism ID, Bacteria KLEBSIELLA PNEUMONIAE  Final      Susceptibility   Klebsiella pneumoniae - MIC*    AMPICILLIN >=32 RESISTANT Resistant     CEFAZOLIN <=4 SENSITIVE Sensitive     CEFEPIME <=1 SENSITIVE Sensitive     CEFTAZIDIME <=1 SENSITIVE Sensitive     CEFTRIAXONE <=1 SENSITIVE Sensitive     CIPROFLOXACIN <=0.25 SENSITIVE Sensitive     GENTAMICIN <=1 SENSITIVE Sensitive     IMIPENEM <=0.25 SENSITIVE Sensitive     TRIMETH/SULFA <=20 SENSITIVE Sensitive     AMPICILLIN/SULBACTAM 4 SENSITIVE Sensitive     PIP/TAZO <=4 SENSITIVE Sensitive     Extended ESBL NEGATIVE Sensitive     * [place] will continue to monitor and adjust per consult.   Duane Earnshaw A 04/23/2016,11:06 AM

## 2016-04-23 NOTE — Progress Notes (Addendum)
Update Patient with moderate to severe agitation this afternoon, noted to be sundowning early today, got valium, 2mg  versed with little improvement.  Started patient on precedex gtt with good improvement in agitation.   Vilinda Boehringer, MD [facility] Pager [phone] (please enter 7-digits) On Call Pager - [phone] (please enter 7-digits)

## 2016-04-23 NOTE — Progress Notes (Signed)
Ativan 1mg  Iv for agitation per DR Mungal suggestion.

## 2016-04-23 NOTE — Progress Notes (Signed)
Pharmacy Note  MD orders for levofloxacin 500 mg IV q24h. Will renally adjust to 500 IV mg x1 then 250 mg IV q24h thereafter per policy. Also noted active order for azithromycin. Will d/c this order as MD note states change "abx to IV levaquin stopped the other antibiotics" and levaquin provides duplicate antimicrobial  coverage.

## 2016-04-24 ENCOUNTER — Inpatient Hospital Stay: Payer: Commercial Managed Care - HMO

## 2016-04-24 LAB — CBC
HCT: 36.5 % — ABNORMAL LOW (ref 40.0–52.0)
HEMOGLOBIN: 12.4 g/dL — AB (ref 13.0–18.0)
MCH: 28.1 pg (ref 26.0–34.0)
MCHC: 34.1 g/dL (ref 32.0–36.0)
MCV: 82.6 fL (ref 80.0–100.0)
Platelets: 175 10*3/uL (ref 150–440)
RBC: 4.42 MIL/uL (ref 4.40–5.90)
RDW: 16.2 % — ABNORMAL HIGH (ref 11.5–14.5)
WBC: 15.4 10*3/uL — ABNORMAL HIGH (ref 3.8–10.6)

## 2016-04-24 LAB — GLUCOSE, CAPILLARY
GLUCOSE-CAPILLARY: 127 mg/dL — AB (ref 65–99)
GLUCOSE-CAPILLARY: 138 mg/dL — AB (ref 65–99)
Glucose-Capillary: 138 mg/dL — ABNORMAL HIGH (ref 65–99)
Glucose-Capillary: 126 mg/dL — ABNORMAL HIGH (ref 65–99)
Glucose-Capillary: 127 mg/dL — ABNORMAL HIGH (ref 65–99)
Glucose-Capillary: 110 mg/dL — ABNORMAL HIGH (ref 65–99)

## 2016-04-24 LAB — BASIC METABOLIC PANEL
ANION GAP: 6 (ref 5–15)
BUN: 31 mg/dL — ABNORMAL HIGH (ref 6–20)
CHLORIDE: 111 mmol/L (ref 101–111)
CO2: 24 mmol/L (ref 22–32)
Calcium: 8.2 mg/dL — ABNORMAL LOW (ref 8.9–10.3)
Creatinine, Ser: 1.54 mg/dL — ABNORMAL HIGH (ref 0.61–1.24)
GFR calc non Af Amer: 42 mL/min — ABNORMAL LOW (ref >60)
GFR, EST AFRICAN AMERICAN: 48 mL/min — AB (ref >60)
Glucose, Bld: 143 mg/dL — ABNORMAL HIGH (ref 65–99)
POTASSIUM: 3.4 mmol/L — AB (ref 3.5–5.1)
SODIUM: 141 mmol/L (ref 135–145)

## 2016-04-24 LAB — BLOOD GAS, ARTERIAL
ACID-BASE DEFICIT: 1.3 mmol/L (ref 0.0–2.0)
BICARBONATE: 22.7 mmol/L (ref 20.0–28.0)
FIO2: 0.36
O2 Saturation: 93.1 %
PCO2 ART: 35 mmHg (ref 32.0–48.0)
PH ART: 7.42 (ref 7.350–7.450)
Patient temperature: 37
pO2, Arterial: 66 mmHg — ABNORMAL LOW (ref 83.0–108.0)

## 2016-04-24 LAB — PHOSPHORUS: PHOSPHORUS: 1.7 mg/dL — AB (ref 2.5–4.6)

## 2016-04-24 LAB — MAGNESIUM: MAGNESIUM: 2.1 mg/dL (ref 1.7–2.4)

## 2016-04-24 MED ORDER — POTASSIUM & SODIUM PHOSPHATES 280-160-250 MG PO PACK
1.0000 | PACK | ORAL | Status: AC
  Administered 2016-04-24 – 2016-04-25 (×4): 1 via ORAL
  Filled 2016-04-24 (×6): qty 1

## 2016-04-24 MED ORDER — POTASSIUM CHLORIDE 20 MEQ PO PACK
60.0000 meq | PACK | Freq: Once | ORAL | Status: AC
  Administered 2016-04-24: 60 meq via ORAL
  Filled 2016-04-24: qty 3

## 2016-04-24 MED ORDER — FUROSEMIDE 10 MG/ML IJ SOLN
20.0000 mg | Freq: Once | INTRAMUSCULAR | Status: AC
  Administered 2016-04-24: 20 mg via INTRAVENOUS
  Filled 2016-04-24: qty 2

## 2016-04-24 MED ORDER — ORAL CARE MOUTH RINSE
15.0000 mL | Freq: Two times a day (BID) | OROMUCOSAL | Status: DC
  Administered 2016-04-24 – 2016-04-26 (×5): 15 mL via OROMUCOSAL

## 2016-04-24 MED ORDER — SENNOSIDES-DOCUSATE SODIUM 8.6-50 MG PO TABS
1.0000 | ORAL_TABLET | Freq: Two times a day (BID) | ORAL | Status: DC
  Administered 2016-04-25 – 2016-04-27 (×5): 1 via ORAL
  Filled 2016-04-24 (×5): qty 1

## 2016-04-24 NOTE — Progress Notes (Signed)
[place] at [facility] was admitted to the Hospital on 04/21/2016 and  Still is in hospital. His daughter Terrence Dupont walker has been here with him.  for 3 days starting 04/21/2016 ,  Please excuse her from work for these days.  Call Dustin Flock MD with questions.  Dustin Flock M.D on 04/24/2016,at 10:03 AM  [place] at [facility]  [phone]

## 2016-04-24 NOTE — Progress Notes (Signed)
[place] at [facility]                                                                                                                                                                                            Patient Demographics   Bobby Peterson, is a 75 y.o. male, DOB - Jan 23, 1941, [id number]  Admit date - 04/21/2016   Admitting Physician Vilinda Boehringer, MD  Outpatient Primary MD for the patient is Lelon Huh, MD   LOS - 3  Subjective: Patient on precedex drip, little less agiated this morning, family at bed side     Review of Systems:   CONSTITUTIONAL:Unable to provide due to mental status  Vitals:   Vitals:   04/24/16 0805 04/24/16 0900 04/24/16 1000 04/24/16 1100  BP:  (!) 141/71 140/76 137/74  Pulse:  86 86 73  Resp:  (!) 25 (!) 22 19  Temp:      TempSrc:      SpO2: 98% 100% 100% 100%  Weight:      Height:        Wt Readings from Last 3 Encounters:  04/24/16 194 lb 14.2 oz (88.4 kg)  02/12/16 190 lb (86.2 kg)  01/08/16 191 lb (86.6 kg)     Intake/Output Summary (Last 24 hours) at 04/24/16 1208 Last data filed at 04/24/16 1100  Gross per 24 hour  Intake          2482.15 ml  Output             1860 ml  Net           622.15 ml    Physical Exam:   GENERAL:Sedated.  HEAD, EYES, EARS, NOSE AND THROAT: Atraumatic, normocephalic.  Pupils equal and reactive to light. Sclerae anicteric. No conjunctival injection. No oro-pharyngeal erythema.  NECK: Supple. There is no jugular venous distention. No bruits, no lymphadenopathy, no thyromegaly.  HEART: Regular rate and rhythm,. No murmurs, no rubs, no clicks.  LUNGS: Left lung diminished breath sounds without any rhonchi or rales  ABDOMEN: Soft, flat, nontender, nondistended. Has good bowel sounds. No hepatosplenomegaly appreciated.  EXTREMITIES: No evidence of any cyanosis, clubbing, or peripheral edema.  +2 pedal and radial pulses bilaterally.  NEUROLOGIC: Sedated SKIN:  Moist and warm with no rashes appreciated.  Psych: Sedated LN: No inguinal LN enlargement    Antibiotics   Anti-infectives    Start     Dose/Rate Route Frequency Ordered Stop   04/24/16 1800  Levofloxacin (LEVAQUIN) IVPB 250 mg     250 mg 50 mL/hr over 60 Minutes Intravenous Every 24 hours 04/23/16 1332  04/23/16 1800  levofloxacin (LEVAQUIN) IVPB 500 mg     500 mg 100 mL/hr over 60 Minutes Intravenous  Once 04/23/16 1332 04/23/16 1933   04/23/16 1330  levofloxacin (LEVAQUIN) IVPB 500 mg  Status:  Discontinued     500 mg 100 mL/hr over 60 Minutes Intravenous Every 24 hours 04/23/16 1317 04/23/16 1332   04/21/16 1900  vancomycin (VANCOCIN) IVPB 1000 mg/200 mL premix  Status:  Discontinued     1,000 mg 200 mL/hr over 60 Minutes Intravenous Every 24 hours 04/21/16 1652 04/23/16 1317   04/21/16 1400  piperacillin-tazobactam (ZOSYN) IVPB 3.375 g  Status:  Discontinued     3.375 g 12.5 mL/hr over 240 Minutes Intravenous Every 8 hours 04/21/16 0848 04/23/16 1317   04/21/16 1130  azithromycin (ZITHROMAX) 500 mg in dextrose 5 % 250 mL IVPB  Status:  Discontinued     500 mg 250 mL/hr over 60 Minutes Intravenous Every 24 hours 04/21/16 1021 04/23/16 1332   04/21/16 0715  piperacillin-tazobactam (ZOSYN) IVPB 3.375 g  Status:  Discontinued     3.375 g 12.5 mL/hr over 240 Minutes Intravenous Every 6 hours 04/21/16 0709 04/21/16 0847   04/21/16 0600  vancomycin (VANCOCIN) IVPB 1000 mg/200 mL premix     1,000 mg 200 mL/hr over 60 Minutes Intravenous  Once 04/21/16 0554 04/21/16 0658   04/21/16 0600  piperacillin-tazobactam (ZOSYN) IVPB 3.375 g     3.375 g 100 mL/hr over 30 Minutes Intravenous  Once 04/21/16 0554 04/21/16 0651      Medications   Scheduled Meds: . budesonide (PULMICORT) nebulizer solution  0.5 mg Nebulization BID  . enoxaparin (LOVENOX) injection  40 mg Subcutaneous Q24H  . famotidine (PEPCID) IV  20 mg Intravenous Q24H  . insulin aspart  0-15 Units Subcutaneous Q4H  .  ipratropium-albuterol  3 mL Nebulization Q6H  . levofloxacin (LEVAQUIN) IV  250 mg Intravenous Q24H  . mouth rinse  15 mL Mouth Rinse BID  . methylPREDNISolone (SOLU-MEDROL) injection  40 mg Intravenous Daily   Followed by  . [START ON 04/26/2016] methylPREDNISolone (SOLU-MEDROL) injection  30 mg Intravenous Daily   Followed by  . [START ON 04/29/2016] methylPREDNISolone (SOLU-MEDROL) injection  20 mg Intravenous Daily   Followed by  . [START ON 05/02/2016] methylPREDNISolone (SOLU-MEDROL) injection  10 mg Intravenous Daily  . metoprolol tartrate  25 mg Oral Q8H  . nicotine  14 mg Transdermal Daily  . potassium & sodium phosphates  1 packet Oral Q4H  . QUEtiapine  25 mg Oral QHS  . senna-docusate  1 tablet Oral BID  . sodium chloride flush  10-40 mL Intracatheter Q12H   Continuous Infusions: . dexmedetomidine 0.4 mcg/kg/hr (04/24/16 1100)  . diltiazem (CARDIZEM) infusion Stopped (04/24/16 0118)  . fentaNYL infusion INTRAVENOUS Stopped (04/22/16 0741)  . norepinephrine (LEVOPHED) Adult infusion Stopped (04/22/16 1417)   PRN Meds:.sodium chloride, diazepam, fentaNYL, haloperidol lactate, ipratropium-albuterol, midazolam, sodium chloride flush   Data Review:   Micro Results Recent Results (from the past 240 hour(s))  Blood culture (routine x 2)     Status: None (Preliminary result)   Collection Time: 04/21/16  5:25 AM  Result Value Ref Range Status   Specimen Description BLOOD LEFT WRIST  Final   Special Requests   Final    BOTTLES DRAWN AEROBIC AND ANAEROBIC AER 9ML ANA 6ML   Culture NO GROWTH 3 DAYS  Final   Report Status PENDING  Incomplete  Blood culture (routine x 2)     Status: None (Preliminary result)  Collection Time: 04/21/16  5:25 AM  Result Value Ref Range Status   Specimen Description BLOOD RIGHT FORE ARM  Final   Special Requests   Final    BOTTLES DRAWN AEROBIC AND ANAEROBIC AER 4ML ANA 3ML   Culture NO GROWTH 3 DAYS  Final   Report Status PENDING  Incomplete   Urine culture     Status: None   Collection Time: 04/21/16  6:30 AM  Result Value Ref Range Status   Specimen Description URINE, RANDOM  Final   Special Requests NONE  Final   Culture NO GROWTH Performed at [facility]   Final   Report Status 04/22/2016 FINAL  Final  Rapid Influenza A&B Antigens ([place] only)     Status: None   Collection Time: 04/21/16  6:34 AM  Result Value Ref Range Status   Influenza A ([facility]) NEGATIVE NEGATIVE Final   Influenza B ([facility]) NEGATIVE NEGATIVE Final  MRSA PCR Screening     Status: Abnormal   Collection Time: 04/21/16  8:42 AM  Result Value Ref Range Status   MRSA by PCR POSITIVE (A) NEGATIVE Final    Comment:        The GeneXpert MRSA Assay (FDA approved for NASAL specimens only), is one component of a comprehensive MRSA colonization surveillance program. It is not intended to diagnose MRSA infection nor to guide or monitor treatment for MRSA infections. RESULT CALLED TO, READ BACK BY AND VERIFIED WITH: NIA DILE 04/21/16 1051 SGD   Culture, respiratory (NON-Expectorated)     Status: None   Collection Time: 04/21/16 12:01 PM  Result Value Ref Range Status   Specimen Description TRACHEAL ASPIRATE  Final   Special Requests NONE  Final   Gram Stain   Final    ABUNDANT WBC PRESENT,BOTH PMN AND MONONUCLEAR RARE GRAM POSITIVE COCCI IN PAIRS RARE GRAM NEGATIVE RODS RARE YEAST Performed at [place]  Final   Report Status 04/23/2016 FINAL  Final   Organism ID, Bacteria KLEBSIELLA PNEUMONIAE  Final      Susceptibility   Klebsiella pneumoniae - MIC*    AMPICILLIN >=32 RESISTANT Resistant     CEFAZOLIN <=4 SENSITIVE Sensitive     CEFEPIME <=1 SENSITIVE Sensitive     CEFTAZIDIME <=1 SENSITIVE Sensitive     CEFTRIAXONE <=1 SENSITIVE Sensitive     CIPROFLOXACIN <=0.25 SENSITIVE Sensitive     GENTAMICIN <=1 SENSITIVE Sensitive     IMIPENEM <=0.25 SENSITIVE Sensitive     TRIMETH/SULFA <=20  SENSITIVE Sensitive     AMPICILLIN/SULBACTAM 4 SENSITIVE Sensitive     PIP/TAZO <=4 SENSITIVE Sensitive     Extended ESBL NEGATIVE Sensitive     * FEW KLEBSIELLA PNEUMONIAE    Radiology Reports Dg Chest 1 View  Result Date: 04/24/2016 CLINICAL DATA:  Pneumonia. EXAM: CHEST 1 VIEW COMPARISON:  04/21/2016. FINDINGS: Right jugular catheter tip in the superior vena cava. The endotracheal and nasogastric tubes have been removed. The heart remains grossly normal in size. Mild increase in diffuse prominence of the interstitial markings. No significant change in airspace opacity throughout the left lung. Interval small left pleural effusion. Diffuse osteopenia. IMPRESSION: 1. Stable left lung pneumonia. 2. Interval small left pleural effusion. 3. Interval mild interstitial pulmonary edema. Electronically Signed   By: Claudie Revering M.D.   On: 04/24/2016 08:03   Dg Abdomen 1 View  Result Date: 04/21/2016 CLINICAL DATA:  Enteric tube placement. EXAM: ABDOMEN - 1 VIEW COMPARISON:  CT  abdomen and pelvis 01/12/2015 FINDINGS: An enteric tube has been placed with tip in the left upper quadrant consistent with location in the upper stomach. Multiple superimposed lines and wires. These are likely extrinsic. Surgical clips in the right upper quadrant. Visualized bowel gas pattern is unremarkable. Left pleural effusion with left pulmonary infiltrates. IMPRESSION: Enteric tube tip localizes to the left upper quadrant consistent with location in the upper stomach. Electronically Signed   By: Lucienne Capers M.D.   On: 04/21/2016 06:41   Dg Chest Portable 1 View  Result Date: 04/21/2016 CLINICAL DATA:  Central line placement EXAM: PORTABLE CHEST 1 VIEW COMPARISON:  04/21/2016 FINDINGS: Endotracheal tube remains in good position. Right jugular central venous catheter tip in the mid SVC without pneumothorax. NG tube enters the stomach Extensive infiltrate in the left upper lobe and left lower lobe is unchanged and  compatible with pneumonia. Small left effusion. Right lung remains clear with minimal right lower lobe atelectasis IMPRESSION: Central venous catheter tip in the SVC without pneumothorax Endotracheal tube in good position Extensive pneumonia left upper lobe and left lower lobe is unchanged. Electronically Signed   By: Franchot Gallo M.D.   On: 04/21/2016 08:00   Dg Chest Portable 1 View  Result Date: 04/21/2016 CLINICAL DATA:  Endotracheal tube placement. Patient's iodine ache with gasping respirations. EXAM: PORTABLE CHEST 1 VIEW COMPARISON:  01/27/2016 FINDINGS: Endotracheal tube placed with tip measuring 3.4 cm above the carina. Enteric tube tip is off the field of view but below the left hemidiaphragm. There is interval development since the previous study of diffuse patchy infiltration throughout the left lung most likely represent pneumonia. Small left pleural effusion. Right lung appears clear. IMPRESSION: Appliances appear in satisfactory position. Diffuse infiltration throughout the left lung with small effusion likely representing pneumonia. Electronically Signed   By: Lucienne Capers M.D.   On: 04/21/2016 06:39     CBC  Recent Labs Lab 04/21/16 0525 04/22/16 0407 04/24/16 0423  WBC 18.8* 37.5* 15.4*  HGB 16.1 13.3 12.4*  HCT 50.0 40.0 36.5*  PLT 324 267 175  MCV 85.9 85.0 82.6  MCH 27.7 28.2 28.1  MCHC 32.2 33.2 34.1  RDW 15.9* 16.1* 16.2*  LYMPHSABS 2.5  --   --   MONOABS 0.1*  --   --   EOSABS 0.1  --   --   BASOSABS 0.1  --   --     Chemistries   Recent Labs Lab 04/21/16 0525 04/22/16 0407 04/23/16 0441 04/24/16 0423  NA 142 138 138 141  K 4.0 4.4 3.8 3.4*  CL 104 108 110 111  CO2 29 22 23 24   GLUCOSE 178* 264* 175* 143*  BUN 15 24* 26* 31*  CREATININE 1.87* 2.28* 1.83* 1.54*  CALCIUM 8.7* 7.4* 8.1* 8.2*  MG  --  1.5* 2.0 2.1  AST 23  --   --   --   ALT 16*  --   --   --   ALKPHOS 102  --   --   --   BILITOT 0.2*  --   --   --     ------------------------------------------------------------------------------------------------------------------ estimated creatinine clearance is 42.6 mL/min (by C-G formula based on SCr of 1.54 mg/dL (H)). ------------------------------------------------------------------------------------------------------------------ No results for input(s): HGBA1C in the last 72 hours. ------------------------------------------------------------------------------------------------------------------ No results for input(s): CHOL, HDL, LDLCALC, TRIG, CHOLHDL, LDLDIRECT in the last 72 hours. ------------------------------------------------------------------------------------------------------------------ No results for input(s): TSH, T4TOTAL, T3FREE, THYROIDAB in the last 72 hours.  Invalid input(s): FREET3 ------------------------------------------------------------------------------------------------------------------  No results for input(s): VITAMINB12, FOLATE, FERRITIN, TIBC, IRON, RETICCTPCT in the last 72 hours.  Coagulation profile  Recent Labs Lab 04/21/16 0525  INR 1.02    No results for input(s): DDIMER in the last 72 hours.  Cardiac Enzymes  Recent Labs Lab 04/22/16 1720 04/23/16 0441 04/23/16 1400  TROPONINI <0.03 0.03* 0.07*   ------------------------------------------------------------------------------------------------------------------ Invalid input(s): POCBNP    Assessment & Plan  Patient 75 year old admitted with acute respiratory failure due to pneumonia 1.   Acute respiratory failure with hypoxia (HCC) Due to combination of acute on chronic COPD exasperation as well as pneumonia Patient's sputum cultures consistent with Klebsiella pneumonia Continue therapy with IV Levaquin for total antibiotic duration of 7 days ABG checked this morning does show some hypoxia continue oxygen therapy Chest x-ray reviewed showed no significant change in the pneumonia There is  now pleural effusions will stop IV fluids   2. Acute delirium Due to pneumonia as well as acute respiratory failure Patient's mental status is improved, attempt to wean off Precedex    3. 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   Chronic diastolic heart failure (HCC) currently compensated continue to monitor fluid status  5.   Chronic kidney disease (CKD), stage III (moderate) avoid nephrotoxins monitor renal    6. Atrial fibrillation with rapid ventricular rate continue Cardizem  7. Diabetes type 2 continue sliding scale insulin     Code Status Orders        Start     Ordered   04/21/16 0702  Full code  Continuous     04/21/16 0702    Code Status History    Date Active Date Inactive Code Status Order ID Comments User Context   04/21/2016  7:02 AM 04/21/2016  5:03 PM Full Code [id number]  Holley Raring, NP ED   01/04/2015  2:01 PM 01/07/2015  5:37 PM Full Code [id number]  Greggory Keen, MD Inpatient   01/03/2015  3:03 PM 01/04/2015  2:01 PM Full Code [id number]  Christene Lye, MD Inpatient     Once patient is weaned off Precedex can be transferred to a regular floor with telemetry      Consults  Pulmonary   DVT Prophylaxis  Lovenox  Lab Results  Component Value Date   PLT 175 04/24/2016     Time Spent in minutes   52minutes of critical care time spent high risk of cardiopulmonary arrest  Dustin Flock M.D on 04/24/2016 at 12:08 PM  Between 7am to 6pm - Pager - [phone]  After 6pm go to [email or web address] - password [email or web address] [facility]   Office  [phone]

## 2016-04-24 NOTE — Progress Notes (Signed)
MEDICATION RELATED PHARMACY CONSULT NOTE- Antibiotic/Electrolytes/Constipation Consults   75 yo male ICU patient s/p mechanical ventilation 9/23  Plan:   1. Antibiotics: Patient on Zosyn and Azithromycin 500mg  IV x 5 doses. Will continue vancomycin 1000mg  IV Q24hr for goal trough of 15-20. Trough with 4th dose.  SCr increased 9/22 then decreased 9/23.  TRach aspirate= + Klebsiella Pneumonia (resis to ampicillin), few GPC, few yeast.    9/23- ABX changed to Levaquin  2. Electrolytes: K= 3.4, Mag=2.1  Phos= 1.7.   NP ordered KCL 35meq po packet x 1.  Will order Potassium/Sodium Phosphate packet -1 packet q4h x 4 doses.  f/u AM labs.   3. Constipation prevention: to start clear liquids 9/23. Will add Senna/docusate 9/24. F/u.    Allergies  Allergen Reactions  . Fenofibrate     Other reaction(s): Headache  . Lisinopril Cough  . Niacin     Other reaction(s): Dizziness  . Phenergan [Promethazine Hcl]     NVD  . Simvastatin     Other reaction(s): Headache    Patient Measurements: Height: 5\' 7"  (170.2 cm) Weight: 194 lb 14.2 oz (88.4 kg) IBW/kg (Calculated) : 66.1  Vital Signs: Temp: 98.3 F (36.8 C) (09/24 0800) Temp Source: Oral (09/24 0800) BP: 141/71 (09/24 0900) Pulse Rate: 86 (09/24 0900) Intake/Output from previous day: 09/23 0701 - 09/24 0700 In: 2748.2 [P.O.:180; I.V.:2068.2; IV Piggyback:500] Out: 2085 [Urine:2085] Intake/Output from this shift: Total I/O In: 336 [I.V.:336] Out: 125 [Urine:125]  Labs:  Recent Labs  04/22/16 0407 04/23/16 0441 04/24/16 0423  WBC 37.5*  --  15.4*  HGB 13.3  --  12.4*  HCT 40.0  --  36.5*  PLT 267  --  175  CREATININE 2.28* 1.83* 1.54*  MG 1.5* 2.0 2.1  PHOS 3.1  --  1.7*   Estimated Creatinine Clearance: 42.6 mL/min (by C-G formula based on SCr of 1.54 mg/dL (H)).   Microbiology: Recent Results (from the past 720 hour(s))  Blood culture (routine x 2)     Status: None (Preliminary result)   Collection Time:  04/21/16  5:25 AM  Result Value Ref Range Status   Specimen Description BLOOD LEFT WRIST  Final   Special Requests   Final    BOTTLES DRAWN AEROBIC AND ANAEROBIC AER 9ML ANA 6ML   Culture NO GROWTH 3 DAYS  Final   Report Status PENDING  Incomplete  Blood culture (routine x 2)     Status: None (Preliminary result)   Collection Time: 04/21/16  5:25 AM  Result Value Ref Range Status   Specimen Description BLOOD RIGHT FORE ARM  Final   Special Requests   Final    BOTTLES DRAWN AEROBIC AND ANAEROBIC AER 4ML ANA 3ML   Culture NO GROWTH 3 DAYS  Final   Report Status PENDING  Incomplete  Urine culture     Status: None   Collection Time: 04/21/16  6:30 AM  Result Value Ref Range Status   Specimen Description URINE, RANDOM  Final   Special Requests NONE  Final   Culture NO GROWTH Performed at [facility]   Final   Report Status 04/22/2016 FINAL  Final  Rapid Influenza A&B Antigens ([place] only)     Status: None   Collection Time: 04/21/16  6:34 AM  Result Value Ref Range Status   Influenza A ([facility]) NEGATIVE NEGATIVE Final   Influenza B ([facility]) NEGATIVE NEGATIVE Final  MRSA PCR Screening     Status: Abnormal   Collection Time: 04/21/16  8:42 AM  Result Value Ref Range Status   MRSA by PCR POSITIVE (A) NEGATIVE Final    Comment:        The GeneXpert MRSA Assay (FDA approved for NASAL specimens only), is one component of a comprehensive MRSA colonization surveillance program. It is not intended to diagnose MRSA infection nor to guide or monitor treatment for MRSA infections. RESULT CALLED TO, READ BACK BY AND VERIFIED WITH: NIA DILE 04/21/16 1051 SGD   Culture, respiratory (NON-Expectorated)     Status: None   Collection Time: 04/21/16 12:01 PM  Result Value Ref Range Status   Specimen Description TRACHEAL ASPIRATE  Final   Special Requests NONE  Final   Gram Stain   Final    ABUNDANT WBC PRESENT,BOTH PMN AND MONONUCLEAR RARE GRAM POSITIVE COCCI IN PAIRS RARE GRAM  NEGATIVE RODS RARE YEAST Performed at [facility]  Final   Report Status 04/23/2016 FINAL  Final   Organism ID, Bacteria KLEBSIELLA PNEUMONIAE  Final      Susceptibility   Klebsiella pneumoniae - MIC*    AMPICILLIN >=32 RESISTANT Resistant     CEFAZOLIN <=4 SENSITIVE Sensitive     CEFEPIME <=1 SENSITIVE Sensitive     CEFTAZIDIME <=1 SENSITIVE Sensitive     CEFTRIAXONE <=1 SENSITIVE Sensitive     CIPROFLOXACIN <=0.25 SENSITIVE Sensitive     GENTAMICIN <=1 SENSITIVE Sensitive     IMIPENEM <=0.25 SENSITIVE Sensitive     TRIMETH/SULFA <=20 SENSITIVE Sensitive     AMPICILLIN/SULBACTAM 4 SENSITIVE Sensitive     PIP/TAZO <=4 SENSITIVE Sensitive     Extended ESBL NEGATIVE Sensitive     * [place] will continue to monitor and adjust per consult.   Tela Kotecki A 04/24/2016,10:20 AM

## 2016-04-25 ENCOUNTER — Inpatient Hospital Stay: Payer: Commercial Managed Care - HMO

## 2016-04-25 LAB — BASIC METABOLIC PANEL
ANION GAP: 7 (ref 5–15)
BUN: 33 mg/dL — ABNORMAL HIGH (ref 6–20)
CALCIUM: 8.4 mg/dL — AB (ref 8.9–10.3)
CO2: 23 mmol/L (ref 22–32)
CREATININE: 1.48 mg/dL — AB (ref 0.61–1.24)
Chloride: 111 mmol/L (ref 101–111)
GFR, EST AFRICAN AMERICAN: 51 mL/min — AB (ref >60)
GFR, EST NON AFRICAN AMERICAN: 44 mL/min — AB (ref >60)
Glucose, Bld: 137 mg/dL — ABNORMAL HIGH (ref 65–99)
Potassium: 3.8 mmol/L (ref 3.5–5.1)
Sodium: 141 mmol/L (ref 135–145)

## 2016-04-25 LAB — GLUCOSE, CAPILLARY
GLUCOSE-CAPILLARY: 106 mg/dL — AB (ref 65–99)
GLUCOSE-CAPILLARY: 106 mg/dL — AB (ref 65–99)
GLUCOSE-CAPILLARY: 126 mg/dL — AB (ref 65–99)
GLUCOSE-CAPILLARY: 96 mg/dL (ref 65–99)
Glucose-Capillary: 126 mg/dL — ABNORMAL HIGH (ref 65–99)
Glucose-Capillary: 123 mg/dL — ABNORMAL HIGH (ref 65–99)
Glucose-Capillary: 130 mg/dL — ABNORMAL HIGH (ref 65–99)

## 2016-04-25 LAB — PHOSPHORUS: Phosphorus: 3.2 mg/dL (ref 2.5–4.6)

## 2016-04-25 LAB — MAGNESIUM: MAGNESIUM: 2 mg/dL (ref 1.7–2.4)

## 2016-04-25 MED ORDER — METOPROLOL TARTRATE 25 MG PO TABS
25.0000 mg | ORAL_TABLET | Freq: Once | ORAL | Status: AC
  Administered 2016-04-25: 25 mg via ORAL
  Filled 2016-04-25: qty 1

## 2016-04-25 MED ORDER — CHLORHEXIDINE GLUCONATE CLOTH 2 % EX PADS
6.0000 | MEDICATED_PAD | Freq: Every day | CUTANEOUS | Status: DC
  Administered 2016-04-27: 6 via TOPICAL

## 2016-04-25 MED ORDER — INSULIN ASPART 100 UNIT/ML ~~LOC~~ SOLN
0.0000 [IU] | Freq: Three times a day (TID) | SUBCUTANEOUS | Status: DC
  Administered 2016-04-26: 2 [IU] via SUBCUTANEOUS
  Administered 2016-04-26 (×2): 3 [IU] via SUBCUTANEOUS
  Filled 2016-04-25: qty 2
  Filled 2016-04-25 (×2): qty 3

## 2016-04-25 MED ORDER — INSULIN ASPART 100 UNIT/ML ~~LOC~~ SOLN: 0.0000 [IU] | Freq: Three times a day (TID) | SUBCUTANEOUS | Status: DC

## 2016-04-25 MED ORDER — FENTANYL CITRATE (PF) 100 MCG/2ML IJ SOLN
50.0000 ug | INTRAMUSCULAR | Status: DC | PRN
  Administered 2016-04-25 (×2): 50 ug via INTRAVENOUS
  Filled 2016-04-25 (×2): qty 2

## 2016-04-25 MED ORDER — MUPIROCIN 2 % EX OINT
1.0000 "application " | TOPICAL_OINTMENT | Freq: Two times a day (BID) | CUTANEOUS | Status: DC
  Administered 2016-04-25 – 2016-04-27 (×5): 1 via NASAL
  Filled 2016-04-25 (×2): qty 22

## 2016-04-25 MED ORDER — METOPROLOL TARTRATE 50 MG PO TABS
50.0000 mg | ORAL_TABLET | Freq: Two times a day (BID) | ORAL | Status: DC
  Administered 2016-04-25: 50 mg via ORAL
  Filled 2016-04-25: qty 1

## 2016-04-25 NOTE — Progress Notes (Signed)
[facility] Progess Note    ASSESSMENT/PLAN    DISCUSSION: Patient admitted With copd and left sided PNA. In ARF, mechanically ventilated; now extubated and doing well on 2 L nasal cannula of O2; course complicated by delirium/sundowning requiring precedex, improving.   ASSESSMENT / PLAN:  PULMONARY A: Acute on chronic respiratory failure COPD exacerbation Left sided PNA.  Chest x-ray images from 9/25 reviewed, persistent left-sided pneumonia, small left parapneumonic effusion.  P: Supplemental oxygen via nasal cannula to keep oxygen saturation greater than 90% Cont with nebs and steroid taper  CARDIOVASCULAR A:  Septic Shock>resolved Hx of Hypertension Hx of CHF Atrial Tachycardia Essential htn.  P: Continuous telemetry Appreciate Cards recs - atrail tach with possible PACs related to PNA --Now off Cardizem drip, will increase metoprolol. ECHO 9/21-normal. EF  RENAL A:  Chronic kidney disease stage- 3 Hypokalemia P: Strict I/o  BMET in am Replace electrolytes per ICU protocol  GASTROINTESTINAL A:  No active issues P: Npo Famotidine for GIP  HEMATOLOGIC A:  No active issues P: lovenox for DVT prophylaxis Transfuse if Hgb<7  INFECTIOUS A:  Septic shock related to PNA-improving Left side PNA  P: Monitor fever curve Follow cultures Vanc/ zosyn discontinued and Levaquin started  CULTURES: 04/21/16 Blood cultures>> no growth to date 9//21/17 sputum cultures>> positive for Klebsiella pneumonia  ANTIBIOTICS: 9/21 vancomycin>> 04/23/2016 9/21 Zosyn>04/23/2016 04/23/2016> Levaquin   ENDOCRINE A:  Diabetese melitus P: Blood sugar checks SSI  NEUROLOGIC A:  No active issues P: RASS goal: 0 Wean off precedex drip. Soft wrist restraints as needed.    STUDIES:  9/21 ECHO>Left ventricular ejection fraction is 60-65%, normal pulmonary artery pressures, and normal heart  chambers.  LINES/TUBES: 9/21 right IJ TLC>>   ---------------------------------------   ----------------------------------------   Name: Bobby Peterson MRN: [id number] DOB: 1941-03-13    ADMISSION DATE:  04/21/2016   SUBJECTIVE:   Pt currently awake but disoriented. Does not follow commands or answer questions.  Review of Systems:  Cannot provide review of systems due to disorientation.   SpO2:  [89 %-100 %] 96 % (09/25 0746) Weight:  [90.1 kg (198 lb 10.2 oz)] 90.1 kg (198 lb 10.2 oz) (09/25 0700) HEMODYNAMICS:   VENTILATOR SETTINGS:   INTAKE / OUTPUT:  Intake/Output Summary (Last 24 hours) at 04/25/16 0824 Last data filed at 04/25/16 0547  Gross per 24 hour  Intake           245.55 ml  Output             2575 ml  Net         -2329.45 ml    PHYSICAL EXAMINATION: Physical Examination:   VS: BP (!) 149/89   Pulse 78   Temp 97 F (36.1 C) (Axillary)   Resp 17   Ht 5\' 7"  (1.702 m)   Wt 90.1 kg (198 lb 10.2 oz)   SpO2 96%   BMI 31.11 kg/m   General Appearance: No distress  Neuro:without focal findings, mental status ConfusedRLA, EOM intact. Pulmocrackles on the left base.iovascularNormal S1,S2.  No m/r/g.   Abdomen: Benign, Soft, non-tender. Renal:  No costovertebral tenderness  GU:  Not performed at this time. Endocrine: No evident thyromegaly. Skin:   warm, no rashes, no ecchymosis  Extremities: normal, no cyanosis, clubbing.   LABS:   LABORATORY PANEL:   CBC  Recent Labs Lab 04/24/16 0423  WBC 15.4*  HGB 12.4*  HCT 36.5*  PLT 175    Chemistries   Recent  Labs Lab 04/21/16 0525  04/25/16 0420  NA 142  < > 141  K 4.0  < > 3.8  CL 104  < > 111  CO2 29  < > 23  GLUCOSE 178*  < > 137*  BUN 15  < > 33*  CREATININE 1.87*  < > 1.48*  CALCIUM 8.7*  < > 8.4*  MG  --   < > 2.0  PHOS  --   < > 3.2  AST 23  --   --   ALT 16*  --   --   ALKPHOS 102  --   --   BILITOT 0.2*  --   --   < > = values in this interval not  displayed.   Recent Labs Lab 04/24/16 1136 04/24/16 1600 04/24/16 1939 04/25/16 0015 04/25/16 0427 04/25/16 0706  GLUCAP 138* 127* 110* 126* 126* 123*    Recent Labs Lab 04/21/16 1130 04/22/16 0419 04/24/16 0752  PHART 7.31* 7.28* 7.42  PCO2ART 41 46 35  PO2ART 83 69* 66*    Recent Labs Lab 04/21/16 0525  AST 23  ALT 16*  ALKPHOS 102  BILITOT 0.2*  ALBUMIN 3.9    Cardiac Enzymes  Recent Labs Lab 04/23/16 1400  TROPONINI 0.07*    RADIOLOGY:  Dg Chest 1 View  Result Date: 04/24/2016 CLINICAL DATA:  Pneumonia. EXAM: CHEST 1 VIEW COMPARISON:  04/21/2016. FINDINGS: Right jugular catheter tip in the superior vena cava. The endotracheal and nasogastric tubes have been removed. The heart remains grossly normal in size. Mild increase in diffuse prominence of the interstitial markings. No significant change in airspace opacity throughout the left lung. Interval small left pleural effusion. Diffuse osteopenia. IMPRESSION: 1. Stable left lung pneumonia. 2. Interval small left pleural effusion. 3. Interval mild interstitial pulmonary edema. Electronically Signed   By: Claudie Revering M.D.   On: 04/24/2016 08:03   Dg Chest Port 1 View  Result Date: 04/25/2016 CLINICAL DATA:  75 year old male with central line placement EXAM: PORTABLE CHEST 1 VIEW COMPARISON:  Chest radiograph dated 04/24/2016 FINDINGS: Right IJ central line with tip over central SVC is stable positioning. There is no pneumothorax. Diffuse left lung interstitial and nodular opacities as well as small left pleural effusion similar to prior radiograph. Right lung base atelectatic changes versus infiltrate. There is stable cardiac silhouette. No acute osseous pathology. IMPRESSION: Right IJ central line with tip over central SVC in stable positioning. No pneumothorax. Stable appearance of the left lung infiltrate and left pleural effusion. Follow-up recommended. Electronically Signed   By: Anner Crete M.D.   On:  04/25/2016 04:18       --Marda Stalker, MD.  ICU Pager: [phone] [facility] Office Number: [phone]  Patricia Pesa, M.D.  Vilinda Boehringer, M.D.  Merton Border, M.D  04/25/2016   Critical Care Attestation.  I have personally obtained a history, examined the patient, evaluated laboratory and imaging results, formulated the assessment and plan and placed orders. The Patient requires high complexity decision making for assessment and support, frequent evaluation and titration of therapies, application of advanced monitoring technologies and extensive interpretation of multiple databases. The patient has critical illness that could lead imminently to failure of 1 or more organ systems and requires the highest level of physician preparedness to intervene.  Critical Care Time devoted to patient care services described in this note is 35 minutes and is exclusive of time spent in procedures.

## 2016-04-25 NOTE — Progress Notes (Signed)
[place] consulted for electrolyte and constipation management in this 28 yoM admitted with respiratory failure and PNA.  1. Electrolytes: 9/25 am labs: K = 3.8; Ca = 8.4; Phos = 3.2; mag = 2.0. No need for replacement today. Will f/u with am labs  2. Constipation prevention: BM on 9/24. Continue senna/docuate. Continue montioring  3.  Abx: Pt on day 3 of levofloxacin. Day 5 of total antibiotic days.   Allergies  Allergen Reactions  . Fenofibrate     Other reaction(s): Headache  . Lisinopril Cough  . Niacin     Other reaction(s): Dizziness  . Phenergan [Promethazine Hcl]     NVD  . Simvastatin     Other reaction(s): Headache    Patient Measurements: Height: 5\' 7"  (170.2 cm) Weight: 198 lb 10.2 oz (90.1 kg) IBW/kg (Calculated) : 66.1   Vital Signs: Temp: 98.3 F (36.8 C) (09/25 0800) Temp Source: Axillary (09/25 0800) BP: 166/103 (09/25 1100) Pulse Rate: 55 (09/25 1100) Intake/Output from previous day: 09/24 0701 - 09/25 0700 In: 497.6 [I.V.:497.6] Out: 2575 [Urine:2575] Intake/Output from this shift: Total I/O In: 81 [I.V.:81] Out: 775 [Urine:775]  Labs:  Recent Labs  04/24/16 0423  WBC 15.4*  HGB 12.4*  HCT 36.5*  PLT 175     Recent Labs  04/23/16 0441 04/24/16 0423 04/25/16 0420  NA 138 141 141  K 3.8 3.4* 3.8  CL 110 111 111  CO2 23 24 23   GLUCOSE 175* 143* 137*  BUN 26* 31* 33*  CREATININE 1.83* 1.54* 1.48*  CALCIUM 8.1* 8.2* 8.4*  MG 2.0 2.1 2.0  PHOS  --  1.7* 3.2   Estimated Creatinine Clearance: 44.8 mL/min (by C-G formula based on SCr of 1.48 mg/dL (H)).    Recent Labs  04/25/16 0427 04/25/16 0706 04/25/16 1110  GLUCAP 126* 123* 106*    Medications:  Scheduled:  . budesonide (PULMICORT) nebulizer solution  0.5 mg Nebulization BID  . enoxaparin (LOVENOX) injection  40 mg Subcutaneous Q24H  . famotidine (PEPCID) IV  20 mg Intravenous Q24H  . insulin aspart  0-15 Units Subcutaneous Q4H  .  ipratropium-albuterol  3 mL Nebulization Q6H  . levofloxacin (LEVAQUIN) IV  250 mg Intravenous Q24H  . mouth rinse  15 mL Mouth Rinse BID  . [START ON 04/26/2016] methylPREDNISolone (SOLU-MEDROL) injection  30 mg Intravenous Daily   Followed by  . [START ON 04/29/2016] methylPREDNISolone (SOLU-MEDROL) injection  20 mg Intravenous Daily   Followed by  . [START ON 05/02/2016] methylPREDNISolone (SOLU-MEDROL) injection  10 mg Intravenous Daily  . metoprolol tartrate  50 mg Oral BID  . nicotine  14 mg Transdermal Daily  . QUEtiapine  25 mg Oral QHS  . senna-docusate  1 tablet Oral BID  . sodium chloride flush  10-40 mL Intracatheter Q12H   Infusions:  . dexmedetomidine 0.3 mcg/kg/hr (04/25/16 0800)  . diltiazem (CARDIZEM) infusion Stopped (04/24/16 0118)  . norepinephrine (LEVOPHED) Adult infusion Stopped (04/22/16 1417)    Darrow Bussing, PharmD Pharmacy Resident 04/25/2016 11:40 AM

## 2016-04-25 NOTE — Care Management (Signed)
Patient is currently in process of moving out of icu to 2A.  Cm will assess when transfer complete and patient is settled.  During ICU stay he was intubated for airway protection and on vent.  He is on nasal cannula and maintaining sats.  He has had significant delirium.  He presented from home and has chronic 02.

## 2016-04-25 NOTE — Evaluation (Signed)
Clinical/Bedside Swallow Evaluation Patient Details  Name: Bobby Peterson MRN: [id number] Date of Birth: March 07, 1941  Today's Date: 04/25/2016 Time: SLP Start Time (ACUTE ONLY): 1300 SLP Stop Time (ACUTE ONLY): 1400 SLP Time Calculation (min) (ACUTE ONLY): 60 min  Past Medical History:  Past Medical History:  Diagnosis Date  . Chronic diastolic CHF (congestive heart failure) ([place])   . Chronic respiratory failure (H[facility])    a. on home O2  . COPD (chronic obstructive pulmonary disease) ([place])   . Coronary artery disease   . Diabetes ([place])   . Hernia, umbilical   . Hypercholesteremia   . Hypertension   . Obesity   . Renal cancer [facility])    a. s/p nephrectomy in 1980's   Past Surgical History:  Past Surgical History:  Procedure Laterality Date  . CHOLECYSTECTOMY N/A 12/22/2014   Procedure: LAPAROSCOPIC CHOLECYSTECTOMY WITH INTRAOPERATIVE CHOLANGIOGRAM;  Surgeon: Christene Lye, MD;  Location: [facility] ORS;  Service: General;  Laterality: N/A;  . COLONOSCOPY    . ERCP N/A 01/05/2015   Procedure: ENDOSCOPIC RETROGRADE CHOLANGIOPANCREATOGRAPHY (ERCP);  Surgeon: Hulen Luster, MD;  Location: [facility] ENDOSCOPY;  Service: Endoscopy;  Laterality: N/A;  . ERCP N/A 02/19/2015   Procedure: ENDOSCOPIC RETROGRADE CHOLANGIOPANCREATOGRAPHY (ERCP);  Surgeon: Hulen Luster, MD;  Location: [facility] ENDOSCOPY;  Service: Gastroenterology;  Laterality: N/A;  . GALLBLADDER SURGERY  11/2014  . NEPHRECTOMY  1990  . OMENTECTOMY  12/22/2014   Procedure: OMENTECTOMY;  Surgeon: Christene Lye, MD;  Location: [facility] ORS;  Service: General;;  Partial omentectomy  . STENT REMOVAL    . UMBILICAL HERNIA REPAIR N/A 12/22/2014   Procedure: HERNIA REPAIR UMBILICAL ADULT;  Surgeon: Christene Lye, MD;  Location: [facility] ORS;  Service: General;  Laterality: N/A;   HPI:      Assessment / Plan / Recommendation Clinical Impression  Pt appeared to adequately tolerate just few trials (accepted by pt) of thin liquid and a soft  solid, ice chips. Pt exhibited no immediate coughing or other overt s/s of aspiration w/ the few trials. Oral phase c/b increased mastication/gumming time sec. to pt's edentulous status. Pt declined further po trials stating his lunch meal was ordered (wife ordered mashed potatoes w/ a hamburger). Education was given on general aspiration precautions and choosing foods for pt's meals that are broken down and easy to masticate to decreased chance of increased WOB during meals. Family agreed. ST services will f/u w/ pt's status while admitted for any further education as indicated.     Aspiration Risk   (reduced following precautions)    Diet Recommendation  Dysphagia 3, Thin liquids; aspiration precautions; assistance at meals.   Medication Administration: Whole meds with puree    Other  Recommendations Recommended Consults:  (Dietician) Oral Care Recommendations: Oral care BID;Staff/trained caregiver to provide oral care   Follow up Recommendations None      Frequency and Duration min 2x/week  1 week       Prognosis Prognosis for Safe Diet Advancement: Good      Swallow Study   General Date of Onset: 04/21/16 Type of Study: Bedside Swallow Evaluation Previous Swallow Assessment: none Diet Prior to this Study: Regular;Thin liquids (at home per wife/pt) Temperature Spikes Noted: No (wbc elevated) Respiratory Status: Nasal cannula (2-4 liters) History of Recent Intubation: Yes Length of Intubations (days): 2 days Date extubated: 04/22/16 Behavior/Cognition: Alert;Cooperative;Pleasant mood;Confused;Distractible;Requires cueing Oral Cavity Assessment: Dry Oral Care Completed by SLP: Recent completion by staff Oral Cavity - Dentition: Edentulous Vision: Functional for  self-feeding Self-Feeding Abilities: Able to feed self;Needs assist;Needs set up Patient Positioning: Upright in bed Baseline Vocal Quality: Low vocal intensity;Normal Volitional Cough: Strong Volitional Swallow:  Able to elicit    Oral/Motor/Sensory Function Overall Oral Motor/Sensory Function: Within functional limits   Ice Chips Ice chips: Within functional limits Presentation: Spoon (fed; 2 trials) Other Comments: he declined further   Thin Liquid Thin Liquid: Within functional limits Presentation: Straw (assisted; 1 trial) Other Comments: declined further    Nectar Thick Nectar Thick Liquid: Not tested   Honey Thick Honey Thick Liquid: Not tested   Puree Puree: Not tested   Solid   GO   Solid: Impaired Presentation: Spoon (fed; 1 trial) Oral Phase Impairments: Impaired mastication (edentulous) Oral Phase Functional Implications:  (increased oral phase time for mastication) Pharyngeal Phase Impairments:  (none)        Orinda Kenner, MS, CCC-SLP  Bobby Peterson 04/25/2016,3:13 PM

## 2016-04-25 NOTE — Progress Notes (Signed)
Pt became agitated at the beginning of the shift and experienced worsening agitation as the night progressed. Pt was constantly trying to get out of bed, pulling off leads and tried to pull his central line out twice. CXR ordered, central line still in place. Order for Air cabin crew received. Pt was maxed on Precedex and received Valium, Haldol and Fentanyl. At 5 am patient was not able to have any other PRN medications but was still very agitated. Throughout the night pt continually asked for [name], wife, and wanted her to come to the hospital. RN called [name] at 5 am as there were no more meds available to give to him and what was given only provided relief for a short time period. [name] came in and patient immediately calmed once she got here.

## 2016-04-25 NOTE — Progress Notes (Signed)
[place] at [facility]                                                                                                                                                                                            Patient Demographics   Bobby Peterson, is a 75 y.o. male, DOB - 1940-09-19, [id number]  Admit date - 04/21/2016   Admitting Physician Vilinda Boehringer, MD  Outpatient Primary MD for the patient is Lelon Huh, MD   LOS - 4  Subjective: Impression doing much better last night was agitated last night but this morning is more awake     Review of Systems:   CONSTITUTIONAL:Still some confusion  Vitals:   Vitals:   04/25/16 0900 04/25/16 1000 04/25/16 1100 04/25/16 1200  BP: (!) 167/86 (!) 164/100 (!) 166/103 (!) 164/82  Pulse: (!) 46 90 (!) 55 93  Resp: 20 (!) 21 19 (!) 21  Temp:      TempSrc:      SpO2: 96% 96% 95% 97%  Weight:      Height:        Wt Readings from Last 3 Encounters:  04/25/16 198 lb 10.2 oz (90.1 kg)  02/12/16 190 lb (86.2 kg)  01/08/16 191 lb (86.6 kg)     Intake/Output Summary (Last 24 hours) at 04/25/16 1323 Last data filed at 04/25/16 1100  Gross per 24 hour  Intake           206.55 ml  Output             3225 ml  Net         -3018.45 ml    Physical Exam:   GENERAL:In no acute distress now HEAD, EYES, EARS, NOSE AND THROAT: Atraumatic, normocephalic.  Pupils equal and reactive to light. Sclerae anicteric. No conjunctival injection. No oro-pharyngeal erythema.  NECK: Supple. There is no jugular venous distention. No bruits, no lymphadenopathy, no thyromegaly.  HEART: Regular rate and rhythm,. No murmurs, no rubs, no clicks.  LUNGS: Diminished breath sounds without any necessary muscle usage  ABDOMEN: Soft, flat, nontender, nondistended. Has good bowel sounds. No hepatosplenomegaly appreciated.  EXTREMITIES: No evidence of any cyanosis, clubbing, or peripheral edema.  +2 pedal and radial pulses  bilaterally.  NEUROLOGIC: Sedated SKIN: Moist and warm with no rashes appreciated.  Psych: Sedated LN: No inguinal LN enlargement    Antibiotics   Anti-infectives    Start     Dose/Rate Route Frequency Ordered Stop   04/24/16 1800  Levofloxacin (LEVAQUIN) IVPB 250 mg     250 mg 50 mL/hr over 60 Minutes Intravenous Every  24 hours 04/23/16 1332     04/23/16 1800  levofloxacin (LEVAQUIN) IVPB 500 mg     500 mg 100 mL/hr over 60 Minutes Intravenous  Once 04/23/16 1332 04/23/16 1933   04/23/16 1330  levofloxacin (LEVAQUIN) IVPB 500 mg  Status:  Discontinued     500 mg 100 mL/hr over 60 Minutes Intravenous Every 24 hours 04/23/16 1317 04/23/16 1332   04/21/16 1900  vancomycin (VANCOCIN) IVPB 1000 mg/200 mL premix  Status:  Discontinued     1,000 mg 200 mL/hr over 60 Minutes Intravenous Every 24 hours 04/21/16 1652 04/23/16 1317   04/21/16 1400  piperacillin-tazobactam (ZOSYN) IVPB 3.375 g  Status:  Discontinued     3.375 g 12.5 mL/hr over 240 Minutes Intravenous Every 8 hours 04/21/16 0848 04/23/16 1317   04/21/16 1130  azithromycin (ZITHROMAX) 500 mg in dextrose 5 % 250 mL IVPB  Status:  Discontinued     500 mg 250 mL/hr over 60 Minutes Intravenous Every 24 hours 04/21/16 1021 04/23/16 1332   04/21/16 0715  piperacillin-tazobactam (ZOSYN) IVPB 3.375 g  Status:  Discontinued     3.375 g 12.5 mL/hr over 240 Minutes Intravenous Every 6 hours 04/21/16 0709 04/21/16 0847   04/21/16 0600  vancomycin (VANCOCIN) IVPB 1000 mg/200 mL premix     1,000 mg 200 mL/hr over 60 Minutes Intravenous  Once 04/21/16 0554 04/21/16 0658   04/21/16 0600  piperacillin-tazobactam (ZOSYN) IVPB 3.375 g     3.375 g 100 mL/hr over 30 Minutes Intravenous  Once 04/21/16 0554 04/21/16 0651      Medications   Scheduled Meds: . budesonide (PULMICORT) nebulizer solution  0.5 mg Nebulization BID  . [START ON 04/26/2016] Chlorhexidine Gluconate Cloth  6 each Topical Q0600  . enoxaparin (LOVENOX) injection  40 mg  Subcutaneous Q24H  . famotidine (PEPCID) IV  20 mg Intravenous Q24H  . insulin aspart  0-15 Units Subcutaneous Q4H  . ipratropium-albuterol  3 mL Nebulization Q6H  . levofloxacin (LEVAQUIN) IV  250 mg Intravenous Q24H  . mouth rinse  15 mL Mouth Rinse BID  . [START ON 04/26/2016] methylPREDNISolone (SOLU-MEDROL) injection  30 mg Intravenous Daily   Followed by  . [START ON 04/29/2016] methylPREDNISolone (SOLU-MEDROL) injection  20 mg Intravenous Daily   Followed by  . [START ON 05/02/2016] methylPREDNISolone (SOLU-MEDROL) injection  10 mg Intravenous Daily  . metoprolol tartrate  50 mg Oral BID  . mupirocin ointment  1 application Nasal BID  . nicotine  14 mg Transdermal Daily  . QUEtiapine  25 mg Oral QHS  . senna-docusate  1 tablet Oral BID  . sodium chloride flush  10-40 mL Intracatheter Q12H   Continuous Infusions:   PRN Meds:.sodium chloride, diazepam, fentaNYL (SUBLIMAZE) injection, haloperidol lactate, ipratropium-albuterol, sodium chloride flush   Data Review:   Micro Results Recent Results (from the past 240 hour(s))  Blood culture (routine x 2)     Status: None (Preliminary result)   Collection Time: 04/21/16  5:25 AM  Result Value Ref Range Status   Specimen Description BLOOD LEFT WRIST  Final   Special Requests   Final    BOTTLES DRAWN AEROBIC AND ANAEROBIC AER 9ML ANA 6ML   Culture NO GROWTH 3 DAYS  Final   Report Status PENDING  Incomplete  Blood culture (routine x 2)     Status: None (Preliminary result)   Collection Time: 04/21/16  5:25 AM  Result Value Ref Range Status   Specimen Description BLOOD RIGHT FORE ARM  Final  Special Requests   Final    BOTTLES DRAWN AEROBIC AND ANAEROBIC AER 4ML ANA 3ML   Culture NO GROWTH 3 DAYS  Final   Report Status PENDING  Incomplete  Urine culture     Status: None   Collection Time: 04/21/16  6:30 AM  Result Value Ref Range Status   Specimen Description URINE, RANDOM  Final   Special Requests NONE  Final   Culture NO  GROWTH Performed at [facility]   Final   Report Status 04/22/2016 FINAL  Final  Rapid Influenza A&B Antigens ([place] only)     Status: None   Collection Time: 04/21/16  6:34 AM  Result Value Ref Range Status   Influenza A ([facility]) NEGATIVE NEGATIVE Final   Influenza B ([facility]) NEGATIVE NEGATIVE Final  MRSA PCR Screening     Status: Abnormal   Collection Time: 04/21/16  8:42 AM  Result Value Ref Range Status   MRSA by PCR POSITIVE (A) NEGATIVE Final    Comment:        The GeneXpert MRSA Assay (FDA approved for NASAL specimens only), is one component of a comprehensive MRSA colonization surveillance program. It is not intended to diagnose MRSA infection nor to guide or monitor treatment for MRSA infections. RESULT CALLED TO, READ BACK BY AND VERIFIED WITH: NIA DILE 04/21/16 1051 SGD   Culture, respiratory (NON-Expectorated)     Status: None   Collection Time: 04/21/16 12:01 PM  Result Value Ref Range Status   Specimen Description TRACHEAL ASPIRATE  Final   Special Requests NONE  Final   Gram Stain   Final    ABUNDANT WBC PRESENT,BOTH PMN AND MONONUCLEAR RARE GRAM POSITIVE COCCI IN PAIRS RARE GRAM NEGATIVE RODS RARE YEAST Performed at [place]  Final   Report Status 04/23/2016 FINAL  Final   Organism ID, Bacteria KLEBSIELLA PNEUMONIAE  Final      Susceptibility   Klebsiella pneumoniae - MIC*    AMPICILLIN >=32 RESISTANT Resistant     CEFAZOLIN <=4 SENSITIVE Sensitive     CEFEPIME <=1 SENSITIVE Sensitive     CEFTAZIDIME <=1 SENSITIVE Sensitive     CEFTRIAXONE <=1 SENSITIVE Sensitive     CIPROFLOXACIN <=0.25 SENSITIVE Sensitive     GENTAMICIN <=1 SENSITIVE Sensitive     IMIPENEM <=0.25 SENSITIVE Sensitive     TRIMETH/SULFA <=20 SENSITIVE Sensitive     AMPICILLIN/SULBACTAM 4 SENSITIVE Sensitive     PIP/TAZO <=4 SENSITIVE Sensitive     Extended ESBL NEGATIVE Sensitive     * FEW KLEBSIELLA PNEUMONIAE    Radiology  Reports Dg Chest 1 View  Result Date: 04/24/2016 CLINICAL DATA:  Pneumonia. EXAM: CHEST 1 VIEW COMPARISON:  04/21/2016. FINDINGS: Right jugular catheter tip in the superior vena cava. The endotracheal and nasogastric tubes have been removed. The heart remains grossly normal in size. Mild increase in diffuse prominence of the interstitial markings. No significant change in airspace opacity throughout the left lung. Interval small left pleural effusion. Diffuse osteopenia. IMPRESSION: 1. Stable left lung pneumonia. 2. Interval small left pleural effusion. 3. Interval mild interstitial pulmonary edema. Electronically Signed   By: Claudie Revering M.D.   On: 04/24/2016 08:03   Dg Abdomen 1 View  Result Date: 04/21/2016 CLINICAL DATA:  Enteric tube placement. EXAM: ABDOMEN - 1 VIEW COMPARISON:  CT abdomen and pelvis 01/12/2015 FINDINGS: An enteric tube has been placed with tip in the left upper quadrant consistent with location in the upper  stomach. Multiple superimposed lines and wires. These are likely extrinsic. Surgical clips in the right upper quadrant. Visualized bowel gas pattern is unremarkable. Left pleural effusion with left pulmonary infiltrates. IMPRESSION: Enteric tube tip localizes to the left upper quadrant consistent with location in the upper stomach. Electronically Signed   By: Lucienne Capers M.D.   On: 04/21/2016 06:41   Dg Chest Port 1 View  Result Date: 04/25/2016 CLINICAL DATA:  75 year old male with central line placement EXAM: PORTABLE CHEST 1 VIEW COMPARISON:  Chest radiograph dated 04/24/2016 FINDINGS: Right IJ central line with tip over central SVC is stable positioning. There is no pneumothorax. Diffuse left lung interstitial and nodular opacities as well as small left pleural effusion similar to prior radiograph. Right lung base atelectatic changes versus infiltrate. There is stable cardiac silhouette. No acute osseous pathology. IMPRESSION: Right IJ central line with tip over  central SVC in stable positioning. No pneumothorax. Stable appearance of the left lung infiltrate and left pleural effusion. Follow-up recommended. Electronically Signed   By: Anner Crete M.D.   On: 04/25/2016 04:18   Dg Chest Portable 1 View  Result Date: 04/21/2016 CLINICAL DATA:  Central line placement EXAM: PORTABLE CHEST 1 VIEW COMPARISON:  04/21/2016 FINDINGS: Endotracheal tube remains in good position. Right jugular central venous catheter tip in the mid SVC without pneumothorax. NG tube enters the stomach Extensive infiltrate in the left upper lobe and left lower lobe is unchanged and compatible with pneumonia. Small left effusion. Right lung remains clear with minimal right lower lobe atelectasis IMPRESSION: Central venous catheter tip in the SVC without pneumothorax Endotracheal tube in good position Extensive pneumonia left upper lobe and left lower lobe is unchanged. Electronically Signed   By: Franchot Gallo M.D.   On: 04/21/2016 08:00   Dg Chest Portable 1 View  Result Date: 04/21/2016 CLINICAL DATA:  Endotracheal tube placement. Patient's iodine ache with gasping respirations. EXAM: PORTABLE CHEST 1 VIEW COMPARISON:  01/27/2016 FINDINGS: Endotracheal tube placed with tip measuring 3.4 cm above the carina. Enteric tube tip is off the field of view but below the left hemidiaphragm. There is interval development since the previous study of diffuse patchy infiltration throughout the left lung most likely represent pneumonia. Small left pleural effusion. Right lung appears clear. IMPRESSION: Appliances appear in satisfactory position. Diffuse infiltration throughout the left lung with small effusion likely representing pneumonia. Electronically Signed   By: Lucienne Capers M.D.   On: 04/21/2016 06:39     CBC  Recent Labs Lab 04/21/16 0525 04/22/16 0407 04/24/16 0423  WBC 18.8* 37.5* 15.4*  HGB 16.1 13.3 12.4*  HCT 50.0 40.0 36.5*  PLT 324 267 175  MCV 85.9 85.0 82.6  MCH 27.7  28.2 28.1  MCHC 32.2 33.2 34.1  RDW 15.9* 16.1* 16.2*  LYMPHSABS 2.5  --   --   MONOABS 0.1*  --   --   EOSABS 0.1  --   --   BASOSABS 0.1  --   --     Chemistries   Recent Labs Lab 04/21/16 0525 04/22/16 0407 04/23/16 0441 04/24/16 0423 04/25/16 0420  NA 142 138 138 141 141  K 4.0 4.4 3.8 3.4* 3.8  CL 104 108 110 111 111  CO2 29 22 23 24 23   GLUCOSE 178* 264* 175* 143* 137*  BUN 15 24* 26* 31* 33*  CREATININE 1.87* 2.28* 1.83* 1.54* 1.48*  CALCIUM 8.7* 7.4* 8.1* 8.2* 8.4*  MG  --  1.5* 2.0 2.1 2.0  AST  23  --   --   --   --   ALT 16*  --   --   --   --   ALKPHOS 102  --   --   --   --   BILITOT 0.2*  --   --   --   --    ------------------------------------------------------------------------------------------------------------------ estimated creatinine clearance is 44.8 mL/min (by C-G formula based on SCr of 1.48 mg/dL (H)). ------------------------------------------------------------------------------------------------------------------ No results for input(s): HGBA1C in the last 72 hours. ------------------------------------------------------------------------------------------------------------------ No results for input(s): CHOL, HDL, LDLCALC, TRIG, CHOLHDL, LDLDIRECT in the last 72 hours. ------------------------------------------------------------------------------------------------------------------ No results for input(s): TSH, T4TOTAL, T3FREE, THYROIDAB in the last 72 hours.  Invalid input(s): FREET3 ------------------------------------------------------------------------------------------------------------------ No results for input(s): VITAMINB12, FOLATE, FERRITIN, TIBC, IRON, RETICCTPCT in the last 72 hours.  Coagulation profile  Recent Labs Lab 04/21/16 0525  INR 1.02    No results for input(s): DDIMER in the last 72 hours.  Cardiac Enzymes  Recent Labs Lab 04/22/16 1720 04/23/16 0441 04/23/16 1400  TROPONINI <0.03 0.03* 0.07*    ------------------------------------------------------------------------------------------------------------------ Invalid input(s): POCBNP    Assessment & Plan  Patient 75 year old admitted with acute respiratory failure due to pneumonia 1.   Acute respiratory failure with hypoxia (HCC) Due to combination of acute on chronic COPD exasperation as well as pneumonia Sputum cultures with Klebsiella pneumonia Continue therapy with IV Levaquin for total antibiotic duration of 7 days Patient's respiratory status improved his oxygen requirement back to baseline  2. Acute delirium Due to pneumonia as well as acute respiratory failure Patient continues to improve I will ask physical therapy to see him    3. Acute on chronic COPD (chronic obstructive pulmonary disease) (HCC) exasperation Continued therapy with Pulmicort, IV Solu-Medrol switched to oral prednisone tomorrow  4.   Chronic diastolic heart failure (HCC) currently compensated continue to monitor fluid status  5.   Chronic kidney disease (CKD), stage III (moderate) avoid nephrotoxins monitor renal   6. Atrial fibrillation with rapid ventricular rate continue Cardizem  7. Diabetes type 2 continue sliding scale insulin     Code Status Orders        Start     Ordered   04/21/16 0702  Full code  Continuous     04/21/16 0702    Code Status History    Date Active Date Inactive Code Status Order ID Comments User Context   04/21/2016  7:02 AM 04/21/2016  5:03 PM Full Code [id number]  Holley Raring, NP ED   01/04/2015  2:01 PM 01/07/2015  5:37 PM Full Code [id number]  Greggory Keen, MD Inpatient   01/03/2015  3:03 PM 01/04/2015  2:01 PM Full Code [id number]  Seeplaputhur Robinette Haines, MD Inpatient     Once patient is weaned off Precedex can be transferred to a regular floor with telemetry      Consults  Pulmonary   DVT Prophylaxis  Lovenox  Lab Results  Component Value Date   PLT 175 04/24/2016     Time Spent in minutes    20minutes Cyndia Bent M.D on 04/25/2016 at 1:23 PM  Between 7am to 6pm - Pager - [phone]  After 6pm go to [email or web address] - password [email or web address]   Office  [phone]

## 2016-04-25 NOTE — Progress Notes (Signed)
Patient observed with elevated HR with activity, patient remains asymptomatic, denies chest pain, sob. Will continue to monitor. MD paged awaiting returning call.

## 2016-04-25 NOTE — Progress Notes (Signed)
Patient received on 2A room 257, alert, able to make needs known. Patient noted with open area on right side of the neck, s/p right internal jugular catheter removal. Tele box verified. Patient stable at this time, will continue to monitor.

## 2016-04-26 LAB — BASIC METABOLIC PANEL
Anion gap: 9 (ref 5–15)
BUN: 35 mg/dL — AB (ref 6–20)
CHLORIDE: 107 mmol/L (ref 101–111)
CO2: 26 mmol/L (ref 22–32)
CREATININE: 1.53 mg/dL — AB (ref 0.61–1.24)
Calcium: 9 mg/dL (ref 8.9–10.3)
GFR calc Af Amer: 49 mL/min — ABNORMAL LOW (ref >60)
GFR calc non Af Amer: 42 mL/min — ABNORMAL LOW (ref >60)
GLUCOSE: 117 mg/dL — AB (ref 65–99)
Potassium: 4.7 mmol/L (ref 3.5–5.1)
SODIUM: 142 mmol/L (ref 135–145)

## 2016-04-26 LAB — CBC
HEMATOCRIT: 44 % (ref 40.0–52.0)
HEMOGLOBIN: 14.4 g/dL (ref 13.0–18.0)
MCH: 27.3 pg (ref 26.0–34.0)
MCHC: 32.6 g/dL (ref 32.0–36.0)
MCV: 83.6 fL (ref 80.0–100.0)
Platelets: 255 10*3/uL (ref 150–440)
RBC: 5.26 MIL/uL (ref 4.40–5.90)
RDW: 16.4 % — ABNORMAL HIGH (ref 11.5–14.5)
WBC: 20.4 10*3/uL — ABNORMAL HIGH (ref 3.8–10.6)

## 2016-04-26 LAB — PHOSPHORUS: Phosphorus: 3.7 mg/dL (ref 2.5–4.6)

## 2016-04-26 LAB — CULTURE, BLOOD (ROUTINE X 2)
CULTURE: NO GROWTH
CULTURE: NO GROWTH

## 2016-04-26 LAB — MAGNESIUM: Magnesium: 2.2 mg/dL (ref 1.7–2.4)

## 2016-04-26 LAB — GLUCOSE, CAPILLARY
GLUCOSE-CAPILLARY: 149 mg/dL — AB (ref 65–99)
Glucose-Capillary: 96 mg/dL (ref 65–99)
Glucose-Capillary: 160 mg/dL — ABNORMAL HIGH (ref 65–99)
Glucose-Capillary: 164 mg/dL — ABNORMAL HIGH (ref 65–99)

## 2016-04-26 MED ORDER — ASPIRIN 81 MG PO CHEW
81.0000 mg | CHEWABLE_TABLET | Freq: Every day | ORAL | Status: DC
  Administered 2016-04-26 – 2016-04-27 (×2): 81 mg via ORAL
  Filled 2016-04-26 (×2): qty 1

## 2016-04-26 MED ORDER — METOPROLOL SUCCINATE ER 50 MG PO TB24: 50.0000 mg | ORAL_TABLET | Freq: Every day | ORAL | Status: DC

## 2016-04-26 MED ORDER — DILTIAZEM HCL 60 MG PO TABS
60.0000 mg | ORAL_TABLET | Freq: Four times a day (QID) | ORAL | Status: DC
  Administered 2016-04-26 – 2016-04-27 (×4): 60 mg via ORAL
  Filled 2016-04-26 (×4): qty 1

## 2016-04-26 MED ORDER — TAMSULOSIN HCL 0.4 MG PO CAPS
0.4000 mg | ORAL_CAPSULE | Freq: Every day | ORAL | Status: DC
  Administered 2016-04-26 – 2016-04-27 (×2): 0.4 mg via ORAL
  Filled 2016-04-26 (×2): qty 1

## 2016-04-26 MED ORDER — DILTIAZEM HCL 60 MG PO TABS: 60.0000 mg | ORAL_TABLET | Freq: Three times a day (TID) | ORAL | Status: DC

## 2016-04-26 MED ORDER — MOMETASONE FURO-FORMOTEROL FUM 200-5 MCG/ACT IN AERO
2.0000 | INHALATION_SPRAY | Freq: Two times a day (BID) | RESPIRATORY_TRACT | Status: DC
  Administered 2016-04-26 – 2016-04-27 (×3): 2 via RESPIRATORY_TRACT
  Filled 2016-04-26: qty 8.8

## 2016-04-26 MED ORDER — FAMOTIDINE 20 MG PO TABS
20.0000 mg | ORAL_TABLET | Freq: Every day | ORAL | Status: DC
  Filled 2016-04-26: qty 1

## 2016-04-26 MED ORDER — PRAVASTATIN SODIUM 20 MG PO TABS
20.0000 mg | ORAL_TABLET | Freq: Every day | ORAL | Status: DC
  Administered 2016-04-26: 20 mg via ORAL
  Filled 2016-04-26: qty 1

## 2016-04-26 MED ORDER — METOPROLOL SUCCINATE ER 100 MG PO TB24
100.0000 mg | ORAL_TABLET | Freq: Every day | ORAL | Status: DC
  Administered 2016-04-26 – 2016-04-27 (×2): 100 mg via ORAL
  Filled 2016-04-26 (×2): qty 1

## 2016-04-26 MED ORDER — GABAPENTIN 300 MG PO CAPS
300.0000 mg | ORAL_CAPSULE | Freq: Two times a day (BID) | ORAL | Status: DC
  Administered 2016-04-26 – 2016-04-27 (×3): 300 mg via ORAL
  Filled 2016-04-26 (×3): qty 1

## 2016-04-26 MED ORDER — GI COCKTAIL ~~LOC~~: 30.0000 mL | Freq: Four times a day (QID) | ORAL | Status: DC | PRN

## 2016-04-26 NOTE — Care Management Important Message (Signed)
Important Message  Patient Details  Name: Bobby Peterson MRN: [id number] Date of Birth: 1941-01-25   Medicare Important Message Given:  Yes    Katrina Stack, RN 04/26/2016, 4:17 PM

## 2016-04-26 NOTE — Progress Notes (Signed)
[place] consulted for electrolyte and constipation management in this 89 yoM admitted with respiratory failure and PNA.  1. Electrolytes: 9/26 am labs: K = 4.7; Ca = 9; Phos = 3.7; mag = 2.2. No need for replacement today. Will f/u with am labs. If AM labs are WNL will sign off.  2. Constipation prevention: BM on 9/24. Continue senna/docuate. Continue montioring  3.  Abx: Pt on day 4 of levofloxacin. Day 6 of total antibiotic days. Stop date is 9/27  Allergies  Allergen Reactions  . Fenofibrate     Other reaction(s): Headache  . Lisinopril Cough  . Niacin     Other reaction(s): Dizziness  . Phenergan [Promethazine Hcl]     NVD  . Simvastatin     Other reaction(s): Headache    Patient Measurements: Height: 5\' 7"  (170.2 cm) Weight: 177 lb 12.8 oz (80.6 kg) IBW/kg (Calculated) : 66.1   Vital Signs: Temp: 98.4 F (36.9 C) (09/26 0426) Temp Source: Oral (09/26 0426) BP: 174/110 (09/26 0559) Pulse Rate: 128 (09/26 0559) Intake/Output from previous day: 09/25 0701 - 09/26 0700 In: 81 [I.V.:81] Out: 1675 [Urine:1675] Intake/Output from this shift: No intake/output data recorded.  Labs:  Recent Labs  04/24/16 0423 04/26/16 0633  WBC 15.4* 20.4*  HGB 12.4* 14.4  HCT 36.5* 44.0  PLT 175 255     Recent Labs  04/24/16 0423 04/25/16 0420 04/26/16 0633  NA 141 141 142  K 3.4* 3.8 4.7  CL 111 111 107  CO2 24 23 26   GLUCOSE 143* 137* 117*  BUN 31* 33* 35*  CREATININE 1.54* 1.48* 1.53*  CALCIUM 8.2* 8.4* 9.0  MG 2.1 2.0 2.2  PHOS 1.7* 3.2 3.7   Estimated Creatinine Clearance: 41.1 mL/min (by C-G formula based on SCr of 1.53 mg/dL (H)).    Recent Labs  04/25/16 1333 04/25/16 1729 04/25/16 2027  GLUCAP 106* 96 130*    Medications:  Scheduled:  . budesonide (PULMICORT) nebulizer solution  0.5 mg Nebulization BID  . Chlorhexidine Gluconate Cloth  6 each Topical Q0600  . enoxaparin (LOVENOX) injection  40 mg Subcutaneous Q24H  .  famotidine (PEPCID) IV  20 mg Intravenous Q24H  . insulin aspart  0-15 Units Subcutaneous TID AC & HS  . ipratropium-albuterol  3 mL Nebulization Q6H  . levofloxacin (LEVAQUIN) IV  250 mg Intravenous Q24H  . mouth rinse  15 mL Mouth Rinse BID  . methylPREDNISolone (SOLU-MEDROL) injection  30 mg Intravenous Daily   Followed by  . [START ON 04/29/2016] methylPREDNISolone (SOLU-MEDROL) injection  20 mg Intravenous Daily   Followed by  . [START ON 05/02/2016] methylPREDNISolone (SOLU-MEDROL) injection  10 mg Intravenous Daily  . metoprolol tartrate  50 mg Oral BID  . mupirocin ointment  1 application Nasal BID  . nicotine  14 mg Transdermal Daily  . QUEtiapine  25 mg Oral QHS  . senna-docusate  1 tablet Oral BID  . sodium chloride flush  10-40 mL Intracatheter Q12H   Infusions:     Ramond Dial, Pharm.D Clinical Pharmacist  04/26/2016 7:42 AM

## 2016-04-26 NOTE — Progress Notes (Signed)
Key Points: Use following P&T approved IV to PO antibiotic change policy.  Description contains the criteria that are approved Note: Policy Excludes:  Esophagectomy patientsPHARMACIST - PHYSICIAN COMMUNICATION DR:   Posey Pronto CONCERNING: IV to Oral Route Change Policy  RECOMMENDATION: This patient is receiving famotidine by the intravenous route.  Based on criteria approved by the Pharmacy and Therapeutics Committee, the intravenous medication(s) is/are being converted to the equivalent oral dose form(s).   DESCRIPTION: These criteria include:  The patient is eating (either orally or via tube) and/or has been taking other orally administered medications for a least 24 hours  The patient has no evidence of active gastrointestinal bleeding or impaired GI absorption (gastrectomy, short bowel, patient on TNA or NPO).  If you have questions about this conversion, please contact the Pharmacy Department  []   [phone] )  [facility] [x]   [phone] )  [facility] []   [phone] )  [facility] []   [phone] )  [facility] []   [phone] )  [place], [facility] 04/26/2016 12:12 PM

## 2016-04-26 NOTE — Care Management (Signed)
Patient has chronic home 02 and verbalizes understanding of need to have protable tank for transport home.  He and his daughter are not so sure about having home health services because 'last time they sat there and told my wife what to do."  Discussed that home health physical therapy is about teaching patient and caregivers how to carry out home exercise plan.  They are not sure about having home health- may consider the nurse but will let cm known 9/27.  Patient will speak to his wife about the service.  Currently wife is at home resting. Patient says he has been told he will discharge tomorrow

## 2016-04-26 NOTE — Progress Notes (Signed)
[place] at [facility]                                                                                                                                                                                            Patient Demographics   Bobby Peterson, is a 75 y.o. male, DOB - 07/30/1941, [id number]  Admit date - 04/21/2016   Admitting Physician Vilinda Boehringer, MD  Outpatient Primary MD for the patient is Lelon Huh, MD   LOS - 5  Subjective: Patient doing better wants to go homeHe wanted to chew tobacco last night Since just he's had heart rates elevated in the 130s. With activity   Review of Systems:   CONSTITUTIONAL:Still some confusionBut much better  Vitals:   Vitals:   04/26/16 0215 04/26/16 0426 04/26/16 0559 04/26/16 0756  BP:  (!) 172/116 (!) 174/110 (!) 153/82  Pulse:  (!) 134 (!) 128 77  Resp:  18    Temp:  98.4 F (36.9 C)  98.1 F (36.7 C)  TempSrc:  Oral  Oral  SpO2: 94% 93%  95%  Weight:   177 lb 12.8 oz (80.6 kg)   Height:        Wt Readings from Last 3 Encounters:  04/26/16 177 lb 12.8 oz (80.6 kg)  02/12/16 190 lb (86.2 kg)  01/08/16 191 lb (86.6 kg)     Intake/Output Summary (Last 24 hours) at 04/26/16 1213 Last data filed at 04/25/16 1930  Gross per 24 hour  Intake                0 ml  Output              900 ml  Net             -900 ml    Physical Exam:   GENERAL:In no acute distress now HEAD, EYES, EARS, NOSE AND THROAT: Atraumatic, normocephalic.  Pupils equal and reactive to light. Sclerae anicteric. No conjunctival injection. No oro-pharyngeal erythema.  NECK: Supple. There is no jugular venous distention. No bruits, no lymphadenopathy, no thyromegaly.  HEART: Regular rate and rhythm,. No murmurs, no rubs, no clicks.  LUNGS: Diminished breath sounds without any necessary muscle usage  ABDOMEN: Soft, flat, nontender, nondistended. Has good bowel sounds. No hepatosplenomegaly appreciated.   EXTREMITIES: No evidence of any cyanosis, clubbing, or peripheral edema.  +2 pedal and radial pulses bilaterally.  NEUROLOGIC: Awake and not oriented to place or time SKIN: Moist and warm with no rashes appreciated.  Psych: Sedated LN: No inguinal LN enlargement    Antibiotics   Anti-infectives  Start     Dose/Rate Route Frequency Ordered Stop   04/24/16 1800  Levofloxacin (LEVAQUIN) IVPB 250 mg     250 mg 50 mL/hr over 60 Minutes Intravenous Every 24 hours 04/23/16 1332 04/27/16 2359   04/23/16 1800  levofloxacin (LEVAQUIN) IVPB 500 mg     500 mg 100 mL/hr over 60 Minutes Intravenous  Once 04/23/16 1332 04/23/16 1933   04/23/16 1330  levofloxacin (LEVAQUIN) IVPB 500 mg  Status:  Discontinued     500 mg 100 mL/hr over 60 Minutes Intravenous Every 24 hours 04/23/16 1317 04/23/16 1332   04/21/16 1900  vancomycin (VANCOCIN) IVPB 1000 mg/200 mL premix  Status:  Discontinued     1,000 mg 200 mL/hr over 60 Minutes Intravenous Every 24 hours 04/21/16 1652 04/23/16 1317   04/21/16 1400  piperacillin-tazobactam (ZOSYN) IVPB 3.375 g  Status:  Discontinued     3.375 g 12.5 mL/hr over 240 Minutes Intravenous Every 8 hours 04/21/16 0848 04/23/16 1317   04/21/16 1130  azithromycin (ZITHROMAX) 500 mg in dextrose 5 % 250 mL IVPB  Status:  Discontinued     500 mg 250 mL/hr over 60 Minutes Intravenous Every 24 hours 04/21/16 1021 04/23/16 1332   04/21/16 0715  piperacillin-tazobactam (ZOSYN) IVPB 3.375 g  Status:  Discontinued     3.375 g 12.5 mL/hr over 240 Minutes Intravenous Every 6 hours 04/21/16 0709 04/21/16 0847   04/21/16 0600  vancomycin (VANCOCIN) IVPB 1000 mg/200 mL premix     1,000 mg 200 mL/hr over 60 Minutes Intravenous  Once 04/21/16 0554 04/21/16 0658   04/21/16 0600  piperacillin-tazobactam (ZOSYN) IVPB 3.375 g     3.375 g 100 mL/hr over 30 Minutes Intravenous  Once 04/21/16 0554 04/21/16 0651      Medications   Scheduled Meds: . aspirin  81 mg Oral Daily  . budesonide  (PULMICORT) nebulizer solution  0.5 mg Nebulization BID  . Chlorhexidine Gluconate Cloth  6 each Topical Q0600  . diltiazem  60 mg Oral Q6H  . enoxaparin (LOVENOX) injection  40 mg Subcutaneous Q24H  . [START ON 04/27/2016] famotidine  20 mg Oral Daily  . gabapentin  300 mg Oral BID  . insulin aspart  0-15 Units Subcutaneous TID AC & HS  . ipratropium-albuterol  3 mL Nebulization Q6H  . levofloxacin (LEVAQUIN) IV  250 mg Intravenous Q24H  . mouth rinse  15 mL Mouth Rinse BID  . methylPREDNISolone (SOLU-MEDROL) injection  30 mg Intravenous Daily   Followed by  . [START ON 04/29/2016] methylPREDNISolone (SOLU-MEDROL) injection  20 mg Intravenous Daily   Followed by  . [START ON 05/02/2016] methylPREDNISolone (SOLU-MEDROL) injection  10 mg Intravenous Daily  . metoprolol succinate  100 mg Oral Daily  . mometasone-formoterol  2 puff Inhalation BID  . mupirocin ointment  1 application Nasal BID  . nicotine  14 mg Transdermal Daily  . pravastatin  20 mg Oral q1800  . QUEtiapine  25 mg Oral QHS  . senna-docusate  1 tablet Oral BID  . sodium chloride flush  10-40 mL Intracatheter Q12H  . tamsulosin  0.4 mg Oral Daily   Continuous Infusions:   PRN Meds:.sodium chloride, diazepam, haloperidol lactate, ipratropium-albuterol, sodium chloride flush   Data Review:   Micro Results Recent Results (from the past 240 hour(s))  Blood culture (routine x 2)     Status: None   Collection Time: 04/21/16  5:25 AM  Result Value Ref Range Status   Specimen Description BLOOD LEFT WRIST  Final  Special Requests   Final    BOTTLES DRAWN AEROBIC AND ANAEROBIC AER 9ML ANA 6ML   Culture NO GROWTH 5 DAYS  Final   Report Status 04/26/2016 FINAL  Final  Blood culture (routine x 2)     Status: None   Collection Time: 04/21/16  5:25 AM  Result Value Ref Range Status   Specimen Description BLOOD RIGHT FORE ARM  Final   Special Requests   Final    BOTTLES DRAWN AEROBIC AND ANAEROBIC AER 4ML ANA 3ML   Culture  NO GROWTH 5 DAYS  Final   Report Status 04/26/2016 FINAL  Final  Urine culture     Status: None   Collection Time: 04/21/16  6:30 AM  Result Value Ref Range Status   Specimen Description URINE, RANDOM  Final   Special Requests NONE  Final   Culture NO GROWTH Performed at [facility]   Final   Report Status 04/22/2016 FINAL  Final  Rapid Influenza A&B Antigens ([place] only)     Status: None   Collection Time: 04/21/16  6:34 AM  Result Value Ref Range Status   Influenza A ([facility]) NEGATIVE NEGATIVE Final   Influenza B ([facility]) NEGATIVE NEGATIVE Final  MRSA PCR Screening     Status: Abnormal   Collection Time: 04/21/16  8:42 AM  Result Value Ref Range Status   MRSA by PCR POSITIVE (A) NEGATIVE Final    Comment:        The GeneXpert MRSA Assay (FDA approved for NASAL specimens only), is one component of a comprehensive MRSA colonization surveillance program. It is not intended to diagnose MRSA infection nor to guide or monitor treatment for MRSA infections. RESULT CALLED TO, READ BACK BY AND VERIFIED WITH: NIA DILE 04/21/16 1051 SGD   Culture, respiratory (NON-Expectorated)     Status: None   Collection Time: 04/21/16 12:01 PM  Result Value Ref Range Status   Specimen Description TRACHEAL ASPIRATE  Final   Special Requests NONE  Final   Gram Stain   Final    ABUNDANT WBC PRESENT,BOTH PMN AND MONONUCLEAR RARE GRAM POSITIVE COCCI IN PAIRS RARE GRAM NEGATIVE RODS RARE YEAST Performed at [place]  Final   Report Status 04/23/2016 FINAL  Final   Organism ID, Bacteria KLEBSIELLA PNEUMONIAE  Final      Susceptibility   Klebsiella pneumoniae - MIC*    AMPICILLIN >=32 RESISTANT Resistant     CEFAZOLIN <=4 SENSITIVE Sensitive     CEFEPIME <=1 SENSITIVE Sensitive     CEFTAZIDIME <=1 SENSITIVE Sensitive     CEFTRIAXONE <=1 SENSITIVE Sensitive     CIPROFLOXACIN <=0.25 SENSITIVE Sensitive     GENTAMICIN <=1 SENSITIVE Sensitive      IMIPENEM <=0.25 SENSITIVE Sensitive     TRIMETH/SULFA <=20 SENSITIVE Sensitive     AMPICILLIN/SULBACTAM 4 SENSITIVE Sensitive     PIP/TAZO <=4 SENSITIVE Sensitive     Extended ESBL NEGATIVE Sensitive     * FEW KLEBSIELLA PNEUMONIAE    Radiology Reports Dg Chest 1 View  Result Date: 04/24/2016 CLINICAL DATA:  Pneumonia. EXAM: CHEST 1 VIEW COMPARISON:  04/21/2016. FINDINGS: Right jugular catheter tip in the superior vena cava. The endotracheal and nasogastric tubes have been removed. The heart remains grossly normal in size. Mild increase in diffuse prominence of the interstitial markings. No significant change in airspace opacity throughout the left lung. Interval small left pleural effusion. Diffuse osteopenia. IMPRESSION: 1. Stable left lung pneumonia. 2.  Interval small left pleural effusion. 3. Interval mild interstitial pulmonary edema. Electronically Signed   By: Claudie Revering M.D.   On: 04/24/2016 08:03   Dg Abdomen 1 View  Result Date: 04/21/2016 CLINICAL DATA:  Enteric tube placement. EXAM: ABDOMEN - 1 VIEW COMPARISON:  CT abdomen and pelvis 01/12/2015 FINDINGS: An enteric tube has been placed with tip in the left upper quadrant consistent with location in the upper stomach. Multiple superimposed lines and wires. These are likely extrinsic. Surgical clips in the right upper quadrant. Visualized bowel gas pattern is unremarkable. Left pleural effusion with left pulmonary infiltrates. IMPRESSION: Enteric tube tip localizes to the left upper quadrant consistent with location in the upper stomach. Electronically Signed   By: Lucienne Capers M.D.   On: 04/21/2016 06:41   Dg Chest Port 1 View  Result Date: 04/25/2016 CLINICAL DATA:  74 year old male with central line placement EXAM: PORTABLE CHEST 1 VIEW COMPARISON:  Chest radiograph dated 04/24/2016 FINDINGS: Right IJ central line with tip over central SVC is stable positioning. There is no pneumothorax. Diffuse left lung interstitial and  nodular opacities as well as small left pleural effusion similar to prior radiograph. Right lung base atelectatic changes versus infiltrate. There is stable cardiac silhouette. No acute osseous pathology. IMPRESSION: Right IJ central line with tip over central SVC in stable positioning. No pneumothorax. Stable appearance of the left lung infiltrate and left pleural effusion. Follow-up recommended. Electronically Signed   By: Anner Crete M.D.   On: 04/25/2016 04:18   Dg Chest Portable 1 View  Result Date: 04/21/2016 CLINICAL DATA:  Central line placement EXAM: PORTABLE CHEST 1 VIEW COMPARISON:  04/21/2016 FINDINGS: Endotracheal tube remains in good position. Right jugular central venous catheter tip in the mid SVC without pneumothorax. NG tube enters the stomach Extensive infiltrate in the left upper lobe and left lower lobe is unchanged and compatible with pneumonia. Small left effusion. Right lung remains clear with minimal right lower lobe atelectasis IMPRESSION: Central venous catheter tip in the SVC without pneumothorax Endotracheal tube in good position Extensive pneumonia left upper lobe and left lower lobe is unchanged. Electronically Signed   By: Franchot Gallo M.D.   On: 04/21/2016 08:00   Dg Chest Portable 1 View  Result Date: 04/21/2016 CLINICAL DATA:  Endotracheal tube placement. Patient's iodine ache with gasping respirations. EXAM: PORTABLE CHEST 1 VIEW COMPARISON:  01/27/2016 FINDINGS: Endotracheal tube placed with tip measuring 3.4 cm above the carina. Enteric tube tip is off the field of view but below the left hemidiaphragm. There is interval development since the previous study of diffuse patchy infiltration throughout the left lung most likely represent pneumonia. Small left pleural effusion. Right lung appears clear. IMPRESSION: Appliances appear in satisfactory position. Diffuse infiltration throughout the left lung with small effusion likely representing pneumonia. Electronically  Signed   By: Lucienne Capers M.D.   On: 04/21/2016 06:39     CBC  Recent Labs Lab 04/21/16 0525 04/22/16 0407 04/24/16 0423 04/26/16 0633  WBC 18.8* 37.5* 15.4* 20.4*  HGB 16.1 13.3 12.4* 14.4  HCT 50.0 40.0 36.5* 44.0  PLT 324 267 175 255  MCV 85.9 85.0 82.6 83.6  MCH 27.7 28.2 28.1 27.3  MCHC 32.2 33.2 34.1 32.6  RDW 15.9* 16.1* 16.2* 16.4*  LYMPHSABS 2.5  --   --   --   MONOABS 0.1*  --   --   --   EOSABS 0.1  --   --   --   BASOSABS 0.1  --   --   --  Chemistries   Recent Labs Lab 04/21/16 0525 04/22/16 0407 04/23/16 0441 04/24/16 0423 04/25/16 0420 04/26/16 0633  NA 142 138 138 141 141 142  K 4.0 4.4 3.8 3.4* 3.8 4.7  CL 104 108 110 111 111 107  CO2 29 22 23 24 23 26   GLUCOSE 178* 264* 175* 143* 137* 117*  BUN 15 24* 26* 31* 33* 35*  CREATININE 1.87* 2.28* 1.83* 1.54* 1.48* 1.53*  CALCIUM 8.7* 7.4* 8.1* 8.2* 8.4* 9.0  MG  --  1.5* 2.0 2.1 2.0 2.2  AST 23  --   --   --   --   --   ALT 16*  --   --   --   --   --   ALKPHOS 102  --   --   --   --   --   BILITOT 0.2*  --   --   --   --   --    ------------------------------------------------------------------------------------------------------------------ estimated creatinine clearance is 41.1 mL/min (by C-G formula based on SCr of 1.53 mg/dL (H)). ------------------------------------------------------------------------------------------------------------------ No results for input(s): HGBA1C in the last 72 hours. ------------------------------------------------------------------------------------------------------------------ No results for input(s): CHOL, HDL, LDLCALC, TRIG, CHOLHDL, LDLDIRECT in the last 72 hours. ------------------------------------------------------------------------------------------------------------------ No results for input(s): TSH, T4TOTAL, T3FREE, THYROIDAB in the last 72 hours.  Invalid input(s):  FREET3 ------------------------------------------------------------------------------------------------------------------ No results for input(s): VITAMINB12, FOLATE, FERRITIN, TIBC, IRON, RETICCTPCT in the last 72 hours.  Coagulation profile  Recent Labs Lab 04/21/16 0525  INR 1.02    No results for input(s): DDIMER in the last 72 hours.  Cardiac Enzymes  Recent Labs Lab 04/22/16 1720 04/23/16 0441 04/23/16 1400  TROPONINI <0.03 0.03* 0.07*   ------------------------------------------------------------------------------------------------------------------ Invalid input(s): POCBNP    Assessment & Plan  Patient 75 year old admitted with acute respiratory failure due to pneumonia 1.   Acute respiratory failure with hypoxia (HCC) Due to combination of acute on chronic COPD exasperation as well as pneumonia Sputum cultures with Klebsiella pneumonia Continue therapy with IV Levaquin for total antibiotic duration of 7 days Patient's respiratory status improved his oxygen requirement back to baseline Wean steroids  2. Acute delirium Due to pneumonia as well as acute respiratory failure Patient continues to improve I will ask physical therapy to see him   3. Paroxysmal atrial tachycardia Continue Toprol cardiology will be increasing his dose I will add Cardizem to current regimen to control his heart rate 4.   Chronic diastolic heart failure (HCC) currently compensated continue to monitor fluid status  5.   Chronic kidney disease (CKD), stage III (moderate) avoid nephrotoxins monitor renal    6. Diabetes type 2 continue sliding scale insulin Blood sugar stable    Code Status Orders        Start     Ordered   04/21/16 0702  Full code  Continuous     04/21/16 0702    Code Status History    Date Active Date Inactive Code Status Order ID Comments User Context   04/21/2016  7:02 AM 04/21/2016  5:03 PM Full Code [id number]  Holley Raring, NP ED   01/04/2015  2:01 PM  01/07/2015  5:37 PM Full Code [id number]  Greggory Keen, MD Inpatient   01/03/2015  3:03 PM 01/04/2015  2:01 PM Full Code [id number]  Seeplaputhur Robinette Haines, MD Inpatient     Once patient is weaned off Precedex can be transferred to a regular floor with telemetry      Consults  Pulmonary   DVT Prophylaxis  Lovenox  Lab Results  Component Value Date   PLT 255 04/26/2016     Time Spent in minutes  9minutes Spent Dustin Flock M.D on 04/26/2016 at 12:13 PM  Between 7am to 6pm - Pager - [phone]  After 6pm go to [email or web address] - password [email or web address] [place] [facility]   Office  [phone]

## 2016-04-26 NOTE — Progress Notes (Signed)
Patient: Bobby Peterson / Admit Date: 04/21/2016 / Date of Encounter: 04/26/2016, 8:53 AM   Subjective: No complaints morning, Sitting up in a chair ready to eat breakfast Poor historian, some confusion Sputum cultures with Klebsiella pneumonia, on Levaquin Telemetry reviewed in detail showing worsening of tachycardia since diltiazem infusion held and changed to metoprolol He reports having mild leg edema   Review of Systems: Review of Systems  Respiratory: Positive for cough and shortness of breath.   Cardiovascular: Negative.   Gastrointestinal: Negative.   Musculoskeletal: Negative.   Neurological: Positive for weakness.  Psychiatric/Behavioral: Negative.   All other systems reviewed and are negative.   Objective: Telemetry:  Physical Exam: Blood pressure (!) 153/82, pulse 77 to 140s, temperature 98.1 F (36.7 C), temperature source Oral, resp. rate 18, height 5\' 7"  (1.702 m), weight 177 lb 12.8 oz (80.6 kg), SpO2 95 %. Body mass index is 27.85 kg/m. General: Well developed, well nourished, in no acute distress. sitting up in a chair, eating his breakfast  Head: Normocephalic, atraumatic, sclera non-icteric Neck: Negative for carotid bruits. JVP not elevated. Lungs: Cle decreased breath sounds throughout bilaterally, breathing unlabored Cardiac : Irregular rhythm, tachycardic,S1 S2 without murmurs, rubs, or gallops.  Abdomen: Soft, non-tender, non-distended with normoactive bowel sounds. No rebound/guarding. Extremities: No clubbing or cyanosis. No edema. Distal pedal pulses are 2+ and equal bilaterally. Neuro: Alert and oriented X 3. Moves all extremities spontaneously. Psych:  Responds to questions appropriately with , some confusion    Intake/Output Summary (Last 24 hours) at 04/26/16 0853 Last data filed at 04/25/16 1930  Gross per 24 hour  Intake                0 ml  Output             1325 ml  Net            -1325 ml    Inpatient Medications:  . aspirin   81 mg Oral Daily  . budesonide (PULMICORT) nebulizer solution  0.5 mg Nebulization BID  . Chlorhexidine Gluconate Cloth  6 each Topical Q0600  . diltiazem  60 mg Oral Q6H  . enoxaparin (LOVENOX) injection  40 mg Subcutaneous Q24H  . famotidine (PEPCID) IV  20 mg Intravenous Q24H  . gabapentin  300 mg Oral BID  . insulin aspart  0-15 Units Subcutaneous TID AC & HS  . ipratropium-albuterol  3 mL Nebulization Q6H  . levofloxacin (LEVAQUIN) IV  250 mg Intravenous Q24H  . mouth rinse  15 mL Mouth Rinse BID  . methylPREDNISolone (SOLU-MEDROL) injection  30 mg Intravenous Daily   Followed by  . [START ON 04/29/2016] methylPREDNISolone (SOLU-MEDROL) injection  20 mg Intravenous Daily   Followed by  . [START ON 05/02/2016] methylPREDNISolone (SOLU-MEDROL) injection  10 mg Intravenous Daily  . metoprolol succinate  100 mg Oral Daily  . mometasone-formoterol  2 puff Inhalation BID  . mupirocin ointment  1 application Nasal BID  . nicotine  14 mg Transdermal Daily  . pravastatin  20 mg Oral q1800  . QUEtiapine  25 mg Oral QHS  . senna-docusate  1 tablet Oral BID  . sodium chloride flush  10-40 mL Intracatheter Q12H  . tamsulosin  0.4 mg Oral Daily   Infusions:    Labs:  Recent Labs  04/25/16 0420 04/26/16 0633  NA 141 142  K 3.8 4.7  CL 111 107  CO2 23 26  GLUCOSE 137* 117*  BUN 33* 35*  CREATININE  1.48* 1.53*  CALCIUM 8.4* 9.0  MG 2.0 2.2  PHOS 3.2 3.7   No results for input(s): AST, ALT, ALKPHOS, BILITOT, PROT, ALBUMIN in the last 72 hours.  Recent Labs  04/24/16 0423 04/26/16 0633  WBC 15.4* 20.4*  HGB 12.4* 14.4  HCT 36.5* 44.0  MCV 82.6 83.6  PLT 175 255    Recent Labs  04/23/16 1400  TROPONINI 0.07*   Invalid input(s): POCBNP No results for input(s): HGBA1C in the last 72 hours.   Weights: Filed Weights   04/24/16 0600 04/25/16 0700 04/26/16 0559  Weight: 194 lb 14.2 oz (88.4 kg) 198 lb 10.2 oz (90.1 kg) 177 lb 12.8 oz (80.6 kg)     Radiology/Studies:    Dg Chest 1 View  Result Date: 04/24/2016 CLINICAL DATA:  Pneumonia. EXAM: CHEST 1 VIEW COMPARISON:  04/21/2016. FINDINGS: Right jugular catheter tip in the superior vena cava. The endotracheal and nasogastric tubes have been removed. The heart remains grossly normal in size. Mild increase in diffuse prominence of the interstitial markings. No significant change in airspace opacity throughout the left lung. Interval small left pleural effusion. Diffuse osteopenia. IMPRESSION: 1. Stable left lung pneumonia. 2. Interval small left pleural effusion. 3. Interval mild interstitial pulmonary edema. Electronically Signed   By: Claudie Revering M.D.   On: 04/24/2016 08:03   Dg Abdomen 1 View  Result Date: 04/21/2016 CLINICAL DATA:  Enteric tube placement. EXAM: ABDOMEN - 1 VIEW COMPARISON:  CT abdomen and pelvis 01/12/2015 FINDINGS: An enteric tube has been placed with tip in the left upper quadrant consistent with location in the upper stomach. Multiple superimposed lines and wires. These are likely extrinsic. Surgical clips in the right upper quadrant. Visualized bowel gas pattern is unremarkable. Left pleural effusion with left pulmonary infiltrates. IMPRESSION: Enteric tube tip localizes to the left upper quadrant consistent with location in the upper stomach. Electronically Signed   By: Lucienne Capers M.D.   On: 04/21/2016 06:41   Dg Chest Port 1 View  Result Date: 04/25/2016 CLINICAL DATA:  75 year old male with central line placement EXAM: PORTABLE CHEST 1 VIEW COMPARISON:  Chest radiograph dated 04/24/2016 FINDINGS: Right IJ central line with tip over central SVC is stable positioning. There is no pneumothorax. Diffuse left lung interstitial and nodular opacities as well as small left pleural effusion similar to prior radiograph. Right lung base atelectatic changes versus infiltrate. There is stable cardiac silhouette. No acute osseous pathology. IMPRESSION: Right IJ central line with tip over central SVC  in stable positioning. No pneumothorax. Stable appearance of the left lung infiltrate and left pleural effusion. Follow-up recommended. Electronically Signed   By: Anner Crete M.D.   On: 04/25/2016 04:18   Dg Chest Portable 1 View  Result Date: 04/21/2016 CLINICAL DATA:  Central line placement EXAM: PORTABLE CHEST 1 VIEW COMPARISON:  04/21/2016 FINDINGS: Endotracheal tube remains in good position. Right jugular central venous catheter tip in the mid SVC without pneumothorax. NG tube enters the stomach Extensive infiltrate in the left upper lobe and left lower lobe is unchanged and compatible with pneumonia. Small left effusion. Right lung remains clear with minimal right lower lobe atelectasis IMPRESSION: Central venous catheter tip in the SVC without pneumothorax Endotracheal tube in good position Extensive pneumonia left upper lobe and left lower lobe is unchanged. Electronically Signed   By: Franchot Gallo M.D.   On: 04/21/2016 08:00   Dg Chest Portable 1 View  Result Date: 04/21/2016 CLINICAL DATA:  Endotracheal tube placement. Patient's iodine  ache with gasping respirations. EXAM: PORTABLE CHEST 1 VIEW COMPARISON:  01/27/2016 FINDINGS: Endotracheal tube placed with tip measuring 3.4 cm above the carina. Enteric tube tip is off the field of view but below the left hemidiaphragm. There is interval development since the previous study of diffuse patchy infiltration throughout the left lung most likely represent pneumonia. Small left pleural effusion. Right lung appears clear. IMPRESSION: Appliances appear in satisfactory position. Diffuse infiltration throughout the left lung with small effusion likely representing pneumonia. Electronically Signed   By: Lucienne Capers M.D.   On: 04/21/2016 06:39    Telemetry:  Multifocal atrial tachycardia rate 100s-140s.    ASSESSMENT AND PLAN:  Principal Problem:   Acute respiratory failure with hypoxia (HCC) Active Problems:   COPD (chronic obstructive  pulmonary disease) (HCC)   Aspiration pneumonia (HCC)   Chronic diastolic heart failure (HCC)   Chronic kidney disease (CKD), stage III (moderate)   Septic shock (HCC)   Atrial tachycardia (HCC)   Sepsis (HCC)   Left lower lobe pneumonia   # Multifocal atrial tachycardia:   secondary to his underlying lung disease and hypoxic respiratory failure.   Improving delirious and agitated state BNP was 41 on admission, LVEF 60-65% on echo  Discussed with hospitalist service-- Diltiazem 60 mg every 6 started   we'll increase metoprolol succinate up to 100 mg daily If rate continues to run high, will slowly advance calcium channel blocker or beta blocker  # Delirium:   delirious and anxious state is improving. Recently in the ICU, acutely ill, and on steroids.  Family is at bedside to reorient.    # Respiratory distress   sputum growing Klebsiella pneumonia, on broad-spectrum antibiotics    the above discussed with family at the bedside  Total encounter time more than 35 minutes  Greater than 50% was spent in counseling and coordination of care with the patient   Signed, Esmond Plants, MD, Ph.D [facility]

## 2016-04-26 NOTE — Progress Notes (Signed)
Patient and family refusing chair alarm at this time. Aware that they should call for assistance before letting the patient get up. Educated on safety. Will continue to monitor.

## 2016-04-26 NOTE — Progress Notes (Signed)
Patient heart rate significantly increases with any activity. Christell Faith, PA aware - rounding now.

## 2016-04-26 NOTE — Evaluation (Signed)
Physical Therapy Evaluation Patient Details Name: Bobby Peterson MRN: [id number] DOB: 1941-03-24 Today's Date: 04/26/2016   History of Present Illness  Pt is a 75 yo M with copd and left sided PNA.  In ARF, mechanically ventilated; now extubated and doing well on 2 L nasal cannula of O2; course complicated by delirium/sundowning requiring precedex, improving.   Clinical Impression  Pt presents with deficits in strength, gait, mobility, balance, and activity tolerance.  Pt SBA with bed mobility with extra time for tasks and SBA with transfers.  Pt able to amb 30' with RW and CGA with SpO2 remaining 91-93% on 2LO2/min.  Pt reports chronic use of 2LO2/min at home but typically at night with PRN use during the day.  Pt will benefit from continued PT services to address above deficits for decreased caregiver assistance and decreased fall risk upon discharge.      Follow Up Recommendations Home health PT    Equipment Recommendations  Rolling walker with 5" wheels    Recommendations for Other Services       Precautions / Restrictions Precautions Precautions: Fall Restrictions Weight Bearing Restrictions: No      Mobility  Bed Mobility Overal bed mobility: Needs Assistance Bed Mobility: Supine to Sit;Sit to Supine     Supine to sit: Supervision Sit to supine: Supervision   General bed mobility comments: Increased time for tasks  Transfers Overall transfer level: Needs assistance Equipment used: Rolling walker (2 wheeled) Transfers: Sit to/from Stand Sit to Stand: Supervision            Ambulation/Gait Ambulation/Gait assistance: Min guard Ambulation Distance (Feet): 30 Feet Assistive device: Rolling walker (2 wheeled) Gait Pattern/deviations: Trunk flexed;Decreased step length - right;Decreased step length - left   Gait velocity interpretation: Below normal speed for age/gender General Gait Details: Slow cadence  Stairs            Wheelchair Mobility     Modified Rankin (Stroke Patients Only)       Balance Overall balance assessment: Needs assistance   Sitting balance-Leahy Scale: Good     Standing balance support: No upper extremity supported Standing balance-Leahy Scale: Fair                               Pertinent Vitals/Pain Pain Assessment: No/denies pain    Home Living Family/patient expects to be discharged to:: Private residence Living Arrangements: Spouse/significant other Available Help at Discharge: Family Type of Home: House Home Access: Ramped entrance     Home Layout: One level Home Equipment: Walker - standard;Cane - single point      Prior Function Level of Independence: Independent         Comments: Ind with amb without AD limited community distances, ind with ADLs     Hand Dominance        Extremity/Trunk Assessment   Upper Extremity Assessment: Overall WFL for tasks assessed           Lower Extremity Assessment: Generalized weakness         Communication   Communication: No difficulties  Cognition Arousal/Alertness: Awake/alert Behavior During Therapy: WFL for tasks assessed/performed Overall Cognitive Status: Within Functional Limits for tasks assessed                      General Comments      Exercises Total Joint Exercises Ankle Circles/Pumps: AROM;Both;10 reps Heel Slides: AROM;10 reps;Both Long Arc Quad: AROM;Both;10  reps;15 reps Knee Flexion: AROM;Both;10 reps Marching in Standing: AROM;Both;10 reps;15 reps Other Exercises Other Exercises: Static balance training with feet apart and combinations of eyes open/close, head still/head turns   Assessment/Plan    PT Assessment Patient needs continued PT services  PT Problem List Decreased strength;Decreased activity tolerance;Decreased balance;Decreased mobility          PT Treatment Interventions DME instruction;Gait training;Stair training;Functional mobility training;Therapeutic  activities;Therapeutic exercise;Balance training;Neuromuscular re-education    PT Goals (Current goals can be found in the Care Plan section)  Acute Rehab PT Goals Patient Stated Goal: To walk better and longer distances PT Goal Formulation: With patient Time For Goal Achievement: 05/09/16 Potential to Achieve Goals: Fair    Frequency Min 2X/week   Barriers to discharge        Co-evaluation               End of Session Equipment Utilized During Treatment: Gait belt Activity Tolerance: Patient limited by fatigue Patient left: in chair;with call bell/phone within reach;with chair alarm set;with family/visitor present           Time: [id number] PT Time Calculation (min) (ACUTE ONLY): 32 min   Charges:   PT Evaluation $PT Eval Low Complexity: 1 Procedure PT Treatments $Therapeutic Exercise: 8-22 mins   PT G Codes:        DRoyetta Asal PT, DPT 04/26/16, 12:54 PM

## 2016-04-27 ENCOUNTER — Telehealth: Payer: Self-pay | Admitting: Cardiovascular Disease

## 2016-04-27 LAB — GLUCOSE, CAPILLARY
Glucose-Capillary: 104 mg/dL — ABNORMAL HIGH (ref 65–99)
Glucose-Capillary: 169 mg/dL — ABNORMAL HIGH (ref 65–99)

## 2016-04-27 LAB — PHOSPHORUS: PHOSPHORUS: 4.6 mg/dL (ref 2.5–4.6)

## 2016-04-27 LAB — POTASSIUM: POTASSIUM: 4.3 mmol/L (ref 3.5–5.1)

## 2016-04-27 LAB — MAGNESIUM: Magnesium: 2.1 mg/dL (ref 1.7–2.4)

## 2016-04-27 MED ORDER — IPRATROPIUM-ALBUTEROL 0.5-2.5 (3) MG/3ML IN SOLN: 3.0000 mL | Freq: Four times a day (QID) | RESPIRATORY_TRACT | 2 refills | Status: DC | PRN

## 2016-04-27 MED ORDER — TIOTROPIUM BROMIDE MONOHYDRATE 18 MCG IN CAPS: 18.0000 ug | ORAL_CAPSULE | Freq: Every day | RESPIRATORY_TRACT | 12 refills | Status: DC

## 2016-04-27 MED ORDER — FUROSEMIDE 10 MG/ML IJ SOLN
20.0000 mg | Freq: Once | INTRAMUSCULAR | Status: AC
  Administered 2016-04-27: 20 mg via INTRAVENOUS
  Filled 2016-04-27: qty 2

## 2016-04-27 MED ORDER — IPRATROPIUM-ALBUTEROL 18-103 MCG/ACT IN AERO: 1.0000 | INHALATION_SPRAY | Freq: Four times a day (QID) | RESPIRATORY_TRACT | 2 refills | Status: DC | PRN

## 2016-04-27 MED ORDER — DILTIAZEM HCL 60 MG PO TABS: 60.0000 mg | ORAL_TABLET | Freq: Four times a day (QID) | ORAL | 0 refills | Status: DC

## 2016-04-27 MED ORDER — METOPROLOL SUCCINATE ER 100 MG PO TB24: 100.0000 mg | ORAL_TABLET | Freq: Every day | ORAL | 1 refills | Status: DC

## 2016-04-27 NOTE — Discharge Instructions (Addendum)
Acute Respiratory Distress Syndrome Acute respiratory distress syndrome is a life-threatening condition in which fluid collects in the lungs. This condition can cause severe shortness of breath and low oxygen levels in the blood. It can also cause the lungs and other vital organs to fail. The condition usually develops within 24 to 48 hours of an infection, illness, surgery, or injury. CAUSES This condition may be caused by:  An infection, such as sepsis or pneumonia.  A serious injury to the head or chest.  A major surgery.  A drug overdose.  Breathing in harmful chemicals or smoke.  Blood transfusions.  A blood clot in the lungs. SYMPTOMS Symptoms of this condition include:  Fever.  Difficulty breathing.  Bluish skin (cyanosis).  A fast or irregular heartbeat.  Low blood pressure (hypotension).  Agitation.  Confusion.  Lack of energy (lethargy).  Sweating. DIAGNOSIS This condition may be diagnosed by using tests to rule out other diseases and conditions that cause similar symptoms. You may have:  A chest X-ray or CT scan.  Blood tests.  A sputum culture. In this test, you will be asked to spit so that a sample of lung fluid can be taken for testing.  Bronchoscopy. During this test a thin, flexible tool is passed into the mouth or nose, down the windpipe, and into the lungs. TREATMENT This condition may be treated with:  Oxygen. A breathing machine (ventilator) is often used to provide oxygen and help with breathing.  Medicine to help you relax (sedative).  Fluids and nutrients given through an IV tube.  Blood pressure medicine.  Steroid medicine to help decrease inflammation in the lungs.  Diuretic medicine to get rid of extra fluid in the body. Additional treatment may be needed, depending on the cause of the condition. It can take up to 12 months to recover from this condition. Some people recover fully, but others may continue to  have:  Weakness.  Shortness of breath.  Memory problems.  Depression. HOME CARE INSTRUCTIONS Until you recover from this condition:  Do not smoke.  Limit alcohol intake to no more than 1 drink per day for nonpregnant women and 2 drinks per day for men. One drink equals 12 oz of beer, 5 oz of wine, or 1 oz of hard liquor.  Ask friends and family to help you if daily activities make you tired. SEEK MEDICAL CARE IF:  You become short of breath with activity or while resting.  You develop a cough that does not go away.  You have a fever. SEEK IMMEDIATE MEDICAL CARE IF:  You have sudden shortness of breath.  You develop chest pain that does not go away.  You develop swelling or pain in one of your legs.  You cough up blood.   This information is not intended to replace advice given to you by your health care provider. Make sure you discuss any questions you have with your health care provider.   Document Released: 07/18/2005 Document Revised: 12/02/2014 Document Reviewed: 07/14/2014 Elsevier Interactive Patient Education 2016 [place] Prevention in the Home  Falls can cause injuries and can affect people from all age groups. There are many simple things that you can do to make your home safe and to help prevent falls. WHAT CAN I DO ON THE OUTSIDE OF MY HOME?  Regularly repair the edges of walkways and driveways and fix any cracks.  Remove high doorway thresholds.  Trim any shrubbery on the main path into your  home.  Use bright outdoor lighting.  Clear walkways of debris and clutter, including tools and rocks.  Regularly check that handrails are securely fastened and in good repair. Both sides of any steps should have handrails.  Install guardrails along the edges of any raised decks or porches.  Have leaves, snow, and ice cleared regularly.  Use sand or salt on walkways during winter months.  In the garage, clean up any spills right away, including  grease or oil spills. WHAT CAN I DO IN THE BATHROOM?  Use night lights.  Install grab bars by the toilet and in the tub and shower. Do not use towel bars as grab bars.  Use non-skid mats or decals on the floor of the tub or shower.  If you need to sit down while you are in the shower, use a plastic, non-slip stool.[name]  Keep the floor dry. Immediately clean up any water that spills on the floor.  Remove soap buildup in the tub or shower on a regular basis.  Attach bath mats securely with double-sided non-slip rug tape.  Remove throw rugs and other tripping hazards from the floor. WHAT CAN I DO IN THE BEDROOM?  Use night lights.  Make sure that a bedside light is easy to reach.  Do not use oversized bedding that drapes onto the floor.  Have a firm chair that has side arms to use for getting dressed.  Remove throw rugs and other tripping hazards from the floor. WHAT CAN I DO IN THE KITCHEN?   Clean up any spills right away.  Avoid walking on wet floors.  Place frequently used items in easy-to-reach places.  If you need to reach for something above you, use a sturdy step stool that has a grab bar.  Keep electrical cables out of the way.  Do not use floor polish or wax that makes floors slippery. If you have to use wax, make sure that it is non-skid floor wax.  Remove throw rugs and other tripping hazards from the floor. WHAT CAN I DO IN THE STAIRWAYS?  Do not leave any items on the stairs.  Make sure that there are handrails on both sides of the stairs. Fix handrails that are broken or loose. Make sure that handrails are as long as the stairways.  Check any carpeting to make sure that it is firmly attached to the stairs. Fix any carpet that is loose or worn.  Avoid having throw rugs at the top or bottom of stairways, or secure the rugs with carpet tape to prevent them from moving.  Make sure that you have a light switch at the top of the stairs and the bottom of the  stairs. If you do not have them, have them installed. WHAT ARE SOME OTHER FALL PREVENTION TIPS?  Wear closed-toe shoes that fit well and support your feet. Wear shoes that have rubber soles or low heels.  When you use a stepladder, make sure that it is completely opened and that the sides are firmly locked. Have someone hold the ladder while you are using it. Do not climb a closed stepladder.  Add color or contrast paint or tape to grab bars and handrails in your home. Place contrasting color strips on the first and last steps.  Use mobility aids as needed, such as canes, walkers, scooters, and crutches.  Turn on lights if it is dark. Replace any light bulbs that burn out.  Set up furniture so that there are clear paths. Keep  the furniture in the same spot.  Fix any uneven floor surfaces.  Choose a carpet design that does not hide the edge of steps of a stairway.  Be aware of any and all pets.  Review your medicines with your healthcare provider. Some medicines can cause dizziness or changes in blood pressure, which increase your risk of falling. Talk with your health care provider about other ways that you can decrease your risk of falls. This may include working with a physical therapist or trainer to improve your strength, balance, and endurance.   This information is not intended to replace advice given to you by your health care provider. Make sure you discuss any questions you have with your health care provider.   Document Released: 07/08/2002 Document Revised: 12/02/2014 Document Reviewed: 08/22/2014 Elsevier Interactive Patient Education 2016 [place] in the Home  Falls can cause injuries. They can happen to people of all ages. There are many things you can do to make your home safe and to help prevent falls.  WHAT CAN I DO ON THE OUTSIDE OF MY HOME?  Regularly fix the edges of walkways and driveways and fix any cracks.  Remove anything that might  make you trip as you walk through a door, such as a raised step or threshold.  Trim any bushes or trees on the path to your home.  Use bright outdoor lighting.  Clear any walking paths of anything that might make someone trip, such as rocks or tools.  Regularly check to see if handrails are loose or broken. Make sure that both sides of any steps have handrails.  Any raised decks and porches should have guardrails on the edges.  Have any leaves, snow, or ice cleared regularly.  Use sand or salt on walking paths during winter.  Clean up any spills in your garage right away. This includes oil or grease spills. WHAT CAN I DO IN THE BATHROOM?   Use night lights.  Install grab bars by the toilet and in the tub and shower. Do not use towel bars as grab bars.  Use non-skid mats or decals in the tub or shower.  If you need to sit down in the shower, use a plastic, non-slip stool.  Keep the floor dry. Clean up any water that spills on the floor as soon as it happens.  Remove soap buildup in the tub or shower regularly.  Attach bath mats securely with double-sided non-slip rug tape.  Do not have throw rugs and other things on the floor that can make you trip. WHAT CAN I DO IN THE BEDROOM?  Use night lights.  Make sure that you have a light by your bed that is easy to reach.  Do not use any sheets or blankets that are too big for your bed. They should not hang down onto the floor.  Have a firm chair that has side arms. You can use this for support while you get dressed.  Do not have throw rugs and other things on the floor that can make you trip. WHAT CAN I DO IN THE KITCHEN?  Clean up any spills right away.  Avoid walking on wet floors.  Keep items that you use a lot in easy-to-reach places.  If you need to reach something above you, use a strong step stool that has a grab bar.  Keep electrical cords out of the way.  Do not use floor polish or wax that makes floors  slippery.  If you must use wax, use non-skid floor wax.  Do not have throw rugs and other things on the floor that can make you trip. WHAT CAN I DO WITH MY STAIRS?  Do not leave any items on the stairs.  Make sure that there are handrails on both sides of the stairs and use them. Fix handrails that are broken or loose. Make sure that handrails are as long as the stairways.  Check any carpeting to make sure that it is firmly attached to the stairs. Fix any carpet that is loose or worn.  Avoid having throw rugs at the top or bottom of the stairs. If you do have throw rugs, attach them to the floor with carpet tape.  Make sure that you have a light switch at the top of the stairs and the bottom of the stairs. If you do not have them, ask someone to add them for you. WHAT ELSE CAN I DO TO HELP PREVENT FALLS?  Wear shoes that:  Do not have high heels.  Have rubber bottoms.  Are comfortable and fit you well.  Are closed at the toe. Do not wear sandals.  If you use a stepladder:  Make sure that it is fully opened. Do not climb a closed stepladder.  Make sure that both sides of the stepladder are locked into place.  Ask someone to hold it for you, if possible.  Clearly mark and make sure that you can see:  Any grab bars or handrails.  First and last steps.  Where the edge of each step is.  Use tools that help you move around (mobility aids) if they are needed. These include:  Canes.  Walkers.  Scooters.  Crutches.  Turn on the lights when you go into a dark area. Replace any light bulbs as soon as they burn out.  Set up your furniture so you have a clear path. Avoid moving your furniture around.  If any of your floors are uneven, fix them.  If there are any pets around you, be aware of where they are.  Review your medicines with your doctor. Some medicines can make you feel dizzy. This can increase your chance of falling. Ask your doctor what other things that you  can do to help prevent falls.   This information is not intended to replace advice given to you by your health care provider. Make sure you discuss any questions you have with your health care provider.   Document Released: 05/14/2009 Document Revised: 12/02/2014 Document Reviewed: 08/22/2014 Elsevier Interactive Patient Education 2016 [employer]  DIET:  Cardiac diet  DISCHARGE CONDITION:  Stable  ACTIVITY:  Activity as tolerated  OXYGEN:  Home Oxygen: Yes.     Oxygen Delivery: 2 liters/min via Patient connected to nasal cannula oxygen  DISCHARGE LOCATION:  home    ADDITIONAL DISCHARGE INSTRUCTION:   If you experience worsening of your admission symptoms, develop shortness of breath, life threatening emergency, suicidal or homicidal thoughts you must seek medical attention immediately by calling 911 or calling your MD immediately  if symptoms less severe.  You Must read complete instructions/literature along with all the possible adverse reactions/side effects for all the Medicines you take and that have been prescribed to you. Take any new Medicines after you have completely understood and accpet all the possible adverse reactions/side effects.   Please note  You were cared for by a hospitalist during your hospital stay. If you have any questions about your discharge medications or  the care you received while you were in the hospital after you are discharged, you can call the unit and asked to speak with the hospitalist on call if the hospitalist that took care of you is not available. Once you are discharged, your primary care physician will handle any further medical issues. Please note that NO REFILLS for any discharge medications will be authorized once you are discharged, as it is imperative that you return to your primary care physician (or establish a relationship with a primary care physician if you do not have one) for your aftercare needs so that they can reassess  your need for medications and monitor your lab values.

## 2016-04-27 NOTE — Discharge Summary (Signed)
Bobby Peterson, 75 y.o., DOB 1941-06-13, MRN [id number]. Admission date: 04/21/2016 Discharge Date 04/27/2016 Primary MD Lelon Huh, MD Admitting Physician Vilinda Boehringer, MD  Admission Diagnosis  Community acquired pneumonia [J18.9] Acute respiratory failure with hypoxia ([place]) [J96.01] Sepsis, due to unspecified organism [facility]) [A41.9]  Discharge Diagnosis   Principal Problem:   Acute respiratory failure with hypoxia ([place])   COPD (chronic obstructive pulmonary disease) ([place])   Chronic diastolic heart failure (HCC)   Chronic kidney disease (CKD), stage III (moderate)   Septic shock ([place])   Atrial tachycardia ([place])   Sepsis ([place])   Left lower lobe pneumonia         Hospital Course  Bobby Peterson is a 75 yo male with h/o COPD, CHF, CA and ,Hypertension.  Patient presents to the ED for evaluation of vomiting and shortness of breath noted by his wife earlier this morning.   patient was noted to have respiratory failure and had to be intubated in the ER. He was on the ventilator for a day and then subsequently extubated. Patient was kept on antibiotics. Follow eval showed no evidence of aspiration. His breathing is improved significantly currently is on 2 L of oxygen. Which is chronically on. He also was noted to have atrial fibrillation as well as atrial tachycardia he was started on Cardizem and metoprolol which has been adjusted. He is doing much better breathing is improved and is stable for discharge.           Consults  pulmonary/intensive care, cardiology  Significant Tests:  See full reports for all details     Dg Chest 1 View  Result Date: 04/24/2016 CLINICAL DATA:  Pneumonia. EXAM: CHEST 1 VIEW COMPARISON:  04/21/2016. FINDINGS: Right jugular catheter tip in the superior vena cava. The endotracheal and nasogastric tubes have been removed. The heart remains grossly normal in size. Mild increase in diffuse prominence of the interstitial markings. No significant  change in airspace opacity throughout the left lung. Interval small left pleural effusion. Diffuse osteopenia. IMPRESSION: 1. Stable left lung pneumonia. 2. Interval small left pleural effusion. 3. Interval mild interstitial pulmonary edema. Electronically Signed   By: Claudie Revering M.D.   On: 04/24/2016 08:03   Dg Abdomen 1 View  Result Date: 04/21/2016 CLINICAL DATA:  Enteric tube placement. EXAM: ABDOMEN - 1 VIEW COMPARISON:  CT abdomen and pelvis 01/12/2015 FINDINGS: An enteric tube has been placed with tip in the left upper quadrant consistent with location in the upper stomach. Multiple superimposed lines and wires. These are likely extrinsic. Surgical clips in the right upper quadrant. Visualized bowel gas pattern is unremarkable. Left pleural effusion with left pulmonary infiltrates. IMPRESSION: Enteric tube tip localizes to the left upper quadrant consistent with location in the upper stomach. Electronically Signed   By: Lucienne Capers M.D.   On: 04/21/2016 06:41   Dg Chest Port 1 View  Result Date: 04/25/2016 CLINICAL DATA:  75 year old male with central line placement EXAM: PORTABLE CHEST 1 VIEW COMPARISON:  Chest radiograph dated 04/24/2016 FINDINGS: Right IJ central line with tip over central SVC is stable positioning. There is no pneumothorax. Diffuse left lung interstitial and nodular opacities as well as small left pleural effusion similar to prior radiograph. Right lung base atelectatic changes versus infiltrate. There is stable cardiac silhouette. No acute osseous pathology. IMPRESSION: Right IJ central line with tip over central SVC in stable positioning. No pneumothorax. Stable appearance of the left lung infiltrate and left pleural effusion. Follow-up recommended. Electronically Signed  By: Anner Crete M.D.   On: 04/25/2016 04:18   Dg Chest Portable 1 View  Result Date: 04/21/2016 CLINICAL DATA:  Central line placement EXAM: PORTABLE CHEST 1 VIEW COMPARISON:  04/21/2016  FINDINGS: Endotracheal tube remains in good position. Right jugular central venous catheter tip in the mid SVC without pneumothorax. NG tube enters the stomach Extensive infiltrate in the left upper lobe and left lower lobe is unchanged and compatible with pneumonia. Small left effusion. Right lung remains clear with minimal right lower lobe atelectasis IMPRESSION: Central venous catheter tip in the SVC without pneumothorax Endotracheal tube in good position Extensive pneumonia left upper lobe and left lower lobe is unchanged. Electronically Signed   By: Franchot Gallo M.D.   On: 04/21/2016 08:00   Dg Chest Portable 1 View  Result Date: 04/21/2016 CLINICAL DATA:  Endotracheal tube placement. Patient's iodine ache with gasping respirations. EXAM: PORTABLE CHEST 1 VIEW COMPARISON:  01/27/2016 FINDINGS: Endotracheal tube placed with tip measuring 3.4 cm above the carina. Enteric tube tip is off the field of view but below the left hemidiaphragm. There is interval development since the previous study of diffuse patchy infiltration throughout the left lung most likely represent pneumonia. Small left pleural effusion. Right lung appears clear. IMPRESSION: Appliances appear in satisfactory position. Diffuse infiltration throughout the left lung with small effusion likely representing pneumonia. Electronically Signed   By: Lucienne Capers M.D.   On: 04/21/2016 06:39       Today   Subjective:   Bobby Peterson  patient's breathing is improved his very anxious to go home  Objective:   Blood pressure (!) 148/54, pulse 67, temperature 97.3 F (36.3 C), resp. rate 18, height 5\' 7"  (1.702 m), weight 176 lb 14.4 oz (80.2 kg), SpO2 95 %.  .  Intake/Output Summary (Last 24 hours) at 04/27/16 1206 Last data filed at 04/27/16 0823  Gross per 24 hour  Intake               10 ml  Output                0 ml  Net               10 ml    Exam VITAL SIGNS: Blood pressure (!) 148/54, pulse 67, temperature 97.3 F  (36.3 C), resp. rate 18, height 5\' 7"  (1.702 m), weight 176 lb 14.4 oz (80.2 kg), SpO2 95 %.  GENERAL:  75 y.o.-year-old patient lying in the bed with no acute distress.  EYES: Pupils equal, round, reactive to light and accommodation. No scleral icterus. Extraocular muscles intact.  HEENT: Head atraumatic, normocephalic. Oropharynx and nasopharynx clear.  NECK:  Supple, no jugular venous distention. No thyroid enlargement, no tenderness.  LUNGS: Normal breath sounds bilaterally, no wheezing, rales,rhonchi or crepitation. No use of accessory muscles of respiration.  CARDIOVASCULAR: S1, S2 normal. No murmurs, rubs, or gallops.  ABDOMEN: Soft, nontender, nondistended. Bowel sounds present. No organomegaly or mass.  EXTREMITIES: No pedal edema, cyanosis, or clubbing.  NEUROLOGIC: Cranial nerves II through XII are intact. Muscle strength 5/5 in all extremities. Sensation intact. Gait not checked.  PSYCHIATRIC: The patient is alert and oriented x 3.  SKIN: No obvious rash, lesion, or ulcer.   Data Review     CBC w Diff: Lab Results  Component Value Date   WBC 20.4 (H) 04/26/2016   HGB 14.4 04/26/2016   HGB 13.8 10/10/2013   HCT 44.0 04/26/2016   HCT 40.8 10/10/2013  PLT 255 04/26/2016   PLT 252 10/10/2013   LYMPHOPCT 13 04/21/2016   LYMPHOPCT 4.9 10/10/2013   MONOPCT 1 04/21/2016   MONOPCT 7.1 10/10/2013   EOSPCT 0 04/21/2016   EOSPCT 0.0 10/10/2013   BASOPCT 0 04/21/2016   BASOPCT 0.3 10/10/2013   CMP: Lab Results  Component Value Date   NA 142 04/26/2016   NA 141 10/11/2013   K 4.3 04/27/2016   K 4.6 10/11/2013   CL 107 04/26/2016   CL 103 10/11/2013   CO2 26 04/26/2016   CO2 32 10/11/2013   BUN 35 (H) 04/26/2016   BUN 67 (H) 10/11/2013   CREATININE 1.53 (H) 04/26/2016   CREATININE 2.14 (H) 10/11/2013   PROT 7.9 04/21/2016   PROT 7.8 10/07/2013   ALBUMIN 3.9 04/21/2016   ALBUMIN 3.4 10/07/2013   BILITOT 0.2 (L) 04/21/2016   BILITOT 0.4 10/07/2013   ALKPHOS 102  04/21/2016   ALKPHOS 116 10/07/2013   AST 23 04/21/2016   AST 29 10/07/2013   ALT 16 (L) 04/21/2016   ALT 18 10/07/2013  .  Micro Results Recent Results (from the past 240 hour(s))  Blood culture (routine x 2)     Status: None   Collection Time: 04/21/16  5:25 AM  Result Value Ref Range Status   Specimen Description BLOOD LEFT WRIST  Final   Special Requests   Final    BOTTLES DRAWN AEROBIC AND ANAEROBIC AER 9ML ANA 6ML   Culture NO GROWTH 5 DAYS  Final   Report Status 04/26/2016 FINAL  Final  Blood culture (routine x 2)     Status: None   Collection Time: 04/21/16  5:25 AM  Result Value Ref Range Status   Specimen Description BLOOD RIGHT FORE ARM  Final   Special Requests   Final    BOTTLES DRAWN AEROBIC AND ANAEROBIC AER 4ML ANA 3ML   Culture NO GROWTH 5 DAYS  Final   Report Status 04/26/2016 FINAL  Final  Urine culture     Status: None   Collection Time: 04/21/16  6:30 AM  Result Value Ref Range Status   Specimen Description URINE, RANDOM  Final   Special Requests NONE  Final   Culture NO GROWTH Performed at [facility]   Final   Report Status 04/22/2016 FINAL  Final  Rapid Influenza A&B Antigens ([place] only)     Status: None   Collection Time: 04/21/16  6:34 AM  Result Value Ref Range Status   Influenza A ([facility]) NEGATIVE NEGATIVE Final   Influenza B ([facility]) NEGATIVE NEGATIVE Final  MRSA PCR Screening     Status: Abnormal   Collection Time: 04/21/16  8:42 AM  Result Value Ref Range Status   MRSA by PCR POSITIVE (A) NEGATIVE Final    Comment:        The GeneXpert MRSA Assay (FDA approved for NASAL specimens only), is one component of a comprehensive MRSA colonization surveillance program. It is not intended to diagnose MRSA infection nor to guide or monitor treatment for MRSA infections. RESULT CALLED TO, READ BACK BY AND VERIFIED WITH: NIA DILE 04/21/16 1051 SGD   Culture, respiratory (NON-Expectorated)     Status: None   Collection Time: 04/21/16  12:01 PM  Result Value Ref Range Status   Specimen Description TRACHEAL ASPIRATE  Final   Special Requests NONE  Final   Gram Stain   Final    ABUNDANT WBC PRESENT,BOTH PMN AND MONONUCLEAR RARE GRAM POSITIVE COCCI IN PAIRS RARE GRAM  NEGATIVE RODS RARE YEAST Performed at [place]  Final   Report Status 04/23/2016 FINAL  Final   Organism ID, Bacteria KLEBSIELLA PNEUMONIAE  Final      Susceptibility   Klebsiella pneumoniae - MIC*    AMPICILLIN >=32 RESISTANT Resistant     CEFAZOLIN <=4 SENSITIVE Sensitive     CEFEPIME <=1 SENSITIVE Sensitive     CEFTAZIDIME <=1 SENSITIVE Sensitive     CEFTRIAXONE <=1 SENSITIVE Sensitive     CIPROFLOXACIN <=0.25 SENSITIVE Sensitive     GENTAMICIN <=1 SENSITIVE Sensitive     IMIPENEM <=0.25 SENSITIVE Sensitive     TRIMETH/SULFA <=20 SENSITIVE Sensitive     AMPICILLIN/SULBACTAM 4 SENSITIVE Sensitive     PIP/TAZO <=4 SENSITIVE Sensitive     Extended ESBL NEGATIVE Sensitive     * FEW KLEBSIELLA PNEUMONIAE        Code Status Orders        Start     Ordered   04/21/16 0702  Full code  Continuous     04/21/16 0702    Code Status History    Date Active Date Inactive Code Status Order ID Comments User Context   04/21/2016  7:02 AM 04/21/2016  5:03 PM Full Code [id number]  Holley Raring, NP ED   01/04/2015  2:01 PM 01/07/2015  5:37 PM Full Code [id number]  Greggory Keen, MD Inpatient   01/03/2015  3:03 PM 01/04/2015  2:01 PM Full Code [id number]  Seeplaputhur Robinette Haines, MD Inpatient          Follow-up Information    Lelon Huh, MD Follow up in 7 day(s).   Specialty:  Family Medicine Why:  Tuesday, October 10th at 2pm, [employer] information: [address] [place] [address] [place], MD Follow up in 2 week(s).   Specialty:  Cardiology Why:  Thursday, October 12th at 1130am, [facility] Contact information: [address]  [place]  [address] [phone]        Inc. - Dme [facility] .   Why:  Home Nebulizer [employer] cabin crew information: [place] [address] [phone]           Discharge Medications     Medication List    STOP taking these medications   azithromycin 250 MG tablet Commonly known as:  ZITHROMAX Z-PAK   doxycycline 100 MG capsule Commonly known as:  VIBRAMYCIN     TAKE these medications   albuterol-ipratropium 18-103 MCG/ACT inhaler Commonly known as:  COMBIVENT Inhale 1-2 puffs into the lungs every 6 (six) hours as needed for wheezing or shortness of breath.   ipratropium-albuterol 0.5-2.5 (3) MG/3ML Soln Commonly known as:  DUONEB Take 3 mLs by nebulization every 6 (six) hours as needed.   aspirin 81 MG tablet Take 81 mg by mouth daily.   diltiazem 60 MG tablet Commonly known as:  CARDIZEM Take 1 tablet (60 mg total) by mouth every 6 (six) hours.   Fluticasone-Salmeterol 250-50 MCG/DOSE Aepb Commonly known as:  ADVAIR DISKUS Inhale 1 puff into the lungs 2 (two) times daily.   furosemide 40 MG tablet Commonly known as:  LASIX Take 1 tablet (40 mg total) by mouth daily.   gabapentin 300 MG capsule Commonly known as:  NEURONTIN Take 1 capsule (300 mg total) by mouth 2 (two) times daily.   losartan 50 MG tablet Commonly known as:  COZAAR TAKE 1 TABLET EVERY DAY   metoprolol succinate 100 MG 24 hr tablet Commonly known as:  TOPROL-XL Take 1 tablet (100 mg total) by mouth daily. Take with or immediately following a meal. What changed:  medication strength  how much to take  additional instructions   omeprazole 40 MG capsule Commonly known as:  PRILOSEC Take 40 mg by mouth daily before breakfast.   OXYGEN Inhale 2 L/min into the lungs continuous.   pravastatin 20 MG tablet Commonly known as:  PRAVACHOL Take 1 tablet (20 mg total) by mouth daily.   tamsulosin 0.4 MG Caps capsule Commonly known as:  FLOMAX Take 1 capsule  (0.4 mg total) by mouth daily.   tiotropium 18 MCG inhalation capsule Commonly known as:  SPIRIVA HANDIHALER Place 1 capsule (18 mcg total) into inhaler and inhale daily.   vitamin B-12 1000 MCG tablet Commonly known as:  CYANOCOBALAMIN Take 1,000 mcg by mouth once a week. *Take on Monday*          Total Time in preparing paper work, data evaluation and todays exam - 35 minutes  Dustin Flock M.D on 04/27/2016 at 12:06 [facility]  [facility]   Office  [phone]

## 2016-04-27 NOTE — Telephone Encounter (Signed)
TCM.Marland KitchenMarland KitchenMarland KitchenMarland KitchenPt being discharged today  Saw Dr Fletcher Anon in hospital Pt is coming 05/12/16 to see Dr Fletcher Anon

## 2016-04-27 NOTE — Telephone Encounter (Signed)
Patient contacted regarding discharge from [facility] on 04/27/16.  Patient understands to follow up with provider Fletcher Anon on Oct 12 at 11:30 at [facility], [place]. Patient understands discharge instructions? yes Patient understands medications and regiment? yes Patient understands to bring all medications to this visit? yes  Pt requested that I then review information w/his wife. She verbalized understanding w/no further questions at this time.

## 2016-04-27 NOTE — Care Management (Signed)
Patient has requested a home nebulizer machine.  It has been greater than 5 years since insurance paid for his previous machine that is not working

## 2016-04-27 NOTE — Care Management (Signed)
Patient's wife is present and she very firmly declines any home health.  She says that she "knows what to look for  and knows how to care for patient."  Of note, patient does not have documented frequent presentations to [facility] or [facility] facilities over the past 6 months.  Portable 02 tank is present for transport home

## 2016-04-27 NOTE — Progress Notes (Addendum)
[place] consulted for electrolyte and constipation management in this 68 yoM admitted with respiratory failure and PNA.  1. Electrolytes: 9/27 am labs: K = 4.3; Ca = 9; Phos = 4.6; mag = 2.1. No need for replacement today. Electrolytes have been WNL for several days. Pharmacy will sign off.  2. Constipation prevention: BM on 9/25. Continue senna/docuate.Phamacy to sign off  3.  Abx: Pt on day 4 of levofloxacin. Day 7 of total antibiotic days. Stop date is 9/27  Allergies  Allergen Reactions  . Fenofibrate     Other reaction(s): Headache  . Lisinopril Cough  . Niacin     Other reaction(s): Dizziness  . Phenergan [Promethazine Hcl]     NVD  . Simvastatin     Other reaction(s): Headache    Patient Measurements: Height: 5\' 7"  (170.2 cm) Weight: 176 lb 14.4 oz (80.2 kg) IBW/kg (Calculated) : 66.1   Vital Signs: Temp: 97.3 F (36.3 C) (09/27 0459) Temp Source: Oral (09/26 1944) BP: 148/54 (09/27 0459) Pulse Rate: 67 (09/27 0459) Intake/Output from previous day: 09/26 0701 - 09/27 0700 In: 10 [I.V.:10] Out: 0  Intake/Output from this shift: No intake/output data recorded.  Labs:  Recent Labs  04/26/16 0633  WBC 20.4*  HGB 14.4  HCT 44.0  PLT 255     Recent Labs  04/25/16 0420 04/26/16 0633 04/27/16 0305  NA 141 142  --   K 3.8 4.7 4.3  CL 111 107  --   CO2 23 26  --   GLUCOSE 137* 117*  --   BUN 33* 35*  --   CREATININE 1.48* 1.53*  --   CALCIUM 8.4* 9.0  --   MG 2.0 2.2 2.1  PHOS 3.2 3.7 4.6   Estimated Creatinine Clearance: 41 mL/min (by C-G formula based on SCr of 1.53 mg/dL (H)).    Recent Labs  04/26/16 1225 04/26/16 1704 04/26/16 2104  GLUCAP 149* 160* 164*    Medications:  Scheduled:  . aspirin  81 mg Oral Daily  . budesonide (PULMICORT) nebulizer solution  0.5 mg Nebulization BID  . Chlorhexidine Gluconate Cloth  6 each Topical Q0600  . diltiazem  60 mg Oral Q6H  . enoxaparin (LOVENOX) injection  40 mg Subcutaneous  Q24H  . famotidine  20 mg Oral Daily  . gabapentin  300 mg Oral BID  . insulin aspart  0-15 Units Subcutaneous TID AC & HS  . ipratropium-albuterol  3 mL Nebulization Q6H  . levofloxacin (LEVAQUIN) IV  250 mg Intravenous Q24H  . mouth rinse  15 mL Mouth Rinse BID  . metoprolol succinate  100 mg Oral Daily  . mometasone-formoterol  2 puff Inhalation BID  . mupirocin ointment  1 application Nasal BID  . nicotine  14 mg Transdermal Daily  . pravastatin  20 mg Oral q1800  . QUEtiapine  25 mg Oral QHS  . senna-docusate  1 tablet Oral BID  . sodium chloride flush  10-40 mL Intracatheter Q12H  . tamsulosin  0.4 mg Oral Daily   Infusions:     Ramond Dial, Pharm.D Clinical Pharmacist  04/27/2016 7:36 AM

## 2016-04-27 NOTE — Progress Notes (Signed)
Patient discharged via wheelchair and private vehicle. IV removed and catheter intact. All discharge instructions given and patient verbalizes understanding. Tele removed and returned. No prescriptions given to patient No distress noted.   

## 2016-05-10 ENCOUNTER — Ambulatory Visit
Admission: RE | Admit: 2016-05-10 | Discharge: 2016-05-10 | Disposition: A | Discharge status: Home / Self Care | Payer: Commercial Managed Care - HMO | Source: Ambulatory Visit | Attending: Family Medicine | Admitting: Family Medicine

## 2016-05-10 ENCOUNTER — Encounter: Payer: Self-pay | Admitting: Family Medicine

## 2016-05-10 ENCOUNTER — Ambulatory Visit (INDEPENDENT_AMBULATORY_CARE_PROVIDER_SITE_OTHER): Payer: Commercial Managed Care - HMO | Admitting: Family Medicine

## 2016-05-10 VITALS — BP 108/64 | HR 80 | Temp 98.4°F | Resp 20 | Wt 183.0 lb

## 2016-05-10 DIAGNOSIS — Z23 Encounter for immunization: Secondary | ICD-10-CM | POA: Diagnosis not present

## 2016-05-10 DIAGNOSIS — F17210 Nicotine dependence, cigarettes, uncomplicated: Secondary | ICD-10-CM | POA: Diagnosis not present

## 2016-05-10 DIAGNOSIS — J189 Pneumonia, unspecified organism: Secondary | ICD-10-CM

## 2016-05-10 DIAGNOSIS — J441 Chronic obstructive pulmonary disease with (acute) exacerbation: Secondary | ICD-10-CM

## 2016-05-10 DIAGNOSIS — I471 Supraventricular tachycardia: Secondary | ICD-10-CM | POA: Diagnosis not present

## 2016-05-10 DIAGNOSIS — R05 Cough: Secondary | ICD-10-CM | POA: Diagnosis not present

## 2016-05-10 HISTORY — DX: Nicotine dependence, cigarettes, uncomplicated: F17.210

## 2016-05-10 MED ORDER — METOPROLOL SUCCINATE ER 100 MG PO TB24: 100.0000 mg | ORAL_TABLET | Freq: Every day | ORAL | 3 refills | Status: DC

## 2016-05-10 NOTE — Progress Notes (Signed)
Patient: Bobby Peterson Male    DOB: 17-Oct-1940   75 y.o.   MRN: [id number] Visit Date: 05/10/2016  Today's Provider: Lelon Huh, MD   Chief Complaint  Patient presents with  . Hospitalization Follow-up  . Shortness of Breath   Subjective:    HPI  Follow up Hospitalization  Patient was admitted to [facility] on 04/21/2016  and discharged on 04/27/2016. He was treated for acute hypoxia and respiratory failure.  Patient presented with symptoms vomiting and shortness of breath. He was noted to have respiratory failure, and had to be intubated in the ER. He was just on the ventilator for 1 day. He was then stable on 2L of O2. He was also noted to have A-fib as well as artrial tachycardia. He was started on Cardizem and Metoprolol. He also has a follow up appt with Dr. Rockey Situ on 05/12/2016.       Allergies  Allergen Reactions  . Fenofibrate     Other reaction(s): Headache  . Lisinopril Cough  . Niacin     Other reaction(s): Dizziness  . Phenergan [Promethazine Hcl]     NVD  . Simvastatin     Other reaction(s): Headache     Current Outpatient Prescriptions:  .  albuterol-ipratropium (COMBIVENT) 18-103 MCG/ACT inhaler, Inhale 1-2 puffs into the lungs every 6 (six) hours as needed for wheezing or shortness of breath., Disp: 1 Inhaler, Rfl: 2 .  aspirin 81 MG tablet, Take 81 mg by mouth daily., Disp: , Rfl:  .  diltiazem (CARDIZEM) 60 MG tablet, Take 1 tablet (60 mg total) by mouth every 6 (six) hours., Disp: 120 tablet, Rfl: 0 .  Fluticasone-Salmeterol (ADVAIR DISKUS) 250-50 MCG/DOSE AEPB, Inhale 1 puff into the lungs 2 (two) times daily., Disp: 180 each, Rfl: 3 .  furosemide (LASIX) 40 MG tablet, Take 1 tablet (40 mg total) by mouth daily., Disp: 90 tablet, Rfl: 3 .  gabapentin (NEURONTIN) 300 MG capsule, Take 1 capsule (300 mg total) by mouth 2 (two) times daily., Disp: 180 capsule, Rfl: 3 .  ipratropium-albuterol (DUONEB) 0.5-2.5 (3) MG/3ML SOLN, Take 3 mLs by  nebulization every 6 (six) hours as needed., Disp: 360 mL, Rfl: 2 .  losartan (COZAAR) 50 MG tablet, TAKE 1 TABLET EVERY DAY, Disp: 90 tablet, Rfl: 3 .  metoprolol succinate (TOPROL-XL) 100 MG 24 hr tablet, Take 1 tablet (100 mg total) by mouth daily. Take with or immediately following a meal., Disp: 30 tablet, Rfl: 1 .  omeprazole (PRILOSEC) 40 MG capsule, Take 40 mg by mouth daily before breakfast., Disp: , Rfl:  .  OXYGEN, Inhale 2 L/min into the lungs continuous. , Disp: , Rfl:  .  pravastatin (PRAVACHOL) 20 MG tablet, Take 1 tablet (20 mg total) by mouth daily., Disp: 90 tablet, Rfl: 3 .  tamsulosin (FLOMAX) 0.4 MG CAPS capsule, Take 1 capsule (0.4 mg total) by mouth daily., Disp: 90 capsule, Rfl: 3 .  tiotropium (SPIRIVA HANDIHALER) 18 MCG inhalation capsule, Place 1 capsule (18 mcg total) into inhaler and inhale daily., Disp: 30 capsule, Rfl: 12 .  vitamin B-12 (CYANOCOBALAMIN) 1000 MCG tablet, Take 1,000 mcg by mouth once a week. *Take on Monday*, Disp: , Rfl:   Review of Systems  Constitutional: Positive for activity change and fatigue. Negative for appetite change, chills, diaphoresis, fever and unexpected weight change.  Respiratory: Positive for cough, chest tightness, shortness of breath and wheezing.   Cardiovascular: Negative.   Musculoskeletal: Negative.   Skin: Negative.  Neurological: Negative.   Psychiatric/Behavioral: Negative.     Social History  Substance Use Topics  . Smoking status: Former Smoker    Packs/day: 1.00    Years: 50.00    Types: Cigarettes    Quit date: 12/04/2003  . Smokeless tobacco: Current User    Types: Chew     Comment: Has been chewing tobacco since quitting smoking in 2005.  [name] Alcohol use No   Objective:   BP 108/64 (BP Location: Left Arm, Patient Position: Sitting, Cuff Size: Normal)   Pulse 80   Temp 98.4 F (36.9 C)   Resp 20   Wt 183 lb (83 kg)   SpO2 94%   BMI 28.66 kg/m   Physical Exam   General Appearance:    Alert,  cooperative, no distress  Eyes:    PERRL, conjunctiva/corneas clear, EOM's intact       Lungs:     Poor air movement. Clear to auscultation bilaterally, respirations unlabored  Heart:    Regular rate and rhythm  Neurologic:   Awake, alert, oriented x 3. No apparent focal neurological           defect.           Assessment & Plan:     1. Pneumonia due to infectious organism, unspecified laterality, unspecified part of lung Symptomatically resolved.  - DG Chest 2 View; Future  2. Chronic obstructive pulmonary disease with acute exacerbation ([place])   3. Need for pneumococcal vaccination   4. Smoking 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 pack years   5. Atrial tachycardia, unspecified type (HCC) Regular rhythm on auscultation today. Continue current medications for now. To follow up with Dr. Rockey Situ on 05-12-16 as scheduled. Consider reducing BP medications after follow up with cardiology.        Lelon Huh, MD  [facility]

## 2016-05-12 ENCOUNTER — Encounter: Payer: Self-pay | Admitting: Cardiovascular Disease

## 2016-05-12 ENCOUNTER — Ambulatory Visit (INDEPENDENT_AMBULATORY_CARE_PROVIDER_SITE_OTHER): Payer: Commercial Managed Care - HMO | Admitting: Cardiovascular Disease

## 2016-05-12 VITALS — BP 102/44 | HR 74 | Ht 65.0 in | Wt 180.5 lb

## 2016-05-12 DIAGNOSIS — I5032 Chronic diastolic (congestive) heart failure: Secondary | ICD-10-CM | POA: Diagnosis not present

## 2016-05-12 DIAGNOSIS — M79606 Pain in leg, unspecified: Secondary | ICD-10-CM | POA: Diagnosis not present

## 2016-05-12 DIAGNOSIS — I471 Supraventricular tachycardia: Secondary | ICD-10-CM

## 2016-05-12 DIAGNOSIS — I10 Essential (primary) hypertension: Secondary | ICD-10-CM

## 2016-05-12 MED ORDER — DILTIAZEM HCL 60 MG PO TABS: 60.0000 mg | ORAL_TABLET | Freq: Two times a day (BID) | ORAL | 3 refills | Status: DC

## 2016-05-12 NOTE — Patient Instructions (Signed)
Medication Instructions:  Your physician has recommended you make the following change in your medication:  DECREASE diltiazem to 60mg  twice daily STOP taking losartan  Labwork: none  Testing/Procedures: Your physician has requested that you have a lower extremity arterial exercise duplex. During this test, exercise and ultrasound are used to evaluate arterial blood flow in the legs. Allow one hour for this exam. There are no restrictions or special instructions.   Follow-Up: Your physician recommends that you schedule a follow-up appointment in: 3 months with Dr. Fletcher Anon.    Any Other Special Instructions Will Be Listed Below (If Applicable).     If you need a refill on your cardiac medications before your next appointment, please call your pharmacy.

## 2016-05-12 NOTE — Progress Notes (Signed)
Cardiology Office Note   Date:  05/12/2016   ID:  Bobby Peterson, DOB 19-Apr-1941, MRN [id number]  PCP:  Lelon Huh, MD  Cardiologist:   Kathlyn Sacramento, MD   Chief Complaint  Patient presents with  . other    Follow up from [facility]. Meds reviewed by the pt. verbally.       History of Present Illness: Bobby Peterson is a 75 y.o. male who presents for A follow-up visit regarding multifocal atrial tachycardia and coronary artery disease. He has known history of coronary artery disease with remote cardiac cath being managed medically, chronic respiratory failure on home oxygen due to COPD from previous tobacco use, renal cell cancer status post nephrectomy, essential hypertension, diabetes mellitus and obesity. He was hospitalized recently at [facility] with respiratory failure which required intubation. He was found to have pneumonia and was treated. He developed multifocal atrial tachycardia which was treated with rate control with diltiazem and metoprolol. He had an echocardiogram done which showed normal LV systolic function, mild aortic regurgitation, mildly dilated left atrium and mild pulmonary hypertension. He has been doing reasonably well since hospital discharge with no recurrent tachycardia. His blood pressure has been running low. He denies any chest pain. He continues to use home oxygen. His biggest problem is bilateral leg pain from the knee down with minimal walking. He thinks it might be related to neuropathy.    Past Medical History:  Diagnosis Date  . Acute respiratory failure with hypoxia ([place]) 12/03/2013   Overview:  Overview:  2 L O2 continuously  Last Assessment & Plan:  Continue 2 L O2 continuously   . Chronic diastolic CHF (congestive heart failure) ([place])   . Chronic respiratory failure (HCC)    a. on home O2  . COPD (chronic obstructive pulmonary disease) ([place])   . Coronary artery disease   . Diabetes ([place])   . Hernia, umbilical   . Hypercholesteremia   .  Hypertension   . Obesity   . Renal cancer [facility])    a. s/p nephrectomy in 1980's    Past Surgical History:  Procedure Laterality Date  . CHOLECYSTECTOMY N/A 12/22/2014   Procedure: LAPAROSCOPIC CHOLECYSTECTOMY WITH INTRAOPERATIVE CHOLANGIOGRAM;  Surgeon: Christene Lye, MD;  Location: [facility] ORS;  Service: General;  Laterality: N/A;  . COLONOSCOPY    . ERCP N/A 01/05/2015   Procedure: ENDOSCOPIC RETROGRADE CHOLANGIOPANCREATOGRAPHY (ERCP);  Surgeon: Hulen Luster, MD;  Location: [facility] ENDOSCOPY;  Service: Endoscopy;  Laterality: N/A;  . ERCP N/A 02/19/2015   Procedure: ENDOSCOPIC RETROGRADE CHOLANGIOPANCREATOGRAPHY (ERCP);  Surgeon: Hulen Luster, MD;  Location: [facility] ENDOSCOPY;  Service: Gastroenterology;  Laterality: N/A;  . GALLBLADDER SURGERY  11/2014  . NEPHRECTOMY  1990  . OMENTECTOMY  12/22/2014   Procedure: OMENTECTOMY;  Surgeon: Christene Lye, MD;  Location: [facility] ORS;  Service: General;;  Partial omentectomy  . STENT REMOVAL    . UMBILICAL HERNIA REPAIR N/A 12/22/2014   Procedure: HERNIA REPAIR UMBILICAL ADULT;  Surgeon: Christene Lye, MD;  Location: [facility] ORS;  Service: General;  Laterality: N/A;     Current Outpatient Prescriptions  Medication Sig Dispense Refill  . albuterol-ipratropium (COMBIVENT) 18-103 MCG/ACT inhaler Inhale 1-2 puffs into the lungs every 6 (six) hours as needed for wheezing or shortness of breath. 1 Inhaler 2  . aspirin 81 MG tablet Take 81 mg by mouth daily.    [name] diltiazem (CARDIZEM) 60 MG tablet Take 1 tablet (60 mg total) by mouth 2 (two) times daily.  60 tablet 3  . Fluticasone-Salmeterol (ADVAIR DISKUS) 250-50 MCG/DOSE AEPB Inhale 1 puff into the lungs 2 (two) times daily. 180 each 3  . furosemide (LASIX) 40 MG tablet Take 1 tablet (40 mg total) by mouth daily. 90 tablet 3  . gabapentin (NEURONTIN) 300 MG capsule Take 1 capsule (300 mg total) by mouth 2 (two) times daily. 180 capsule 3  . ipratropium-albuterol (DUONEB) 0.5-2.5 (3) MG/3ML SOLN  Take 3 mLs by nebulization every 6 (six) hours as needed. 360 mL 2  . metoprolol succinate (TOPROL-XL) 100 MG 24 hr tablet Take 1 tablet (100 mg total) by mouth daily. Take with or immediately following a meal. 90 tablet 3  . omeprazole (PRILOSEC) 40 MG capsule Take 40 mg by mouth daily before breakfast.    . OXYGEN Inhale 2 L/min into the lungs continuous.     . pravastatin (PRAVACHOL) 20 MG tablet Take 1 tablet (20 mg total) by mouth daily. 90 tablet 3  . tamsulosin (FLOMAX) 0.4 MG CAPS capsule Take 1 capsule (0.4 mg total) by mouth daily. 90 capsule 3  . tiotropium (SPIRIVA HANDIHALER) 18 MCG inhalation capsule Place 1 capsule (18 mcg total) into inhaler and inhale daily. 30 capsule 12  . vitamin B-12 (CYANOCOBALAMIN) 1000 MCG tablet Take 1,000 mcg by mouth once a week. *Take on Monday*     No current facility-administered medications for this visit.     Allergies:   Fenofibrate; Lisinopril; Niacin; Phenergan [promethazine hcl]; and Simvastatin    Social History:  The patient  reports that he quit smoking about 12 years ago. His smoking use included Cigarettes. He has a 50.00 pack-year smoking history. His smokeless tobacco use includes Chew. He reports that he does not drink alcohol or use drugs.   Family History:  The patient's family history includes Hypertension in his mother.    ROS:  Please see the history of present illness.   Otherwise, review of systems are positive for none.   All other systems are reviewed and negative.    PHYSICAL EXAM: VS:  BP (!) 102/44 (BP Location: Left Arm, Patient Position: Sitting, Cuff Size: Normal)   Pulse 74   Ht 5\' 5"  (1.651 m)   Wt 180 lb 8 oz (81.9 kg)   BMI 30.04 kg/m  , BMI Body mass index is 30.04 kg/m. GEN: Well nourished, well developed, in no acute distress  HEENT: normal  Neck: no JVD, carotid bruits, or masses Cardiac: RRR; no murmurs, rubs, or gallops,no edema  Respiratory:  clear to auscultation bilaterally, normal work of  breathing GI: soft, nontender, nondistended, + BS MS: no deformity or atrophy  Skin: warm and dry, no rash Neuro:  Strength and sensation are intact Psych: euthymic mood, full affect   EKG:  EKG is ordered today. The ekg ordered today demonstrates normal sinus rhythm with sinus arrhythmia. Nonspecific ST changes.   Recent Labs: 04/21/2016: ALT 16; B Natriuretic Peptide 41.0; TSH 1.556 04/26/2016: BUN 35; Creatinine, Ser 1.53; Hemoglobin 14.4; Platelets 255; Sodium 142 04/27/2016: Magnesium 2.1; Potassium 4.3    Lipid Panel No results found for: CHOL, TRIG, HDL, CHOLHDL, VLDL, LDLCALC, LDLDIRECT    Wt Readings from Last 3 Encounters:  05/12/16 180 lb 8 oz (81.9 kg)  05/10/16 183 lb (83 kg)  04/27/16 176 lb 14.4 oz (80.2 kg)     No flowsheet data found.    ASSESSMENT AND PLAN:  1.  Multifocal atrial tachycardia: Was likely related to his lung disease. There was no evidence  of atrial fibrillation. He has been having hard time taking diltiazem for times daily. Given that his blood pressure is relatively low, I asked him to take diltiazem 60 mg twice daily instead of 4 times daily. He is also on Toprol 100 mg once daily. We can always consider switching to diltiazem to long-acting if needed.  2. Coronary artery disease involving native coronary arteries without angina: The patient currently denies anginal symptoms. Continue medical therapy.  3. Bilateral leg pain from the knees down: Possible neuropathy but he has diminished distal pulses. I requested lower extremity arterial Doppler to evaluate for possible peripheral arterial disease.  4. Essential hypertension: His blood pressure has been running low since the addition of diltiazem and increasing Toprol. Thus, I discontinued losartan.    Disposition:   FU with me in 3 months  Signed,  Kathlyn Sacramento, MD  05/12/2016 1:10 PM    [facility]

## 2016-05-13 ENCOUNTER — Telehealth: Payer: Self-pay | Admitting: *Deleted

## 2016-05-13 MED ORDER — LEVOFLOXACIN 500 MG PO TABS: 500.0000 mg | ORAL_TABLET | Freq: Two times a day (BID) | ORAL | 0 refills | Status: DC

## 2016-05-13 NOTE — Telephone Encounter (Signed)
-----   Message from Birdie Sons, MD sent at 05/12/2016  4:37 PM EDT ----- Chest xray has not cleared up.  Need to start back on levofloxacin 500mg  twice daily for 10 days. Needs to schedule follow up o.v in 10-14 days. If not cleared up by then will need CT scan of lungs.

## 2016-05-15 DIAGNOSIS — J449 Chronic obstructive pulmonary disease, unspecified: Secondary | ICD-10-CM | POA: Diagnosis not present

## 2016-05-16 DIAGNOSIS — J449 Chronic obstructive pulmonary disease, unspecified: Secondary | ICD-10-CM | POA: Diagnosis not present

## 2016-05-23 ENCOUNTER — Telehealth: Payer: Self-pay | Admitting: Family Medicine

## 2016-05-23 MED ORDER — NYSTATIN 100000 UNIT/ML MT SUSP: 5.0000 mL | Freq: Four times a day (QID) | OROMUCOSAL | 0 refills | Status: DC

## 2016-05-23 NOTE — Telephone Encounter (Signed)
Pt's wife called saying he thinks he has thrush in his mouth.  She said he said he has blisters and wants to know if Dr. Caryn Section or Mikki Santee can call something in to [facility].  Please call them back and let them know if something was called in.  [phone]  Thanks teri

## 2016-05-23 NOTE — Telephone Encounter (Signed)
Pt's wife called wanting to know the status of getting a rx. Called in.  Thanks [name]

## 2016-05-23 NOTE — Telephone Encounter (Signed)
Please advise 

## 2016-05-24 ENCOUNTER — Telehealth: Payer: Self-pay | Admitting: Family Medicine

## 2016-05-24 NOTE — Telephone Encounter (Signed)
Pt's wife called regarding the thrush in [place]'s mouth.    Their call back is  [phone]  Thanks [name]

## 2016-05-25 NOTE — Telephone Encounter (Signed)
rx for nystatin was sent to [facility] [address] on Monday. Sorry, I thought the pharmacy would call when prescription was ready.

## 2016-05-25 NOTE — Telephone Encounter (Signed)
Rx was picked-up already.

## 2016-05-25 NOTE — Telephone Encounter (Signed)
Please advise 

## 2016-05-27 DIAGNOSIS — J449 Chronic obstructive pulmonary disease, unspecified: Secondary | ICD-10-CM | POA: Diagnosis not present

## 2016-05-27 DIAGNOSIS — I5032 Chronic diastolic (congestive) heart failure: Secondary | ICD-10-CM | POA: Diagnosis not present

## 2016-06-02 ENCOUNTER — Other Ambulatory Visit: Payer: Self-pay | Admitting: Cardiovascular Disease

## 2016-06-02 DIAGNOSIS — M79604 Pain in right leg: Secondary | ICD-10-CM

## 2016-06-02 DIAGNOSIS — M79605 Pain in left leg: Principal | ICD-10-CM

## 2016-06-02 DIAGNOSIS — R0989 Other specified symptoms and signs involving the circulatory and respiratory systems: Secondary | ICD-10-CM

## 2016-06-03 ENCOUNTER — Ambulatory Visit: Payer: Commercial Managed Care - HMO

## 2016-06-03 DIAGNOSIS — M79605 Pain in left leg: Secondary | ICD-10-CM

## 2016-06-03 DIAGNOSIS — R0989 Other specified symptoms and signs involving the circulatory and respiratory systems: Secondary | ICD-10-CM

## 2016-06-03 DIAGNOSIS — M79606 Pain in leg, unspecified: Secondary | ICD-10-CM

## 2016-06-03 DIAGNOSIS — M79604 Pain in right leg: Secondary | ICD-10-CM | POA: Diagnosis not present

## 2016-06-15 DIAGNOSIS — J449 Chronic obstructive pulmonary disease, unspecified: Secondary | ICD-10-CM | POA: Diagnosis not present

## 2016-06-16 DIAGNOSIS — J449 Chronic obstructive pulmonary disease, unspecified: Secondary | ICD-10-CM | POA: Diagnosis not present

## 2016-06-27 DIAGNOSIS — J449 Chronic obstructive pulmonary disease, unspecified: Secondary | ICD-10-CM | POA: Diagnosis not present

## 2016-06-27 DIAGNOSIS — I5032 Chronic diastolic (congestive) heart failure: Secondary | ICD-10-CM | POA: Diagnosis not present

## 2016-07-15 DIAGNOSIS — J449 Chronic obstructive pulmonary disease, unspecified: Secondary | ICD-10-CM | POA: Diagnosis not present

## 2016-07-16 DIAGNOSIS — J449 Chronic obstructive pulmonary disease, unspecified: Secondary | ICD-10-CM | POA: Diagnosis not present

## 2016-07-27 DIAGNOSIS — I5032 Chronic diastolic (congestive) heart failure: Secondary | ICD-10-CM | POA: Diagnosis not present

## 2016-07-27 DIAGNOSIS — J449 Chronic obstructive pulmonary disease, unspecified: Secondary | ICD-10-CM | POA: Diagnosis not present

## 2016-08-02 ENCOUNTER — Other Ambulatory Visit: Payer: Self-pay | Admitting: Family Medicine

## 2016-08-02 ENCOUNTER — Telehealth: Payer: Self-pay | Admitting: Family Medicine

## 2016-08-02 DIAGNOSIS — E782 Mixed hyperlipidemia: Secondary | ICD-10-CM

## 2016-08-02 DIAGNOSIS — J449 Chronic obstructive pulmonary disease, unspecified: Secondary | ICD-10-CM

## 2016-08-02 DIAGNOSIS — I5032 Chronic diastolic (congestive) heart failure: Secondary | ICD-10-CM

## 2016-08-02 DIAGNOSIS — R202 Paresthesia of skin: Secondary | ICD-10-CM

## 2016-08-02 MED ORDER — FLUTICASONE-SALMETEROL 250-50 MCG/DOSE IN AEPB: 1.0000 | INHALATION_SPRAY | Freq: Two times a day (BID) | RESPIRATORY_TRACT | 3 refills | Status: DC

## 2016-08-02 MED ORDER — FUROSEMIDE 40 MG PO TABS: 40.0000 mg | ORAL_TABLET | Freq: Every day | ORAL | 3 refills | Status: DC

## 2016-08-02 MED ORDER — METOPROLOL SUCCINATE ER 100 MG PO TB24: 100.0000 mg | ORAL_TABLET | Freq: Every day | ORAL | 3 refills | Status: DC

## 2016-08-02 MED ORDER — OMEPRAZOLE 40 MG PO CPDR: 40.0000 mg | DELAYED_RELEASE_CAPSULE | Freq: Every day | ORAL | 3 refills | Status: DC

## 2016-08-02 MED ORDER — GABAPENTIN 300 MG PO CAPS
300.0000 mg | ORAL_CAPSULE | Freq: Two times a day (BID) | ORAL | 3 refills | Status: DC
Start: 2016-08-02 — End: 2017-05-12

## 2016-08-02 MED ORDER — PRAVASTATIN SODIUM 20 MG PO TABS: 20.0000 mg | ORAL_TABLET | Freq: Every day | ORAL | 3 refills | Status: DC

## 2016-08-02 MED ORDER — TAMSULOSIN HCL 0.4 MG PO CAPS: 0.4000 mg | ORAL_CAPSULE | Freq: Every day | ORAL | 3 refills | Status: DC

## 2016-08-02 NOTE — Telephone Encounter (Signed)
Pt contacted office for refill request on the following medications:  [facility] mail order.  CB#[id number]/MW  Fluticasone-Salmeterol (ADVAIR DISKUS) 250-50 MCG/DOSE AEPB-pt is requesting samples if we have any  omeprazole (PRILOSEC) 40 MG capsule  furosemide (LASIX) 40 MG tablet  metoprolol succinate (TOPROL-XL) 100 MG 24 hr tablet  pravastatin (PRAVACHOL) 20 MG tablet  tamsulosin (FLOMAX) 0.4 MG CAPS capsule  gabapentin (NEURONTIN) 300 MG capsu

## 2016-08-02 NOTE — Telephone Encounter (Signed)
Medications refilled

## 2016-08-02 NOTE — Telephone Encounter (Signed)
Please review. We currently do not have samples of Advair. Thanks!

## 2016-08-12 ENCOUNTER — Ambulatory Visit (INDEPENDENT_AMBULATORY_CARE_PROVIDER_SITE_OTHER): Payer: Medicare HMO | Admitting: Cardiovascular Disease

## 2016-08-12 ENCOUNTER — Encounter: Payer: Self-pay | Admitting: Cardiovascular Disease

## 2016-08-12 VITALS — BP 134/69 | HR 84 | Ht 65.0 in | Wt 188.2 lb

## 2016-08-12 DIAGNOSIS — I2510 Atherosclerotic heart disease of native coronary artery without angina pectoris: Secondary | ICD-10-CM

## 2016-08-12 DIAGNOSIS — I471 Supraventricular tachycardia: Secondary | ICD-10-CM

## 2016-08-12 DIAGNOSIS — I10 Essential (primary) hypertension: Secondary | ICD-10-CM

## 2016-08-12 DIAGNOSIS — J219 Acute bronchiolitis, unspecified: Secondary | ICD-10-CM | POA: Diagnosis not present

## 2016-08-12 MED ORDER — LEVOFLOXACIN 500 MG PO TABS: 500.0000 mg | ORAL_TABLET | Freq: Every day | ORAL | 0 refills | Status: DC

## 2016-08-12 NOTE — Patient Instructions (Addendum)
Medication Instructions:  Your physician has recommended you make the following change in your medication:  START taking levaquin 500mg  once daily for 5 days. If symptoms do not improve, please notify your primary care physician   Labwork: none  Testing/Procedures: none  Follow-Up: Your physician wants you to follow-up in: 6 months with Dr. Fletcher Anon.  You will receive a reminder letter in the mail two months in advance. If you don't receive a letter, please call our office to schedule the follow-up appointment.   Any Other Special Instructions Will Be Listed Below (If Applicable).     If you need a refill on your cardiac medications before your next appointment, please call your pharmacy.

## 2016-08-12 NOTE — Progress Notes (Signed)
Cardiology Office Note   Date:  08/12/2016   ID:  Bobby Peterson, DOB 1940/08/10, MRN [id number]  PCP:  Lelon Huh, MD  Cardiologist:   Kathlyn Sacramento, MD   Chief Complaint  Patient presents with  . other    3 month f/u c/o coughing. Meds reviewed verbally with pt.      History of Present Illness: Bobby Peterson is a 76 y.o. male who presents for a follow-up visit regarding multifocal atrial tachycardia and coronary artery disease. He has known history of coronary artery disease on remote cardiac cath being managed medically, chronic respiratory failure on home oxygen due to COPD from previous tobacco use, renal cell cancer status post nephrectomy, essential hypertension, diabetes mellitus and obesity. He was hospitalized in September with respiratory failure which required intubation. He was found to have pneumonia and was treated. He developed multifocal atrial tachycardia which was treated with rate control with diltiazem and metoprolol. He had an echocardiogram done which showed normal LV systolic function, mild aortic regurgitation, mildly dilated left atrium and mild pulmonary hypertension. During last visit I switched diltiazem to twice daily and stop losartan due to low blood pressure. He complained of bilateral leg pain. ABI was normal. He has been doing reasonably well but over the last few days he started having cough productive of whitish and yellowish sputum. The cough has been preventing him from sleeping well at night. No chest pain. He reports slight worsening of dyspnea.   Past Medical History:  Diagnosis Date  . Acute respiratory failure with hypoxia ([place]) 12/03/2013   Overview:  Overview:  2 L O2 continuously  Last Assessment & Plan:  Continue 2 L O2 continuously   . Chronic diastolic CHF (congestive heart failure) ([place])   . Chronic respiratory failure (HCC)    a. on home O2  . COPD (chronic obstructive pulmonary disease) ([place])   . Coronary artery disease    . Diabetes ([place])   . Hernia, umbilical   . Hypercholesteremia   . Hypertension   . Obesity   . Renal cancer [facility])    a. s/p nephrectomy in 1980's    Past Surgical History:  Procedure Laterality Date  . CHOLECYSTECTOMY N/A 12/22/2014   Procedure: LAPAROSCOPIC CHOLECYSTECTOMY WITH INTRAOPERATIVE CHOLANGIOGRAM;  Surgeon: Christene Lye, MD;  Location: [facility] ORS;  Service: General;  Laterality: N/A;  . COLONOSCOPY    . ERCP N/A 01/05/2015   Procedure: ENDOSCOPIC RETROGRADE CHOLANGIOPANCREATOGRAPHY (ERCP);  Surgeon: Hulen Luster, MD;  Location: [facility] ENDOSCOPY;  Service: Endoscopy;  Laterality: N/A;  . ERCP N/A 02/19/2015   Procedure: ENDOSCOPIC RETROGRADE CHOLANGIOPANCREATOGRAPHY (ERCP);  Surgeon: Hulen Luster, MD;  Location: [facility] ENDOSCOPY;  Service: Gastroenterology;  Laterality: N/A;  . GALLBLADDER SURGERY  11/2014  . NEPHRECTOMY  1990  . OMENTECTOMY  12/22/2014   Procedure: OMENTECTOMY;  Surgeon: Christene Lye, MD;  Location: [facility] ORS;  Service: General;;  Partial omentectomy  . STENT REMOVAL    . UMBILICAL HERNIA REPAIR N/A 12/22/2014   Procedure: HERNIA REPAIR UMBILICAL ADULT;  Surgeon: Christene Lye, MD;  Location: [facility] ORS;  Service: General;  Laterality: N/A;     Current Outpatient Prescriptions  Medication Sig Dispense Refill  . albuterol-ipratropium (COMBIVENT) 18-103 MCG/ACT inhaler Inhale 1-2 puffs into the lungs every 6 (six) hours as needed for wheezing or shortness of breath. 1 Inhaler 2  . aspirin 81 MG tablet Take 81 mg by mouth daily.    . Fluticasone-Salmeterol (ADVAIR DISKUS) 250-50 MCG/DOSE  AEPB Inhale 1 puff into the lungs 2 (two) times daily. 180 each 3  . furosemide (LASIX) 40 MG tablet Take 1 tablet (40 mg total) by mouth daily. 90 tablet 3  . gabapentin (NEURONTIN) 300 MG capsule Take 1 capsule (300 mg total) by mouth 2 (two) times daily. 180 capsule 3  . metoprolol succinate (TOPROL-XL) 100 MG 24 hr tablet Take 1 tablet (100 mg total) by mouth  daily. Take with or immediately following a meal. 90 tablet 3  . omeprazole (PRILOSEC) 40 MG capsule Take 1 capsule (40 mg total) by mouth daily before breakfast. 90 capsule 3  . OXYGEN Inhale 2 L/min into the lungs continuous.     . pravastatin (PRAVACHOL) 20 MG tablet Take 1 tablet (20 mg total) by mouth daily. 90 tablet 3  . tamsulosin (FLOMAX) 0.4 MG CAPS capsule Take 1 capsule (0.4 mg total) by mouth daily. 90 capsule 3  . vitamin B-12 (CYANOCOBALAMIN) 1000 MCG tablet Take 1,000 mcg by mouth once a week. *Take on Monday*    . levofloxacin (LEVAQUIN) 500 MG tablet Take 1 tablet (500 mg total) by mouth daily. 5 tablet 0   No current facility-administered medications for this visit.     Allergies:   Fenofibrate; Lisinopril; Niacin; Phenergan [promethazine hcl]; and Simvastatin    Social History:  The patient  reports that he quit smoking about 12 years ago. His smoking use included Cigarettes. He has a 50.00 pack-year smoking history. His smokeless tobacco use includes Chew. He reports that he does not drink alcohol or use drugs.   Family History:  The patient's family history includes Hypertension in his mother.    ROS:  Please see the history of present illness.   Otherwise, review of systems are positive for none.   All other systems are reviewed and negative.    PHYSICAL EXAM: VS:  BP 134/69 (BP Location: Left Arm, Patient Position: Sitting, Cuff Size: Normal)   Pulse 84   Ht 5\' 5"  (1.651 m)   Wt 188 lb 4 oz (85.4 kg)   BMI 31.33 kg/m  , BMI Body mass index is 31.33 kg/m. GEN: Well nourished, well developed, in no acute distress  HEENT: normal  Neck: no JVD, carotid bruits, or masses Cardiac: RRR; no murmurs, rubs, or gallops,no edema  Respiratory:   Diminished breath sounds at the bases bilaterally with bronchial breath sounds, normal work of breathing GI: soft, nontender, nondistended, + BS MS: no deformity or atrophy  Skin: warm and dry, no rash Neuro:  Strength and  sensation are intact Psych: euthymic mood, full affect   EKG:  EKG is ordered today. The ekg ordered today demonstrates normal sinus rhythm with sinus arrhythmia. Inferolateral ST depression suggestive of ischemia   Recent Labs: 04/21/2016: ALT 16; B Natriuretic Peptide 41.0; TSH 1.556 04/26/2016: BUN 35; Creatinine, Ser 1.53; Hemoglobin 14.4; Platelets 255; Sodium 142 04/27/2016: Magnesium 2.1; Potassium 4.3    Lipid Panel No results found for: CHOL, TRIG, HDL, CHOLHDL, VLDL, LDLCALC, LDLDIRECT    Wt Readings from Last 3 Encounters:  08/12/16 188 lb 4 oz (85.4 kg)  05/12/16 180 lb 8 oz (81.9 kg)  05/10/16 183 lb (83 kg)     No flowsheet data found.    ASSESSMENT AND PLAN:  1.  Multifocal atrial tachycardia: Was likely related to his lung disease.  He is currently on metoprolol and diltiazem. No changes made. No evidence of recurrent arrhythmia.  2. Coronary artery disease involving native coronary arteries without  angina: The patient currently denies anginal symptoms. Continue medical therapy. His EKG is abnormal with ST depression that has no convincing symptoms of angina.  3. Bilateral leg pain : Likely due to neuropathy. ABI was normal. He reports resolution of symptoms.   4. Essential hypertension:Blood pressure is controlled.  5. Acute bronchitis: The patient has symptoms highly suggestive of acute bronchitis. Given his extensive lung disease, I prescribed Levaquin 500 mg once daily for 5 days. His respiratory status appears to be stable at the present time. I asked him to follow-up with his primary care physician if there is no improvement.   Disposition:   FU with me in 6 months  Signed,  Kathlyn Sacramento, MD  08/12/2016 10:48 AM    [place]

## 2016-08-15 DIAGNOSIS — J449 Chronic obstructive pulmonary disease, unspecified: Secondary | ICD-10-CM | POA: Diagnosis not present

## 2016-08-16 DIAGNOSIS — J449 Chronic obstructive pulmonary disease, unspecified: Secondary | ICD-10-CM | POA: Diagnosis not present

## 2016-08-22 ENCOUNTER — Ambulatory Visit (INDEPENDENT_AMBULATORY_CARE_PROVIDER_SITE_OTHER): Payer: Medicare HMO | Admitting: Family Medicine

## 2016-08-22 ENCOUNTER — Encounter: Payer: Self-pay | Admitting: Family Medicine

## 2016-08-22 VITALS — BP 120/56 | HR 87 | Temp 98.4°F | Resp 17 | Wt 190.6 lb

## 2016-08-22 DIAGNOSIS — S93401A Sprain of unspecified ligament of right ankle, initial encounter: Secondary | ICD-10-CM

## 2016-08-22 DIAGNOSIS — J432 Centrilobular emphysema: Secondary | ICD-10-CM | POA: Diagnosis not present

## 2016-08-22 MED ORDER — ALBUTEROL SULFATE HFA 108 (90 BASE) MCG/ACT IN AERS
2.0000 | INHALATION_SPRAY | Freq: Four times a day (QID) | RESPIRATORY_TRACT | 2 refills | Status: DC | PRN
Start: 2016-08-22 — End: 2017-08-02

## 2016-08-22 NOTE — Patient Instructions (Addendum)
Let me know if sputum becomes yellow or green with increased shortness of breath. Try the albuterol every 4-6 hours as needed.

## 2016-08-22 NOTE — Progress Notes (Signed)
Subjective:     Patient ID: Bobby Peterson, male   DOB: Apr 14, 1941, 76 y.o.   MRN: [id number]  HPI  Chief Complaint  Patient presents with  . Cough    Patient comes in office today with complaints of productive cough for the past week or more. Patient states that his cough is productive of white phlegm and when he saw cardiologist n 08/12/16 he prescribed Levofloxacin 500mg . Patient reports symptoms have not improved.   Was treated for acute bronchitis 1/12 x 5 days for what was felt to be acute bronchitis. Bobby Peterson states he improved at that time. Now reports increased production of white sputum but no associated shortness of breath (acutally tolerate his oxygent being off during his visit) or URI sx. Reports compliance with Advair but has no rescue inhaler. Can not afford albuterol nebs and states Advair is very expensive as well. Not seeing a pulmonary doctor at this time. Accompanied by his wife today. Also states his right ankle is periodically sore.   Review of Systems     Objective:   Physical Exam  Constitutional: He appears well-developed and well-nourished. No distress.  Cardiovascular:  Pulses:      Dorsalis pedis pulses are 2+ on the right side.       Posterior tibial pulses are 2+ on the right side.  Pulmonary/Chest:  Bilateral bases with coarse end expiratory sounds o/w clear with diminished breath sounds.  Musculoskeletal:  Right lateral malleolar tenderness c/w sprain. DF/PF 5/5       Assessment:    1. Centrilobular emphysema (HCC) - albuterol (PROVENTIL HFA;VENTOLIN HFA) 108 (90 Base) MCG/ACT inhaler; Inhale 2 puffs into the lungs every 6 (six) hours as needed for wheezing or shortness of breath.  Dispense: 1 Inhaler; Refill: 2  2. Sprain of right ankle, unspecified ligament, initial encounter    Plan:    Will call for purulent sputum or increased shortness of breath. Discussed use of ankle sleeve. Provided contact information for the [facility]

## 2016-08-25 ENCOUNTER — Telehealth: Payer: Self-pay | Admitting: Family Medicine

## 2016-08-25 NOTE — Telephone Encounter (Signed)
Have her schedule an appointment for tomorrow. If they can't wait  go to Urgent Care. Tylenol for pain. Has he had gout before?

## 2016-08-25 NOTE — Telephone Encounter (Signed)
Patient was in office 3 days ago please review chart and advise.

## 2016-08-25 NOTE — Telephone Encounter (Signed)
Pt's wife states his right foot is still swollen and has fever in it.  She states it is red as well.  She wants to know what to do.  Pt's wife states he was seen in the office by Mikki Santee for this problem.

## 2016-08-25 NOTE — Telephone Encounter (Signed)
Appt scheduled for morning at [facility]

## 2016-08-26 ENCOUNTER — Encounter: Payer: Self-pay | Admitting: Family Medicine

## 2016-08-26 ENCOUNTER — Ambulatory Visit (INDEPENDENT_AMBULATORY_CARE_PROVIDER_SITE_OTHER): Payer: Medicare HMO | Admitting: Family Medicine

## 2016-08-26 VITALS — BP 116/56 | HR 65 | Temp 97.7°F | Resp 16 | Wt 191.4 lb

## 2016-08-26 DIAGNOSIS — M109 Gout, unspecified: Secondary | ICD-10-CM

## 2016-08-26 MED ORDER — PREDNISONE 20 MG PO TABS: ORAL_TABLET | ORAL | 0 refills | Status: DC

## 2016-08-26 NOTE — Patient Instructions (Addendum)
We will call you with the lab results. Gout Introduction Gout is painful swelling that can happen in some of your joints. Gout is a type of arthritis. This condition is caused by having too much uric acid in your body. Uric acid is a chemical that is made when your body breaks down substances called purines. If your body has too much uric acid, sharp crystals can form and build up in your joints. This causes pain and swelling. Gout attacks can happen quickly and be very painful (acute gout). Over time, the attacks can affect more joints and happen more often (chronic gout). Follow these instructions at home: During a Gout Attack  If directed, put ice on the painful area:  Put ice in a plastic bag.  Place a towel between your skin and the bag.  Leave the ice on for 20 minutes, 2-3 times a day.  Rest the joint as much as possible. If the joint is in your leg, you may be given crutches to use.  Raise (elevate) the painful joint above the level of your heart as often as you can.  Drink enough fluids to keep your pee (urine) clear or pale yellow.  Take over-the-counter and prescription medicines only as told by your doctor.  Do not drive or use heavy machinery while taking prescription pain medicine.  Follow instructions from your doctor about what you can or cannot eat and drink.  Return to your normal activities as told by your doctor. Ask your doctor what activities are safe for you. Avoiding Future Gout Attacks  Follow a low-purine diet as told by a specialist (dietitian) or your doctor. Avoid foods and drinks that have a lot of purines, such as:  Liver.  Kidney.  Anchovies.  Asparagus.  Herring.  Mushrooms  Mussels.  Beer.  Limit alcohol intake to no more than 1 drink a day for nonpregnant women and 2 drinks a day for men. One drink equals 12 oz of beer, 5 oz of wine, or 1 oz of hard liquor.  Stay at a healthy weight or lose weight if you are overweight. If you  want to lose weight, talk with your doctor. It is important that you do not lose weight too fast.  Start or continue an exercise plan as told by your doctor.  Drink enough fluids to keep your pee clear or pale yellow.  Take over-the-counter and prescription medicines only as told by your doctor.  Keep all follow-up visits as told by your doctor. This is important. Contact a doctor if:  You have another gout attack.  You still have symptoms of a gout attack after10 days of treatment.  You have problems (side effects) because of your medicines.  You have chills or a fever.  You have burning pain when you pee (urinate).  You have pain in your lower back or belly. Get help right away if:  You have very bad pain.  Your pain cannot be controlled.  You cannot pee. This information is not intended to replace advice given to you by your health care provider. Make sure you discuss any questions you have with your health care provider. Document Released: 04/26/2008 Document Revised: 12/24/2015 Document Reviewed: 04/30/2015  2017 [employer]

## 2016-08-26 NOTE — Progress Notes (Signed)
Subjective:     Patient ID: Bobby Peterson, male   DOB: [birth year], 76 y.o.   MRN: [id number]  HPI  Chief Complaint  Patient presents with  . Foot Pain    Patient comes in office today with concerns of right foot pain for greater than one week. Patients wife who is accompanied with him today states that foot began to swell after office visit on 08/22/16 and states that foot is hot to touch and red.   When seen earlier this week was tender around right lateral malleolus but no redness or swelling. Bobby Peterson states he had redness over the top of his foot last night but now his great toe is sore.   Review of Systems     Objective:   Physical Exam  Constitutional: He appears well-developed and well-nourished. No distress.  Skin:  Right first MTP joint erythematous with pain on palpation of his great toe. Mild swelling over the dorsum of his foot. Lateral malleolus not consistently tender.       Assessment:    1. 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

## 2016-08-27 DIAGNOSIS — I5032 Chronic diastolic (congestive) heart failure: Secondary | ICD-10-CM | POA: Diagnosis not present

## 2016-08-27 DIAGNOSIS — J449 Chronic obstructive pulmonary disease, unspecified: Secondary | ICD-10-CM | POA: Diagnosis not present

## 2016-08-27 LAB — CBC WITH DIFFERENTIAL/PLATELET
BASOS ABS: 0 10*3/uL (ref 0.0–0.2)
Basos: 0 %
EOS (ABSOLUTE): 0.3 10*3/uL (ref 0.0–0.4)
Eos: 3 %
HEMOGLOBIN: 14 g/dL (ref 13.0–17.7)
Hematocrit: 43.2 % (ref 37.5–51.0)
IMMATURE GRANS (ABS): 0 10*3/uL (ref 0.0–0.1)
Immature Granulocytes: 0 %
Lymphocytes Absolute: 2.6 10*3/uL (ref 0.7–3.1)
Lymphs: 27 %
MCH: 27.8 pg (ref 26.6–33.0)
MCHC: 32.4 g/dL (ref 31.5–35.7)
MCV: 86 fL (ref 79–97)
MONOCYTES: 8 %
Monocytes Absolute: 0.8 10*3/uL (ref 0.1–0.9)
Neutrophils Absolute: 6 10*3/uL (ref 1.4–7.0)
Neutrophils: 62 %
Platelets: 363 10*3/uL (ref 150–379)
RBC: 5.04 x10E6/uL (ref 4.14–5.80)
RDW: 15.5 % — ABNORMAL HIGH (ref 12.3–15.4)
WBC: 9.7 10*3/uL (ref 3.4–10.8)

## 2016-08-27 LAB — RENAL FUNCTION PANEL
Albumin: 3.7 g/dL (ref 3.5–4.8)
BUN/Creatinine Ratio: 8 — ABNORMAL LOW (ref 10–24)
BUN: 13 mg/dL (ref 8–27)
CALCIUM: 9.2 mg/dL (ref 8.6–10.2)
CO2: 27 mmol/L (ref 18–29)
Chloride: 101 mmol/L (ref 96–106)
Creatinine, Ser: 1.66 mg/dL — ABNORMAL HIGH (ref 0.76–1.27)
GFR calc Af Amer: 45 mL/min/{1.73_m2} — ABNORMAL LOW (ref >59)
GFR, EST NON AFRICAN AMERICAN: 39 mL/min/{1.73_m2} — AB (ref >59)
GLUCOSE: 124 mg/dL — AB (ref 65–99)
PHOSPHORUS: 3.5 mg/dL (ref 2.5–4.5)
POTASSIUM: 4.6 mmol/L (ref 3.5–5.2)
SODIUM: 143 mmol/L (ref 134–144)

## 2016-08-27 LAB — URIC ACID: URIC ACID: 9.5 mg/dL — AB (ref 3.7–8.6)

## 2016-08-29 ENCOUNTER — Telehealth: Payer: Self-pay

## 2016-08-29 NOTE — Telephone Encounter (Signed)
-----   Message from Carmon Ginsberg, [place] sent at 08/27/2016  9:02 AM EST ----- Labs ok except uric level high. Would like to recheck this level after he has improved. Just call for lab slip.

## 2016-08-29 NOTE — Telephone Encounter (Signed)
Patient has been advised. KW 

## 2016-09-05 ENCOUNTER — Other Ambulatory Visit: Payer: Self-pay

## 2016-09-05 ENCOUNTER — Other Ambulatory Visit: Payer: Self-pay | Admitting: Family Medicine

## 2016-09-05 DIAGNOSIS — J440 Chronic obstructive pulmonary disease with acute lower respiratory infection: Secondary | ICD-10-CM

## 2016-09-05 DIAGNOSIS — M109 Gout, unspecified: Secondary | ICD-10-CM

## 2016-09-05 DIAGNOSIS — J209 Acute bronchitis, unspecified: Secondary | ICD-10-CM

## 2016-09-05 MED ORDER — AZITHROMYCIN 250 MG PO TABS: ORAL_TABLET | ORAL | 0 refills | Status: DC

## 2016-09-06 ENCOUNTER — Telehealth: Payer: Self-pay

## 2016-09-06 LAB — URIC ACID: URIC ACID: 7.7 mg/dL (ref 3.7–8.6)

## 2016-09-06 NOTE — Telephone Encounter (Signed)
Patients wife was advised. KW 

## 2016-09-06 NOTE — Telephone Encounter (Signed)
-----   Message from [place], [place] sent at 09/06/2016  7:36 AM EST ----- Uric acid level is back down to normal range. If he has further gout attacks may need daily medication to keep his levels even lower.

## 2016-09-12 ENCOUNTER — Telehealth: Payer: Self-pay | Admitting: Family Medicine

## 2016-09-12 ENCOUNTER — Other Ambulatory Visit: Payer: Self-pay | Admitting: Family Medicine

## 2016-09-12 DIAGNOSIS — J42 Unspecified chronic bronchitis: Secondary | ICD-10-CM

## 2016-09-12 MED ORDER — UMECLIDINIUM BROMIDE 62.5 MCG/INH IN AEPB: 1.0000 | INHALATION_SPRAY | Freq: Every day | RESPIRATORY_TRACT | 5 refills | Status: DC

## 2016-09-12 NOTE — Telephone Encounter (Signed)
Please review office note and advise if patient needs to return to office? KW

## 2016-09-12 NOTE — Telephone Encounter (Signed)
Pt's wife called saying Mr.  Reagan finished his zpak Friday but is still having a lot of symptoms.  Coughing up phelme.  They want to know if something else can be called in./  They use [facility] [address] [facility]  Their [phone]  Thanks, [name]

## 2016-09-12 NOTE — Telephone Encounter (Signed)
Continues to have frequent cough with white sputum after completing Zithromax. Currently on Advair and Combivent. Will change to LAMA from Combivent if insurance will cover.

## 2016-09-15 DIAGNOSIS — J449 Chronic obstructive pulmonary disease, unspecified: Secondary | ICD-10-CM | POA: Diagnosis not present

## 2016-09-16 DIAGNOSIS — J449 Chronic obstructive pulmonary disease, unspecified: Secondary | ICD-10-CM | POA: Diagnosis not present

## 2016-09-27 DIAGNOSIS — J449 Chronic obstructive pulmonary disease, unspecified: Secondary | ICD-10-CM | POA: Diagnosis not present

## 2016-09-27 DIAGNOSIS — I5032 Chronic diastolic (congestive) heart failure: Secondary | ICD-10-CM | POA: Diagnosis not present

## 2016-09-28 ENCOUNTER — Other Ambulatory Visit: Payer: Self-pay | Admitting: Family Medicine

## 2016-09-28 ENCOUNTER — Telehealth: Payer: Self-pay

## 2016-09-28 DIAGNOSIS — M109 Gout, unspecified: Secondary | ICD-10-CM

## 2016-09-28 MED ORDER — PREDNISONE 20 MG PO TABS: ORAL_TABLET | ORAL | 0 refills | Status: DC

## 2016-09-28 NOTE — Telephone Encounter (Signed)
I have sent in prednisone again. Once this flare is over, have [name] call me to start a daily gout prevention medication.

## 2016-09-28 NOTE — Telephone Encounter (Signed)
Patient's wife called and states patient is having a goutf flare in right foot. She states Mikki Santee told patient to call if he had a gout flare for a medication refill. Patient uses [facility] on [address] CB#[phone].

## 2016-09-28 NOTE — Telephone Encounter (Signed)
Called back Bobby Peterson was not available, advised patient but advised him to get Bobby Peterson to call us if she needs to understand the information below better-aa

## 2016-10-06 ENCOUNTER — Telehealth: Payer: Self-pay

## 2016-10-06 NOTE — Telephone Encounter (Signed)
Mrs. Boudoin called office to request samples of Advair for her husband. Renaldo Fiddler, CMA

## 2016-10-07 ENCOUNTER — Other Ambulatory Visit: Payer: Self-pay | Admitting: Family Medicine

## 2016-10-07 ENCOUNTER — Encounter: Payer: Self-pay | Admitting: Family Medicine

## 2016-10-07 NOTE — Progress Notes (Signed)
Can have sample adviar 250 if we have one.   Samples up front for pick up.

## 2016-10-07 NOTE — Telephone Encounter (Signed)
Is it okay to give samples?  Thanks,   -Mickel Baas

## 2016-10-13 DIAGNOSIS — J449 Chronic obstructive pulmonary disease, unspecified: Secondary | ICD-10-CM | POA: Diagnosis not present

## 2016-10-14 DIAGNOSIS — J449 Chronic obstructive pulmonary disease, unspecified: Secondary | ICD-10-CM | POA: Diagnosis not present

## 2016-10-25 DIAGNOSIS — J449 Chronic obstructive pulmonary disease, unspecified: Secondary | ICD-10-CM | POA: Diagnosis not present

## 2016-10-25 DIAGNOSIS — I5032 Chronic diastolic (congestive) heart failure: Secondary | ICD-10-CM | POA: Diagnosis not present

## 2016-11-04 ENCOUNTER — Telehealth: Payer: Self-pay | Admitting: Family Medicine

## 2016-11-04 NOTE — Telephone Encounter (Signed)
Plenty of samples. Ok to give to pt? Renaldo Fiddler, CMA

## 2016-11-04 NOTE — Telephone Encounter (Signed)
Mrs Stidd advised. Lot# [id number]. Expires 12/2016. Renaldo Fiddler, CMA

## 2016-11-04 NOTE — Telephone Encounter (Signed)
Pt wife is requesting samples for Fluticasone-Salmeterol (ADVAIR DISKUS) 250-50 MCG/DOSE AEPB ° °CB#[phone]/MW °

## 2016-11-04 NOTE — Telephone Encounter (Signed)
Ok to give samples x 2

## 2016-11-13 DIAGNOSIS — J449 Chronic obstructive pulmonary disease, unspecified: Secondary | ICD-10-CM | POA: Diagnosis not present

## 2016-11-14 DIAGNOSIS — J449 Chronic obstructive pulmonary disease, unspecified: Secondary | ICD-10-CM | POA: Diagnosis not present

## 2016-11-24 ENCOUNTER — Telehealth: Payer: Self-pay | Admitting: Family Medicine

## 2016-11-24 NOTE — Telephone Encounter (Signed)
Pt wants toknow if we have sample of the Advir.  Please call so she can pick up [phone]  Thanks teri

## 2016-11-24 NOTE — Telephone Encounter (Signed)
Patient was advised and samples left for pick up. KW

## 2016-11-24 NOTE — Telephone Encounter (Signed)
We have available samples, how many would you like for me to leave for patient to pick up? KW

## 2016-11-24 NOTE — Telephone Encounter (Signed)
Two boxes

## 2016-11-25 DIAGNOSIS — I5032 Chronic diastolic (congestive) heart failure: Secondary | ICD-10-CM | POA: Diagnosis not present

## 2016-11-25 DIAGNOSIS — J449 Chronic obstructive pulmonary disease, unspecified: Secondary | ICD-10-CM | POA: Diagnosis not present

## 2016-12-01 ENCOUNTER — Telehealth: Payer: Self-pay | Admitting: Family Medicine

## 2016-12-01 NOTE — Telephone Encounter (Signed)
Pt needs samples of    Fluticasone-Salmeterol (ADVAIR DISKUS) 250-50 MCG/DOSE AEPB  Taking 08/02/16 -- Carmon Ginsberg, PA   Inhale 1 puff into the lungs 2 (two) times daily.   They are in the "donut hole".  He is about to run out.    Call back [phone]  Thanks, [name]

## 2016-12-01 NOTE — Telephone Encounter (Signed)
We last gave 2 box samples to patient on 4/26 how any would you like to leave for patient this time? KW

## 2016-12-01 NOTE — Telephone Encounter (Signed)
Patient has been advised. KW 

## 2016-12-01 NOTE — Telephone Encounter (Signed)
Four boxes

## 2016-12-13 DIAGNOSIS — J449 Chronic obstructive pulmonary disease, unspecified: Secondary | ICD-10-CM | POA: Diagnosis not present

## 2016-12-14 DIAGNOSIS — J449 Chronic obstructive pulmonary disease, unspecified: Secondary | ICD-10-CM | POA: Diagnosis not present

## 2016-12-25 DIAGNOSIS — J449 Chronic obstructive pulmonary disease, unspecified: Secondary | ICD-10-CM | POA: Diagnosis not present

## 2016-12-25 DIAGNOSIS — I5032 Chronic diastolic (congestive) heart failure: Secondary | ICD-10-CM | POA: Diagnosis not present

## 2016-12-28 ENCOUNTER — Telehealth: Payer: Self-pay

## 2016-12-28 NOTE — Telephone Encounter (Signed)
Ok for samples?

## 2016-12-28 NOTE — Telephone Encounter (Signed)
Wife was advised samples left for pick up. KW

## 2016-12-28 NOTE — Telephone Encounter (Signed)
We have two boxes left okay to leave sample? KW

## 2016-12-28 NOTE — Telephone Encounter (Signed)
Patient's wife called requesting samples of Advair 200-40mcg.     CB#[phone]

## 2017-01-13 DIAGNOSIS — J449 Chronic obstructive pulmonary disease, unspecified: Secondary | ICD-10-CM | POA: Diagnosis not present

## 2017-01-14 DIAGNOSIS — J449 Chronic obstructive pulmonary disease, unspecified: Secondary | ICD-10-CM | POA: Diagnosis not present

## 2017-01-16 ENCOUNTER — Telehealth: Payer: Self-pay | Admitting: Family Medicine

## 2017-01-16 NOTE — Telephone Encounter (Signed)
Pt wife is requesting sample for Fluticasone-Salmeterol (ADVAIR DISKUS) 250-50 MCG/DOSE AEPB.  CB#[phone]/MW

## 2017-01-17 NOTE — Telephone Encounter (Signed)
Patient advised that two samples would be available for pick up at front desk. KW

## 2017-01-25 DIAGNOSIS — J449 Chronic obstructive pulmonary disease, unspecified: Secondary | ICD-10-CM | POA: Diagnosis not present

## 2017-01-25 DIAGNOSIS — I5032 Chronic diastolic (congestive) heart failure: Secondary | ICD-10-CM | POA: Diagnosis not present

## 2017-02-02 ENCOUNTER — Telehealth: Payer: Self-pay | Admitting: Family Medicine

## 2017-02-02 NOTE — Telephone Encounter (Signed)
Please review-aa 

## 2017-02-02 NOTE — Telephone Encounter (Signed)
Pt wife called to request samples of the Rx Fluticasone-Salmeterol (ADVAIR DISKUS) 250-50 MCG/DOSE AEPB.  CB#[phone]/MW

## 2017-02-02 NOTE — Telephone Encounter (Signed)
Ok if we have any. 

## 2017-02-02 NOTE — Telephone Encounter (Signed)
Have 1 sample left, patient advised and sample left up front-aa

## 2017-02-12 DIAGNOSIS — J449 Chronic obstructive pulmonary disease, unspecified: Secondary | ICD-10-CM | POA: Diagnosis not present

## 2017-02-13 DIAGNOSIS — J449 Chronic obstructive pulmonary disease, unspecified: Secondary | ICD-10-CM | POA: Diagnosis not present

## 2017-02-24 DIAGNOSIS — I5032 Chronic diastolic (congestive) heart failure: Secondary | ICD-10-CM | POA: Diagnosis not present

## 2017-02-24 DIAGNOSIS — J449 Chronic obstructive pulmonary disease, unspecified: Secondary | ICD-10-CM | POA: Diagnosis not present

## 2017-03-08 ENCOUNTER — Telehealth: Payer: Self-pay | Admitting: Family Medicine

## 2017-03-08 NOTE — Telephone Encounter (Signed)
Pt wife is requesting samples for the Rx Fluticasone-Salmeterol (ADVAIR DISKUS) 250-50 MCG/DOSE AEPB  .  CB#[phone]/MW

## 2017-03-08 NOTE — Telephone Encounter (Signed)
Patients wife was advised that we are out of samples, encouraged that she call back at end of week to see if samples were in. [place]

## 2017-03-10 ENCOUNTER — Telehealth: Payer: Self-pay

## 2017-03-10 NOTE — Telephone Encounter (Signed)
Wife was advised. KW 

## 2017-03-10 NOTE — Telephone Encounter (Signed)
Wife called [name], asking about Advair samples 250-50. Please let them know-aa

## 2017-03-10 NOTE — Telephone Encounter (Signed)
Ok if we have them

## 2017-03-15 DIAGNOSIS — J449 Chronic obstructive pulmonary disease, unspecified: Secondary | ICD-10-CM | POA: Diagnosis not present

## 2017-03-16 DIAGNOSIS — J449 Chronic obstructive pulmonary disease, unspecified: Secondary | ICD-10-CM | POA: Diagnosis not present

## 2017-03-21 ENCOUNTER — Telehealth: Payer: Self-pay | Admitting: Family Medicine

## 2017-03-21 NOTE — Telephone Encounter (Signed)
Pt wife called states pt is having right foot.  Pt wife is requesting a Rx to treat gout.  [place]  CB#[phone]/MW

## 2017-03-21 NOTE — Telephone Encounter (Signed)
Patient called back regarding message below. CB#[phone] cell

## 2017-03-22 ENCOUNTER — Other Ambulatory Visit: Payer: Self-pay | Admitting: Family Medicine

## 2017-03-22 DIAGNOSIS — M109 Gout, unspecified: Secondary | ICD-10-CM

## 2017-03-22 MED ORDER — PREDNISONE 20 MG PO TABS: ORAL_TABLET | ORAL | 0 refills | Status: DC

## 2017-03-22 NOTE — Telephone Encounter (Signed)
She has refills-she usually wants samples which is ok if we have some.

## 2017-03-22 NOTE — Telephone Encounter (Signed)
I have sent in prednisone to [facility] which they have used previously. Is she switching to [employer]?

## 2017-03-22 NOTE — Telephone Encounter (Signed)
Patient advised RX has been sent to pharmacy and Advair samples are available for pick up at the front desk.

## 2017-03-22 NOTE — Telephone Encounter (Signed)
Pt called back and I verified she is wanting the [employer].  I also told her it should be ready for pick up.   Pt is also requesting refills for Advair.

## 2017-04-12 ENCOUNTER — Telehealth: Payer: Self-pay | Admitting: Family Medicine

## 2017-04-12 NOTE — Telephone Encounter (Signed)
Pt wife called to ask for samples of Fluticasone-Salmeterol (ADVAIR DISKUS) 250-50 MCG/DOSE AEPB   CB#[phone]/MW

## 2017-04-12 NOTE — Telephone Encounter (Signed)
Please review. Thank Edrick Kins, RMA

## 2017-04-13 NOTE — Telephone Encounter (Signed)
Ok if we have any. 

## 2017-04-13 NOTE — Telephone Encounter (Signed)
Informed patient that we are currently out of samples.KW

## 2017-04-15 DIAGNOSIS — J449 Chronic obstructive pulmonary disease, unspecified: Secondary | ICD-10-CM | POA: Diagnosis not present

## 2017-04-16 DIAGNOSIS — J449 Chronic obstructive pulmonary disease, unspecified: Secondary | ICD-10-CM | POA: Diagnosis not present

## 2017-04-20 ENCOUNTER — Telehealth: Payer: Self-pay | Admitting: Family Medicine

## 2017-04-20 NOTE — Telephone Encounter (Signed)
Pt's wife called wanting to know if we have any samples of Advair.  Her call back is [phone]  Thanks teri

## 2017-04-21 NOTE — Telephone Encounter (Signed)
We do have samples, provided 2. Patient and his wife advised, samples placed up front-Bobby Peterson V Bobby Peterson, RMA

## 2017-04-21 NOTE — Telephone Encounter (Signed)
Ok of we have samples of his dose strength.

## 2017-05-12 ENCOUNTER — Other Ambulatory Visit: Payer: Self-pay | Admitting: Family Medicine

## 2017-05-12 DIAGNOSIS — R202 Paresthesia of skin: Secondary | ICD-10-CM

## 2017-05-12 DIAGNOSIS — I5032 Chronic diastolic (congestive) heart failure: Secondary | ICD-10-CM

## 2017-05-12 DIAGNOSIS — E782 Mixed hyperlipidemia: Secondary | ICD-10-CM

## 2017-05-15 DIAGNOSIS — J449 Chronic obstructive pulmonary disease, unspecified: Secondary | ICD-10-CM | POA: Diagnosis not present

## 2017-05-16 DIAGNOSIS — J449 Chronic obstructive pulmonary disease, unspecified: Secondary | ICD-10-CM | POA: Diagnosis not present

## 2017-05-22 ENCOUNTER — Telehealth: Payer: Self-pay | Admitting: Family Medicine

## 2017-05-22 NOTE — Telephone Encounter (Signed)
ok 

## 2017-05-22 NOTE — Telephone Encounter (Signed)
Pt wife is requesting samples for Fluticasone-Salmeterol (ADVAIR DISKUS) 250-50 MCG/DOSE AEPB  CB#[phone]/MW

## 2017-05-22 NOTE — Telephone Encounter (Signed)
Left detailed voicemail letting patient know is ready for pick up

## 2017-05-22 NOTE — Telephone Encounter (Signed)
Samples are available, okay to dispense two boxes to patient? KW

## 2017-06-13 ENCOUNTER — Telehealth: Payer: Self-pay | Admitting: Family Medicine

## 2017-06-13 NOTE — Telephone Encounter (Signed)
Left two samples up front for patient, wife was notified. KW

## 2017-06-13 NOTE — Telephone Encounter (Signed)
Pt's wife [name] is requesting samples of Fluticasone-Salmeterol (ADVAIR DISKUS) 250-50 MCG/DOSE AEPB for pt. Please advise. Thanks TNP

## 2017-06-15 DIAGNOSIS — J449 Chronic obstructive pulmonary disease, unspecified: Secondary | ICD-10-CM | POA: Diagnosis not present

## 2017-06-16 DIAGNOSIS — J449 Chronic obstructive pulmonary disease, unspecified: Secondary | ICD-10-CM | POA: Diagnosis not present

## 2017-06-26 ENCOUNTER — Other Ambulatory Visit: Payer: Self-pay | Admitting: Family Medicine

## 2017-06-26 ENCOUNTER — Telehealth: Payer: Self-pay | Admitting: Family Medicine

## 2017-06-26 DIAGNOSIS — M109 Gout, unspecified: Secondary | ICD-10-CM

## 2017-06-26 MED ORDER — PREDNISONE 20 MG PO TABS: ORAL_TABLET | ORAL | 0 refills | Status: DC

## 2017-06-26 NOTE — Telephone Encounter (Signed)
If you call in Prednisone, call to [address].

## 2017-06-26 NOTE — Telephone Encounter (Signed)
I have sent in prednisone. He may have samples of Advair if we have it. Let his wife, [name], know that I will need to see him after his gout flare has resolved.

## 2017-06-26 NOTE — Telephone Encounter (Signed)
Advised  ED, sample given

## 2017-07-15 DIAGNOSIS — J449 Chronic obstructive pulmonary disease, unspecified: Secondary | ICD-10-CM | POA: Diagnosis not present

## 2017-07-16 DIAGNOSIS — J449 Chronic obstructive pulmonary disease, unspecified: Secondary | ICD-10-CM | POA: Diagnosis not present

## 2017-08-02 ENCOUNTER — Encounter: Payer: Self-pay | Admitting: Family Medicine

## 2017-08-02 ENCOUNTER — Ambulatory Visit (INDEPENDENT_AMBULATORY_CARE_PROVIDER_SITE_OTHER): Payer: Medicare HMO | Admitting: Family Medicine

## 2017-08-02 ENCOUNTER — Telehealth: Payer: Self-pay | Admitting: Family Medicine

## 2017-08-02 VITALS — BP 118/70 | HR 117 | Temp 98.5°F | Resp 16 | Wt 181.6 lb

## 2017-08-02 DIAGNOSIS — J432 Centrilobular emphysema: Secondary | ICD-10-CM | POA: Diagnosis not present

## 2017-08-02 DIAGNOSIS — J441 Chronic obstructive pulmonary disease with (acute) exacerbation: Secondary | ICD-10-CM | POA: Diagnosis not present

## 2017-08-02 MED ORDER — PREDNISONE 20 MG PO TABS: ORAL_TABLET | ORAL | 0 refills | Status: DC

## 2017-08-02 MED ORDER — DOXYCYCLINE HYCLATE 100 MG PO TABS: 100.0000 mg | ORAL_TABLET | Freq: Two times a day (BID) | ORAL | 0 refills | Status: DC

## 2017-08-02 MED ORDER — ALBUTEROL SULFATE HFA 108 (90 BASE) MCG/ACT IN AERS: 2.0000 | INHALATION_SPRAY | Freq: Four times a day (QID) | RESPIRATORY_TRACT | 2 refills | Status: DC | PRN

## 2017-08-02 NOTE — Telephone Encounter (Signed)
Patient has picked up prescribed inhaler. KW

## 2017-08-02 NOTE — Telephone Encounter (Signed)
The combivent would be ok in place of the albuterol

## 2017-08-02 NOTE — Progress Notes (Signed)
Subjective:     Patient ID: Bobby Peterson, male   DOB: 1941-06-07, 77 y.o.   MRN: [id number] Chief Complaint  Patient presents with  . Cough    Patient comes in office today with complaint of cough for one week, patient states that cough is productive of phelgm and he has had nausea. Patient has tried otc Robitussin D for relief    HPI Reports increased shortness of breath and thickening sputum. Constant oxygen use at 2 liters via n.c. Reports compliance with Advair but not on albuterol at this time. Not seeing any other doctors and "I don't get the flu shot".  Review of Systems     Objective:   Physical Exam  Constitutional: He appears well-developed and well-nourished. No distress.  Ears: T.M's intact without inflammation Sinuses: Throat:tonsils absent, oral pharynx appears tacky Neck: no cervical adenopathy Lungs: diminished breath sounds posterior lung fields without crackles      Assessment:    1. Chronic obstructive pulmonary disease with acute exacerbation (HCC: samples of Advair 250 x 2 - doxycycline (VIBRA-TABS) 100 MG tablet; Take 1 tablet (100 mg total) by mouth 2 (two) times daily.  Dispense: 20 tablet; Refill: 0 - predniSONE (DELTASONE) 20 MG tablet; One pill twice daily for 5 days.  Dispense: 10 tablet; Refill: 0  2. Centrilobular emphysema (HCC) - albuterol (PROVENTIL HFA;VENTOLIN HFA) 108 (90 Base) MCG/ACT inhaler; Inhale 2 puffs into the lungs every 6 (six) hours as needed for wheezing or shortness of breath.  Dispense: 1 Inhaler; Refill: 2    Plan:    Add Mucinex. Encouraged routine medical follow up visit when better.

## 2017-08-02 NOTE — Patient Instructions (Signed)
Add Mucinex to make secretions easier to get up. Once you are better come in for a regular visit to check on labs and your other medical problems.

## 2017-08-02 NOTE — Telephone Encounter (Signed)
Bobby Peterson has a combivent inhaler that doesn't expire till 2020 and he wants to know if he can use that.  Cannot afford the $90.00 + medication.  PLEASE CALL ASAP

## 2017-08-11 ENCOUNTER — Encounter: Payer: Self-pay | Admitting: Family Medicine

## 2017-08-11 ENCOUNTER — Ambulatory Visit (INDEPENDENT_AMBULATORY_CARE_PROVIDER_SITE_OTHER): Payer: Medicare HMO | Admitting: Family Medicine

## 2017-08-11 VITALS — BP 110/76 | HR 70 | Temp 97.6°F | Resp 16 | Wt 179.4 lb

## 2017-08-11 DIAGNOSIS — M109 Gout, unspecified: Secondary | ICD-10-CM

## 2017-08-11 DIAGNOSIS — I10 Essential (primary) hypertension: Secondary | ICD-10-CM | POA: Diagnosis not present

## 2017-08-11 DIAGNOSIS — E782 Mixed hyperlipidemia: Secondary | ICD-10-CM | POA: Diagnosis not present

## 2017-08-11 DIAGNOSIS — J441 Chronic obstructive pulmonary disease with (acute) exacerbation: Secondary | ICD-10-CM

## 2017-08-11 DIAGNOSIS — N183 Chronic kidney disease, stage 3 unspecified: Secondary | ICD-10-CM

## 2017-08-11 DIAGNOSIS — B370 Candidal stomatitis: Secondary | ICD-10-CM

## 2017-08-11 MED ORDER — PREDNISONE 20 MG PO TABS: ORAL_TABLET | ORAL | 1 refills | Status: DC

## 2017-08-11 MED ORDER — NYSTATIN 100000 UNIT/ML MT SUSP: 5.0000 mL | Freq: Four times a day (QID) | OROMUCOSAL | 0 refills | Status: DC

## 2017-08-11 NOTE — Patient Instructions (Signed)
We will call with the lab results. Consider following up with cardiology this year.

## 2017-08-11 NOTE — Progress Notes (Signed)
Subjective:     Patient ID: Bobby Peterson, male   DOB: 1940-09-20, 77 y.o.   MRN: [id number] Chief Complaint  Patient presents with  . COPD    Patient returns for follow up after being seen on 08/02/17, patient was started on Doxycycline 100mg  and Prednisone 20mg . Patient states that once he completed prednisone his symptoms seemed to have been worse and reports shortness of breath, patient states that cough is still present but improving.    HPI Accompanied by his wife. States his sputum Is white now. Has developed a" burning" mouth on medication. Felt best while on prednisone. Review of Systems  Musculoskeletal:       Reports occasional hand cramping       Objective:   Physical Exam  Constitutional: He appears well-developed and well-nourished. No distress.  HENT:  Oral erythema with small white plaques on the roof of his mouth  Cardiovascular: Normal rate and regular rhythm.  Pulmonary/Chest:  Diminished breath sounds in posterior fields.  Musculoskeletal: He exhibits no edema (of lower extremities).       Assessment:    1. Chronic obstructive pulmonary disease with acute exacerbation (HCC) - predniSONE (DELTASONE) 20 MG tablet; One pill twice daily for 5 days.  Dispense: 10 tablet; Refill: 1  2. Thrush, oral - nystatin (MYCOSTATIN) 100000 UNIT/ML suspension; Take 5 mLs (500,000 Units total) by mouth 4 (four) times daily.  Dispense: 120 mL; Refill: 0  3. Chronic kidney disease (CKD), stage III (moderate) (HCC) - Comprehensive metabolic panel  4. Mixed hyperlipidemia - Lipid panel  5. Essential hypertension - Magnesium  6. Gouty arthritis - Uric acid    Plan:    Further f/u pending lab results. Encouraged cardiology f/u this year.

## 2017-08-15 DIAGNOSIS — J449 Chronic obstructive pulmonary disease, unspecified: Secondary | ICD-10-CM | POA: Diagnosis not present

## 2017-08-16 DIAGNOSIS — J449 Chronic obstructive pulmonary disease, unspecified: Secondary | ICD-10-CM | POA: Diagnosis not present

## 2017-08-17 DIAGNOSIS — N183 Chronic kidney disease, stage 3 (moderate): Secondary | ICD-10-CM | POA: Diagnosis not present

## 2017-08-17 DIAGNOSIS — M109 Gout, unspecified: Secondary | ICD-10-CM | POA: Diagnosis not present

## 2017-08-17 DIAGNOSIS — E782 Mixed hyperlipidemia: Secondary | ICD-10-CM | POA: Diagnosis not present

## 2017-08-17 DIAGNOSIS — I10 Essential (primary) hypertension: Secondary | ICD-10-CM | POA: Diagnosis not present

## 2017-08-18 LAB — COMPREHENSIVE METABOLIC PANEL
A/G RATIO: 1.5 (ref 1.2–2.2)
ALT: 18 IU/L (ref 0–44)
AST: 8 IU/L (ref 0–40)
Albumin: 3.8 g/dL (ref 3.5–4.8)
Alkaline Phosphatase: 95 IU/L (ref 39–117)
BUN/Creatinine Ratio: 14 (ref 10–24)
BUN: 21 mg/dL (ref 8–27)
Bilirubin Total: 0.4 mg/dL (ref 0.0–1.2)
CALCIUM: 9.5 mg/dL (ref 8.6–10.2)
CO2: 31 mmol/L — AB (ref 20–29)
CREATININE: 1.48 mg/dL — AB (ref 0.76–1.27)
Chloride: 94 mmol/L — ABNORMAL LOW (ref 96–106)
GFR calc Af Amer: 52 mL/min/{1.73_m2} — ABNORMAL LOW (ref >59)
GFR, EST NON AFRICAN AMERICAN: 45 mL/min/{1.73_m2} — AB (ref >59)
GLOBULIN, TOTAL: 2.5 g/dL (ref 1.5–4.5)
Glucose: 247 mg/dL — ABNORMAL HIGH (ref 65–99)
POTASSIUM: 4.9 mmol/L (ref 3.5–5.2)
SODIUM: 142 mmol/L (ref 134–144)
TOTAL PROTEIN: 6.3 g/dL (ref 6.0–8.5)

## 2017-08-18 LAB — LIPID PANEL
CHOL/HDL RATIO: 2.6 ratio (ref 0.0–5.0)
CHOLESTEROL TOTAL: 108 mg/dL (ref 100–199)
HDL: 41 mg/dL (ref >39)
LDL CALC: 43 mg/dL (ref 0–99)
Triglycerides: 122 mg/dL (ref 0–149)
VLDL CHOLESTEROL CAL: 24 mg/dL (ref 5–40)

## 2017-08-18 LAB — URIC ACID: Uric Acid: 5 mg/dL (ref 3.7–8.6)

## 2017-08-18 LAB — MAGNESIUM: Magnesium: 2.6 mg/dL — ABNORMAL HIGH (ref 1.6–2.3)

## 2017-08-18 NOTE — Progress Notes (Signed)
Advised  ED 

## 2017-08-23 ENCOUNTER — Other Ambulatory Visit: Payer: Self-pay

## 2017-08-23 ENCOUNTER — Emergency Department
Admission: EM | Admit: 2017-08-23 | Discharge: 2017-08-24 | Disposition: A | Discharge status: Home / Self Care | Payer: Medicare HMO | Attending: Emergency Medicine | Admitting: Emergency Medicine

## 2017-08-23 DIAGNOSIS — N183 Chronic kidney disease, stage 3 (moderate): Secondary | ICD-10-CM | POA: Diagnosis not present

## 2017-08-23 DIAGNOSIS — Z7952 Long term (current) use of systemic steroids: Secondary | ICD-10-CM | POA: Diagnosis not present

## 2017-08-23 DIAGNOSIS — I130 Hypertensive heart and chronic kidney disease with heart failure and stage 1 through stage 4 chronic kidney disease, or unspecified chronic kidney disease: Secondary | ICD-10-CM | POA: Diagnosis not present

## 2017-08-23 DIAGNOSIS — T380X5A Adverse effect of glucocorticoids and synthetic analogues, initial encounter: Secondary | ICD-10-CM

## 2017-08-23 DIAGNOSIS — E1165 Type 2 diabetes mellitus with hyperglycemia: Secondary | ICD-10-CM | POA: Diagnosis not present

## 2017-08-23 DIAGNOSIS — E0965 Drug or chemical induced diabetes mellitus with hyperglycemia: Secondary | ICD-10-CM | POA: Diagnosis not present

## 2017-08-23 DIAGNOSIS — Z79899 Other long term (current) drug therapy: Secondary | ICD-10-CM | POA: Insufficient documentation

## 2017-08-23 DIAGNOSIS — R739 Hyperglycemia, unspecified: Secondary | ICD-10-CM | POA: Diagnosis present

## 2017-08-23 DIAGNOSIS — I5032 Chronic diastolic (congestive) heart failure: Secondary | ICD-10-CM | POA: Diagnosis not present

## 2017-08-23 DIAGNOSIS — Z87891 Personal history of nicotine dependence: Secondary | ICD-10-CM | POA: Diagnosis not present

## 2017-08-23 DIAGNOSIS — R000 Tachycardia, unspecified: Secondary | ICD-10-CM | POA: Diagnosis not present

## 2017-08-23 DIAGNOSIS — J449 Chronic obstructive pulmonary disease, unspecified: Secondary | ICD-10-CM | POA: Insufficient documentation

## 2017-08-23 LAB — BASIC METABOLIC PANEL
Anion gap: 10 (ref 5–15)
BUN: 22 mg/dL — ABNORMAL HIGH (ref 6–20)
CHLORIDE: 91 mmol/L — AB (ref 101–111)
CO2: 30 mmol/L (ref 22–32)
CREATININE: 1.76 mg/dL — AB (ref 0.61–1.24)
Calcium: 8.9 mg/dL (ref 8.9–10.3)
GFR calc Af Amer: 41 mL/min — ABNORMAL LOW (ref >60)
GFR calc non Af Amer: 35 mL/min — ABNORMAL LOW (ref >60)
Glucose, Bld: 396 mg/dL — ABNORMAL HIGH (ref 65–99)
Potassium: 4.6 mmol/L (ref 3.5–5.1)
Sodium: 131 mmol/L — ABNORMAL LOW (ref 135–145)

## 2017-08-23 LAB — URINALYSIS, COMPLETE (UACMP) WITH MICROSCOPIC
BILIRUBIN URINE: NEGATIVE
Hgb urine dipstick: NEGATIVE
KETONES UR: NEGATIVE mg/dL
Leukocytes, UA: NEGATIVE
Nitrite: NEGATIVE
PH: 5 (ref 5.0–8.0)
PROTEIN: NEGATIVE mg/dL
Specific Gravity, Urine: 1.017 (ref 1.005–1.030)

## 2017-08-23 LAB — CBC
HCT: 43 % (ref 40.0–52.0)
Hemoglobin: 13.9 g/dL (ref 13.0–18.0)
MCH: 27.7 pg (ref 26.0–34.0)
MCHC: 32.4 g/dL (ref 32.0–36.0)
MCV: 85.5 fL (ref 80.0–100.0)
PLATELETS: 268 10*3/uL (ref 150–440)
RBC: 5.03 MIL/uL (ref 4.40–5.90)
RDW: 15.4 % — AB (ref 11.5–14.5)
WBC: 16.1 10*3/uL — ABNORMAL HIGH (ref 3.8–10.6)

## 2017-08-23 LAB — GLUCOSE, CAPILLARY
Glucose-Capillary: 361 mg/dL — ABNORMAL HIGH (ref 65–99)
Glucose-Capillary: 314 mg/dL — ABNORMAL HIGH (ref 65–99)
Glucose-Capillary: 257 mg/dL — ABNORMAL HIGH (ref 65–99)

## 2017-08-23 MED ORDER — INSULIN ASPART 100 UNIT/ML ~~LOC~~ SOLN
5.0000 [IU] | Freq: Once | SUBCUTANEOUS | Status: DC
Start: 2017-08-23 — End: 2017-08-23

## 2017-08-23 MED ORDER — SODIUM CHLORIDE 0.9 % IV BOLUS (SEPSIS)
1000.0000 mL | Freq: Once | INTRAVENOUS | Status: AC
  Administered 2017-08-23: 1000 mL via INTRAVENOUS

## 2017-08-23 NOTE — ED Triage Notes (Addendum)
Pt to ER because his blood sugar is high. Pt states that he checked it due to dizziness that began today. Pt arrives on his own nasal cannula, wears 2L [place] at baseline. Pt alert and oriented X4, active, cooperative, pt in NAD. RR even and unlabored, color WNL.  Pt not taking any medications for diabetes currently.  Pt taking steroids for bronchitis. Finished the antibiotics already

## 2017-08-23 NOTE — ED Provider Notes (Signed)
[facility] Emergency Department Provider Note  ____________________________________________  Time seen: Approximately 9:21 PM  I have reviewed the triage vital signs and the nursing notes.   HISTORY  Chief Complaint Hyperglycemia   HPI Bobby Peterson is a 77 y.o. male with a history of diet controlled diabetes, CAD, COPD who presents for evaluation of hyperglycemia. Patient reports that he was sitting down watching TV and had a very light episode of dizziness. His wife checked his blood glucose which was in the 300s. She checked again a few hours later and it had gone up to 400. Patient has been on prednisone for 4 weeks for bronchitis. The last dose was today. Patient reports polyuria and polydipsia. He denies nausea, vomiting, diarrhea, fever, chills, SOB, CP.   Chief Complaint: hyperglycemia Severity: mild Duration: today Context: in the setting of 4 weeks of prednisone Modifying factors: better with hydration Associated signs/symptoms: polyuria and polydpsia    Past Medical History:  Diagnosis Date  . Acute respiratory failure with hypoxia ([place]) 12/03/2013   Overview:  Overview:  2 L O2 continuously  Last Assessment & Plan:  Continue 2 L O2 continuously   . Chronic diastolic CHF (congestive heart failure) ([place])   . Chronic respiratory failure (HCC)    a. on home O2  . COPD (chronic obstructive pulmonary disease) ([place])   . Coronary artery disease   . Diabetes ([place])   . Hernia, umbilical   . Hypercholesteremia   . Hypertension   . Obesity   . Renal cancer [facility])    a. s/p nephrectomy in 1980's    Patient Active Problem List   Diagnosis Date Noted  . 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 Smoking 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 pack years 05/10/2016  . Atrial tachycardia ([place]) 04/21/2016  . Allergic rhinitis 07/08/2015  . 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 Coronary atherosclerosis 07/08/2015  . Chronic diastolic heart  failure ([place]) 07/08/2015  . Chronic kidney disease (CKD), stage III (moderate) ([place]) 07/08/2015  . Elevated blood sugar 07/08/2015  . Esophageal reflux 07/08/2015  . HLD (hyperlipidemia) 07/08/2015  . BP (high blood pressure) 07/08/2015  . Calculus of kidney 07/08/2015  . COPD (chronic obstructive pulmonary disease) ([place]) 12/03/2013  . Chronic hypoxemic respiratory failure ([place]) 12/03/2013    Past Surgical History:  Procedure Laterality Date  . CHOLECYSTECTOMY N/A 12/22/2014   Procedure: LAPAROSCOPIC CHOLECYSTECTOMY WITH INTRAOPERATIVE CHOLANGIOGRAM;  Surgeon: Christene Lye, MD;  Location: [facility] ORS;  Service: General;  Laterality: N/A;  . COLONOSCOPY    . ERCP N/A 01/05/2015   Procedure: ENDOSCOPIC RETROGRADE CHOLANGIOPANCREATOGRAPHY (ERCP);  Surgeon: Hulen Luster, MD;  Location: [facility] ENDOSCOPY;  Service: Endoscopy;  Laterality: N/A;  . ERCP N/A 02/19/2015   Procedure: ENDOSCOPIC RETROGRADE CHOLANGIOPANCREATOGRAPHY (ERCP);  Surgeon: Hulen Luster, MD;  Location: [facility] ENDOSCOPY;  Service: Gastroenterology;  Laterality: N/A;  . GALLBLADDER SURGERY  11/2014  . NEPHRECTOMY  1990  . OMENTECTOMY  12/22/2014   Procedure: OMENTECTOMY;  Surgeon: Christene Lye, MD;  Location: [facility] ORS;  Service: General;;  Partial omentectomy  . STENT REMOVAL    . UMBILICAL HERNIA REPAIR N/A 12/22/2014   Procedure: HERNIA REPAIR UMBILICAL ADULT;  Surgeon: Christene Lye, MD;  Location: [facility] ORS;  Service: General;  Laterality: N/A;    Prior to Admission medications   Medication Sig Start Date End Date Taking? Authorizing Provider  albuterol (PROVENTIL HFA;VENTOLIN HFA) 108 (90 Base) MCG/ACT inhaler Inhale 2 puffs into the lungs  every 6 (six) hours as needed for wheezing or shortness of breath. 08/02/17  Yes Carmon Ginsberg, PA  aspirin 81 MG tablet Take 81 mg by mouth daily.   Yes [provider]  Fluticasone-Salmeterol (ADVAIR DISKUS) 250-50 MCG/DOSE AEPB Inhale 1 puff into the lungs 2 (two)  times daily. 08/02/16  Yes Carmon Ginsberg, PA  furosemide (LASIX) 40 MG tablet TAKE 1 TABLET EVERY DAY 05/12/17  Yes Carmon Ginsberg, PA  gabapentin (NEURONTIN) 300 MG capsule TAKE 1 CAPSULE TWICE DAILY 05/12/17  Yes Carmon Ginsberg, PA  metoprolol succinate (TOPROL-XL) 100 MG 24 hr tablet Take 1 tablet (100 mg total) by mouth daily. Take with or immediately following a meal. 08/02/16  Yes Chauvin, Herbie [place], PA  nystatin (MYCOSTATIN) 100000 UNIT/ML suspension Take 5 mLs (500,000 Units total) by mouth 4 (four) times daily. 08/11/17  Yes Carmon Ginsberg, PA  omeprazole (PRILOSEC) 40 MG capsule TAKE 1 CAPSULE DAILY BEFORE BREAKFAST. 05/12/17  Yes Carmon Ginsberg, PA  OXYGEN Inhale 2 L/min into the lungs continuous.    Yes [provider]  pravastatin (PRAVACHOL) 20 MG tablet TAKE 1 TABLET EVERY DAY 05/12/17  Yes Carmon Ginsberg, PA  predniSONE (DELTASONE) 20 MG tablet One pill twice daily for 5 days. 08/11/17  Yes Carmon Ginsberg, PA  tamsulosin (FLOMAX) 0.4 MG CAPS capsule TAKE 1 CAPSULE EVERY DAY 05/12/17  Yes Carmon Ginsberg, PA  vitamin B-12 (CYANOCOBALAMIN) 1000 MCG tablet Take 1,000 mcg by mouth once a week. *Take on Monday*   Yes [provider]  doxycycline (VIBRA-TABS) 100 MG tablet Take 1 tablet (100 mg total) by mouth 2 (two) times daily. Patient not taking: Reported on 08/23/2017 08/02/17   Carmon Ginsberg, PA    Allergies Fenofibrate; Lisinopril; Niacin; Phenergan [promethazine hcl]; and Simvastatin  Family History  Problem Relation Age of Onset  . Hypertension Mother     Social History Social History   Tobacco Use  . Smoking status: Former Smoker    Packs/day: 1.00    Years: 50.00    Pack years: 50.00    Types: Cigarettes    Last attempt to quit: 12/04/2003    Years since quitting: 13.7  . Smokeless tobacco: Current User    Types: Chew  . Tobacco comment: Has been chewing tobacco since quitting smoking in 2005.  Substance Use Topics  . Alcohol use: No  . Drug  use: No    Review of Systems  Constitutional: Negative for fever. + dizziness Eyes: Negative for visual changes. ENT: Negative for sore throat. Neck: No neck pain  Cardiovascular: Negative for chest pain. Respiratory: Negative for shortness of breath. Gastrointestinal: Negative for abdominal pain, vomiting or diarrhea. Genitourinary: Negative for dysuria. Endo: + Polyuria and polydipsia Musculoskeletal: Negative for back pain. Skin: Negative for rash. Neurological: Negative for headaches, weakness or numbness. Psych: No SI or HI  ____________________________________________   PHYSICAL EXAM:  VITAL SIGNS: ED Triage Vitals  Enc Vitals Group     BP 08/23/17 1818 (!) 122/55     Pulse Rate 08/23/17 1818 (!) 110     Resp 08/23/17 1818 (!) 22     Temp 08/23/17 1818 98.6 F (37 C)     Temp Source 08/23/17 1818 Oral     SpO2 08/23/17 1818 96 %     Weight 08/23/17 1819 180 lb (81.6 kg)     Height --      Head Circumference --      Peak Flow --      Pain Score --  Pain Loc --      Pain Edu? --      Excl. in [place]? --     Constitutional: Alert and oriented. Well appearing and in no apparent distress. HEENT:      Head: Normocephalic and atraumatic.         Eyes: Conjunctivae are normal. Sclera is non-icteric.       Mouth/Throat: Mucous membranes are dry.       Neck: Supple with no signs of meningismus. Cardiovascular: Regular rate and rhythm. No murmurs, gallops, or rubs. 2+ symmetrical distal pulses are present in all extremities. No JVD. Respiratory: Normal respiratory effort. Lungs are clear to auscultation bilaterally. No wheezes, crackles, or rhonchi.  Gastrointestinal: Soft, non tender, and non distended with positive bowel sounds. No rebound or guarding. Genitourinary: No CVA tenderness. Musculoskeletal: Nontender with normal range of motion in all extremities. No edema, cyanosis, or erythema of extremities. Neurologic: Normal speech and language. Face is symmetric.  Moving all extremities. No gross focal neurologic deficits are appreciated. Skin: Skin is warm, dry and intact. No rash noted. Psychiatric: Mood and affect are normal. Speech and behavior are normal.  ____________________________________________   LABS (all labs ordered are listed, but only abnormal results are displayed)  Labs Reviewed  BASIC METABOLIC PANEL - Abnormal; Notable for the following components:      Result Value   Sodium 131 (*)    Chloride 91 (*)    Glucose, Bld 396 (*)    BUN 22 (*)    Creatinine, Ser 1.76 (*)    GFR calc non Af Amer 35 (*)    GFR calc Af Amer 41 (*)    All other components within normal limits  CBC - Abnormal; Notable for the following components:   WBC 16.1 (*)    RDW 15.4 (*)    All other components within normal limits  GLUCOSE, CAPILLARY - Abnormal; Notable for the following components:   Glucose-Capillary 361 (*)    All other components within normal limits  GLUCOSE, CAPILLARY - Abnormal; Notable for the following components:   Glucose-Capillary 314 (*)    All other components within normal limits  GLUCOSE, CAPILLARY - Abnormal; Notable for the following components:   Glucose-Capillary 257 (*)    All other components within normal limits  URINALYSIS, COMPLETE (UACMP) WITH MICROSCOPIC  CBG MONITORING, ED  CBG MONITORING, ED   ____________________________________________  EKG  ED ECG REPORT I, Rudene Re, the attending physician, personally viewed and interpreted this ECG.  Sinus tachycardia, rate of 109, normal intervals, normal axis, no ST elevations or depressions. Unchanged from prior from August 12, 2017 ____________________________________________  RADIOLOGY  none  ____________________________________________   PROCEDURES  Procedure(s) performed: None Procedures Critical Care performed:  None ____________________________________________   INITIAL IMPRESSION / ASSESSMENT AND PLAN / ED COURSE  77 y.o. male  with a history of diet controlled diabetes, CAD, COPD who presents for evaluation of hyperglycemia in the setting of 4 weeks of prednisone for bronchitis, polyuria and polydpsia. Patient looks mildldy dehydrated on exam with dry mucous membranes and slight tachycardia. Labs showing hyperglycemia with no evidence of DKA. Patient with slight bump in his creatinine consistent with dehydration. We'll give IV fluids and recheck his blood glucose. Patient does have a white count of 16.1 and the setting of being on prednisone. UA is pending to rule out UTI. He has no cough or fever and no symptoms of URI. No nausea or vomiting, no symptoms of gastritis. No abdominal pain.  _________________________ 11:14 PM on 08/23/2017 -----------------------------------------  Blood glucose trending down. UA is pending. Plan to repeat blood glucose after 1 L of IV fluid and check urinalysis to rule out UTI. Recommended that patient stop prednisone which this evening was his last dose and follow up with his PCP in 2 days. Discussed plan for follow-up and return precautions with patient and his wife were in agreement. Care is transferred to Dr. Beather Arbour   As part of my medical decision making, I reviewed the following data within the [facility] notes reviewed and incorporated, Labs reviewed , Patient signed out to Dr. Beather Arbour, Notes from prior ED visits and [place] Controlled Substance Database    Pertinent labs & imaging results that were available during my care of the patient were reviewed by me and considered in my medical decision making (see chart for details).    ____________________________________________   FINAL CLINICAL IMPRESSION(S) / ED DIAGNOSES  Final diagnoses:  Steroid-induced hyperglycemia      NEW MEDICATIONS STARTED DURING THIS VISIT:  ED Discharge Orders    None       Note:  This document was prepared using Dragon voice recognition software and may include  unintentional dictation errors.    Alfred Levins, [place], MD 08/23/17 [phone]

## 2017-08-23 NOTE — Discharge Instructions (Signed)
Stop taking prednisone. Drink plenty of fluids at home. Follow-up with your doctor in 2 days. Return to the emergency room if you have dizziness, vomiting, abdominal pain, chest pain.

## 2017-08-24 LAB — GLUCOSE, CAPILLARY: Glucose-Capillary: 213 mg/dL — ABNORMAL HIGH (ref 65–99)

## 2017-08-24 NOTE — ED Notes (Signed)

## 2017-08-24 NOTE — ED Provider Notes (Signed)
-----------------------------------------   12:57 AM on 08/24/2017 -----------------------------------------  Patient resting in no acute distress.  Updated him of urinalysis result.  Repeat blood glucose improved to 213.  Strict return precautions given.  Patient and spouse verbalize understanding and agree with plan of care.   Paulette Blanch, MD 08/24/17 [phone]

## 2017-08-28 ENCOUNTER — Ambulatory Visit (INDEPENDENT_AMBULATORY_CARE_PROVIDER_SITE_OTHER): Payer: Medicare HMO | Admitting: Family Medicine

## 2017-08-28 ENCOUNTER — Encounter: Payer: Self-pay | Admitting: Family Medicine

## 2017-08-28 VITALS — BP 110/62 | HR 117 | Temp 98.0°F | Resp 16 | Wt 176.8 lb

## 2017-08-28 DIAGNOSIS — R739 Hyperglycemia, unspecified: Secondary | ICD-10-CM | POA: Diagnosis not present

## 2017-08-28 DIAGNOSIS — N183 Chronic kidney disease, stage 3 unspecified: Secondary | ICD-10-CM

## 2017-08-28 NOTE — Patient Instructions (Signed)
Cooking With Less Salt Cooking with less salt is one way to reduce the amount of sodium you get from food. Depending on your condition and overall health, your health care provider or diet and nutrition specialist (dietitian) may recommend that you reduce your sodium intake. Most people should have less than 2,300 milligrams (mg) of sodium each day. If you have high blood pressure (hypertension), you may need to limit your sodium to 1,500 mg each day. Follow the tips below to help reduce your sodium intake. What do I need to know about cooking with less salt? Shopping  Buy sodium-free or low-sodium products. Look for the following words on food labels: ? Low-sodium. ? Sodium-free. ? Reduced-sodium. ? No salt added. ? Unsalted.  Buy fresh or frozen vegetables. Avoid canned vegetables.  Avoid buying meats or protein foods that have been injected with broth or saline solution.  Avoid cured or smoked meats, such as hot dogs, bacon, salami, ham, and bologna. Reading food labels  Check the food label before buying or using packaged ingredients.  Look for products with no more than 140 mg of sodium in one seving.  Do not choose foods with salt as one of the first three ingredients on the ingredients list. If salt is one of the first three ingredients, it usually means the item is high in sodium, because ingredients are listed in order of amount in the food item. Cooking  Use herbs, seasonings without salt, and spices as substitutes for salt in foods.  Use sodium-free baking soda when baking.  Grill, braise, or roast foods to add flavor with less salt.  Avoid adding salt to pasta, rice, or hot cereals while cooking.  Drain and rinse canned vegetables before use.  Avoid adding salt when cooking sweets and desserts.  Cook with low-sodium ingredients. What are some salt alternatives? The following are herbs, seasonings, and spices that can be used instead of salt to give taste to your  food. Herbs should be fresh or dried. Do not choose packaged mixes. Next to the name of the herb, spice, or seasoning are some examples of foods you can pair it with. Herbs  Bay leaves - Soups, meat and vegetable dishes, and spaghetti sauce.  Basil - [employer], soups, pasta, and fish dishes.  Cilantro - Meat, poultry, and vegetable dishes.  Chili powder - Marinades and Mexican dishes.  Chives - Salad dressings and potato dishes.  Cumin - Mexican dishes, couscous, and meat dishes.  Dill - Fish dishes, sauces, and salads.  Fennel - Meat and vegetable dishes, breads, and cookies.  Garlic (do not use garlic salt) - [place] dishes, meat dishes, salad dressings, and sauces.  Marjoram - Soups, potato dishes, and meat dishes.  Oregano - Pizza and spaghetti sauce.  Parsley - Salads, soups, pasta, and meat dishes.  Rosemary - [place] dishes, salad dressings, soups, and red meats.  Saffron - Fish dishes, pasta, and some poultry dishes.  Sage - Stuffings and sauces.  Tarragon - Fish and [employer].  Thyme - Stuffing, meat, and fish dishes. Seasonings  Lemon juice - Fish dishes, poultry dishes, vegetables, and salads.  Vinegar - Salad dressings, vegetables, and fish dishes. Spices  Cinnamon - Sweet dishes, such as cakes, cookies, and puddings.  Cloves - Gingerbread, puddings, and marinades for meats.  Curry - Vegetable dishes, fish and poultry dishes, and stir-fry dishes.  Ginger - Vegetables dishes, fish dishes, and stir-fry dishes.  Nutmeg - Pasta, vegetables, poultry, fish dishes, and custard. What  are some low-sodium ingredients and foods?  Fresh or frozen fruits and vegetables with no sauce added.  Fresh or frozen whole meats, poultry, and fish with no sauce added.  Eggs.  Noodles, pasta, quinoa, rice.  Shredded or puffed wheat or puffed rice.  Regular or quick oats.  Milk, yogurt, hard cheeses, and low-sodium cheeses. Good cheese choices include  Swiss, [place]. Always check the label for the serving size and sodium content.  Unsalted butter or margarine.  Unsalted nuts.  Sherbet or ice cream (keep to  cup per serving).  Homemade pudding.  Sodium-free baking soda and baking powder. This is not a complete list of low-sodium ingredients and foods. Contact your dietitian for more options. Summary  Cooking with less salt is one way to reduce the amount of sodium that you get from food.  Buy sodium-free or low-sodium products.  Check the food label before using or buying packaged ingredients.  Use herbs, seasonings without salt, and spices as substitutes for salt in foods. This information is not intended to replace advice given to you by your health care provider. Make sure you discuss any questions you have with your health care provider. Document Released: 07/18/2005 Document Revised: 07/26/2016 Document Reviewed: 07/26/2016 Elsevier Interactive Patient Education  2017 [employer].  We will call you with the lab result.

## 2017-08-28 NOTE — Progress Notes (Signed)
Subjective:     Patient ID:  SPERL, male   DOB: 04-10-1941, 77 y.o.   MRN: [id number] Chief Complaint  Patient presents with  . Hyperglycemia    Patient returns to office today for hospital follow up after being seen at [facility] on 08/23/17 for steroid induced hyperglycemia. Wife reports that patients blood sugar on 08/23/17 was up in the 400s and patient had light headiness and dizziness. Patient has since stopped Prednisone since being dischages, wife reports that blood sugar this morning was 104 and on Sunday blood sugar fasting was 135, 166 in afternoon and 164 in evening. Patient denies any hyperglycemia incidents.    HPI States he feels better and his mouth is less dry since coming off prednisone and receiving IV fluids in the ER.GFR was down to 35 at that time. He is accompanied by his wife and they have questions about diet. He likes salt laden foods so will provide further information about low salt choices.. Wife will check sugars a couple x week and make sure that he remains diet controlled.  Review of Systems     Objective:   Physical Exam  Constitutional: He appears well-developed and well-nourished. No distress (using oxygen per baseline).       Assessment:    1. Elevated blood sugar - Renal function panel  2. Chronic kidney disease (CKD), stage III (moderate) (HCC) - Renal function panel    Plan:    Further f/u pending lab results.

## 2017-08-29 ENCOUNTER — Telehealth: Payer: Self-pay

## 2017-08-29 LAB — RENAL FUNCTION PANEL
ALBUMIN: 3.4 g/dL — AB (ref 3.5–4.8)
BUN/Creatinine Ratio: 12 (ref 10–24)
BUN: 18 mg/dL (ref 8–27)
CO2: 25 mmol/L (ref 20–29)
CREATININE: 1.5 mg/dL — AB (ref 0.76–1.27)
Calcium: 8.9 mg/dL (ref 8.6–10.2)
Chloride: 96 mmol/L (ref 96–106)
GFR, EST AFRICAN AMERICAN: 51 mL/min/{1.73_m2} — AB (ref >59)
GFR, EST NON AFRICAN AMERICAN: 44 mL/min/{1.73_m2} — AB (ref >59)
GLUCOSE: 92 mg/dL (ref 65–99)
POTASSIUM: 4.5 mmol/L (ref 3.5–5.2)
Phosphorus: 3.5 mg/dL (ref 2.5–4.5)
Sodium: 138 mmol/L (ref 134–144)

## 2017-08-29 NOTE — Telephone Encounter (Signed)
Patient has been advised. KW 

## 2017-08-29 NOTE — Telephone Encounter (Signed)
-----   Message from Carmon Ginsberg, [place] sent at 08/29/2017  7:23 AM EST ----- Sugar is normal and kidney function has improved back to your baseline.

## 2017-09-05 ENCOUNTER — Encounter: Payer: Self-pay | Admitting: Family Medicine

## 2017-09-05 ENCOUNTER — Ambulatory Visit (INDEPENDENT_AMBULATORY_CARE_PROVIDER_SITE_OTHER): Payer: Medicare HMO | Admitting: Family Medicine

## 2017-09-05 VITALS — BP 118/70 | HR 90 | Temp 97.9°F | Resp 16 | Wt 172.8 lb

## 2017-09-05 DIAGNOSIS — R300 Dysuria: Secondary | ICD-10-CM

## 2017-09-05 DIAGNOSIS — N3090 Cystitis, unspecified without hematuria: Secondary | ICD-10-CM | POA: Diagnosis not present

## 2017-09-05 LAB — POCT URINALYSIS DIPSTICK
Glucose, UA: NEGATIVE
KETONES UA: NEGATIVE
Nitrite, UA: NEGATIVE
Protein, UA: 300
SPEC GRAV UA: 1.02 (ref 1.010–1.025)
UROBILINOGEN UA: 1 U/dL
pH, UA: 6 (ref 5.0–8.0)

## 2017-09-05 MED ORDER — CEPHALEXIN 500 MG PO CAPS: 500.0000 mg | ORAL_CAPSULE | Freq: Two times a day (BID) | ORAL | 0 refills | Status: DC

## 2017-09-05 NOTE — Progress Notes (Signed)
Subjective:     Patient ID: Bobby Peterson, male   DOB: Dec 16, 1940, 77 y.o.   MRN: [id number] Chief Complaint  Patient presents with  . Dysuria    Patient comes in office today with complaints of dysuria for 24hrs.    HPI Wife, [name], remarks he was urinating more frequently at night for the last week. Reports compliance with Flomax though states he has had trouble with initiation of urination. Review of Systems     Objective:   Physical Exam  Constitutional: He appears well-developed and well-nourished. No distress.       Assessment:    1. Dysuria - POCT urinalysis dipstick - Urine Culture  2. Cystitis - cephALEXin (KEFLEX) 500 MG capsule; Take 1 capsule (500 mg total) by mouth 2 (two) times daily.  Dispense: 14 capsule; Refill: 0    Plan:    Further f/u pending cx results. Encouraged increased fluid intake. Consider increasing Flomax if recurrent.

## 2017-09-05 NOTE — Patient Instructions (Signed)
We will call you with the urine culture result later this week. Keep up with your fluids.

## 2017-09-07 ENCOUNTER — Telehealth: Payer: Self-pay

## 2017-09-07 LAB — URINE CULTURE

## 2017-09-07 NOTE — Telephone Encounter (Signed)
Spoke with patient and his wife and advised of culture,wife reports that antibiotic seems to be helping but patient still complains of dysuria on/off. Bobby Peterson

## 2017-09-07 NOTE — Telephone Encounter (Signed)
-----   Message from Carmon Ginsberg, [place] sent at 09/07/2017  7:32 AM EST ----- No specific organism on culture. Is the antibiotic helping?

## 2017-09-15 DIAGNOSIS — J449 Chronic obstructive pulmonary disease, unspecified: Secondary | ICD-10-CM | POA: Diagnosis not present

## 2017-09-16 DIAGNOSIS — J449 Chronic obstructive pulmonary disease, unspecified: Secondary | ICD-10-CM | POA: Diagnosis not present

## 2017-09-18 ENCOUNTER — Ambulatory Visit (INDEPENDENT_AMBULATORY_CARE_PROVIDER_SITE_OTHER): Payer: Medicare HMO | Admitting: Family Medicine

## 2017-09-18 VITALS — BP 100/60 | HR 138 | Temp 97.9°F | Wt 176.0 lb

## 2017-09-18 DIAGNOSIS — R319 Hematuria, unspecified: Secondary | ICD-10-CM

## 2017-09-18 DIAGNOSIS — N183 Chronic kidney disease, stage 3 unspecified: Secondary | ICD-10-CM

## 2017-09-18 DIAGNOSIS — N390 Urinary tract infection, site not specified: Secondary | ICD-10-CM | POA: Diagnosis not present

## 2017-09-18 DIAGNOSIS — J432 Centrilobular emphysema: Secondary | ICD-10-CM | POA: Diagnosis not present

## 2017-09-18 DIAGNOSIS — I471 Supraventricular tachycardia: Secondary | ICD-10-CM

## 2017-09-18 DIAGNOSIS — E860 Dehydration: Secondary | ICD-10-CM

## 2017-09-18 DIAGNOSIS — I10 Essential (primary) hypertension: Secondary | ICD-10-CM

## 2017-09-18 LAB — POCT URINALYSIS DIPSTICK
Bilirubin, UA: NEGATIVE
Glucose, UA: NEGATIVE
KETONES UA: NEGATIVE
NITRITE UA: POSITIVE
SPEC GRAV UA: 1.01 (ref 1.010–1.025)
UROBILINOGEN UA: 0.2 U/dL
pH, UA: 6 (ref 5.0–8.0)

## 2017-09-18 MED ORDER — DOXYCYCLINE HYCLATE 100 MG PO TABS: 100.0000 mg | ORAL_TABLET | Freq: Two times a day (BID) | ORAL | 0 refills | Status: DC

## 2017-09-18 NOTE — Progress Notes (Signed)
Bobby Peterson  MRN: [id number] DOB: 1941-02-05  Subjective:  HPI   The patient is a 77 year old male who presents for persistent dysuria.  The patient was seen on 09/05/17 by the PA in the office and was treated with Keflex.  Patient states it burns when he urinates and today he was nauseated and vomited after drinking a gatorade. No CP/Diaphoresis/fever.  Patient Active Problem List   Diagnosis Date Noted  . 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 Smoking 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 pack years 05/10/2016  . Atrial tachycardia ([place]) 04/21/2016  . Allergic rhinitis 07/08/2015  . 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 Coronary atherosclerosis 07/08/2015  . Chronic diastolic heart failure ([place]) 07/08/2015  . Chronic kidney disease (CKD), stage III (moderate) ([place]) 07/08/2015  . Elevated blood sugar 07/08/2015  . Esophageal reflux 07/08/2015  . HLD (hyperlipidemia) 07/08/2015  . BP (high blood pressure) 07/08/2015  . Calculus of kidney 07/08/2015  . COPD (chronic obstructive pulmonary disease) ([place]) 12/03/2013  . Chronic hypoxemic respiratory failure ([place]) 12/03/2013    Past Medical History:  Diagnosis Date  . Acute respiratory failure with hypoxia ([place]) 12/03/2013   Overview:  Overview:  2 L O2 continuously  Last Assessment & Plan:  Continue 2 L O2 continuously   . Chronic diastolic CHF (congestive heart failure) ([place])   . Chronic respiratory failure (HCC)    a. on home O2  . COPD (chronic obstructive pulmonary disease) ([place])   . Coronary artery disease   . Diabetes ([place])   . Hernia, umbilical   . Hypercholesteremia   . Hypertension   . Obesity   . Renal cancer [facility] [place])    a. s/p nephrectomy in 1980's    Social History   Socioeconomic History  . Marital status: Married    Spouse name: Not on file  . Number of children: Not on file  . Years of education: Not on file  . Highest education level: Not on file  Social Needs  . Financial resource strain: Not  on file  . Food insecurity - worry: Not on file  . Food insecurity - inability: Not on file  . Transportation needs - medical: Not on file  . Transportation needs - non-medical: Not on file  Occupational History  . Not on file  Tobacco Use  . Smoking status: Former Smoker    Packs/day: 1.00    Years: 50.00    Pack years: 50.00    Types: Cigarettes    Last attempt to quit: 12/04/2003    Years since quitting: 13.8  . Smokeless tobacco: Current User    Types: Chew  . Tobacco comment: Has been chewing tobacco since quitting smoking in 2005.  Substance and Sexual Activity  . Alcohol use: No  . Drug use: No  . Sexual activity: Not on file  Other Topics Concern  . Not on file  Social History Narrative  . Not on file    Outpatient Encounter Medications as of 09/18/2017  Medication Sig  . albuterol (PROVENTIL HFA;VENTOLIN HFA) 108 (90 Base) MCG/ACT inhaler Inhale 2 puffs into the lungs every 6 (six) hours as needed for wheezing or shortness of breath.  [name] aspirin 81 MG tablet Take 81 mg by mouth daily.  . Fluticasone-Salmeterol (ADVAIR DISKUS) 250-50 MCG/DOSE AEPB Inhale 1 puff into the lungs 2 (two) times daily.  . furosemide (LASIX) 40 MG tablet TAKE 1 TABLET EVERY DAY  . gabapentin (NEURONTIN) 300 MG capsule TAKE 1  CAPSULE TWICE DAILY  . metoprolol succinate (TOPROL-XL) 100 MG 24 hr tablet Take 1 tablet (100 mg total) by mouth daily. Take with or immediately following a meal.  . omeprazole (PRILOSEC) 40 MG capsule TAKE 1 CAPSULE DAILY BEFORE BREAKFAST.  [name] OXYGEN Inhale 2 L/min into the lungs continuous.   . pravastatin (PRAVACHOL) 20 MG tablet TAKE 1 TABLET EVERY DAY  . tamsulosin (FLOMAX) 0.4 MG CAPS capsule TAKE 1 CAPSULE EVERY DAY  . vitamin B-12 (CYANOCOBALAMIN) 1000 MCG tablet Take 1,000 mcg by mouth once a week. *Take on Monday*  . [DISCONTINUED] cephALEXin (KEFLEX) 500 MG capsule Take 1 capsule (500 mg total) by mouth 2 (two) times daily.  . [DISCONTINUED] nystatin (MYCOSTATIN)  100000 UNIT/ML suspension Take 5 mLs (500,000 Units total) by mouth 4 (four) times daily. (Patient not taking: Reported on 09/05/2017)   No facility-administered encounter medications on file as of 09/18/2017.     Allergies  Allergen Reactions  . Fenofibrate     Other reaction(s): Headache  . Lisinopril Cough  . Niacin     Other reaction(s): Dizziness  . Phenergan [Promethazine Hcl]     NVD  . Simvastatin     Other reaction(s): Headache    Review of Systems  Constitutional: Negative for fever and malaise/fatigue.  HENT: Negative.   Eyes: Negative.   Respiratory: Positive for shortness of breath and wheezing. Negative for cough.   Cardiovascular: Negative for chest pain, palpitations and leg swelling.  Gastrointestinal: Negative.   Genitourinary: Positive for dysuria. Negative for flank pain, frequency, hematuria and urgency.  Skin: Negative.   Neurological: Negative.  Negative for weakness.  Endo/Heme/Allergies: Negative.   Psychiatric/Behavioral: Negative.     Objective:  BP 100/60 (BP Location: Right Arm, Patient Position: Sitting, Cuff Size: Normal)   Pulse (!) 138   Temp 97.9 F (36.6 C) (Oral)   Wt 176 lb (79.8 kg)   SpO2 91%   BMI 29.29 kg/m   Physical Exam  Constitutional: He is oriented to person, place, and time and well-developed, well-nourished, and in no distress.  HENT:  Head: Normocephalic and atraumatic.  Right Ear: External ear normal.  Left Ear: External ear normal.  Nose: Nose normal.  Eyes: Conjunctivae are normal. No scleral icterus.  Neck: No thyromegaly present.  Cardiovascular: Regular rhythm.  Mild tachycardia.  Pulmonary/Chest: Effort normal and breath sounds normal.  Abdominal: Soft.  Musculoskeletal: He exhibits no edema.  Neurological: He is alert and oriented to person, place, and time. Gait normal. GCS score is 15.  O2 sat is 92%. UA shows2+blood,Nitrites positive,LE positive.  Assessment and Plan :  UTI/Prostatitis Pt wishes  minimal evaluation. Will Rx with Doxycycline--would like to see back in 3 days but he prfers to see Mikki Santee next week. Dehydration Pt /wife encouraged to push fluids. CAD COPD CKD  I have done the exam and reviewed the chart and it is accurate to the best of my knowledge. Development worker, community has been used and  any errors in dictation or transcription are unintentional. Miguel Aschoff M.D. [facility]

## 2017-09-19 ENCOUNTER — Other Ambulatory Visit: Payer: Self-pay | Admitting: Family Medicine

## 2017-09-19 DIAGNOSIS — R319 Hematuria, unspecified: Secondary | ICD-10-CM | POA: Diagnosis not present

## 2017-09-19 DIAGNOSIS — N390 Urinary tract infection, site not specified: Secondary | ICD-10-CM | POA: Diagnosis not present

## 2017-09-22 LAB — URINE CULTURE

## 2017-09-25 ENCOUNTER — Ambulatory Visit (INDEPENDENT_AMBULATORY_CARE_PROVIDER_SITE_OTHER): Payer: Medicare HMO | Admitting: Family Medicine

## 2017-09-25 ENCOUNTER — Encounter: Payer: Self-pay | Admitting: Family Medicine

## 2017-09-25 ENCOUNTER — Telehealth: Payer: Self-pay | Admitting: Emergency Medicine

## 2017-09-25 VITALS — BP 122/72 | HR 114 | Temp 97.9°F | Resp 16 | Wt 176.2 lb

## 2017-09-25 DIAGNOSIS — R319 Hematuria, unspecified: Secondary | ICD-10-CM

## 2017-09-25 DIAGNOSIS — R300 Dysuria: Secondary | ICD-10-CM | POA: Diagnosis not present

## 2017-09-25 DIAGNOSIS — N390 Urinary tract infection, site not specified: Secondary | ICD-10-CM | POA: Diagnosis not present

## 2017-09-25 NOTE — Progress Notes (Signed)
Subjective:     Patient ID: Bobby Peterson, male   DOB: [birth year], 77 y.o.   MRN: [id number] Chief Complaint  Patient presents with  . Urinary Tract Infection    Patient returns back to office for one week follow up after being seen in office 09/18/17 for Prostatitis. At last visit patient was prescribed Doxycycline and urine culture was ordered, patient reports he has not noticed any improvement on medication and reports dysuria.    HPI States today he feels better and has another 3 days on abx. However noticed "air" coming out of his penis on one occasion. Localizes it below his meatus. Prior urine culture with two organisms. Unable to produce another urine sample today. Accompanied by his wife.  Review of Systems     Objective:   Physical Exam  Constitutional: He appears well-developed and well-nourished. No distress.  Genitourinary:  Genitourinary Comments: Penis appears to have a  1-2 mm opening below his meatus. No drainage or erythema noted.       Assessment:    1. Urinary tract infection with hematuria, site unspecified: will bring in sample in the AM - POCT urinalysis dipstick  2. Dysuria: ? fistula  - POCT urinalysis dipstick - Ambulatory referral to Urology    Plan:    Urology referral/ U/A in AM

## 2017-09-25 NOTE — Telephone Encounter (Signed)
-----   Message from Jerrol Banana., MD sent at 09/23/2017 12:58 PM EST ----- Sensitive to Doxy which was Rx.

## 2017-09-25 NOTE — Telephone Encounter (Signed)
Tried to call pt. Call was dropped, then tried to call back and went to VM that was not set up. Will try pt again later.

## 2017-09-25 NOTE — Patient Instructions (Signed)
We will cal you with the urology referral. Please bring by a urine sample in the AM.

## 2017-09-25 NOTE — Telephone Encounter (Signed)
Pt had OV with Mikki Santee today.

## 2017-09-26 ENCOUNTER — Encounter: Payer: Self-pay | Admitting: Family Medicine

## 2017-09-26 ENCOUNTER — Ambulatory Visit: Payer: Medicare HMO | Admitting: Urology

## 2017-09-26 ENCOUNTER — Telehealth: Payer: Self-pay | Admitting: Urology

## 2017-09-26 ENCOUNTER — Other Ambulatory Visit: Payer: Self-pay | Admitting: Family Medicine

## 2017-09-26 ENCOUNTER — Encounter: Payer: Self-pay | Admitting: Urology

## 2017-09-26 VITALS — BP 104/61 | HR 120 | Ht 65.0 in | Wt 176.0 lb

## 2017-09-26 DIAGNOSIS — N476 Balanoposthitis: Secondary | ICD-10-CM | POA: Diagnosis not present

## 2017-09-26 DIAGNOSIS — N3090 Cystitis, unspecified without hematuria: Secondary | ICD-10-CM

## 2017-09-26 DIAGNOSIS — R3989 Other symptoms and signs involving the genitourinary system: Secondary | ICD-10-CM

## 2017-09-26 DIAGNOSIS — R300 Dysuria: Secondary | ICD-10-CM | POA: Diagnosis not present

## 2017-09-26 DIAGNOSIS — N400 Enlarged prostate without lower urinary tract symptoms: Secondary | ICD-10-CM

## 2017-09-26 LAB — POCT URINALYSIS DIPSTICK
BILIRUBIN UA: NEGATIVE
Glucose, UA: NEGATIVE
KETONES UA: NEGATIVE
NITRITE UA: POSITIVE
PH UA: 8.5 — AB (ref 5.0–8.0)
PROTEIN UA: 300
Spec Grav, UA: <=1.005 — AB (ref 1.010–1.025)
Urobilinogen, UA: 0.2 E.U./dL

## 2017-09-26 LAB — URINALYSIS, COMPLETE
BILIRUBIN UA: NEGATIVE
GLUCOSE, UA: NEGATIVE
Ketones, UA: NEGATIVE
NITRITE UA: NEGATIVE
Specific Gravity, UA: 1.02 (ref 1.005–1.030)
UUROB: 0.2 mg/dL (ref 0.2–1.0)
pH, UA: 6.5 (ref 5.0–7.5)

## 2017-09-26 LAB — BLADDER SCAN AMB NON-IMAGING

## 2017-09-26 LAB — MICROSCOPIC EXAMINATION
Epithelial Cells (non renal): NONE SEEN /hpf (ref 0–10)
RBC, UA: >30 /hpf — ABNORMAL HIGH (ref 0–?)

## 2017-09-26 MED ORDER — NYSTATIN-TRIAMCINOLONE 100000-0.1 UNIT/GM-% EX OINT: 1.0000 "application " | TOPICAL_OINTMENT | Freq: Two times a day (BID) | CUTANEOUS | 0 refills | Status: DC

## 2017-09-26 NOTE — Progress Notes (Addendum)
Patient ID: Bobby Peterson, male   DOB: 1941-02-21, 77 y.o.   MRN: [id number]  Patient was seen in office yesterday 09/25/17 with complaints of dysuria,patient was unable to leave specimen at time of visit and wife has dropped off specimen today. Urinalysis and culture have been ordered. KW  Urinalysis    Component Value Date/Time   COLORURINE YELLOW (A) 08/23/2017 2349   APPEARANCEUR CLEAR (A) 08/23/2017 2349   LABSPEC 1.017 08/23/2017 2349   PHURINE 5.0 08/23/2017 2349   GLUCOSEU >=500 (A) 08/23/2017 2349   HGBUR NEGATIVE 08/23/2017 2349   BILIRUBINUR negative 09/26/2017 [place] 08/23/2017 2349   PROTEINUR 300 09/26/2017 0814   PROTEINUR NEGATIVE 08/23/2017 2349   UROBILINOGEN 0.2 09/26/2017 0814   NITRITE positive 09/26/2017 0814   NITRITE NEGATIVE 08/23/2017 2349   LEUKOCYTESUR Large (3+) (A) 09/26/2017 3361

## 2017-09-26 NOTE — Progress Notes (Signed)
09/26/2017 4:38 PM   Bobby Peterson 1940/11/17 [id number]  Referring provider: Carmon Peterson, [place] [place], [place] [address]  Chief Complaint  Patient presents with  . Dysuria    New Patient    HPI: 77 year old male who presents today for further evaluation of his dysuria/UTI/penile pain.  Bobby Peterson presented on 09/05/2017 to his primary care physician complaining of dysuria and increased urinary frequency.  UA was suspicious for infection he was started on Keflex for presumed infection.  Associated urine culture on the day grew 2 organisms, greater than 100,000 colonies but unable to be speciated.    He returned to the office on 09/18/2017 with persistent symptoms.  His UA remained suspicious, nitrate positive.  His antibiotics were switched to doxycycline at that time which he continues today.  Urine culture ultimately grew both E. coli as well as Klebsiella which was sensitive to tetracycline.  He finally returned back to his PCP yesterday.  This point in time, he is complaining of episodes of air expelled from the tip of his penis on several occasions.  This happened several days ago and is since stopped.  On examination, there is some concern about possible fistula.  He reports that his penis has been relatively sore in the distal shaft.  He denies any abdominal pain.  He does continue to have some mild dysuria although it is improving.    He is extremely remote history of renal cell carcinoma status post right nephrectomy.  His most recent scan 2015 shows no evidence of disease.    He does have a personal history of BPH and history of urinary retention.  He is currently on Flomax.  PVR 20 cc.  PMH: Past Medical History:  Diagnosis Date  . Acute respiratory failure with hypoxia ([place]) 12/03/2013   Overview:  Overview:  2 L O2 continuously  Last Assessment & Plan:  Continue 2 L O2 continuously   . Chronic diastolic CHF (congestive heart failure)  ([place])   . Chronic respiratory failure (HCC)    a. on home O2  . COPD (chronic obstructive pulmonary disease) ([place])   . Coronary artery disease   . Diabetes ([place])   . Hernia, umbilical   . Hypercholesteremia   . Hypertension   . Obesity   . Renal cancer [facility])    a. s/p nephrectomy in 1980's    Surgical History: Past Surgical History:  Procedure Laterality Date  . CHOLECYSTECTOMY N/A 12/22/2014   Procedure: LAPAROSCOPIC CHOLECYSTECTOMY WITH INTRAOPERATIVE CHOLANGIOGRAM;  Surgeon: Christene Lye, MD;  Location: [facility] ORS;  Service: General;  Laterality: N/A;  . COLONOSCOPY    . ERCP N/A 01/05/2015   Procedure: ENDOSCOPIC RETROGRADE CHOLANGIOPANCREATOGRAPHY (ERCP);  Surgeon: Hulen Luster, MD;  Location: [facility] ENDOSCOPY;  Service: Endoscopy;  Laterality: N/A;  . ERCP N/A 02/19/2015   Procedure: ENDOSCOPIC RETROGRADE CHOLANGIOPANCREATOGRAPHY (ERCP);  Surgeon: Hulen Luster, MD;  Location: [facility] ENDOSCOPY;  Service: Gastroenterology;  Laterality: N/A;  . GALLBLADDER SURGERY  11/2014  . NEPHRECTOMY  1990  . OMENTECTOMY  12/22/2014   Procedure: OMENTECTOMY;  Surgeon: Christene Lye, MD;  Location: [facility] ORS;  Service: General;;  Partial omentectomy  . STENT REMOVAL    . UMBILICAL HERNIA REPAIR N/A 12/22/2014   Procedure: HERNIA REPAIR UMBILICAL ADULT;  Surgeon: Christene Lye, MD;  Location: [facility] ORS;  Service: General;  Laterality: N/A;    Home Medications:  Allergies as of 09/26/2017      Reactions  Fenofibrate    Other reaction(s): Headache   Lisinopril Cough   Niacin    Other reaction(s): Dizziness   Phenergan [promethazine Hcl]    NVD   Simvastatin    Other reaction(s): Headache      Medication List        Accurate as of 09/26/17  4:38 PM. Always use your most recent med list.          albuterol 108 (90 Base) MCG/ACT inhaler Commonly known as:  PROVENTIL HFA;VENTOLIN HFA Inhale 2 puffs into the lungs every 6 (six) hours as needed for wheezing or shortness of  breath.   aspirin 81 MG tablet Take 81 mg by mouth daily.   doxycycline 100 MG EC tablet Commonly known as:  DORYX Take 100 mg by mouth 2 (two) times daily.   Fluticasone-Salmeterol 250-50 MCG/DOSE Aepb Commonly known as:  ADVAIR DISKUS Inhale 1 puff into the lungs 2 (two) times daily.   furosemide 40 MG tablet Commonly known as:  LASIX TAKE 1 TABLET EVERY DAY   gabapentin 300 MG capsule Commonly known as:  NEURONTIN TAKE 1 CAPSULE TWICE DAILY   metoprolol succinate 100 MG 24 hr tablet Commonly known as:  TOPROL-XL Take 1 tablet (100 mg total) by mouth daily. Take with or immediately following a meal.   nystatin-triamcinolone ointment Commonly known as:  MYCOLOG Apply 1 application topically 2 (two) times daily.   omeprazole 40 MG capsule Commonly known as:  PRILOSEC TAKE 1 CAPSULE DAILY BEFORE BREAKFAST.   OXYGEN Inhale 2 L/min into the lungs continuous.   pravastatin 20 MG tablet Commonly known as:  PRAVACHOL TAKE 1 TABLET EVERY DAY   tamsulosin 0.4 MG Caps capsule Commonly known as:  FLOMAX TAKE 1 CAPSULE EVERY DAY   vitamin B-12 1000 MCG tablet Commonly known as:  CYANOCOBALAMIN Take 1,000 mcg by mouth once a week. *Take on Monday*       Allergies:  Allergies  Allergen Reactions  . Fenofibrate     Other reaction(s): Headache  . Lisinopril Cough  . Niacin     Other reaction(s): Dizziness  . Phenergan [Promethazine Hcl]     NVD  . Simvastatin     Other reaction(s): Headache    Family History: Family History  Problem Relation Age of Onset  . Hypertension Mother     Social History:  reports that he quit smoking about 13 years ago. His smoking use included cigarettes. He has a 50.00 pack-year smoking history. His smokeless tobacco use includes chew. He reports that he does not drink alcohol or use drugs.  ROS: UROLOGY Frequent Urination?: No Hard to postpone urination?: No Burning/pain with urination?: Yes Get up at night to urinate?:  Yes Leakage of urine?: No Urine stream starts and stops?: No Trouble starting stream?: No Do you have to strain to urinate?: No Blood in urine?: No Urinary tract infection?: Yes Sexually transmitted disease?: No Injury to kidneys or bladder?: No Painful intercourse?: No Weak stream?: No Erection problems?: No Penile pain?: Yes  Gastrointestinal Nausea?: No Vomiting?: No Indigestion/heartburn?: No Diarrhea?: No Constipation?: No  Constitutional Fever: No Night sweats?: No Weight loss?: No Fatigue?: No  Skin Skin rash/lesions?: No Itching?: No  Eyes Blurred vision?: No Double vision?: No  Ears/Nose/Throat Sore throat?: No Sinus problems?: No  Hematologic/Lymphatic Swollen glands?: No Easy bruising?: No  Cardiovascular Leg swelling?: No Chest pain?: No  Respiratory Cough?: No Shortness of breath?: No  Endocrine Excessive thirst?: No  Musculoskeletal Back pain?: No Joint pain?:  No  Neurological Headaches?: No Dizziness?: Yes  Psychologic Depression?: No Anxiety?: No  Physical Exam: BP 104/61   Pulse (!) 120   Ht 5\' 5"  (1.651 m)   Wt 176 lb (79.8 kg)   BMI 29.29 kg/m   Constitutional:  Alert and oriented, No acute distress.  Accompanied by wife today. HEENT: [place] AT, moist mucus membranes.  Trachea midline, no masses. Cardiovascular: No clubbing, cyanosis, or edema. Respiratory: Normal respiratory effort, no increased work of breathing.  Wearing oxygen today. GI: Abdomen is soft, nontender, nondistended, no abdominal masses GU: Circumcised phallus with orthotopic meatus.  There is some subtle mild erythema of the ventral aspect of the distal shaft just below the coronal margin.  There is a small area of excoriation and a linear abrasion.  No pitting sinus or drainage appreciated.  No obvious fistulous tract.  Normal urethral meatus. Neurologic: Grossly intact, no focal deficits, moving all 4 extremities. Psychiatric: Normal mood and  affect.  Laboratory Data: Lab Results  Component Value Date   WBC 16.1 (H) 08/23/2017   HGB 13.9 08/23/2017   HCT 43.0 08/23/2017   MCV 85.5 08/23/2017   PLT 268 08/23/2017    Lab Results  Component Value Date   CREATININE 1.50 (H) 08/28/2017     Lab Results  Component Value Date   HGBA1C 7.1 (H) 10/11/2013    Urinalysis Lab Results  Component Value Date   SPECGRAV 1.020 09/26/2017   PHUR 6.5 09/26/2017   COLORU Yellow 09/26/2017   APPEARANCEUR Cloudy (A) 09/26/2017   LEUKOCYTESUR 3+ (A) 09/26/2017   PROTEINUR 3+ (A) 09/26/2017   GLUCOSEU Negative 09/26/2017   KETONESU Negative 09/26/2017   RBCU 3+ (A) 09/26/2017   BILIRUBINUR Negative 09/26/2017   UUROB 0.2 09/26/2017   NITRITE Negative 09/26/2017    Lab Results  Component Value Date   LABMICR See below: 09/26/2017   WBCUA >30 (H) 09/26/2017   RBCUA >30 (H) 09/26/2017   LABEPIT None seen 09/26/2017   MUCUS Present (A) 09/26/2017   BACTERIA Many (A) 09/26/2017    Pertinent Imaging:   Assessment & Plan:   1. Cystitis/pneumaturia Culture proven E. coli/Klebsiella UTI, antibiotics (doxycycline) which should be effective for this particular bacteria We will repeat urine culture today, if antibiotics are appropriate, urine culture should be negative Suspect episode of pneumaturia possibly related to emphysematous cystitis as this is not recurred Differential diagnosis includes colovesical fistula although not suspected given lack of additional symptoms Plan for reevaluation in 2 weeks - Urinalysis, Complete - BLADDER SCAN AMB NON-IMAGING - CULTURE, URINE COMPREHENSIVE  2. Balanoposthitis Erythema involving the shaft adjacent to the coronal margin Suspect local irritation/candidal infection Antifungal/steroid cream for 2 weeks with reevaluation No obvious fistulous tract or any other suspicious lesions at this time If fails to resolve, may consider cystoscopy and/or biopsy  3. 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  Hollice Espy, MD  [facility] [address] [place], [place] [address] [phone]

## 2017-09-26 NOTE — Telephone Encounter (Signed)
Pt was advised to call back if Rx was too expensive, in which case it is for them, it's $90 and they can't afford that.  Spoke with Dr. Erlene Quan, she asks that [facility] call pharmacy to follow up and see what can be Rx cheaper.  Also please advise pt of follow appt that was made per Dr. Erlene Quan, Thanks.

## 2017-09-29 LAB — URINE CULTURE

## 2017-09-29 LAB — CULTURE, URINE COMPREHENSIVE

## 2017-10-02 ENCOUNTER — Telehealth: Payer: Self-pay

## 2017-10-02 MED ORDER — SULFAMETHOXAZOLE-TRIMETHOPRIM 800-160 MG PO TABS: 1.0000 | ORAL_TABLET | Freq: Two times a day (BID) | ORAL | 0 refills | Status: AC

## 2017-10-02 NOTE — Telephone Encounter (Signed)
-----   Message from Hollice Espy, MD sent at 10/01/2017 10:52 AM EST ----- I would like to change his antibiotics to Bactrim DS bid x 10 days.  I looks like his infection is persistent and I'm not sure his current abx are sufficient.   Hollice Espy, MD

## 2017-10-02 NOTE — Telephone Encounter (Signed)
Spoke with pt in reference to bactrim. Made aware abx were sent to pharmacy. Pt voiced understanding stating he would have his wife go pick it up.

## 2017-10-04 ENCOUNTER — Other Ambulatory Visit: Payer: Self-pay | Admitting: Family Medicine

## 2017-10-04 DIAGNOSIS — I5032 Chronic diastolic (congestive) heart failure: Secondary | ICD-10-CM

## 2017-10-05 ENCOUNTER — Other Ambulatory Visit: Payer: Self-pay | Admitting: Family Medicine

## 2017-10-05 DIAGNOSIS — J449 Chronic obstructive pulmonary disease, unspecified: Secondary | ICD-10-CM

## 2017-10-05 MED ORDER — FLUTICASONE-SALMETEROL 250-50 MCG/DOSE IN AEPB: 1.0000 | INHALATION_SPRAY | Freq: Two times a day (BID) | RESPIRATORY_TRACT | 3 refills | Status: DC

## 2017-10-10 ENCOUNTER — Encounter: Payer: Self-pay | Admitting: Urology

## 2017-10-10 ENCOUNTER — Ambulatory Visit: Payer: Medicare HMO | Admitting: Urology

## 2017-10-10 VITALS — BP 110/70 | HR 105 | Ht 65.0 in | Wt 174.0 lb

## 2017-10-10 DIAGNOSIS — N476 Balanoposthitis: Secondary | ICD-10-CM

## 2017-10-10 DIAGNOSIS — N400 Enlarged prostate without lower urinary tract symptoms: Secondary | ICD-10-CM

## 2017-10-10 DIAGNOSIS — N3000 Acute cystitis without hematuria: Secondary | ICD-10-CM

## 2017-10-10 LAB — MICROSCOPIC EXAMINATION: WBC, UA: >30 /hpf — ABNORMAL HIGH (ref 0–?)

## 2017-10-10 LAB — URINALYSIS, COMPLETE
Bilirubin, UA: NEGATIVE
Glucose, UA: NEGATIVE
Nitrite, UA: NEGATIVE
PH UA: 6 (ref 5.0–7.5)
Specific Gravity, UA: >1.03 — ABNORMAL HIGH (ref 1.005–1.030)
UUROB: 0.2 mg/dL (ref 0.2–1.0)

## 2017-10-10 NOTE — Progress Notes (Signed)
10/10/2017 9:14 PM   Bobby Peterson 12-Feb-1941 [id number]  Referring provider: Carmon Ginsberg, [place], [place] [address]  Chief Complaint  Patient presents with  . Other    follow up UTI    HPI: 77 yo M who returns today for recheck of urinary symptoms and penile erythema.    He is currently on bactrim for UTI for Proteus UTI 09/26/17.  He has two more days.  Over the past 2 days, he urinary symptoms are improving significantly with decreased dysuria, urgency and frequency.  He last had pneumaturia 4 days ago and none since.  He has also been using mycaolog cream several times a day.  He is unsure whether its working.    He is extremely remote history of renal cell carcinoma status post right nephrectomy.  His most recent scan 2015 shows no evidence of disease.    He does have a personal history of BPH and history of urinary retention.  He is currently on Flomax.    PMH: Past Medical History:  Diagnosis Date  . Acute respiratory failure with hypoxia ([place]) 12/03/2013   Overview:  Overview:  2 L O2 continuously  Last Assessment & Plan:  Continue 2 L O2 continuously   . Chronic diastolic CHF (congestive heart failure) ([place])   . Chronic respiratory failure (HCC)    a. on home O2  . COPD (chronic obstructive pulmonary disease) ([place])   . Coronary artery disease   . Diabetes ([place])   . Hernia, umbilical   . Hypercholesteremia   . Hypertension   . Obesity   . Renal cancer [facility])    a. s/p nephrectomy in 1980's    Surgical History: Past Surgical History:  Procedure Laterality Date  . CHOLECYSTECTOMY N/A 12/22/2014   Procedure: LAPAROSCOPIC CHOLECYSTECTOMY WITH INTRAOPERATIVE CHOLANGIOGRAM;  Surgeon: Christene Lye, MD;  Location: [facility] ORS;  Service: General;  Laterality: N/A;  . COLONOSCOPY    . ERCP N/A 01/05/2015   Procedure: ENDOSCOPIC RETROGRADE CHOLANGIOPANCREATOGRAPHY (ERCP);  Surgeon: Hulen Luster, MD;  Location: [facility] ENDOSCOPY;   Service: Endoscopy;  Laterality: N/A;  . ERCP N/A 02/19/2015   Procedure: ENDOSCOPIC RETROGRADE CHOLANGIOPANCREATOGRAPHY (ERCP);  Surgeon: Hulen Luster, MD;  Location: [facility] ENDOSCOPY;  Service: Gastroenterology;  Laterality: N/A;  . GALLBLADDER SURGERY  11/2014  . NEPHRECTOMY  1990  . OMENTECTOMY  12/22/2014   Procedure: OMENTECTOMY;  Surgeon: Christene Lye, MD;  Location: [facility] ORS;  Service: General;;  Partial omentectomy  . STENT REMOVAL    . UMBILICAL HERNIA REPAIR N/A 12/22/2014   Procedure: HERNIA REPAIR UMBILICAL ADULT;  Surgeon: Christene Lye, MD;  Location: [facility] ORS;  Service: General;  Laterality: N/A;    Home Medications:  Allergies as of 10/10/2017      Reactions   Fenofibrate    Other reaction(s): Headache   Lisinopril Cough   Niacin    Other reaction(s): Dizziness   Phenergan [promethazine Hcl]    NVD   Simvastatin    Other reaction(s): Headache      Medication List        Accurate as of 10/10/17  9:14 PM. Always use your most recent med list.          albuterol 108 (90 Base) MCG/ACT inhaler Commonly known as:  PROVENTIL HFA;VENTOLIN HFA Inhale 2 puffs into the lungs every 6 (six) hours as needed for wheezing or shortness of breath.   aspirin 81 MG tablet Take 81 mg by mouth  daily.   doxycycline 100 MG EC tablet Commonly known as:  DORYX Take 100 mg by mouth 2 (two) times daily.   Fluticasone-Salmeterol 250-50 MCG/DOSE Aepb Commonly known as:  ADVAIR DISKUS Inhale 1 puff into the lungs 2 (two) times daily.   furosemide 40 MG tablet Commonly known as:  LASIX TAKE 1 TABLET EVERY DAY   gabapentin 300 MG capsule Commonly known as:  NEURONTIN TAKE 1 CAPSULE TWICE DAILY   metoprolol succinate 100 MG 24 hr tablet Commonly known as:  TOPROL-XL TAKE 1 TABLET DAILY WITH OR IMMEDIATELY FOLLOWING A MEAL.   nystatin-triamcinolone ointment Commonly known as:  MYCOLOG Apply 1 application topically 2 (two) times daily.   omeprazole 40 MG  capsule Commonly known as:  PRILOSEC TAKE 1 CAPSULE DAILY BEFORE BREAKFAST.   OXYGEN Inhale 2 L/min into the lungs continuous.   pravastatin 20 MG tablet Commonly known as:  PRAVACHOL TAKE 1 TABLET EVERY DAY   sulfamethoxazole-trimethoprim 800-160 MG tablet Commonly known as:  BACTRIM DS,SEPTRA DS Take 1 tablet by mouth 2 (two) times daily for 10 days.   tamsulosin 0.4 MG Caps capsule Commonly known as:  FLOMAX TAKE 1 CAPSULE EVERY DAY   vitamin B-12 1000 MCG tablet Commonly known as:  CYANOCOBALAMIN Take 1,000 mcg by mouth once a week. *Take on Monday*       Allergies:  Allergies  Allergen Reactions  . Fenofibrate     Other reaction(s): Headache  . Lisinopril Cough  . Niacin     Other reaction(s): Dizziness  . Phenergan [Promethazine Hcl]     NVD  . Simvastatin     Other reaction(s): Headache    Family History: Family History  Problem Relation Age of Onset  . Hypertension Mother     Social History:  reports that he quit smoking about 13 years ago. His smoking use included cigarettes. He has a 50.00 pack-year smoking history. His smokeless tobacco use includes chew. He reports that he does not drink alcohol or use drugs.  ROS: UROLOGY Frequent Urination?: Yes Hard to postpone urination?: Yes Burning/pain with urination?: Yes Get up at night to urinate?: Yes Leakage of urine?: No Urine stream starts and stops?: No Trouble starting stream?: Yes Do you have to strain to urinate?: No Blood in urine?: No Urinary tract infection?: Yes Sexually transmitted disease?: No Injury to kidneys or bladder?: No Painful intercourse?: No Weak stream?: No Erection problems?: No Penile pain?: No  Gastrointestinal Nausea?: No Vomiting?: No Indigestion/heartburn?: No Diarrhea?: No Constipation?: No  Constitutional Fever: No Night sweats?: No Weight loss?: No Fatigue?: No  Skin Skin rash/lesions?: No Itching?: No  Eyes Blurred vision?: No Double vision?:  No  Ears/Nose/Throat Sore throat?: No Sinus problems?: No  Hematologic/Lymphatic Swollen glands?: No Easy bruising?: Yes  Cardiovascular Leg swelling?: Yes Chest pain?: No  Respiratory Cough?: No Shortness of breath?: Yes  Endocrine Excessive thirst?: Yes  Musculoskeletal Back pain?: No Joint pain?: No  Neurological Headaches?: No Dizziness?: No  Psychologic Depression?: No Anxiety?: No  Physical Exam: BP 110/70   Pulse (!) 105   Ht 5\' 5"  (1.651 m)   Wt 174 lb (78.9 kg)   BMI 28.96 kg/m   Constitutional:  Alert and oriented, No acute distress.  Accompanied by wife today. HEENT: [place] AT, moist mucus membranes.  Trachea midline, no masses. Cardiovascular: No clubbing, cyanosis, or edema. Respiratory: Normal respiratory effort, no increased work of breathing.  Wearing 02.   GI: Abdomen is soft, nontender, nondistended, no abdominal masses GU:  Normal phallus with complete resolution of erythema.  Skin: No rashes, bruises or suspicious lesions. Neurologic: Grossly intact, no focal deficits, moving all 4 extremities. Psychiatric: Normal mood and affect.  Laboratory Data: Lab Results  Component Value Date   WBC 16.1 (H) 08/23/2017   HGB 13.9 08/23/2017   HCT 43.0 08/23/2017   MCV 85.5 08/23/2017   PLT 268 08/23/2017    Lab Results  Component Value Date   CREATININE 1.50 (H) 08/28/2017    Lab Results  Component Value Date   HGBA1C 7.1 (H) 10/11/2013    Urinalysis Results for orders placed or performed in visit on 10/10/17  Microscopic Examination  Result Value Ref Range   WBC, UA >30 (H) 0 - 5 /hpf   RBC, UA 11-30 (A) 0 - 2 /hpf   Epithelial Cells (non renal) 0-10 0 - 10 /hpf   Mucus, UA Present (A) Not Estab.   Bacteria, UA Moderate (A) None seen/Few  Urinalysis, Complete  Result Value Ref Range   Specific Gravity, UA >1.030 (H) 1.005 - 1.030   pH, UA 6.0 5.0 - 7.5   Color, UA Yellow Yellow   Appearance Ur Cloudy (A) Clear   Leukocytes, UA  2+ (A) Negative   Protein, UA 2+ (A) Negative/Trace   Glucose, UA Negative Negative   Ketones, UA Trace (A) Negative   RBC, UA 2+ (A) Negative   Bilirubin, UA Negative Negative   Urobilinogen, Ur 0.2 0.2 - 1.0 mg/dL   Nitrite, UA Negative Negative   Microscopic Examination See below:     Pertinent Imaging: N/a  Assessment & Plan:    1. Acute cystitis without hematuria Symptoms improving Plan to repeat UCx to ensure resolution of infection Resolution of pneumaturia- will consider cysto vs.  CT scan if recurs to evaluation for fisturla  Complete abx course - CULTURE, URINE COMPREHENSIVE - Urinalysis, Complete  2. Balanoposthitis Resolved   3. Benign prostatic hyperplasia without lower urinary tract symptoms Continue Flomax   Return in about 1 month (around 11/10/2017) for UA/ possible cysto if still having pneumaturia.  Hollice Espy, MD  [facility] [address] [place], [place] [address] [phone]

## 2017-10-12 LAB — CULTURE, URINE COMPREHENSIVE

## 2017-10-13 DIAGNOSIS — J449 Chronic obstructive pulmonary disease, unspecified: Secondary | ICD-10-CM | POA: Diagnosis not present

## 2017-10-14 DIAGNOSIS — J449 Chronic obstructive pulmonary disease, unspecified: Secondary | ICD-10-CM | POA: Diagnosis not present

## 2017-11-09 ENCOUNTER — Telehealth: Payer: Self-pay | Admitting: *Deleted

## 2017-11-09 NOTE — Telephone Encounter (Signed)
Patient's wife [name] called office stating patient has had diarrhea and vomiting all day. Patient has no fever, dizzy and no abdominal pain. [name] just wanted to let Bobby Peterson know and that if pt is not better by tomorrow she will bring him in for appt. Bobby Peterson if symptoms worsen take pt to ER.

## 2017-11-13 DIAGNOSIS — J449 Chronic obstructive pulmonary disease, unspecified: Secondary | ICD-10-CM | POA: Diagnosis not present

## 2017-11-14 DIAGNOSIS — J449 Chronic obstructive pulmonary disease, unspecified: Secondary | ICD-10-CM | POA: Diagnosis not present

## 2017-11-16 ENCOUNTER — Ambulatory Visit: Payer: Medicare HMO | Admitting: Urology

## 2017-11-16 ENCOUNTER — Encounter: Payer: Self-pay | Admitting: Urology

## 2017-11-16 VITALS — BP 106/71 | HR 109 | Ht 65.0 in | Wt 177.0 lb

## 2017-11-16 DIAGNOSIS — N3020 Other chronic cystitis without hematuria: Secondary | ICD-10-CM

## 2017-11-16 DIAGNOSIS — R3989 Other symptoms and signs involving the genitourinary system: Secondary | ICD-10-CM

## 2017-11-16 LAB — URINALYSIS, COMPLETE
BILIRUBIN UA: NEGATIVE
Glucose, UA: NEGATIVE
KETONES UA: NEGATIVE
Nitrite, UA: NEGATIVE
PH UA: 7 (ref 5.0–7.5)
SPEC GRAV UA: 1.02 (ref 1.005–1.030)
UUROB: 0.2 mg/dL (ref 0.2–1.0)

## 2017-11-16 LAB — MICROSCOPIC EXAMINATION
EPITHELIAL CELLS (NON RENAL): NONE SEEN /HPF (ref 0–10)
RBC, UA: >30 /hpf — ABNORMAL HIGH (ref 0–2)

## 2017-11-16 NOTE — Progress Notes (Signed)
11/16/17  CC:  Chief Complaint  Patient presents with  . Cysto    HPI: 77 year old male with recurrent urinary tract infections, persistent pneumaturia who presents today for cystoscopy for further evaluation.  He continues to have dysuria today as well as pneumaturia as recently as a few days ago.  He has not had a colonoscopy in over 20 years.  He is refused to have any more.  Blood pressure 106/71, pulse (!) 109, height 5\' 5"  (1.651 m), weight 177 lb (80.3 kg). NED. A&Ox3.   No respiratory distress   Abd soft, NT, ND Normal phallus with bilateral descended testicles  Results for orders placed or performed in visit on 11/16/17  Microscopic Examination  Result Value Ref Range   WBC, UA >30 (H) 0 - 5 /hpf   RBC, UA >30 (H) 0 - 2 /hpf   Epithelial Cells (non renal) None seen 0 - 10 /hpf   Mucus, UA Present (A) Not Estab.   Bacteria, UA Many (A) None seen/Few  Urinalysis, Complete  Result Value Ref Range   Specific Gravity, UA 1.020 1.005 - 1.030   pH, UA 7.0 5.0 - 7.5   Color, UA Yellow Yellow   Appearance Ur Cloudy (A) Clear   Leukocytes, UA 3+ (A) Negative   Protein, UA 2+ (A) Negative/Trace   Glucose, UA Negative Negative   Ketones, UA Negative Negative   RBC, UA 3+ (A) Negative   Bilirubin, UA Negative Negative   Urobilinogen, Ur 0.2 0.2 - 1.0 mg/dL   Nitrite, UA Negative Negative   Microscopic Examination See below:     Cystoscopy Procedure Note  Patient identification was confirmed, informed consent was obtained, and patient was prepped using Betadine solution.  Lidocaine jelly was administered per urethral meatus.    Preoperative abx where received prior to procedure.     Pre-Procedure: - Inspection reveals a normal caliber ureteral meatus.  Procedure: The flexible cystoscope was introduced without difficulty - No urethral strictures/lesions are present. - Normal prostate with trilobar" - Normal bladder neck - Bilateral ureteral orifices  identified - Bladder mucosa highly suspicious area on the left posterior dome of the bladder with bullous edema, raised, highly concerning for fistulous tract.  The area is approximately 1-2 cm in diameter.  There is no papillary changes associated with this.  There is a nonspecific erythema throughout the remainder of the bladder. - No bladder stones - No trabeculation  Retroflexion unremarkable.   Post-Procedure: - Patient tolerated the procedure well  A photograph of the cystoscopic findings received in the patient's chart under media.  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  He will return following the CT scan to review the results.  Otherwise continue supportive care.  - Urinalysis, Complete - CT Abdomen Pelvis W Contrast; Future  2. Pneumaturia As above  Notably today, the patient is quite stubborn.  He tentatively agreed to have the CT scan but reports that he is not willing to have any sort of surgery.  I encouraged him to pursue CT scan at minimal for diagnostic evaluation.  His wife is also present today and understands the rationale behind my recommendations.  Return in about 2 weeks (around 11/30/2017) for f/u ct scan.  Hollice Espy, MD

## 2017-12-08 ENCOUNTER — Ambulatory Visit
Admission: RE | Admit: 2017-12-08 | Discharge: 2017-12-08 | Disposition: A | Discharge status: Home / Self Care | Payer: Medicare HMO | Source: Ambulatory Visit | Attending: Urology | Admitting: Urology

## 2017-12-08 DIAGNOSIS — N321 Vesicointestinal fistula: Secondary | ICD-10-CM | POA: Diagnosis not present

## 2017-12-08 DIAGNOSIS — N3020 Other chronic cystitis without hematuria: Secondary | ICD-10-CM | POA: Insufficient documentation

## 2017-12-08 DIAGNOSIS — K449 Diaphragmatic hernia without obstruction or gangrene: Secondary | ICD-10-CM | POA: Diagnosis not present

## 2017-12-08 DIAGNOSIS — Z905 Acquired absence of kidney: Secondary | ICD-10-CM | POA: Diagnosis not present

## 2017-12-08 LAB — POCT I-STAT CREATININE: Creatinine, Ser: 1.5 mg/dL — ABNORMAL HIGH (ref 0.61–1.24)

## 2017-12-08 MED ORDER — IOPAMIDOL (ISOVUE-300) INJECTION 61%: 85.0000 mL | Freq: Once | INTRAVENOUS | Status: DC | PRN

## 2017-12-08 MED ORDER — IOPAMIDOL (ISOVUE-300) INJECTION 61%
70.0000 mL | Freq: Once | INTRAVENOUS | Status: AC | PRN
  Administered 2017-12-08: 70 mL via INTRAVENOUS

## 2017-12-11 ENCOUNTER — Telehealth: Payer: Self-pay | Admitting: Urology

## 2017-12-12 ENCOUNTER — Encounter: Payer: Self-pay | Admitting: Urology

## 2017-12-12 ENCOUNTER — Ambulatory Visit: Payer: Medicare HMO | Admitting: Urology

## 2017-12-12 VITALS — BP 116/71 | HR 98 | Resp 16 | Ht 65.0 in | Wt 180.6 lb

## 2017-12-12 DIAGNOSIS — N321 Vesicointestinal fistula: Secondary | ICD-10-CM | POA: Diagnosis not present

## 2017-12-12 NOTE — Progress Notes (Signed)
12/12/2017 5:04 PM   Bobby Peterson 04/24/1941 [id number]  Referring provider: Carmon Ginsberg, [place], [place] [address]  Chief Complaint  Patient presents with  . Follow-up    CT scan    HPI: 77 year old male who presents today for follow-up CT scan to confirm the diagnosis of colovesical fistula.  He initially presented with recurrent urinary tract infections and pneumaturia.  He underwent cystoscopy on 11/16/2017 with a area in the left dome of the bladder consistent with a fistulous tract.  Since undergone CT scan confirming the diagnosis, probable underlying diverticulitis is the cause.  He has not had a colonoscopy in over 20 years.  He is refused to have any more.  He does have multiple medical comorbidities and is very hesitant to proceed with any surgical intervention.  He continues to have dysuria and pneumaturia.  No fevers or chills.   PMH: Past Medical History:  Diagnosis Date  . Acute respiratory failure with hypoxia ([place]) 12/03/2013   Overview:  Overview:  2 L O2 continuously  Last Assessment & Plan:  Continue 2 L O2 continuously   . Chronic diastolic CHF (congestive heart failure) ([place])   . Chronic respiratory failure (HCC)    a. on home O2  . COPD (chronic obstructive pulmonary disease) ([place])   . Coronary artery disease   . Hernia, umbilical   . Hypercholesteremia   . Hypertension   . Obesity   . Renal cancer [facility])    a. s/p nephrectomy in 1980's    Surgical History: Past Surgical History:  Procedure Laterality Date  . CHOLECYSTECTOMY N/A 12/22/2014   Procedure: LAPAROSCOPIC CHOLECYSTECTOMY WITH INTRAOPERATIVE CHOLANGIOGRAM;  Surgeon: Christene Lye, MD;  Location: [facility] ORS;  Service: General;  Laterality: N/A;  . COLONOSCOPY    . ERCP N/A 01/05/2015   Procedure: ENDOSCOPIC RETROGRADE CHOLANGIOPANCREATOGRAPHY (ERCP);  Surgeon: Hulen Luster, MD;  Location: [facility] ENDOSCOPY;  Service: Endoscopy;  Laterality: N/A;  .  ERCP N/A 02/19/2015   Procedure: ENDOSCOPIC RETROGRADE CHOLANGIOPANCREATOGRAPHY (ERCP);  Surgeon: Hulen Luster, MD;  Location: [facility] ENDOSCOPY;  Service: Gastroenterology;  Laterality: N/A;  . GALLBLADDER SURGERY  11/2014  . NEPHRECTOMY  1990  . OMENTECTOMY  12/22/2014   Procedure: OMENTECTOMY;  Surgeon: Christene Lye, MD;  Location: [facility] ORS;  Service: General;;  Partial omentectomy  . STENT REMOVAL    . UMBILICAL HERNIA REPAIR N/A 12/22/2014   Procedure: HERNIA REPAIR UMBILICAL ADULT;  Surgeon: Christene Lye, MD;  Location: [facility] ORS;  Service: General;  Laterality: N/A;    Home Medications:  Allergies as of 12/12/2017      Reactions   Fenofibrate    Other reaction(s): Headache   Lisinopril Cough   Niacin    Other reaction(s): Dizziness   Phenergan [promethazine Hcl]    NVD   Simvastatin    Other reaction(s): Headache      Medication List        Accurate as of 12/12/17  5:04 PM. Always use your most recent med list.          albuterol 108 (90 Base) MCG/ACT inhaler Commonly known as:  PROVENTIL HFA;VENTOLIN HFA Inhale 2 puffs into the lungs every 6 (six) hours as needed for wheezing or shortness of breath.   aspirin 81 MG tablet Take 81 mg by mouth daily.   Fluticasone-Salmeterol 250-50 MCG/DOSE Aepb Commonly known as:  ADVAIR DISKUS Inhale 1 puff into the lungs 2 (two) times daily.   furosemide 40  MG tablet Commonly known as:  LASIX TAKE 1 TABLET EVERY DAY   gabapentin 300 MG capsule Commonly known as:  NEURONTIN TAKE 1 CAPSULE TWICE DAILY   metoprolol succinate 100 MG 24 hr tablet Commonly known as:  TOPROL-XL TAKE 1 TABLET DAILY WITH OR IMMEDIATELY FOLLOWING A MEAL.   nystatin-triamcinolone ointment Commonly known as:  MYCOLOG Apply 1 application topically 2 (two) times daily.   omeprazole 40 MG capsule Commonly known as:  PRILOSEC TAKE 1 CAPSULE DAILY BEFORE BREAKFAST.   OXYGEN Inhale 2 L/min into the lungs continuous.   pravastatin 20 MG  tablet Commonly known as:  PRAVACHOL TAKE 1 TABLET EVERY DAY   tamsulosin 0.4 MG Caps capsule Commonly known as:  FLOMAX TAKE 1 CAPSULE EVERY DAY   vitamin B-12 1000 MCG tablet Commonly known as:  CYANOCOBALAMIN Take 1,000 mcg by mouth once a week. *Take on Monday*       Allergies:  Allergies  Allergen Reactions  . Fenofibrate     Other reaction(s): Headache  . Lisinopril Cough  . Niacin     Other reaction(s): Dizziness  . Phenergan [Promethazine Hcl]     NVD  . Simvastatin     Other reaction(s): Headache    Family History: Family History  Problem Relation Age of Onset  . Hypertension Mother     Social History:  reports that he quit smoking about 14 years ago. His smoking use included cigarettes. He has a 50.00 pack-year smoking history. His smokeless tobacco use includes chew. He reports that he does not drink alcohol or use drugs.  ROS: UROLOGY Frequent Urination?: No Hard to postpone urination?: No Burning/pain with urination?: Yes Get up at night to urinate?: No Leakage of urine?: No Urine stream starts and stops?: No Trouble starting stream?: No Do you have to strain to urinate?: No Blood in urine?: No Urinary tract infection?: Yes Sexually transmitted disease?: No Injury to kidneys or bladder?: No Painful intercourse?: No Weak stream?: No Erection problems?: No Penile pain?: No  Gastrointestinal Nausea?: No Vomiting?: No Indigestion/heartburn?: No Diarrhea?: No Constipation?: No  Constitutional Fever: No Night sweats?: No Weight loss?: No Fatigue?: No  Skin Skin rash/lesions?: No Itching?: No  Eyes Blurred vision?: No Double vision?: No  Ears/Nose/Throat Sore throat?: No Sinus problems?: No  Hematologic/Lymphatic Swollen glands?: No Easy bruising?: No  Cardiovascular Leg swelling?: No Chest pain?: No  Respiratory Cough?: No Shortness of breath?: No  Endocrine Excessive thirst?: No  Musculoskeletal Back pain?:  No Joint pain?: No  Neurological Headaches?: No Dizziness?: No  Psychologic Depression?: No Anxiety?: No  Physical Exam: BP 116/71   Pulse 98   Resp 16   Ht 5\' 5"  (1.651 m)   Wt 180 lb 9.6 oz (81.9 kg)   SpO2 98%   BMI 30.05 kg/m   Constitutional:  Alert and oriented, No acute distress.  Accompanied by wife today.  Wearing O2. HEENT: [place] AT, moist mucus membranes.  Trachea midline, no masses. Cardiovascular: No clubbing, cyanosis, or edema. Respiratory: Normal respiratory effort, no increased work of breathing. Skin: No rashes, bruises or suspicious lesions. Neurologic: Grossly intact, no focal deficits, moving all 4 extremities. Psychiatric: Normal mood and affect.  In a more pleasant mood today.  Laboratory Data: Lab Results  Component Value Date   WBC 16.1 (H) 08/23/2017   HGB 13.9 08/23/2017   HCT 43.0 08/23/2017   MCV 85.5 08/23/2017   PLT 268 08/23/2017    Lab Results  Component Value Date   CREATININE  1.50 (H) 12/08/2017    Lab Results  Component Value Date   HGBA1C 7.1 (H) 10/11/2013    Urinalysis N/a  Pertinent Imaging: CLINICAL DATA:  Dysuria, penile pain, pneumaturia  EXAM: CT ABDOMEN AND PELVIS WITH CONTRAST  TECHNIQUE: Multidetector CT imaging of the abdomen and pelvis was performed using the standard protocol following bolus administration of intravenous contrast.  CONTRAST:  23mL ISOVUE-300 IOPAMIDOL (ISOVUE-300) INJECTION 61%  COMPARISON:  CT abdomen dated 01/12/2015. CT abdomen/pelvis dated 04/08/2014.  FINDINGS: Lower chest: Calcified granuloma at the left lung base (series 3/image 11), benign.  Hepatobiliary: Liver is within normal limits.  Status post cholecystectomy. No intrahepatic or extrahepatic ductal dilatation.  Pancreas: Within normal limits.  Spleen: Calcified splenic granulomata.  Adrenals/Urinary Tract: Right adrenal gland is not discretely visualized and likely surgically absent. Left adrenal  gland is within normal limits.  Status post right nephrectomy. No abnormal soft tissue in the surgical bed.  2.0 cm hyperdense/hemorrhagic anterior left lower pole renal cyst (series 9/image 40). Three nonobstructing left lower pole renal calculi measuring up to 4 mm. No hydronephrosis.  Thick-walled bladder with left anterior bladder wall thickening (series 9/image 67) and nondependent gas secondary to direct fistulous communication from the sigmoid colon (sagittal image 95).  Stomach/Bowel: Stomach is notable for a small hiatal hernia.  No evidence of bowel obstruction.  Normal appendix (series 9/image 47).  Extensive colonic diverticulosis.  Direct fistulous communication between sigmoid colon and bladder (sagittal image 95), as noted above, likely related to recent/prior sigmoid diverticulitis.  Vascular/Lymphatic: No evidence of abdominal aortic aneurysm.  Atherosclerotic calcifications of the abdominal aorta and branch vessels.  No suspicious abdominopelvic lymphadenopathy.  Reproductive: Prostate is grossly unremarkable.  Other: No abdominopelvic ascites.  Musculoskeletal: Degenerative changes of the visualized thoracolumbar spine.  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  Secondary bladder wall thickening with nondependent gas.  Status post right nephrectomy with adrenalectomy. No evidence of recurrent or metastatic disease.  Additional ancillary findings as above.  These results will be called to the ordering clinician or representative by the Radiologist Assistant, and communication documented in the PACS or zVision Dashboard.   Electronically Signed   By: Julian Hy M.D.   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  The best option for this patient would be colovesical fistula repair, however, he is a fairly poor surgical candidate with multiple medical comorbidities on oxygen therapy.  I will refer him to general surgeon for further evaluation.  We discussed today that it is unlikely that the fistula will heal spontaneously given its chronicity and size.  He would likely continue to have persistent urinary symptoms with occasional episodes of sepsis.  We reviewed signs and symptoms of systemic infections and when to treat.  All questions were answered today.  Plan for general surgery referral.  - Ambulatory referral to [place], [place] [address], [place], [place] [address] [phone]

## 2017-12-13 DIAGNOSIS — J449 Chronic obstructive pulmonary disease, unspecified: Secondary | ICD-10-CM | POA: Diagnosis not present

## 2017-12-14 DIAGNOSIS — J449 Chronic obstructive pulmonary disease, unspecified: Secondary | ICD-10-CM | POA: Diagnosis not present

## 2017-12-21 NOTE — Telephone Encounter (Signed)
error 

## 2018-01-02 ENCOUNTER — Other Ambulatory Visit: Payer: Self-pay | Admitting: Family Medicine

## 2018-01-02 DIAGNOSIS — J449 Chronic obstructive pulmonary disease, unspecified: Secondary | ICD-10-CM

## 2018-01-03 ENCOUNTER — Encounter: Payer: Self-pay | Admitting: Surgery

## 2018-01-03 ENCOUNTER — Ambulatory Visit: Payer: Medicare HMO | Admitting: Surgery

## 2018-01-03 VITALS — BP 127/78 | HR 114 | Temp 97.8°F | Ht 65.0 in | Wt 177.0 lb

## 2018-01-03 DIAGNOSIS — N321 Vesicointestinal fistula: Secondary | ICD-10-CM

## 2018-01-03 MED ORDER — CIPROFLOXACIN HCL 500 MG PO TABS: 500.0000 mg | ORAL_TABLET | Freq: Two times a day (BID) | ORAL | 0 refills | Status: DC

## 2018-01-03 MED ORDER — METRONIDAZOLE 500 MG PO TABS: 500.0000 mg | ORAL_TABLET | Freq: Three times a day (TID) | ORAL | 0 refills | Status: DC

## 2018-01-03 NOTE — Patient Instructions (Addendum)
Please give [place] a call in case you want to move forward with surgery.  We will send you a prescription of your antibiotic to your pharmacy [facility] at [place].

## 2018-01-03 NOTE — Progress Notes (Signed)
Patient ID: Bobby Peterson, male   DOB: [birth year], 77 y.o.   MRN: [id number]  HPI DASHAWN Peterson is a 77 y.o. male seen in consultation at the request of Dr. Erlene Quan for a colovesical fistula.  He reports significant suprapubic pain for several weeks.  He does have pneumaturia.  He reports that his pain is intermittent mild to moderate intensity and dull type.  No specific aggravating or alleviating factors. Does have significant medical history including COPD on home oxygen, heart failure, coronary artery disease, chronic renal insufficiency status post a right radical nephrectomy. He has significant dyspnea on exertion.  HE has not have a colonoscopy in 20 years and refuses to have one. I have personally review his CT scan showing evidence of some chronic diverticulitis with air in the bladder and colovesical fistula.  No evidence of free air.   HPI  Past Medical History:  Diagnosis Date  . Acute respiratory failure with hypoxia ([place]) 12/03/2013   Overview:  Overview:  2 L O2 continuously  Last Assessment & Plan:  Continue 2 L O2 continuously   . Atrial tachycardia ([place]) 04/21/2016  . 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 BP (high blood pressure) 07/08/2015  . Chronic diastolic CHF (congestive heart failure) ([place])   . Chronic diastolic heart failure ([place]) 07/08/2015  . Chronic hypoxemic respiratory failure ([place]) 12/03/2013   2 L O2 continuously   . Chronic respiratory failure (HCC)    a. on home O2  . COPD (chronic obstructive pulmonary disease) ([place])   . COPD (chronic obstructive pulmonary disease) ([place]) 12/03/2013   May fifth 2015 simple spirometry>> ratio 45%, FEV1 0.95 L (34% per day)   . Coronary artery disease   . Coronary atherosclerosis 07/08/2015   Dr. Ubaldo Glassing   . Esophageal reflux 07/08/2015  . Hernia, umbilical   . Hypercholesteremia   . Hypertension   . Obesity   . Renal cancer ([place])    a. s/p nephrectomy in 1980's  . Smoking 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 pack years 05/10/2016    Past  Surgical History:  Procedure Laterality Date  . CHOLECYSTECTOMY N/A 12/22/2014   Procedure: LAPAROSCOPIC CHOLECYSTECTOMY WITH INTRAOPERATIVE CHOLANGIOGRAM;  Surgeon: Christene Lye, MD;  Location: [facility] ORS;  Service: General;  Laterality: N/A;  . COLONOSCOPY    . ERCP N/A 01/05/2015   Procedure: ENDOSCOPIC RETROGRADE CHOLANGIOPANCREATOGRAPHY (ERCP);  Surgeon: Hulen Luster, MD;  Location: [facility] ENDOSCOPY;  Service: Endoscopy;  Laterality: N/A;  . ERCP N/A 02/19/2015   Procedure: ENDOSCOPIC RETROGRADE CHOLANGIOPANCREATOGRAPHY (ERCP);  Surgeon: Hulen Luster, MD;  Location: [facility] ENDOSCOPY;  Service: Gastroenterology;  Laterality: N/A;  . GALLBLADDER SURGERY  11/2014  . NEPHRECTOMY  1990  . OMENTECTOMY  12/22/2014   Procedure: OMENTECTOMY;  Surgeon: Christene Lye, MD;  Location: [facility] ORS;  Service: General;;  Partial omentectomy  . STENT REMOVAL    . UMBILICAL HERNIA REPAIR N/A 12/22/2014   Procedure: HERNIA REPAIR UMBILICAL ADULT;  Surgeon: Christene Lye, MD;  Location: [facility] ORS;  Service: General;  Laterality: N/A;    Family History  Problem Relation Age of Onset  . Hypertension Mother     Social History Social History   Tobacco Use  . Smoking status: Former Smoker    Packs/day: 1.00    Years: 50.00    Pack years: 50.00    Types: Cigarettes    Last attempt to quit: 12/04/2003    Years since quitting: 14.0  . Smokeless tobacco: Current User    Types:  Chew  . Tobacco comment: Has been chewing tobacco since quitting smoking in 2005.  Substance Use Topics  . Alcohol use: No  . Drug use: No    Allergies  Allergen Reactions  . Fenofibrate     Other reaction(s): Headache  . Lisinopril Cough  . Niacin     Other reaction(s): Dizziness  . Phenergan [Promethazine Hcl]     NVD  . Simvastatin     Other reaction(s): Headache    Current Outpatient Medications  Medication Sig Dispense Refill  . albuterol (PROVENTIL HFA;VENTOLIN HFA) 108 (90 Base) MCG/ACT inhaler Inhale 2  puffs into the lungs every 6 (six) hours as needed for wheezing or shortness of breath. 1 Inhaler 2  . aspirin 81 MG tablet Take 81 mg by mouth daily.    . Fluticasone-Salmeterol (ADVAIR) 250-50 MCG/DOSE AEPB INHALE 1 PUFF TWICE DAILY 180 each 3  . furosemide (LASIX) 40 MG tablet TAKE 1 TABLET EVERY DAY 90 tablet 3  . gabapentin (NEURONTIN) 300 MG capsule TAKE 1 CAPSULE TWICE DAILY 180 capsule 3  . metoprolol succinate (TOPROL-XL) 100 MG 24 hr tablet TAKE 1 TABLET DAILY WITH OR IMMEDIATELY FOLLOWING A MEAL. 90 tablet 3  . nystatin-triamcinolone ointment (MYCOLOG) Apply 1 application topically 2 (two) times daily. 30 g 0  . omeprazole (PRILOSEC) 40 MG capsule TAKE 1 CAPSULE DAILY BEFORE BREAKFAST. 90 capsule 3  . OXYGEN Inhale 2 L/min into the lungs continuous.     . pravastatin (PRAVACHOL) 20 MG tablet TAKE 1 TABLET EVERY DAY 90 tablet 3  . tamsulosin (FLOMAX) 0.4 MG CAPS capsule TAKE 1 CAPSULE EVERY DAY 90 capsule 3  . vitamin B-12 (CYANOCOBALAMIN) 1000 MCG tablet Take 1,000 mcg by mouth once a week. *Take on Monday*    . ciprofloxacin (CIPRO) 500 MG tablet Take 1 tablet (500 mg total) by mouth 2 (two) times daily. 14 tablet 0  . metroNIDAZOLE (FLAGYL) 500 MG tablet Take 1 tablet (500 mg total) by mouth 3 (three) times daily. 21 tablet 0   No current facility-administered medications for this visit.      Review of Systems Full ROS  was asked and was negative except for the information on the HPI  Physical Exam Blood pressure 127/78, pulse (!) 114, temperature 97.8 F (36.6 C), temperature source Oral, height 5\' 5"  (1.651 m), weight 80.3 kg (177 lb), SpO2 95 %. CONSTITUTIONAL: Debilitated chronically ill elderly male wearing oxygen EYES: Pupils are equal, round, and reactive to light, Sclera are non-icteric. EARS, NOSE, MOUTH AND THROAT: The oropharynx is clear. The oral mucosa is pink and moist. Hearing is intact to voice. LYMPH NODES:  Lymph nodes in the neck are normal. RESPIRATORY:   Lungs are clear. There is normal respiratory effort, with distant sounds bilaterally, and without pathologic use of accessory muscles. CARDIOVASCULAR: Heart is regular without murmurs, gallops, or rubs. GI: The abdomen is  soft, nontender, and nondistended. There are no palpable masses. There is no hepatosplenomegaly. There are normal bowel sounds in all quadrants. GU: Rectal deferred.   MUSCULOSKELETAL: Normal muscle strength and tone. No cyanosis or edema.   SKIN: Turgor is good and there are no pathologic skin lesions or ulcers. NEUROLOGIC: Motor and sensation is grossly normal. Cranial nerves are grossly intact. PSYCH:  Oriented to person, place and time. Affect is normal.  Data Reviewed  I have personally reviewed the patient's imaging, laboratory findings and medical records.    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 She has significant comorbidities including severe COPD on home oxygen is 2 L and with significant dyspnea on exertion. Had a lengthy discussion with the patient about his situation and although technically he does have an indication for sigmoid colectomy his performance status as well as his multiple comorbidities will make this operation difficult, my perioperative care perspective. After explaining to him the potential risk and benefit of the operation and the natural history of this disease as well as what a sigmoid colectomy will entail he stated that he is not interested in any form or surgical therapy. If he changes his mind my procedures of choice will be an open Hartman's procedure given that he will minimize operative time and minimize the potential for lethal complications such as anastomotic leak.  At this time he is adamant about not wanting any surgical therapy unless so any potential colostomy. He asked me to treat him medically with some antibiotics and I will prescribe a short course.  If  he changes his mind I will be happy to see him again and as I stated above if I decide that surgical therapy is indicated I will perform a Hartman's Procedure ( Sigmoid colectomy w end colostomy). A copy of  this report will be sent to Dr. Hoover Brunette, MD FACS General Surgeon 01/03/2018, 3:22 PM

## 2018-01-13 DIAGNOSIS — J449 Chronic obstructive pulmonary disease, unspecified: Secondary | ICD-10-CM | POA: Diagnosis not present

## 2018-01-14 DIAGNOSIS — J449 Chronic obstructive pulmonary disease, unspecified: Secondary | ICD-10-CM | POA: Diagnosis not present

## 2018-01-26 ENCOUNTER — Other Ambulatory Visit: Payer: Self-pay | Admitting: Family Medicine

## 2018-01-26 ENCOUNTER — Telehealth: Payer: Self-pay

## 2018-01-26 MED ORDER — PREDNISONE 20 MG PO TABS: ORAL_TABLET | ORAL | 0 refills | Status: DC

## 2018-01-26 NOTE — Telephone Encounter (Signed)
Would you like patient to come in office for evaluation? KW

## 2018-01-26 NOTE — Telephone Encounter (Signed)
Patient advised.KW 

## 2018-01-26 NOTE — Telephone Encounter (Signed)
Patient's wife called requesting that we send in a prednisone prescription for the patient. She reports that he is having another gout flare. He uses [facility] on [address]. Contact info is correct. Thanks!

## 2018-01-26 NOTE — Telephone Encounter (Signed)
Let them know that I sent in the prednisone. Remind them that it will make his sugar rise so keep up with extra water intake to avoid dehydration

## 2018-02-12 DIAGNOSIS — J449 Chronic obstructive pulmonary disease, unspecified: Secondary | ICD-10-CM | POA: Diagnosis not present

## 2018-02-13 DIAGNOSIS — J449 Chronic obstructive pulmonary disease, unspecified: Secondary | ICD-10-CM | POA: Diagnosis not present

## 2018-02-14 ENCOUNTER — Other Ambulatory Visit: Payer: Self-pay

## 2018-02-14 ENCOUNTER — Inpatient Hospital Stay
Admission: EM | Admit: 2018-02-14 | Discharge: 2018-02-16 | DRG: 871 | Disposition: A | Discharge status: Home / Self Care | Payer: Medicare HMO | Attending: Internal Medicine | Admitting: Internal Medicine

## 2018-02-14 ENCOUNTER — Encounter: Payer: Self-pay | Admitting: *Deleted

## 2018-02-14 ENCOUNTER — Emergency Department: Payer: Medicare HMO

## 2018-02-14 DIAGNOSIS — Z905 Acquired absence of kidney: Secondary | ICD-10-CM

## 2018-02-14 DIAGNOSIS — J440 Chronic obstructive pulmonary disease with acute lower respiratory infection: Secondary | ICD-10-CM | POA: Diagnosis present

## 2018-02-14 DIAGNOSIS — Z515 Encounter for palliative care: Secondary | ICD-10-CM | POA: Diagnosis not present

## 2018-02-14 DIAGNOSIS — J9600 Acute respiratory failure, unspecified whether with hypoxia or hypercapnia: Secondary | ICD-10-CM | POA: Diagnosis not present

## 2018-02-14 DIAGNOSIS — J189 Pneumonia, unspecified organism: Secondary | ICD-10-CM

## 2018-02-14 DIAGNOSIS — F17220 Nicotine dependence, chewing tobacco, uncomplicated: Secondary | ICD-10-CM | POA: Diagnosis present

## 2018-02-14 DIAGNOSIS — N179 Acute kidney failure, unspecified: Secondary | ICD-10-CM | POA: Diagnosis present

## 2018-02-14 DIAGNOSIS — J181 Lobar pneumonia, unspecified organism: Secondary | ICD-10-CM | POA: Diagnosis not present

## 2018-02-14 DIAGNOSIS — A419 Sepsis, unspecified organism: Secondary | ICD-10-CM | POA: Diagnosis not present

## 2018-02-14 DIAGNOSIS — I2510 Atherosclerotic heart disease of native coronary artery without angina pectoris: Secondary | ICD-10-CM | POA: Diagnosis present

## 2018-02-14 DIAGNOSIS — K219 Gastro-esophageal reflux disease without esophagitis: Secondary | ICD-10-CM | POA: Diagnosis present

## 2018-02-14 DIAGNOSIS — J9601 Acute respiratory failure with hypoxia: Secondary | ICD-10-CM | POA: Diagnosis not present

## 2018-02-14 DIAGNOSIS — Z7189 Other specified counseling: Secondary | ICD-10-CM | POA: Diagnosis not present

## 2018-02-14 DIAGNOSIS — J441 Chronic obstructive pulmonary disease with (acute) exacerbation: Secondary | ICD-10-CM | POA: Diagnosis not present

## 2018-02-14 DIAGNOSIS — Z8249 Family history of ischemic heart disease and other diseases of the circulatory system: Secondary | ICD-10-CM | POA: Diagnosis not present

## 2018-02-14 DIAGNOSIS — I5032 Chronic diastolic (congestive) heart failure: Secondary | ICD-10-CM | POA: Diagnosis present

## 2018-02-14 DIAGNOSIS — E785 Hyperlipidemia, unspecified: Secondary | ICD-10-CM | POA: Diagnosis present

## 2018-02-14 DIAGNOSIS — E1122 Type 2 diabetes mellitus with diabetic chronic kidney disease: Secondary | ICD-10-CM | POA: Diagnosis present

## 2018-02-14 DIAGNOSIS — I130 Hypertensive heart and chronic kidney disease with heart failure and stage 1 through stage 4 chronic kidney disease, or unspecified chronic kidney disease: Secondary | ICD-10-CM | POA: Diagnosis not present

## 2018-02-14 DIAGNOSIS — R0602 Shortness of breath: Secondary | ICD-10-CM | POA: Diagnosis not present

## 2018-02-14 DIAGNOSIS — J9621 Acute and chronic respiratory failure with hypoxia: Secondary | ICD-10-CM | POA: Diagnosis present

## 2018-02-14 DIAGNOSIS — Z7982 Long term (current) use of aspirin: Secondary | ICD-10-CM

## 2018-02-14 DIAGNOSIS — Z85528 Personal history of other malignant neoplasm of kidney: Secondary | ICD-10-CM | POA: Diagnosis not present

## 2018-02-14 DIAGNOSIS — Z9981 Dependence on supplemental oxygen: Secondary | ICD-10-CM

## 2018-02-14 DIAGNOSIS — N183 Chronic kidney disease, stage 3 (moderate): Secondary | ICD-10-CM | POA: Diagnosis not present

## 2018-02-14 DIAGNOSIS — R531 Weakness: Secondary | ICD-10-CM | POA: Diagnosis not present

## 2018-02-14 LAB — CBC WITH DIFFERENTIAL/PLATELET
Basophils Absolute: 0.1 10*3/uL (ref 0–0.1)
Basophils Relative: 1 %
Eosinophils Absolute: 0.1 10*3/uL (ref 0–0.7)
Eosinophils Relative: 1 %
HEMATOCRIT: 42 % (ref 40.0–52.0)
HEMOGLOBIN: 14.1 g/dL (ref 13.0–18.0)
LYMPHS ABS: 1.4 10*3/uL (ref 1.0–3.6)
Lymphocytes Relative: 10 %
MCH: 28.5 pg (ref 26.0–34.0)
MCHC: 33.6 g/dL (ref 32.0–36.0)
MCV: 84.8 fL (ref 80.0–100.0)
MONO ABS: 1.2 10*3/uL — AB (ref 0.2–1.0)
MONOS PCT: 8 %
NEUTROS ABS: 11.1 10*3/uL — AB (ref 1.4–6.5)
Neutrophils Relative %: 80 %
Platelets: 243 10*3/uL (ref 150–440)
RBC: 4.95 MIL/uL (ref 4.40–5.90)
RDW: 15.4 % — AB (ref 11.5–14.5)
WBC: 13.9 10*3/uL — ABNORMAL HIGH (ref 3.8–10.6)

## 2018-02-14 LAB — COMPREHENSIVE METABOLIC PANEL
ALBUMIN: 3.3 g/dL — AB (ref 3.5–5.0)
ALT: 17 U/L (ref 0–44)
ANION GAP: 10 (ref 5–15)
AST: 17 U/L (ref 15–41)
Alkaline Phosphatase: 80 U/L (ref 38–126)
BILIRUBIN TOTAL: 1.1 mg/dL (ref 0.3–1.2)
BUN: 16 mg/dL (ref 8–23)
CHLORIDE: 96 mmol/L — AB (ref 98–111)
CO2: 29 mmol/L (ref 22–32)
Calcium: 8.6 mg/dL — ABNORMAL LOW (ref 8.9–10.3)
Creatinine, Ser: 2.34 mg/dL — ABNORMAL HIGH (ref 0.61–1.24)
GFR calc Af Amer: 29 mL/min — ABNORMAL LOW (ref >60)
GFR calc non Af Amer: 25 mL/min — ABNORMAL LOW (ref >60)
GLUCOSE: 127 mg/dL — AB (ref 70–99)
POTASSIUM: 4.6 mmol/L (ref 3.5–5.1)
SODIUM: 135 mmol/L (ref 135–145)
TOTAL PROTEIN: 7.2 g/dL (ref 6.5–8.1)

## 2018-02-14 LAB — LACTIC ACID, PLASMA: Lactic Acid, Venous: 1.1 mmol/L (ref 0.5–1.9)

## 2018-02-14 LAB — TROPONIN I: Troponin I: <0.03 ng/mL (ref <0.03)

## 2018-02-14 MED ORDER — SODIUM CHLORIDE 0.9 % IV BOLUS (SEPSIS)
1000.0000 mL | Freq: Once | INTRAVENOUS | Status: AC
  Administered 2018-02-14: 1000 mL via INTRAVENOUS

## 2018-02-14 MED ORDER — VANCOMYCIN HCL IN DEXTROSE 1-5 GM/200ML-% IV SOLN
1000.0000 mg | Freq: Once | INTRAVENOUS | Status: AC
  Administered 2018-02-14: 1000 mg via INTRAVENOUS
  Filled 2018-02-14: qty 200

## 2018-02-14 MED ORDER — IPRATROPIUM-ALBUTEROL 0.5-2.5 (3) MG/3ML IN SOLN
RESPIRATORY_TRACT | Status: AC
Start: 2018-02-14 — End: 2018-02-15
  Filled 2018-02-14: qty 6

## 2018-02-14 MED ORDER — IPRATROPIUM-ALBUTEROL 0.5-2.5 (3) MG/3ML IN SOLN
RESPIRATORY_TRACT | Status: AC
Start: 2018-02-14 — End: 2018-02-15
  Filled 2018-02-14: qty 9

## 2018-02-14 MED ORDER — IPRATROPIUM-ALBUTEROL 0.5-2.5 (3) MG/3ML IN SOLN
9.0000 mL | Freq: Once | RESPIRATORY_TRACT | Status: AC
  Administered 2018-02-14: 9 mL via RESPIRATORY_TRACT
  Filled 2018-02-14: qty 3

## 2018-02-14 MED ORDER — SODIUM CHLORIDE 0.9 % IV SOLN
2.0000 g | INTRAVENOUS | Status: DC
  Filled 2018-02-14: qty 2

## 2018-02-14 MED ORDER — PREDNISONE 20 MG PO TABS
60.0000 mg | ORAL_TABLET | Freq: Once | ORAL | Status: AC
  Administered 2018-02-14: 60 mg via ORAL
  Filled 2018-02-14: qty 3

## 2018-02-14 MED ORDER — SODIUM CHLORIDE 0.9 % IV SOLN
2.0000 g | Freq: Once | INTRAVENOUS | Status: AC
  Administered 2018-02-14: 2 g via INTRAVENOUS
  Filled 2018-02-14: qty 2

## 2018-02-14 MED ORDER — SODIUM CHLORIDE 0.9 % IV BOLUS (SEPSIS)
500.0000 mL | Freq: Once | INTRAVENOUS | Status: AC
  Administered 2018-02-14: 500 mL via INTRAVENOUS

## 2018-02-14 MED ORDER — VANCOMYCIN HCL IN DEXTROSE 750-5 MG/150ML-% IV SOLN
750.0000 mg | INTRAVENOUS | Status: DC
  Filled 2018-02-14: qty 150

## 2018-02-14 NOTE — ED Notes (Signed)
Family at bedside. 

## 2018-02-14 NOTE — ED Provider Notes (Signed)
[facility] Emergency Department Provider Note ____________________________________________   First MD Initiated Contact with Patient 02/14/18 2111     (approximate)  I have reviewed the triage vital signs and the nursing notes.   HISTORY  Chief Complaint Shortness of Breath and Hypotension  HPI Bobby Peterson is a 77 y.o. male with a history of acute respiratory failure secondary to COPD with hypoxia on 2 L of nasal cannula oxygen at home was presenting with 24 hours of shortness of breath with cough productive of white sputum.  He is denying any pain at this time.  Says that he has been admitted to the ICU before and has had delirium because of IV steroids.  He is requesting p.o. Prednisone.  Past Medical History:  Diagnosis Date  . Acute respiratory failure with hypoxia ([place]) 12/03/2013   Overview:  Overview:  2 L O2 continuously  Last Assessment & Plan:  Continue 2 L O2 continuously   . Atrial tachycardia ([place]) 04/21/2016  . 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 BP (high blood pressure) 07/08/2015  . Chronic diastolic CHF (congestive heart failure) ([place])   . Chronic diastolic heart failure ([place]) 07/08/2015  . Chronic hypoxemic respiratory failure ([place]) 12/03/2013   2 L O2 continuously   . Chronic respiratory failure (HCC)    a. on home O2  . COPD (chronic obstructive pulmonary disease) ([place])   . COPD (chronic obstructive pulmonary disease) ([place]) 12/03/2013   May fifth 2015 simple spirometry>> ratio 45%, FEV1 0.95 L (34% per day)   . Coronary artery disease   . Coronary atherosclerosis 07/08/2015   Dr. Ubaldo Glassing   . Esophageal reflux 07/08/2015  . Hernia, umbilical   . Hypercholesteremia   . Hypertension   . Obesity   . Renal cancer ([place])    a. s/p nephrectomy in 1980's  . Smoking 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 pack years 05/10/2016    Patient Active Problem List   Diagnosis Date Noted  . 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 Smoking 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 pack years 05/10/2016    . Atrial tachycardia ([place]) 04/21/2016  . Allergic rhinitis 07/08/2015  . 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 Coronary atherosclerosis 07/08/2015  . Chronic diastolic heart failure ([place]) 07/08/2015  . Chronic kidney disease (CKD), stage III (moderate) ([place]) 07/08/2015  . Elevated blood sugar 07/08/2015  . Esophageal reflux 07/08/2015  . HLD (hyperlipidemia) 07/08/2015  . BP (high blood pressure) 07/08/2015  . Calculus of kidney 07/08/2015  . COPD (chronic obstructive pulmonary disease) ([place]) 12/03/2013  . Chronic hypoxemic respiratory failure ([place]) 12/03/2013    Past Surgical History:  Procedure Laterality Date  . CHOLECYSTECTOMY N/A 12/22/2014   Procedure: LAPAROSCOPIC CHOLECYSTECTOMY WITH INTRAOPERATIVE CHOLANGIOGRAM;  Surgeon: Christene Lye, MD;  Location: [facility] ORS;  Service: General;  Laterality: N/A;  . COLONOSCOPY    . ERCP N/A 01/05/2015   Procedure: ENDOSCOPIC RETROGRADE CHOLANGIOPANCREATOGRAPHY (ERCP);  Surgeon: Hulen Luster, MD;  Location: [facility] ENDOSCOPY;  Service: Endoscopy;  Laterality: N/A;  . ERCP N/A 02/19/2015   Procedure: ENDOSCOPIC RETROGRADE CHOLANGIOPANCREATOGRAPHY (ERCP);  Surgeon: Hulen Luster, MD;  Location: [facility] ENDOSCOPY;  Service: Gastroenterology;  Laterality: N/A;  . GALLBLADDER SURGERY  11/2014  . NEPHRECTOMY  1990  . OMENTECTOMY  12/22/2014   Procedure: OMENTECTOMY;  Surgeon: Christene Lye, MD;  Location: [facility] ORS;  Service: General;;  Partial omentectomy  . STENT REMOVAL    . UMBILICAL HERNIA REPAIR N/A 12/22/2014   Procedure: HERNIA  REPAIR UMBILICAL ADULT;  Surgeon: Christene Lye, MD;  Location: [facility] ORS;  Service: General;  Laterality: N/A;    Prior to Admission medications   Medication Sig Start Date End Date Taking? Authorizing Provider  albuterol (PROVENTIL HFA;VENTOLIN HFA) 108 (90 Base) MCG/ACT inhaler Inhale 2 puffs into the lungs every 6 (six) hours as needed for wheezing or shortness of  breath. 08/02/17  Yes Carmon Ginsberg, PA  aspirin 81 MG tablet Take 81 mg by mouth daily.   Yes [provider]  Fluticasone-Salmeterol (ADVAIR) 250-50 MCG/DOSE AEPB INHALE 1 PUFF TWICE DAILY 01/02/18  Yes Carmon Ginsberg, PA  furosemide (LASIX) 40 MG tablet TAKE 1 TABLET EVERY DAY 10/05/17  Yes Carmon Ginsberg, PA  gabapentin (NEURONTIN) 300 MG capsule TAKE 1 CAPSULE TWICE DAILY 05/12/17  Yes Carmon Ginsberg, PA  metoprolol succinate (TOPROL-XL) 100 MG 24 hr tablet TAKE 1 TABLET DAILY WITH OR IMMEDIATELY FOLLOWING A MEAL. 10/05/17  Yes Carmon Ginsberg, PA  omeprazole (PRILOSEC) 40 MG capsule TAKE 1 CAPSULE DAILY BEFORE BREAKFAST. 05/12/17  Yes Chauvin, Herbie [place], PA  pravastatin (PRAVACHOL) 20 MG tablet TAKE 1 TABLET EVERY DAY 05/12/17  Yes Carmon Ginsberg, PA  tamsulosin (FLOMAX) 0.4 MG CAPS capsule TAKE 1 CAPSULE EVERY DAY 05/12/17  Yes Carmon Ginsberg, PA  vitamin B-12 (CYANOCOBALAMIN) 1000 MCG tablet Take 1,000 mcg by mouth once a week. *Take on Monday*   Yes [provider]  ciprofloxacin (CIPRO) 500 MG tablet Take 1 tablet (500 mg total) by mouth 2 (two) times daily. Patient not taking: Reported on 02/14/2018 01/03/18   Jules Husbands, MD  metroNIDAZOLE (FLAGYL) 500 MG tablet Take 1 tablet (500 mg total) by mouth 3 (three) times daily. Patient not taking: Reported on 02/14/2018 01/03/18   Jules Husbands, MD  nystatin-triamcinolone ointment [facility]) Apply 1 application topically 2 (two) times daily. Patient not taking: Reported on 02/14/2018 09/26/17   Hollice Espy, MD  OXYGEN Inhale 2 L/min into the lungs continuous.     [provider]  predniSONE (DELTASONE) 20 MG tablet One pill twice daily for 5 days then one pill daily for 5 days Patient not taking: Reported on 02/14/2018 01/26/18   Carmon Ginsberg, PA    Allergies Fenofibrate; Lisinopril; Niacin; Phenergan [promethazine hcl]; and Simvastatin  Family History  Problem Relation Age of Onset  . Hypertension Mother      Social History Social History   Tobacco Use  . Smoking status: Former Smoker    Packs/day: 1.00    Years: 50.00    Pack years: 50.00    Types: Cigarettes    Last attempt to quit: 12/04/2003    Years since quitting: 14.2  . Smokeless tobacco: Current User    Types: Chew  . Tobacco comment: Has been chewing tobacco since quitting smoking in 2005.  Substance Use Topics  . Alcohol use: No  . Drug use: No    Review of Systems  Constitutional: Positive for fever Eyes: No visual changes. ENT: No sore throat. Cardiovascular: Denies chest pain. Respiratory: As above Gastrointestinal: No abdominal pain.  No nausea, no vomiting.  No diarrhea.  No constipation. Genitourinary: Negative for dysuria. Musculoskeletal: Negative for back pain. Skin: Negative for rash. Neurological: Negative for headaches, focal weakness or numbness.   ____________________________________________   PHYSICAL EXAM:  VITAL SIGNS: ED Triage Vitals  Enc Vitals Group     BP 02/14/18 2104 (!) 89/53     Pulse Rate 02/14/18 2104 (!) 120     Resp 02/14/18 2104 17  Temp 02/14/18 2104 100.3 F (37.9 C)     Temp Source 02/14/18 2104 Oral     SpO2 02/14/18 2104 92 %     Weight 02/14/18 2106 170 lb (77.1 kg)     Height 02/14/18 2106 5\' 5"  (1.651 m)     Head Circumference --      Peak Flow --      Pain Score 02/14/18 2118 0     Pain Loc --      Pain Edu? --      Excl. in [place]? --     Constitutional: Alert and oriented. Eyes: Conjunctivae are normal.  Head: Atraumatic. Nose: No congestion/rhinnorhea. Mouth/Throat: Mucous membranes are moist.  Neck: No stridor.   Cardiovascular: Normal rate, regular rhythm. Grossly normal heart sounds.   Respiratory: Patient able to speak in full sentences but with belly breathing.  Tachypneic with decreased air movement throughout with coarse expiratory wheezing throughout all fields. Gastrointestinal: Soft and nontender. No distention.  Musculoskeletal: No lower  extremity tenderness nor edema.  No joint effusions. Neurologic:  Normal speech and language. No gross focal neurologic deficits are appreciated. Skin:  Skin is warm, dry and intact. No rash noted. Psychiatric: Mood and affect are normal. Speech and behavior are normal.  ____________________________________________   LABS (all labs ordered are listed, but only abnormal results are displayed)  Labs Reviewed  CBC WITH DIFFERENTIAL/PLATELET - Abnormal; Notable for the following components:      Result Value   WBC 13.9 (*)    RDW 15.4 (*)    Neutro Abs 11.1 (*)    Monocytes Absolute 1.2 (*)    All other components within normal limits  CULTURE, BLOOD (ROUTINE X 2)  CULTURE, BLOOD (ROUTINE X 2)  URINE CULTURE  COMPREHENSIVE METABOLIC PANEL  URINALYSIS, ROUTINE W REFLEX MICROSCOPIC  LACTIC ACID, PLASMA  LACTIC ACID, PLASMA  TROPONIN I   ____________________________________________  EKG  ED ECG REPORT I, Doran Stabler, the attending physician, personally viewed and interpreted this ECG.   Date: 02/14/2018  EKG Time: 2115  Rate: 127  Rhythm: sinus tachycardia  Axis: Normal  Intervals:none  ST&T Change: No ST segment elevation.  Diffuse T wave depression.  Likely rate related.  No abnormal T wave inversion.  ____________________________________________  RADIOLOGY  Left lower lobe infiltrate ____________________________________________   PROCEDURES  Procedure(s) performed:   .Critical Care Performed by: Orbie Pyo, MD Authorized by: Orbie Pyo, MD   Critical care provider statement:    Critical care time (minutes):  35   Critical care time was exclusive of:  Separately billable procedures and treating other patients   Critical care was necessary to treat or prevent imminent or life-threatening deterioration of the following conditions:  Respiratory failure   Critical care was time spent personally by me on the following activities:   Development of treatment plan with patient or surrogate, discussions with consultants, evaluation of patient's response to treatment, examination of patient, obtaining history from patient or surrogate, ordering and performing treatments and interventions, ordering and review of laboratory studies, ordering and review of radiographic studies, pulse oximetry, re-evaluation of patient's condition and review of old charts    Critical Care performed:   ____________________________________________   INITIAL IMPRESSION / ASSESSMENT AND PLAN / ED COURSE  Pertinent labs & imaging results that were available during my care of the patient were reviewed by me and considered in my medical decision making (see chart for details).  Differential includes, but is not limited to,  viral syndrome, bronchitis including COPD exacerbation, pneumonia, reactive airway disease including asthma, CHF including exacerbation with or without pulmonary/interstitial edema, pneumothorax, ACS, thoracic trauma, and pulmonary embolism. As part of my medical decision making, I reviewed the following data within the electronic MEDICAL RECORD NUMBER Notes from prior ED visits  ----------------------------------------- 10:29 PM on 02/14/2018 -----------------------------------------  Patient placed on BiPAP and is tolerating it well.  Will be admitted to the ICU.  Signed out to Dr. Duane Boston.  Sepsis protocol initiated.  Antibiotics ordered.  Patient and family aware of diagnosis and treatment plan and willing to comply. ____________________________________________   FINAL CLINICAL IMPRESSION(S) / ED DIAGNOSES  Final diagnoses:  COPD exacerbation ([place])  HCAP (healthcare-associated pneumonia)  Sepsis, due to unspecified organism [facility])      NEW MEDICATIONS STARTED DURING THIS VISIT:  New Prescriptions   No medications on file     Note:  This document was prepared using Dragon voice recognition software and may include  unintentional dictation errors.     Orbie Pyo, MD 02/14/18 2230

## 2018-02-14 NOTE — Progress Notes (Signed)
CODE SEPSIS - PHARMACY COMMUNICATION  **Broad Spectrum Antibiotics should be administered within 1 hour of Sepsis diagnosis**  Time Code Sepsis Called/Page Received: 2112  Antibiotics Ordered: vanc/cefepime  Time of 1st antibiotic administration: 2151  Additional action taken by pharmacy:   If necessary, Name of Provider/Nurse Contacted:     Tobie Lords ,PharmD Clinical Pharmacist  02/14/2018  10:21 PM

## 2018-02-14 NOTE — H&P (Signed)
[facility] [place] NAME: Bobby Peterson    MR#:  [id number]  DATE OF BIRTH:  [birth year]  DATE OF ADMISSION:  02/14/2018  PRIMARY CARE PHYSICIAN: Carmon Ginsberg, PA   REQUESTING/REFERRING PHYSICIAN:   CHIEF COMPLAINT:   Chief Complaint  Patient presents with  . Shortness of Breath  . Hypotension    HISTORY OF PRESENT ILLNESS: Bobby Peterson  is a 77 y.o. male with a known history of chronic respiratory failure from COPD, on continuous 2 L oxygen at home. Patient presented to emergency room for worsening shortness of breath and productive cough with yellow sputum going on for the past 24 hours, gradually worsening.  Patient measured his oxygen saturation at home and it was 88% on 2 L nasal cannula.  He also reports subjective fevers, but he did not check his temperature at home. At the arrival to emergency room patient was noted with low BP 89/53, low-grade fever at 100.3, tachycardia with heart rates at 120 and tachypnea.  His symptoms improved on BiPAP.  Blood tests are remarkable for elevated WBC at 13.9.  Creatinine is elevated at 2.3.  Lactic acid is 1.1. Chest x-ray shows left lung base infiltrate.  EKG shows sinus tachycardia, no acute ischemic changes. Patient is admitted for further evaluation and treatment.   PAST MEDICAL HISTORY:   Past Medical History:  Diagnosis Date  . Acute respiratory failure with hypoxia ([place]) 12/03/2013   Overview:  Overview:  2 L O2 continuously  Last Assessment & Plan:  Continue 2 L O2 continuously   . Atrial tachycardia ([place]) 04/21/2016  . 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 BP (high blood pressure) 07/08/2015  . Chronic diastolic CHF (congestive heart failure) ([place])   . Chronic diastolic heart failure ([place]) 07/08/2015  . Chronic hypoxemic respiratory failure ([place]) 12/03/2013   2 L O2 continuously   . Chronic respiratory failure ([facility])    a. on home O2  . COPD (chronic obstructive pulmonary  disease) ([place])   . COPD (chronic obstructive pulmonary disease) ([place]) 12/03/2013   May fifth 2015 simple spirometry>> ratio 45%, FEV1 0.95 L (34% per day)   . Coronary artery disease   . Coronary atherosclerosis 07/08/2015   Dr. Ubaldo Glassing   . Esophageal reflux 07/08/2015  . Hernia, umbilical   . Hypercholesteremia   . Hypertension   . Obesity   . Renal cancer ([place])    a. s/p nephrectomy in 1980's  . Smoking 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 pack years 05/10/2016    PAST SURGICAL HISTORY:  Past Surgical History:  Procedure Laterality Date  . CHOLECYSTECTOMY N/A 12/22/2014   Procedure: LAPAROSCOPIC CHOLECYSTECTOMY WITH INTRAOPERATIVE CHOLANGIOGRAM;  Surgeon: Christene Lye, MD;  Location: [facility] ORS;  Service: General;  Laterality: N/A;  . COLONOSCOPY    . ERCP N/A 01/05/2015   Procedure: ENDOSCOPIC RETROGRADE CHOLANGIOPANCREATOGRAPHY (ERCP);  Surgeon: Hulen Luster, MD;  Location: [facility] ENDOSCOPY;  Service: Endoscopy;  Laterality: N/A;  . ERCP N/A 02/19/2015   Procedure: ENDOSCOPIC RETROGRADE CHOLANGIOPANCREATOGRAPHY (ERCP);  Surgeon: Hulen Luster, MD;  Location: [facility] ENDOSCOPY;  Service: Gastroenterology;  Laterality: N/A;  . GALLBLADDER SURGERY  11/2014  . NEPHRECTOMY  1990  . OMENTECTOMY  12/22/2014   Procedure: OMENTECTOMY;  Surgeon: Christene Lye, MD;  Location: [facility] ORS;  Service: General;;  Partial omentectomy  . STENT REMOVAL    . UMBILICAL HERNIA REPAIR N/A 12/22/2014   Procedure: HERNIA REPAIR UMBILICAL ADULT;  Surgeon: Christene Lye, MD;  Location: [facility] ORS;  Service: General;  Laterality: N/A;    SOCIAL HISTORY:  Social History   Tobacco Use  . Smoking status: Former Smoker    Packs/day: 1.00    Years: 50.00    Pack years: 50.00    Types: Cigarettes    Last attempt to quit: 12/04/2003    Years since quitting: 14.2  . Smokeless tobacco: Current User    Types: Chew  . Tobacco comment: Has been chewing tobacco since quitting smoking in 2005.  Substance Use Topics  . Alcohol use: No     FAMILY HISTORY:  Family History  Problem Relation Age of Onset  . Hypertension Mother     DRUG ALLERGIES:  Allergies  Allergen Reactions  . Fenofibrate     Other reaction(s): Headache  . Lisinopril Cough  . Niacin     Other reaction(s): Dizziness  . Phenergan [Promethazine Hcl]     NVD  . Simvastatin     Other reaction(s): Headache    REVIEW OF SYSTEMS:   CONSTITUTIONAL: Positive for low-grade fever, fatigue and generalized weakness.  EYES: No blurred or double vision.  EARS, NOSE, AND THROAT: No tinnitus or ear pain.  RESPIRATORY: Positive for cough, shortness of breath, wheezing, no hemoptysis.  CARDIOVASCULAR: No chest pain, orthopnea, edema.  GASTROINTESTINAL: No nausea, vomiting, diarrhea or abdominal pain.  GENITOURINARY: No dysuria, hematuria.  ENDOCRINE: No polyuria, nocturia,  HEMATOLOGY: No anemia, easy bruising or bleeding SKIN: No rash or lesion. MUSCULOSKELETAL: No joint pain.   NEUROLOGIC: No focal weakness.  PSYCHIATRY: No anxiety or depression.   MEDICATIONS AT HOME:  Prior to Admission medications   Medication Sig Start Date End Date Taking? Authorizing Provider  albuterol (PROVENTIL HFA;VENTOLIN HFA) 108 (90 Base) MCG/ACT inhaler Inhale 2 puffs into the lungs every 6 (six) hours as needed for wheezing or shortness of breath. 08/02/17  Yes Carmon Ginsberg, PA  aspirin 81 MG tablet Take 81 mg by mouth daily.   Yes [provider]  Fluticasone-Salmeterol (ADVAIR) 250-50 MCG/DOSE AEPB INHALE 1 PUFF TWICE DAILY 01/02/18  Yes Carmon Ginsberg, PA  furosemide (LASIX) 40 MG tablet TAKE 1 TABLET EVERY DAY 10/05/17  Yes Carmon Ginsberg, PA  gabapentin (NEURONTIN) 300 MG capsule TAKE 1 CAPSULE TWICE DAILY 05/12/17  Yes Carmon Ginsberg, PA  metoprolol succinate (TOPROL-XL) 100 MG 24 hr tablet TAKE 1 TABLET DAILY WITH OR IMMEDIATELY FOLLOWING A MEAL. 10/05/17  Yes Carmon Ginsberg, PA  omeprazole (PRILOSEC) 40 MG capsule TAKE 1 CAPSULE DAILY BEFORE BREAKFAST.  05/12/17  Yes Chauvin, Herbie [place], PA  pravastatin (PRAVACHOL) 20 MG tablet TAKE 1 TABLET EVERY DAY 05/12/17  Yes Carmon Ginsberg, PA  tamsulosin (FLOMAX) 0.4 MG CAPS capsule TAKE 1 CAPSULE EVERY DAY 05/12/17  Yes Carmon Ginsberg, PA  vitamin B-12 (CYANOCOBALAMIN) 1000 MCG tablet Take 1,000 mcg by mouth once a week. *Take on Monday*   Yes [provider]  ciprofloxacin (CIPRO) 500 MG tablet Take 1 tablet (500 mg total) by mouth 2 (two) times daily. Patient not taking: Reported on 02/14/2018 01/03/18   Jules Husbands, MD  metroNIDAZOLE (FLAGYL) 500 MG tablet Take 1 tablet (500 mg total) by mouth 3 (three) times daily. Patient not taking: Reported on 02/14/2018 01/03/18   Jules Husbands, MD  nystatin-triamcinolone ointment [facility]) Apply 1 application topically 2 (two) times daily. Patient not taking: Reported on 02/14/2018 09/26/17   Hollice Espy, MD  OXYGEN Inhale 2 L/min into the lungs continuous.     [provider]  predniSONE (  DELTASONE) 20 MG tablet One pill twice daily for 5 days then one pill daily for 5 days Patient not taking: Reported on 02/14/2018 01/26/18   Carmon Ginsberg, PA      PHYSICAL EXAMINATION:   VITAL SIGNS: Blood pressure 112/61, pulse (!) 118, temperature 100.3 F (37.9 C), temperature source Oral, resp. rate (!) 24, height 5\' 5"  (1.651 m), weight 77.1 kg (170 lb), SpO2 95 %.  GENERAL:  77 y.o.-year-old patient lying in the bed with moderate respiratory distress, on BiPAP.  EYES: Pupils equal, round, reactive to light and accommodation. No scleral icterus. Extraocular muscles intact.  HEENT: Head atraumatic, normocephalic. Oropharynx and nasopharynx clear.  NECK:  Supple, no jugular venous distention. No thyroid enlargement, no tenderness.  LUNGS: Reduced breath sounds and expiratory wheezing noted bilaterally.  No use of accessory muscles of respiration.  CARDIOVASCULAR: S1, S2 normal. No murmurs, rubs, or gallops.  ABDOMEN: Soft, nontender, nondistended.  Bowel sounds present. No organomegaly or mass.  EXTREMITIES: No pedal edema, cyanosis, or clubbing.  NEUROLOGIC: No focal weakness. PSYCHIATRIC: The patient is alert and oriented x 3.  SKIN: No obvious rash, lesion, or ulcer.   LABORATORY PANEL:   CBC Recent Labs  Lab 02/14/18 2130  WBC 13.9*  HGB 14.1  HCT 42.0  PLT 243  MCV 84.8  MCH 28.5  MCHC 33.6  RDW 15.4*  LYMPHSABS 1.4  MONOABS 1.2*  EOSABS 0.1  BASOSABS 0.1   ------------------------------------------------------------------------------------------------------------------  Chemistries  Recent Labs  Lab 02/14/18 2130  NA 135  K 4.6  CL 96*  CO2 29  GLUCOSE 127*  BUN 16  CREATININE 2.34*  CALCIUM 8.6*  AST 17  ALT 17  ALKPHOS 80  BILITOT 1.1   ------------------------------------------------------------------------------------------------------------------ estimated creatinine clearance is 24.5 mL/min (A) (by C-G formula based on SCr of 2.34 mg/dL (H)). ------------------------------------------------------------------------------------------------------------------ No results for input(s): TSH, T4TOTAL, T3FREE, THYROIDAB in the last 72 hours.  Invalid input(s): FREET3   Coagulation profile No results for input(s): INR, PROTIME in the last 168 hours. ------------------------------------------------------------------------------------------------------------------- No results for input(s): DDIMER in the last 72 hours. -------------------------------------------------------------------------------------------------------------------  Cardiac Enzymes Recent Labs  Lab 02/14/18 2130  TROPONINI <0.03   ------------------------------------------------------------------------------------------------------------------ Invalid input(s): POCBNP  ---------------------------------------------------------------------------------------------------------------  Urinalysis    Component Value Date/Time    COLORURINE YELLOW (A) 08/23/2017 2349   APPEARANCEUR Cloudy (A) 11/16/2017 1015   LABSPEC 1.017 08/23/2017 2349   PHURINE 5.0 08/23/2017 2349   GLUCOSEU Negative 11/16/2017 1015   HGBUR NEGATIVE 08/23/2017 2349   BILIRUBINUR Negative 11/16/2017 1015   KETONESUR NEGATIVE 08/23/2017 2349   PROTEINUR 2+ (A) 11/16/2017 1015   PROTEINUR NEGATIVE 08/23/2017 2349   UROBILINOGEN 0.2 09/26/2017 0814   NITRITE Negative 11/16/2017 1015   NITRITE NEGATIVE 08/23/2017 2349   LEUKOCYTESUR 3+ (A) 11/16/2017 1015     RADIOLOGY: Dg Chest Port 1 View  Result Date: 02/14/2018 CLINICAL DATA:  8 79-year-old male with shortness of breath. EXAM: PORTABLE CHEST 1 VIEW COMPARISON:  Chest radiograph dated 05/10/2016 FINDINGS: Left lung base densities may represent atelectasis/scarring or infiltrate. The right lung is clear. There is blunting of the left costophrenic angle which may represent scarring or small pleural effusion. Stable cardiac silhouette. No acute osseous pathology. IMPRESSION: Left lung base atelectasis/scarring versus infiltrate. Clinical correlation is recommended. Electronically Signed   By: Anner Crete M.D.   On: 02/14/2018 21:32    EKG: Orders placed or performed during the hospital encounter of 02/14/18  . ED EKG 12-Lead  . ED EKG 12-Lead  .  EKG 12-Lead  . EKG 12-Lead    IMPRESSION AND PLAN:  1.  Sepsis, likely secondary to pneumonia.  We will start IV fluids and broad-spectrum IV antibiotics. 2.  Acute respiratory failure on chronic respiratory failure, secondary to pneumonia and COPD exacerbation.  See treatment as above under #1.  Continue oxygen therapy and BiPAP support. 3.  CAP, will treat with antibiotics, oxygen therapy and BiPAP support. 4.  Acute COPD exacerbation.  Will treat with antibiotics, oxygen, BiPAP support, nebulizer treatments and steroids p.o.. Patient cannot tolerate steroids IV. 5. 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  We will treat with IV fluids.  Continue to  monitor kidney function closely and avoid nephrotoxic medications.   All the records are reviewed and case discussed with ED provider. Management plans discussed with the patient, family and they are in agreement.  CODE STATUS: Full Code Status History    Date Active Date Inactive Code Status Order ID Comments User Context   04/21/2016 0702 04/21/2016 1703 Full Code [id number]  Holley Raring, NP ED   01/04/2015 1401 01/07/2015 1737 Full Code [id number]  Greggory Keen, MD Inpatient   01/03/2015 1503 01/04/2015 1401 Full Code [id number]  Christene Lye, MD Inpatient       TOTAL TIME TAKING CARE OF THIS PATIENT: 50 minutes.    Amelia Jo M.D on 02/14/2018 at 11:39 PM  Between 7am to 6pm - Pager - [phone]  After 6pm go to [email or web address] - password [email or web address] [facility]  [facility]  Office  [phone]  CC: Primary care physician; Carmon Ginsberg, [place]

## 2018-02-14 NOTE — ED Triage Notes (Signed)
Pt to Ed reporting increased WOB and SOB for the past two days. Congested cough present and low grade fevers at home. Pt reports dizziness and weakness as well. Pt is tachycardic and dyspneic upon arrival. Pt also reports his oxygenation was 88% on chronic 2L this afternoon. Today in triage pt is 93% on 2L.

## 2018-02-14 NOTE — Progress Notes (Signed)
Pharmacy Antibiotic Note  Bobby Peterson is a 77 y.o. male admitted on 02/14/2018 with pneumonia.  Pharmacy has been consulted for vanc/cefepime dosing. Patient received vanc 1g and cefepime 2g IV x 1   Plan: Will continue w/ vanc 750 mg IV q24h w/ 8 hour stack  Will draw vanc trough 07/21 @ 0600 prior to 4th dose. Will continue cefepime 2g IV q24h per CrCl 11 - 29 ml/min  Ke 0.0247 T1/2 30 ~ 24 hrs Goal trough 15 - 20 mcg/mL  Height: 5\' 5"  (165.1 cm) Weight: 170 lb (77.1 kg) IBW/kg (Calculated) : 61.5  Temp (24hrs), Avg:100.3 F (37.9 C), Min:100.3 F (37.9 C), Max:100.3 F (37.9 C)  Recent Labs  Lab 02/14/18 2130  WBC 13.9*  CREATININE 2.34*  LATICACIDVEN 1.1    Estimated Creatinine Clearance: 24.5 mL/min (A) (by C-G formula based on SCr of 2.34 mg/dL (H)).    Allergies  Allergen Reactions  . Fenofibrate     Other reaction(s): Headache  . Lisinopril Cough  . Niacin     Other reaction(s): Dizziness  . Phenergan [Promethazine Hcl]     NVD  . Simvastatin     Other reaction(s): Headache    Thank you for allowing pharmacy to be a part of this patient's care.  Tobie Lords, PharmD, BCPS Clinical Pharmacist 02/14/2018

## 2018-02-15 ENCOUNTER — Inpatient Hospital Stay: Payer: Medicare HMO

## 2018-02-15 DIAGNOSIS — Z515 Encounter for palliative care: Secondary | ICD-10-CM

## 2018-02-15 DIAGNOSIS — Z7189 Other specified counseling: Secondary | ICD-10-CM

## 2018-02-15 DIAGNOSIS — J9601 Acute respiratory failure with hypoxia: Secondary | ICD-10-CM

## 2018-02-15 DIAGNOSIS — A419 Sepsis, unspecified organism: Principal | ICD-10-CM

## 2018-02-15 DIAGNOSIS — J441 Chronic obstructive pulmonary disease with (acute) exacerbation: Secondary | ICD-10-CM

## 2018-02-15 LAB — GLUCOSE, CAPILLARY
GLUCOSE-CAPILLARY: 169 mg/dL — AB (ref 70–99)
GLUCOSE-CAPILLARY: 230 mg/dL — AB (ref 70–99)
GLUCOSE-CAPILLARY: 234 mg/dL — AB (ref 70–99)
GLUCOSE-CAPILLARY: 227 mg/dL — AB (ref 70–99)
GLUCOSE-CAPILLARY: 225 mg/dL — AB (ref 70–99)

## 2018-02-15 LAB — URINALYSIS, ROUTINE W REFLEX MICROSCOPIC
Bacteria, UA: NONE SEEN
Bilirubin Urine: NEGATIVE
GLUCOSE, UA: NEGATIVE mg/dL
Ketones, ur: 5 mg/dL — AB
NITRITE: NEGATIVE
PH: 6 (ref 5.0–8.0)
Protein, ur: NEGATIVE mg/dL
Specific Gravity, Urine: 1.011 (ref 1.005–1.030)
Squamous Epithelial / LPF: NONE SEEN (ref 0–5)
WBC, UA: >50 WBC/hpf — ABNORMAL HIGH (ref 0–5)

## 2018-02-15 LAB — CBC
HEMATOCRIT: 35.6 % — AB (ref 40.0–52.0)
Hemoglobin: 11.7 g/dL — ABNORMAL LOW (ref 13.0–18.0)
MCH: 28.6 pg (ref 26.0–34.0)
MCHC: 33 g/dL (ref 32.0–36.0)
MCV: 86.7 fL (ref 80.0–100.0)
Platelets: 189 10*3/uL (ref 150–440)
RBC: 4.1 MIL/uL — AB (ref 4.40–5.90)
RDW: 15.3 % — ABNORMAL HIGH (ref 11.5–14.5)
WBC: 12.3 10*3/uL — AB (ref 3.8–10.6)

## 2018-02-15 LAB — BASIC METABOLIC PANEL
Anion gap: 9 (ref 5–15)
BUN: 16 mg/dL (ref 8–23)
CHLORIDE: 104 mmol/L (ref 98–111)
CO2: 23 mmol/L (ref 22–32)
Calcium: 7.6 mg/dL — ABNORMAL LOW (ref 8.9–10.3)
Creatinine, Ser: 1.92 mg/dL — ABNORMAL HIGH (ref 0.61–1.24)
GFR calc non Af Amer: 32 mL/min — ABNORMAL LOW (ref >60)
GFR, EST AFRICAN AMERICAN: 37 mL/min — AB (ref >60)
Glucose, Bld: 236 mg/dL — ABNORMAL HIGH (ref 70–99)
POTASSIUM: 4 mmol/L (ref 3.5–5.1)
Sodium: 136 mmol/L (ref 135–145)

## 2018-02-15 LAB — MRSA PCR SCREENING: MRSA by PCR: NEGATIVE

## 2018-02-15 LAB — PROCALCITONIN: Procalcitonin: 0.22 ng/mL

## 2018-02-15 LAB — BRAIN NATRIURETIC PEPTIDE: B Natriuretic Peptide: 68 pg/mL (ref 0.0–100.0)

## 2018-02-15 MED ORDER — INSULIN ASPART 100 UNIT/ML ~~LOC~~ SOLN
0.0000 [IU] | Freq: Every day | SUBCUTANEOUS | Status: DC
  Administered 2018-02-15: 2 [IU] via SUBCUTANEOUS
  Filled 2018-02-15: qty 1

## 2018-02-15 MED ORDER — VITAMIN B-12 1000 MCG PO TABS: 1000.0000 ug | ORAL_TABLET | ORAL | Status: DC

## 2018-02-15 MED ORDER — SODIUM CHLORIDE 0.9 % IV SOLN
500.0000 mg | Freq: Every day | INTRAVENOUS | Status: DC
  Administered 2018-02-15: 500 mg via INTRAVENOUS
  Filled 2018-02-15 (×3): qty 500

## 2018-02-15 MED ORDER — HEPARIN SODIUM (PORCINE) 5000 UNIT/ML IJ SOLN
5000.0000 [IU] | Freq: Three times a day (TID) | INTRAMUSCULAR | Status: DC
  Administered 2018-02-15 – 2018-02-16 (×4): 5000 [IU] via SUBCUTANEOUS
  Filled 2018-02-15 (×4): qty 1

## 2018-02-15 MED ORDER — INSULIN GLARGINE 100 UNIT/ML ~~LOC~~ SOLN
10.0000 [IU] | Freq: Every day | SUBCUTANEOUS | Status: DC
  Administered 2018-02-16: 10 [IU] via SUBCUTANEOUS
  Filled 2018-02-15: qty 0.1

## 2018-02-15 MED ORDER — SODIUM CHLORIDE 0.9 % IV SOLN
INTRAVENOUS | Status: DC
  Administered 2018-02-15 – 2018-02-16 (×3): via INTRAVENOUS

## 2018-02-15 MED ORDER — METOPROLOL SUCCINATE ER 50 MG PO TB24
100.0000 mg | ORAL_TABLET | Freq: Every day | ORAL | Status: DC
  Administered 2018-02-15 – 2018-02-16 (×2): 100 mg via ORAL
  Filled 2018-02-15 (×2): qty 2

## 2018-02-15 MED ORDER — PREDNISONE 10 MG PO TABS
50.0000 mg | ORAL_TABLET | Freq: Three times a day (TID) | ORAL | Status: DC
  Administered 2018-02-15: 50 mg via ORAL
  Filled 2018-02-15: qty 5

## 2018-02-15 MED ORDER — PREDNISONE 20 MG PO TABS
40.0000 mg | ORAL_TABLET | Freq: Every day | ORAL | Status: DC
  Administered 2018-02-16: 40 mg via ORAL
  Filled 2018-02-15: qty 2

## 2018-02-15 MED ORDER — FUROSEMIDE 20 MG PO TABS
40.0000 mg | ORAL_TABLET | Freq: Every day | ORAL | Status: DC
  Administered 2018-02-15: 40 mg via ORAL
  Filled 2018-02-15: qty 2

## 2018-02-15 MED ORDER — DOCUSATE SODIUM 100 MG PO CAPS
100.0000 mg | ORAL_CAPSULE | Freq: Two times a day (BID) | ORAL | Status: DC
  Administered 2018-02-16: 09:00:00 100 mg via ORAL
  Filled 2018-02-15 (×3): qty 1

## 2018-02-15 MED ORDER — IPRATROPIUM-ALBUTEROL 0.5-2.5 (3) MG/3ML IN SOLN: 3.0000 mL | Freq: Three times a day (TID) | RESPIRATORY_TRACT | Status: DC

## 2018-02-15 MED ORDER — SODIUM CHLORIDE 0.9 % IV SOLN
1.0000 g | Freq: Every day | INTRAVENOUS | Status: DC
  Administered 2018-02-15: 1 g via INTRAVENOUS
  Filled 2018-02-15: qty 10
  Filled 2018-02-15: qty 1
  Filled 2018-02-15: qty 10

## 2018-02-15 MED ORDER — ACETAMINOPHEN 325 MG PO TABS: 650.0000 mg | ORAL_TABLET | Freq: Four times a day (QID) | ORAL | Status: DC | PRN

## 2018-02-15 MED ORDER — LEVALBUTEROL HCL 0.63 MG/3ML IN NEBU
0.6300 mg | INHALATION_SOLUTION | Freq: Three times a day (TID) | RESPIRATORY_TRACT | Status: DC
  Administered 2018-02-16: 07:00:00 0.63 mg via RESPIRATORY_TRACT
  Filled 2018-02-15: qty 3

## 2018-02-15 MED ORDER — BUDESONIDE 0.25 MG/2ML IN SUSP
0.2500 mg | Freq: Two times a day (BID) | RESPIRATORY_TRACT | Status: DC
  Administered 2018-02-15 – 2018-02-16 (×3): 0.25 mg via RESPIRATORY_TRACT
  Filled 2018-02-15 (×3): qty 2

## 2018-02-15 MED ORDER — BISACODYL 5 MG PO TBEC: 5.0000 mg | DELAYED_RELEASE_TABLET | Freq: Every day | ORAL | Status: DC | PRN

## 2018-02-15 MED ORDER — PRAVASTATIN SODIUM 20 MG PO TABS
20.0000 mg | ORAL_TABLET | Freq: Every day | ORAL | Status: DC
  Administered 2018-02-15: 17:00:00 20 mg via ORAL
  Filled 2018-02-15: qty 1

## 2018-02-15 MED ORDER — INSULIN GLARGINE 100 UNIT/ML ~~LOC~~ SOLN
5.0000 [IU] | Freq: Every day | SUBCUTANEOUS | Status: DC
  Administered 2018-02-15: 5 [IU] via SUBCUTANEOUS
  Filled 2018-02-15 (×2): qty 0.05

## 2018-02-15 MED ORDER — IPRATROPIUM-ALBUTEROL 0.5-2.5 (3) MG/3ML IN SOLN
3.0000 mL | Freq: Four times a day (QID) | RESPIRATORY_TRACT | Status: DC
  Administered 2018-02-15 (×4): 3 mL via RESPIRATORY_TRACT
  Filled 2018-02-15 (×4): qty 3

## 2018-02-15 MED ORDER — ONDANSETRON HCL 4 MG PO TABS: 4.0000 mg | ORAL_TABLET | Freq: Four times a day (QID) | ORAL | Status: DC | PRN

## 2018-02-15 MED ORDER — ACETAMINOPHEN 650 MG RE SUPP: 650.0000 mg | Freq: Four times a day (QID) | RECTAL | Status: DC | PRN

## 2018-02-15 MED ORDER — IPRATROPIUM BROMIDE 0.02 % IN SOLN
0.5000 mg | RESPIRATORY_TRACT | Status: DC | PRN
Start: 2018-02-15 — End: 2018-02-16

## 2018-02-15 MED ORDER — PANTOPRAZOLE SODIUM 40 MG PO TBEC
40.0000 mg | DELAYED_RELEASE_TABLET | Freq: Every day | ORAL | Status: DC
  Administered 2018-02-15 – 2018-02-16 (×2): 40 mg via ORAL
  Filled 2018-02-15 (×2): qty 1

## 2018-02-15 MED ORDER — ASPIRIN EC 81 MG PO TBEC
81.0000 mg | DELAYED_RELEASE_TABLET | Freq: Every day | ORAL | Status: DC
  Administered 2018-02-15 – 2018-02-16 (×2): 81 mg via ORAL
  Filled 2018-02-15 (×2): qty 1

## 2018-02-15 MED ORDER — GABAPENTIN 300 MG PO CAPS
300.0000 mg | ORAL_CAPSULE | Freq: Two times a day (BID) | ORAL | Status: DC
  Administered 2018-02-15 – 2018-02-16 (×3): 300 mg via ORAL
  Filled 2018-02-15 (×3): qty 1

## 2018-02-15 MED ORDER — TRAZODONE HCL 50 MG PO TABS: 25.0000 mg | ORAL_TABLET | Freq: Every evening | ORAL | Status: DC | PRN

## 2018-02-15 MED ORDER — ONDANSETRON HCL 4 MG/2ML IJ SOLN: 4.0000 mg | Freq: Four times a day (QID) | INTRAMUSCULAR | Status: DC | PRN

## 2018-02-15 MED ORDER — INSULIN ASPART 100 UNIT/ML ~~LOC~~ SOLN
0.0000 [IU] | Freq: Three times a day (TID) | SUBCUTANEOUS | Status: DC
  Administered 2018-02-15 (×2): 5 [IU] via SUBCUTANEOUS
  Administered 2018-02-16: 08:00:00 2 [IU] via SUBCUTANEOUS
  Filled 2018-02-15 (×3): qty 1

## 2018-02-15 MED ORDER — TAMSULOSIN HCL 0.4 MG PO CAPS
0.4000 mg | ORAL_CAPSULE | Freq: Every day | ORAL | Status: DC
  Administered 2018-02-15 – 2018-02-16 (×2): 0.4 mg via ORAL
  Filled 2018-02-15 (×2): qty 1

## 2018-02-15 MED ORDER — PANTOPRAZOLE SODIUM 40 MG PO TBEC: 40.0000 mg | DELAYED_RELEASE_TABLET | Freq: Every day | ORAL | Status: DC

## 2018-02-15 MED ORDER — IPRATROPIUM BROMIDE 0.02 % IN SOLN
0.5000 mg | Freq: Three times a day (TID) | RESPIRATORY_TRACT | Status: DC
  Administered 2018-02-16: 07:00:00 0.5 mg via RESPIRATORY_TRACT
  Filled 2018-02-15: qty 2.5

## 2018-02-15 MED ORDER — HYDROCODONE-ACETAMINOPHEN 5-325 MG PO TABS: 1.0000 | ORAL_TABLET | ORAL | Status: DC | PRN

## 2018-02-15 MED ORDER — ORAL CARE MOUTH RINSE
15.0000 mL | Freq: Two times a day (BID) | OROMUCOSAL | Status: DC
  Administered 2018-02-15 – 2018-02-16 (×3): 15 mL via OROMUCOSAL

## 2018-02-15 NOTE — Progress Notes (Signed)
Advanced Care Plan.  Purpose of Encounter: CODE STATUS. Parties in Attendance: Patient, his wife, his daughter and me. Medical Story: GeorgeGrizzleis a66 y.o.malewith a known history of chronic respiratory failure from COPD, on continuous 2 L oxygen at home, CAD, CHF, hypertension, diabetes etc.  The patient is admitted for sepsis due to pneumonia, acute on chronic respiratory failure secondary to pneumonia and COPD exacerbation, acute renal failure on CKD.  The patient was on BiPAP and now on oxygen by nasal cannula.  I discussed with the patient about his current condition, prognosis and CODE STATUS.  He stated that he wants to be resuscitated and intubated if necessary.  Plan:  Code Status: Full code. Time spent discussing advance care planning: 20 minutes.

## 2018-02-15 NOTE — Progress Notes (Addendum)
[facility] [place] NAME: Bobby Peterson    MR#:  [id number]  DATE OF BIRTH:  11/22/1940  SUBJECTIVE:  CHIEF COMPLAINT:   Chief Complaint  Patient presents with  . Shortness of Breath  . Hypotension   Better shortness of breath and cough, off BiPAP, on oxygen by nasal cannula 2 L. REVIEW OF SYSTEMS:  Review of Systems  Constitutional: Positive for malaise/fatigue. Negative for chills and fever.  HENT: Negative for sore throat.   Eyes: Negative for blurred vision and double vision.  Respiratory: Positive for cough, sputum production, shortness of breath and wheezing. Negative for hemoptysis and stridor.   Cardiovascular: Negative for chest pain, palpitations, orthopnea and leg swelling.  Gastrointestinal: Negative for abdominal pain, blood in stool, diarrhea, melena, nausea and vomiting.  Genitourinary: Negative for dysuria, flank pain and hematuria.  Musculoskeletal: Negative for back pain and joint pain.  Skin: Negative for rash.  Neurological: Negative for dizziness, sensory change, focal weakness, seizures, loss of consciousness, weakness and headaches.  Endo/Heme/Allergies: Negative for polydipsia.  Psychiatric/Behavioral: Negative for depression. The patient is not nervous/anxious.     DRUG ALLERGIES:   Allergies  Allergen Reactions  . Fenofibrate     Other reaction(s): Headache  . Lisinopril Cough  . Niacin     Other reaction(s): Dizziness  . Phenergan [Promethazine Hcl]     NVD  . Simvastatin     Other reaction(s): Headache   VITALS:  Blood pressure 124/68, pulse (!) 111, temperature (!) 97.5 F (36.4 C), temperature source Oral, resp. rate 20, height 5\' 5"  (1.651 m), weight 175 lb 11.3 oz (79.7 kg), SpO2 96 %. PHYSICAL EXAMINATION:  Physical Exam  Constitutional: He is oriented to person, place, and time. He appears well-developed.  HENT:  Head: Normocephalic.  Mouth/Throat: Oropharynx is clear and moist.    Eyes: Pupils are equal, round, and reactive to light. Conjunctivae and EOM are normal. No scleral icterus.  Neck: Normal range of motion. Neck supple. No JVD present. No tracheal deviation present.  Cardiovascular: Normal rate, regular rhythm and normal heart sounds. Exam reveals no gallop.  No murmur heard. Pulmonary/Chest: Effort normal. No stridor. No respiratory distress. He has wheezes. He has no rales.  Abdominal: Soft. Bowel sounds are normal. He exhibits no distension. There is no tenderness. There is no rebound.  Musculoskeletal: Normal range of motion. He exhibits no edema or tenderness.  Neurological: He is alert and oriented to person, place, and time. No cranial nerve deficit.  Skin: No rash noted. No erythema.  Psychiatric: He has a normal mood and affect.  Vitals reviewed.  LABORATORY PANEL:  Male CBC Recent Labs  Lab 02/15/18 0453  WBC 12.3*  HGB 11.7*  HCT 35.6*  PLT 189   ------------------------------------------------------------------------------------------------------------------ Chemistries  Recent Labs  Lab 02/14/18 2130 02/15/18 0453  NA 135 136  K 4.6 4.0  CL 96* 104  CO2 29 23  GLUCOSE 127* 236*  BUN 16 16  CREATININE 2.34* 1.92*  CALCIUM 8.6* 7.6*  AST 17  --   ALT 17  --   ALKPHOS 80  --   BILITOT 1.1  --    RADIOLOGY:  Dg Chest Port 1 View  Result Date: 02/15/2018 CLINICAL DATA:  Acute respiratory failure. EXAM: PORTABLE CHEST 1 VIEW COMPARISON:  One-view chest x-ray 02/14/2018 FINDINGS: Heart is mildly enlarged. This is exaggerated by low lung volumes. Left basilar airspace disease remains. Mild pulmonary vascular congestion is noted. IMPRESSION: 1.  Persistent left basilar airspace disease likely reflects a combination of effusion and atelectasis. Infection is not excluded. Electronically Signed   By: San Morelle M.D.   On: 02/15/2018 10:57   Dg Chest Port 1 View  Result Date: 02/14/2018 CLINICAL DATA:  [age]-year-old male  with shortness of breath. EXAM: PORTABLE CHEST 1 VIEW COMPARISON:  Chest radiograph dated 05/10/2016 FINDINGS: Left lung base densities may represent atelectasis/scarring or infiltrate. The right lung is clear. There is blunting of the left costophrenic angle which may represent scarring or small pleural effusion. Stable cardiac silhouette. No acute osseous pathology. IMPRESSION: Left lung base atelectasis/scarring versus infiltrate. Clinical correlation is recommended. Electronically Signed   By: Anner Crete M.D.   On: 02/14/2018 21:32   ASSESSMENT AND PLAN:  [name]  is a 77 y.o. male with a known history of chronic respiratory failure from COPD, on continuous 2 L oxygen at home. Patient presented to emergency room for worsening shortness of breath and productive cough with yellow sputum.  1.  Sepsis secondary to pneumonia CAP.   Continue Zithromax and Rocephin.  Robitussin as needed.  Follow-up CBC and cultures.  2.  Acute respiratory failure on chronic respiratory failure, secondary to pneumonia and COPD exacerbation.   Continue oxygen therapy and off BiPAP support.  Continue steroid, nebulizer as needed.  3. 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  Continue IV fluids.   Follow-up BMP.  Hold Lasix.  4.  Chronic diastolic CHF LV EF: 83% -   65%.  Stable.  Hold Lasix due to acute renal failure and low side blood pressure.  5.  Diabetes.  Increase Lantus to 10 units daily and continue sliding scale.  6.  CAD.  Continue aspirin and Pravachol.  Generalized weakness.  PT evaluation. Discussed with intensivist, Dr. Manuella Ghazi. All the records are reviewed and case discussed with Care Management/Social Worker. Management plans discussed with the patient, his wife and daughter and they are in agreement.  CODE STATUS: Full Code  TOTAL TIME TAKING CARE OF THIS PATIENT: 30 minutes.   More than 50% of the time was spent in counseling/coordination of care: YES  POSSIBLE D/C IN 2-3 DAYS, DEPENDING ON  CLINICAL CONDITION.   Demetrios Loll M.D on 02/15/2018 at 5:01 PM  Between 7am to 6pm - Pager - [phone]  After 6pm go to [email or web address] - [place] [facility]

## 2018-02-15 NOTE — Consult Note (Signed)
Consultation Note Date: 02/15/2018   Patient Name: Bobby Peterson  DOB: 1940-10-24  MRN: [id number]  Age / Sex: 77 y.o., male  PCP: Carmon Ginsberg, [place] Referring Physician: Demetrios Loll, MD  Reason for Consultation: Establishing goals of care  HPI/Patient Profile: 77 y.o. male   admitted on 02/14/2018 with past medical  history of chronic respiratory failure from COPD, on continuous 2 L oxygen at home.  Patient presented to emergency room for worsening shortness of breath and productive cough with yellow sputum going on for the past 24 hours, gradually worsening.  Patient measured his oxygen saturation at home and it was 88% on 2 L nasal cannula.  He also reports subjective fevers, but he did not check his temperature at home.  At the arrival to emergency room patient was noted with low BP 89/53, low-grade fever at 100.3, tachycardia with heart rates at 120 and tachypnea.  His symptoms improved on BiPAP.  Blood tests are remarkable for elevated WBC at 13.9.  Creatinine is elevated at 2.3. Chest x-ray shows left lung base infiltrate.  EKG shows sinus tachycardia, no acute ischemic changes.  Patient is admitted for further evaluation and treatment.  He tells me he was hospitalized twice in the past 4 years for similar situations and required intubation at those times  Patient and family face treatment option decisions advanced directive decisions and anticipatory care needs.   Clinical Assessment and Goals of Care:  This NP Wadie Lessen reviewed medical records, received report from team, assessed the patient and then meet at the patient's bedside along with his wife and daughter  to discuss diagnosis, prognosis, GOC, EOL wishes disposition and options.  Concept of Palliative Care was discussed  A  discussion was had today regarding advanced directives.  Concepts specific to code status was had. Values and  goals of care important to patient and family were attempted to be elicited.  Patient and his family were not very interested in conversation this afternoon.  Patient was able to verbalize very clearly that his main focus is to treat the treatable, hope for improvement and return to his baseline " heading back home"   Family encouraged to call with questions or concerns.  PMT will continue to support as patient allows.    No documented healthcare power of attorney or advanced directives.  Encouraged patient and family to consider securing those documents with the help of spiritual care why he is here in the hospital.  They declined at this time.     SUMMARY OF RECOMMENDATIONS    Code Status/Advance Care Planning:  Full code    Palliative Prophylaxis:   Delirium Protocol and Oral Care  Additional Recommendations (Limitations, Scope, Preferences):  Full Scope Treatment  Psycho-social/Spiritual:   Desire for further Chaplaincy support:yes   Prognosis:   Unable to determine  Discharge Planning: To Be Determined      Primary Diagnoses: Present on Admission: . Acute respiratory failure ([place])   I have reviewed the medical record, interviewed the patient and family,  and examined the patient. The following aspects are pertinent.  Past Medical History:  Diagnosis Date  . Acute respiratory failure with hypoxia ([place]) 12/03/2013   Overview:  Overview:  2 L O2 continuously  Last Assessment & Plan:  Continue 2 L O2 continuously   . Atrial tachycardia ([place]) 04/21/2016  . 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 BP (high blood pressure) 07/08/2015  . Chronic diastolic CHF (congestive heart failure) ([place])   . Chronic diastolic heart failure ([place]) 07/08/2015  . Chronic hypoxemic respiratory failure ([place]) 12/03/2013   2 L O2 continuously   . Chronic respiratory failure ([facility])    a. on home O2  . COPD (chronic obstructive pulmonary disease) ([place])   . COPD (chronic obstructive  pulmonary disease) ([place]) 12/03/2013   May fifth 2015 simple spirometry>> ratio 45%, FEV1 0.95 L (34% per day)   . Coronary artery disease   . Coronary atherosclerosis 07/08/2015   Dr. Ubaldo Glassing   . Esophageal reflux 07/08/2015  . Hernia, umbilical   . Hypercholesteremia   . Hypertension   . Obesity   . Renal cancer ([place])    a. s/p nephrectomy in 1980's  . Smoking 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 pack years 05/10/2016   Social History   Socioeconomic History  . Marital status: Married    Spouse name: Not on file  . Number of children: Not on file  . Years of education: Not on file  . Highest education level: Not on file  Occupational History  . Not on file  Social Needs  . Financial resource strain: Not on file  . Food insecurity:    Worry: Not on file    Inability: Not on file  . Transportation needs:    Medical: Not on file    Non-medical: Not on file  Tobacco Use  . Smoking status: Former Smoker    Packs/day: 1.00    Years: 50.00    Pack years: 50.00    Types: Cigarettes    Last attempt to quit: 12/04/2003    Years since quitting: 14.2  . Smokeless tobacco: Current User    Types: Chew  . Tobacco comment: Has been chewing tobacco since quitting smoking in 2005.  Substance and Sexual Activity  . Alcohol use: No  . Drug use: No  . Sexual activity: Not on file  Lifestyle  . Physical activity:    Days per week: Not on file    Minutes per session: Not on file  . Stress: Not on file  Relationships  . Social connections:    Talks on phone: Not on file    Gets together: Not on file    Attends religious service: Not on file    Active member of club or organization: Not on file    Attends meetings of clubs or organizations: Not on file    Relationship status: Not on file  Other Topics Concern  . Not on file  Social History Narrative  . Not on file   Family History  Problem Relation Age of Onset  . Hypertension Mother    Scheduled Meds: . aspirin EC  81 mg Oral Daily  . budesonide  (PULMICORT) nebulizer solution  0.25 mg Nebulization BID  . docusate sodium  100 mg Oral BID  . gabapentin  300 mg Oral BID  . heparin  5,000 Units Subcutaneous Q8H  . insulin aspart  0-15 Units Subcutaneous TID WC  . insulin aspart  0-5 Units Subcutaneous QHS  . insulin glargine  5  Units Subcutaneous Daily  . ipratropium-albuterol  3 mL Nebulization Q6H  . mouth rinse  15 mL Mouth Rinse BID  . metoprolol succinate  100 mg Oral Daily  . pravastatin  20 mg Oral Daily  . [START ON 02/16/2018] predniSONE  40 mg Oral Q breakfast  . tamsulosin  0.4 mg Oral Daily  . [START ON 02/19/2018] vitamin B-12  1,000 mcg Oral Weekly   Continuous Infusions: . sodium chloride 75 mL/hr at 02/15/18 0400  . azithromycin 500 mg (02/15/18 1200)  . cefTRIAXone (ROCEPHIN)  IV 1 g (02/15/18 1200)   PRN Meds:.acetaminophen **OR** acetaminophen, bisacodyl, HYDROcodone-acetaminophen, ondansetron **OR** ondansetron (ZOFRAN) IV, traZODone Medications Prior to Admission:  Prior to Admission medications   Medication Sig Start Date End Date Taking? Authorizing Provider  albuterol (PROVENTIL HFA;VENTOLIN HFA) 108 (90 Base) MCG/ACT inhaler Inhale 2 puffs into the lungs every 6 (six) hours as needed for wheezing or shortness of breath. 08/02/17  Yes Carmon Ginsberg, PA  aspirin 81 MG tablet Take 81 mg by mouth daily.   Yes [provider]  Fluticasone-Salmeterol (ADVAIR) 250-50 MCG/DOSE AEPB INHALE 1 PUFF TWICE DAILY 01/02/18  Yes Carmon Ginsberg, PA  furosemide (LASIX) 40 MG tablet TAKE 1 TABLET EVERY DAY 10/05/17  Yes Carmon Ginsberg, PA  gabapentin (NEURONTIN) 300 MG capsule TAKE 1 CAPSULE TWICE DAILY 05/12/17  Yes Carmon Ginsberg, PA  metoprolol succinate (TOPROL-XL) 100 MG 24 hr tablet TAKE 1 TABLET DAILY WITH OR IMMEDIATELY FOLLOWING A MEAL. 10/05/17  Yes Carmon Ginsberg, PA  omeprazole (PRILOSEC) 40 MG capsule TAKE 1 CAPSULE DAILY BEFORE BREAKFAST. 05/12/17  Yes Chauvin, Herbie [place], PA  pravastatin (PRAVACHOL) 20 MG  tablet TAKE 1 TABLET EVERY DAY 05/12/17  Yes Carmon Ginsberg, PA  tamsulosin (FLOMAX) 0.4 MG CAPS capsule TAKE 1 CAPSULE EVERY DAY 05/12/17  Yes Carmon Ginsberg, PA  vitamin B-12 (CYANOCOBALAMIN) 1000 MCG tablet Take 1,000 mcg by mouth once a week. *Take on Monday*   Yes [provider]  ciprofloxacin (CIPRO) 500 MG tablet Take 1 tablet (500 mg total) by mouth 2 (two) times daily. Patient not taking: Reported on 02/14/2018 01/03/18   Jules Husbands, MD  metroNIDAZOLE (FLAGYL) 500 MG tablet Take 1 tablet (500 mg total) by mouth 3 (three) times daily. Patient not taking: Reported on 02/14/2018 01/03/18   Jules Husbands, MD  nystatin-triamcinolone ointment [facility]) Apply 1 application topically 2 (two) times daily. Patient not taking: Reported on 02/14/2018 09/26/17   Hollice Espy, MD  OXYGEN Inhale 2 L/min into the lungs continuous.     [provider]  predniSONE (DELTASONE) 20 MG tablet One pill twice daily for 5 days then one pill daily for 5 days Patient not taking: Reported on 02/14/2018 01/26/18   Carmon Ginsberg, PA   Allergies  Allergen Reactions  . Fenofibrate     Other reaction(s): Headache  . Lisinopril Cough  . Niacin     Other reaction(s): Dizziness  . Phenergan [Promethazine Hcl]     NVD  . Simvastatin     Other reaction(s): Headache   Review of Systems  Neurological: Positive for weakness.    Physical Exam  Constitutional: He appears well-developed. He appears ill.  Cardiovascular: Tachycardia present.  Pulmonary/Chest: Tachypnea noted. He has decreased breath sounds in the right lower field and the left lower field.  Neurological: He is alert.  Skin: Skin is warm and dry.    Vital Signs: BP 135/64   Pulse 98   Temp 98 F (36.7 C) (Oral)  Resp (!) 21   Ht 5\' 5"  (1.651 m)   Wt 79.7 kg (175 lb 11.3 oz)   SpO2 96%   BMI 29.24 kg/m  Pain Scale: 0-10   Pain Score: 0-No pain   SpO2: SpO2: 96 % O2 Device:SpO2: 96 % O2 Flow Rate: .O2 Flow Rate  (L/min): 2 L/min  IO: Intake/output summary:   Intake/Output Summary (Last 24 hours) at 02/15/2018 1406 Last data filed at 02/15/2018 1343 Gross per 24 hour  Intake 382.5 ml  Output 200 ml  Net 182.5 ml    LBM:   Baseline Weight: Weight: 77.1 kg (170 lb) Most recent weight: Weight: 79.7 kg (175 lb 11.3 oz)     Palliative Assessment/Data:   60%     Time In: 1400 Time Out: 1450 Time Total: 50 minutes Greater than 50%  of this time was spent counseling and coordinating care related to the above assessment and plan.  Signed by: Wadie Lessen, NP   Please contact Palliative Medicine Team phone at [phone] for questions and concerns.  For individual provider: See Shea Evans

## 2018-02-15 NOTE — Progress Notes (Signed)
Patient wife flushed urine x1 and educated to not dump urine out without staff being able to measure the amount.

## 2018-02-15 NOTE — Progress Notes (Signed)
Pt admitted to unit. Upon arrival pt transition from Bipap to 4L [place]. Pt denies pain at this time. Pt showing no signs of SOB or increased WOB. Wife at bedside, took home patients keys and other personal belongings. NP came to bedside explained plan of care to patient. Will continue to monitor.

## 2018-02-15 NOTE — Consult Note (Signed)
Name: Bobby Peterson MRN: [id number] DOB: 03-02-1941    ADMISSION DATE:  02/14/2018 CONSULTATION DATE: 02/15/2018  REFERRING MD : Dr. Duane Boston   CHIEF COMPLAINT: Shortness of Breath   BRIEF PATIENT DESCRIPTION:  77 yo male admitted with acute on chronic hypoxic respiratory failure secondary to pneumonia and AECOPD requiring Bipap   SIGNIFICANT EVENTS/STUDIES:  07/17 Pt admitted to stepdown unit   HISTORY OF PRESENT ILLNESS:   This is a 77 yo male with a PMH of HTN, Obesity, Renal Cancer, HTN, Hypercholesteremia, GERD, CAD, COPD on chronic home O2 @2L , Chronic Diastolic CHF, Atrial Tachycardia, and Umbilical Hernia.  He presented to [facility] ER on 07/17 with shortness of breath, congested cough, and low grade fevers onset of symptoms 3 days prior to presentation.  Despite his chronic home O2 @2L  his O2 sats were 88%.  Upon arrival to the ER he remained in respiratory distress requiring Bipap.  CXR concerning for pneumonia vs. atelectasis.  Sepsis protocol initiated he received 2.5L NS bolus and iv abx.  He was subsequently admitted to the stepdown unit by hospitalist team for further workup and treatment.    PAST MEDICAL HISTORY :   has a past medical history of Acute respiratory failure with hypoxia ([place]) (12/03/2013), Atrial tachycardia ([place]) (04/21/2016), Benign fibroma of prostate (07/08/2015), BP (high blood pressure) (07/08/2015), Chronic diastolic CHF (congestive heart failure) ([place]), Chronic diastolic heart failure ([place]) (07/08/2015), Chronic hypoxemic respiratory failure ([place]) (12/03/2013), Chronic respiratory failure ([place]), COPD (chronic obstructive pulmonary disease) ([employer]), COPD (chronic obstructive pulmonary disease) ([place]) (12/03/2013), Coronary artery disease, Coronary atherosclerosis (07/08/2015), Esophageal reflux (82/11/35), Hernia, umbilical, Hypercholesteremia, Hypertension, Obesity, Renal cancer ([place]), and Smoking 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 pack years (05/10/2016).  has a past surgical history that  includes Nephrectomy (1990); Colonoscopy; Cholecystectomy (N/A, 12/22/2014); Umbilical hernia repair (N/A, 12/22/2014); Omentectomy (12/22/2014); ERCP (N/A, 01/05/2015); ERCP (N/A, 02/19/2015); Stent removal; and Gallbladder surgery (11/2014). Prior to Admission medications   Medication Sig Start Date End Date Taking? Authorizing Provider  albuterol (PROVENTIL HFA;VENTOLIN HFA) 108 (90 Base) MCG/ACT inhaler Inhale 2 puffs into the lungs every 6 (six) hours as needed for wheezing or shortness of breath. 08/02/17  Yes Carmon Ginsberg, PA  aspirin 81 MG tablet Take 81 mg by mouth daily.   Yes [provider]  Fluticasone-Salmeterol (ADVAIR) 250-50 MCG/DOSE AEPB INHALE 1 PUFF TWICE DAILY 01/02/18  Yes Carmon Ginsberg, PA  furosemide (LASIX) 40 MG tablet TAKE 1 TABLET EVERY DAY 10/05/17  Yes Carmon Ginsberg, PA  gabapentin (NEURONTIN) 300 MG capsule TAKE 1 CAPSULE TWICE DAILY 05/12/17  Yes Carmon Ginsberg, PA  metoprolol succinate (TOPROL-XL) 100 MG 24 hr tablet TAKE 1 TABLET DAILY WITH OR IMMEDIATELY FOLLOWING A MEAL. 10/05/17  Yes Carmon Ginsberg, PA  omeprazole (PRILOSEC) 40 MG capsule TAKE 1 CAPSULE DAILY BEFORE BREAKFAST. 05/12/17  Yes Chauvin, Herbie [place], PA  pravastatin (PRAVACHOL) 20 MG tablet TAKE 1 TABLET EVERY DAY 05/12/17  Yes Carmon Ginsberg, PA  tamsulosin (FLOMAX) 0.4 MG CAPS capsule TAKE 1 CAPSULE EVERY DAY 05/12/17  Yes Carmon Ginsberg, PA  vitamin B-12 (CYANOCOBALAMIN) 1000 MCG tablet Take 1,000 mcg by mouth once a week. *Take on Monday*   Yes [provider]  ciprofloxacin (CIPRO) 500 MG tablet Take 1 tablet (500 mg total) by mouth 2 (two) times daily. Patient not taking: Reported on 02/14/2018 01/03/18   Jules Husbands, MD  metroNIDAZOLE (FLAGYL) 500 MG tablet Take 1 tablet (500 mg total) by mouth 3 (three) times daily. Patient not taking: Reported on 02/14/2018 01/03/18  Pabon, Diego F, MD  nystatin-triamcinolone ointment (MYCOLOG) Apply 1 application topically 2 (two) times  daily. Patient not taking: Reported on 02/14/2018 09/26/17   Hollice Espy, MD  OXYGEN Inhale 2 L/min into the lungs continuous.     [provider]  predniSONE (DELTASONE) 20 MG tablet One pill twice daily for 5 days then one pill daily for 5 days Patient not taking: Reported on 02/14/2018 01/26/18   Carmon Ginsberg, PA   Allergies  Allergen Reactions  . Fenofibrate     Other reaction(s): Headache  . Lisinopril Cough  . Niacin     Other reaction(s): Dizziness  . Phenergan [Promethazine Hcl]     NVD  . Simvastatin     Other reaction(s): Headache    FAMILY HISTORY:  family history includes Hypertension in his mother. SOCIAL HISTORY:  reports that he quit smoking about 14 years ago. His smoking use included cigarettes. He has a 50.00 pack-year smoking history. His smokeless tobacco use includes chew. He reports that he does not drink alcohol or use drugs.  REVIEW OF SYSTEMS: Positives in BOLD  Constitutional: fever, chills, weight loss, malaise/fatigue and diaphoresis.  HENT: Negative for hearing loss, ear pain, nosebleeds, congestion, sore throat, neck pain, tinnitus and ear discharge.   Eyes: Negative for blurred vision, double vision, photophobia, pain, discharge and redness.  Respiratory: cough, hemoptysis, sputum production, shortness of breath, wheezing and stridor.   Cardiovascular: Negative for chest pain, palpitations, orthopnea, claudication, leg swelling and PND.  Gastrointestinal: Negative for heartburn, nausea, vomiting, abdominal pain, diarrhea, constipation, blood in stool and melena.  Genitourinary: Negative for dysuria, urgency, frequency, hematuria and flank pain.  Musculoskeletal: Negative for myalgias, back pain, joint pain and falls.  Skin: Negative for itching and rash.  Neurological: Negative for dizziness, tingling, tremors, sensory change, speech change, focal weakness, seizures, loss of consciousness, weakness and headaches.  Endo/Heme/Allergies:  Negative for environmental allergies and polydipsia. Does not bruise/bleed easily.  SUBJECTIVE:  States he feels better currently off Bipap   VITAL SIGNS: Temp:  [98.5 F (36.9 C)-100.3 F (37.9 C)] 98.5 F (36.9 C) (07/18 0052) Pulse Rate:  [111-124] 117 (07/18 0000) Resp:  [17-32] 21 (07/18 0000) BP: (82-116)/(53-66) 111/66 (07/18 0000) SpO2:  [92 %-100 %] 96 % (07/18 0000) FiO2 (%):  [96 %] 96 % (07/18 0100) Weight:  [77.1 kg (170 lb)-79.7 kg (175 lb 11.3 oz)] 79.7 kg (175 lb 11.3 oz) (07/18 0100)  PHYSICAL EXAMINATION: General: well developed, well nourished male resting in bed, NAD  Neuro: alert and oriented, follows commands  HEENT: supple, no JVD  Cardiovascular: nsr, rrr, no R/G Lungs: rhonchi and expiratory wheezes throughout, even, non labored  Abdomen: +BS x4, obese, soft, non tender, non distended  Musculoskeletal: normal bulk and tone, no edema  Skin: intact no rashes or lesions present   Recent Labs  Lab 02/14/18 2130  NA 135  K 4.6  CL 96*  CO2 29  BUN 16  CREATININE 2.34*  GLUCOSE 127*   Recent Labs  Lab 02/14/18 2130  HGB 14.1  HCT 42.0  WBC 13.9*  PLT 243   Dg Chest Port 1 View  Result Date: 02/14/2018 CLINICAL DATA:  [age]-year-old male with shortness of breath. EXAM: PORTABLE CHEST 1 VIEW COMPARISON:  Chest radiograph dated 05/10/2016 FINDINGS: Left lung base densities may represent atelectasis/scarring or infiltrate. The right lung is clear. There is blunting of the left costophrenic angle which may represent scarring or small pleural effusion. Stable cardiac silhouette. No acute  osseous pathology. IMPRESSION: Left lung base atelectasis/scarring versus infiltrate. Clinical correlation is recommended. Electronically Signed   By: Anner Crete M.D.   On: 02/14/2018 21:32    ASSESSMENT / PLAN: Acute on chronic hypoxic respiratory failure secondary to pneumonia and AECOPD  Acute renal failure  Hx: Chronic Home O2 @2L , Chronic Diastolic CHF,  Atrial Tachycardia, GERD, HTN, and CAD P: Prn Bipap or supplemental O2 for dyspnea and/or hypoxia  Scheduled and prn bronchodilator therapy  Will add nebulized steroids  Continue prednisone Continuous telemetry monitoring  BNP pending  Continue outpatient cardiac medications  Trend WBC and monitor fever curve Trend PCT  Follow cultures  Continue vancomycin and cefepime for now, if MRSA PCR negative will d/c vancomycin  Trend BMP  Replace electrolytes as indicated  Monitor UOP  Avoid nephrotoxic medications  VTE px: subq heparin  Trend CBC  Monitor for s/sx of bleeding and transfuse for hgb <7  Marda Stalker, [place] Pager [phone] (please enter 7 digits) PCCM Consult Pager [phone] (please enter 7 digits)

## 2018-02-15 NOTE — Evaluation (Signed)
Physical Therapy Evaluation Patient Details Name: Bobby Peterson MRN: [id number] DOB: 02/18/1941 Today's Date: 02/15/2018   History of Present Illness  Bobby Peterson  is a 77 y.o. male with a known history of chronic respiratory failure from COPD, on continuous 2 L oxygen at home. Pt was placed on BiPAP and admitted to hospital for further evaluation.   Clinical Impression  Pt responds well to therapy. Pt was able to perform bed mobility w/ bed rails mod ind without cueing and with relative ease. Pt shows good overall strength, standing without hesitation and min assist. Pt shows some balance limitations, needing min assist from therapist to maintain center of gravity in safe position when weight shifting during lateral stepping needed for transfer to recliner. Pt vitals remained normal during all activity. Pt was eager to eat and was moved to recliner for lunch, limiting treatment time. Pt overall mobility appears good and  pt reports accessible home environment. Would benefit from skilled PT to address above deficits and promote optimal return to PLOF. Recommend transition to [place] upon discharge from acute hospitalization.    Follow Up Recommendations Home health PT    Equipment Recommendations  Rolling walker with 5" wheels    Recommendations for Other Services       Precautions / Restrictions Precautions Precautions: Fall Restrictions Weight Bearing Restrictions: No      Mobility  Bed Mobility Overal bed mobility: Modified Independent             General bed mobility comments: Uses rails and requires increased time and effort  Transfers Overall transfer level: Needs assistance   Transfers: Sit to/from Stand;Stand Pivot Transfers Sit to Stand: Min assist Stand pivot transfers: Min assist       General transfer comment: Pt strong and able to stand ind, however needs min assist for balance. Side steps to recliner from bed with min to  CGA  Ambulation/Gait Ambulation/Gait assistance: (Plan to further assess Gait next session, pt did show lat stepping ability during stand pivot trasnfer with min assist)     Plan to further assess Gait next session, pt did show lat stepping ability during stand pivot trasnfer with min assist          Stairs            Wheelchair Mobility    Modified Rankin (Stroke Patients Only)       Balance Overall balance assessment: Needs assistance Sitting-balance support: Feet supported;No upper extremity supported Sitting balance-Leahy Scale: Good     Standing balance support: No upper extremity supported Standing balance-Leahy Scale: Fair Standing balance comment: Balance should be further assessed at next session ( limited due to pt wanting to sit in order to eat lunch)                             Pertinent Vitals/Pain Pain Assessment: No/denies pain    Home Living Family/patient expects to be discharged to:: Private residence Living Arrangements: Spouse/significant other Available Help at Discharge: Family Type of Home: House Home Access: Ramped entrance     Home Layout: One level        Prior Function Level of Independence: Independent         Comments: Ind with ambulation and ADL's, however reports only ambulating short distances.      Hand Dominance        Extremity/Trunk Assessment   Upper Extremity Assessment Upper Extremity Assessment: Overall WFL for tasks  assessed    Lower Extremity Assessment Lower Extremity Assessment: Overall WFL for tasks assessed    Cervical / Trunk Assessment Cervical / Trunk Assessment: Kyphotic  Communication   Communication: No difficulties  Cognition Arousal/Alertness: Awake/alert Behavior During Therapy: WFL for tasks assessed/performed Overall Cognitive Status: Within Functional Limits for tasks assessed                                        General Comments       Exercises Other Exercises Other Exercises: Bed mobility: Supne to sit mod ind; Transfers: Sit to/from stand: min assist; Stand pivot transfer: min assist    Assessment/Plan    PT Assessment Patient needs continued PT services  PT Problem List Decreased strength;Decreased mobility;Decreased activity tolerance;Decreased balance       PT Treatment Interventions DME instruction;Gait training;Functional mobility training;Balance training;Therapeutic exercise;Therapeutic activities;Patient/family education    PT Goals (Current goals can be found in the Care Plan section)       Frequency Min 2X/week   Barriers to discharge        Co-evaluation               AM-PAC PT "6 Clicks" Daily Activity  Outcome Measure Difficulty turning over in bed (including adjusting bedclothes, sheets and blankets)?: Unable Difficulty moving from lying on back to sitting on the side of the bed? : Unable Difficulty sitting down on and standing up from a chair with arms (e.g., wheelchair, bedside commode, etc,.)?: Unable Help needed moving to and from a bed to chair (including a wheelchair)?: A Little Help needed walking in hospital room?: A Little Help needed climbing 3-5 steps with a railing? : A Lot 6 Click Score: 11    End of Session Equipment Utilized During Treatment: Gait belt Activity Tolerance: Patient tolerated treatment well Patient left: in chair;with family/visitor present;with call bell/phone within reach(Eating lunch) Nurse Communication: Mobility status(Relayed pt is in chair) PT Visit Diagnosis: Unsteadiness on feet (R26.81);Other abnormalities of gait and mobility (R26.89);Difficulty in walking, not elsewhere classified (R26.2)    Time: 4128-7867 PT Time Calculation (min) (ACUTE ONLY): 15 min   Charges:         PT G Codes:          Colby Kellyanne Ellwanger,SPT 02/15/2018, 1:11 PM

## 2018-02-15 NOTE — Progress Notes (Signed)
Report given to Coralyn Mark, RN for patient to be transferred to room 72, wife and daughter at bedside during transfer.

## 2018-02-15 NOTE — Progress Notes (Signed)
Pharmacy Antibiotic Note  Bobby Peterson is a 77 y.o. male admitted on 02/14/2018 with left lower lobe pneumona. Patient has history significant for chronic respiratory failure from COPD. Patient initially treated with cefepime and vancomycin.   Plan: Per AM ICU rounds patient transitioned to azithromycin 500mg  IV Daily and Ceftriaxone 1g IV Daily.    Height: 5\' 5"  (165.1 cm) Weight: 175 lb 11.3 oz (79.7 kg) IBW/kg (Calculated) : 61.5  Temp (24hrs), Avg:99 F (37.2 C), Min:98 F (36.7 C), Max:100.3 F (37.9 C)  Recent Labs  Lab 02/14/18 2130 02/15/18 0453  WBC 13.9* 12.3*  CREATININE 2.34* 1.92*  LATICACIDVEN 1.1  --     Estimated Creatinine Clearance: 30.4 mL/min (A) (by C-G formula based on SCr of 1.92 mg/dL (H)).    Allergies  Allergen Reactions  . Fenofibrate     Other reaction(s): Headache  . Lisinopril Cough  . Niacin     Other reaction(s): Dizziness  . Phenergan [Promethazine Hcl]     NVD  . Simvastatin     Other reaction(s): Headache    Antimicrobials this admission: Cefepime 7/18 x 1 Vancomycin 7/18 x 1 Azithromycin 7/18  >>  Ceftriaxone 7/18 >>   Dose adjustments this admission: N/A  Microbiology results: 7/17 BCx: no growth < 12 hours  7/17 UCx: pending   7/17 Sputum: pending  7/18 MRSA PCR: negative   Thank you for allowing pharmacy to be a part of this patient's care.  Amando Ishikawa L 02/15/2018 11:43 AM

## 2018-02-15 NOTE — ED Notes (Signed)
Family at bedside. 

## 2018-02-16 ENCOUNTER — Telehealth: Payer: Self-pay | Admitting: Family Medicine

## 2018-02-16 LAB — URINE CULTURE

## 2018-02-16 LAB — BASIC METABOLIC PANEL
ANION GAP: 7 (ref 5–15)
BUN: 21 mg/dL (ref 8–23)
CALCIUM: 7.8 mg/dL — AB (ref 8.9–10.3)
CO2: 26 mmol/L (ref 22–32)
Chloride: 108 mmol/L (ref 98–111)
Creatinine, Ser: 1.61 mg/dL — ABNORMAL HIGH (ref 0.61–1.24)
GFR calc Af Amer: 45 mL/min — ABNORMAL LOW (ref >60)
GFR calc non Af Amer: 39 mL/min — ABNORMAL LOW (ref >60)
GLUCOSE: 220 mg/dL — AB (ref 70–99)
POTASSIUM: 3.9 mmol/L (ref 3.5–5.1)
Sodium: 141 mmol/L (ref 135–145)

## 2018-02-16 LAB — MAGNESIUM: Magnesium: 2 mg/dL (ref 1.7–2.4)

## 2018-02-16 LAB — GLUCOSE, CAPILLARY: GLUCOSE-CAPILLARY: 146 mg/dL — AB (ref 70–99)

## 2018-02-16 MED ORDER — GUAIFENESIN-DM 100-10 MG/5ML PO SYRP: 5.0000 mL | ORAL_SOLUTION | ORAL | 0 refills | Status: DC | PRN

## 2018-02-16 MED ORDER — PREDNISONE 20 MG PO TABS: 40.0000 mg | ORAL_TABLET | Freq: Every day | ORAL | 0 refills | Status: DC

## 2018-02-16 MED ORDER — LEVOFLOXACIN 250 MG PO TABS: 250.0000 mg | ORAL_TABLET | Freq: Every day | ORAL | 0 refills | Status: AC

## 2018-02-16 MED ORDER — LEVOFLOXACIN 250 MG PO TABS: 250.0000 mg | ORAL_TABLET | Freq: Every day | ORAL | 0 refills | Status: DC

## 2018-02-16 NOTE — Progress Notes (Signed)
Discussed discharge instructions and medications with patient and his wife. IV removed. All questions addressed.  Patient placed on portable O2 tank for home. Patient transported home via car by his wife.  Clarise Cruz, RN, BSN

## 2018-02-16 NOTE — Discharge Instructions (Signed)
HHPT °Fall precaution. °

## 2018-02-16 NOTE — Care Management (Signed)
Patient and his wife are declining home health very firmly.  has portable 02 tank for transport home.  Notified attending

## 2018-02-16 NOTE — Discharge Summary (Signed)
Bobby Peterson at [facility] NAME: Bobby Peterson    MR#:  [id number]  DATE OF BIRTH:  20-May-1941  DATE OF ADMISSION:  02/14/2018   ADMITTING PHYSICIAN: Amelia Jo, MD  DATE OF DISCHARGE: 02/16/2018 PRIMARY CARE PHYSICIAN: Carmon Ginsberg, PA   ADMISSION DIAGNOSIS:  COPD exacerbation ([place]) [J44.1] HCAP (healthcare-associated pneumonia) [J18.9] Sepsis, due to unspecified organism ([place]) [A41.9] DISCHARGE DIAGNOSIS:  Active Problems:   COPD exacerbation ([place])   Acute respiratory failure ([place])   DNR (do not resuscitate) discussion   Palliative care by specialist  SECONDARY DIAGNOSIS:   Past Medical History:  Diagnosis Date  . Acute respiratory failure with hypoxia ([place]) 12/03/2013   Overview:  Overview:  2 L O2 continuously  Last Assessment & Plan:  Continue 2 L O2 continuously   . Atrial tachycardia ([place]) 04/21/2016  . 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 BP (high blood pressure) 07/08/2015  . Chronic diastolic CHF (congestive heart failure) ([place])   . Chronic diastolic heart failure ([place]) 07/08/2015  . Chronic hypoxemic respiratory failure ([place]) 12/03/2013   2 L O2 continuously   . Chronic respiratory failure ([facility])    a. on home O2  . COPD (chronic obstructive pulmonary disease) ([place])   . COPD (chronic obstructive pulmonary disease) ([place]) 12/03/2013   May fifth 2015 simple spirometry>> ratio 45%, FEV1 0.95 L (34% per day)   . Coronary artery disease   . Coronary atherosclerosis 07/08/2015   Dr. Ubaldo Glassing   . Esophageal reflux 07/08/2015  . Hernia, umbilical   . Hypercholesteremia   . Hypertension   . Obesity   . Renal cancer ([place])    a. s/p nephrectomy in 1980's  . Smoking 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 pack years 05/10/2016   HOSPITAL COURSE:  Bobby Peterson a77 y.o.malewith a known history of chronic respiratory failure from COPD, on continuous 2 L oxygen at home. Patient presented to emergency room for worsening shortness of breath and  productive cough with yellow sputum.  1.Sepsis secondary to pneumonia CAP. He is treated with Zithromax and Rocephin iv.  Robitussin as needed. negative blood cultures so far. Leukocytosis improved. Change to po levaquin for 5 days.  2.Acute respiratory failure on chronic respiratory failure,secondary to pneumonia and COPD exacerbation. Continue home oxygen 2 L [place] and off BiPAP support.  Continue prednisone for 4 days, nebulizer as needed.  3. 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 Improving with IVF, resume Lasix after dischage.  4.  Chronic diastolic CHF LV EF: 13% - 65%.  Stable.  Hold Lasix due to acute renal failure and low side blood pressure. Resume after discharge.  5.  Diabetes.  Increased Lantus to 10 units daily and on sliding scale. BS is controlled.  6.  CAD.  Continue aspirin and Pravachol.  Generalized weakness.  PT evaluation: HHPT. DISCHARGE CONDITIONS:  Stable, discharge to home with HHPT. CONSULTS OBTAINED:  Treatment Team:  Pccm, Ander Gaster, MD Lahoma Rocker, MD DRUG ALLERGIES:   Allergies  Allergen Reactions  . Fenofibrate     Other reaction(s): Headache  . Lisinopril Cough  . Niacin     Other reaction(s): Dizziness  . Phenergan [Promethazine Hcl]     NVD  . Simvastatin     Other reaction(s): Headache   DISCHARGE MEDICATIONS:   Allergies as of 02/16/2018      Reactions   Fenofibrate    Other reaction(s): Headache   Lisinopril Cough   Niacin    Other reaction(s): Dizziness   Phenergan [promethazine Hcl]  NVD   Simvastatin    Other reaction(s): Headache      Medication List    STOP taking these medications   ciprofloxacin 500 MG tablet Commonly known as:  CIPRO   metroNIDAZOLE 500 MG tablet Commonly known as:  FLAGYL     TAKE these medications   albuterol 108 (90 Base) MCG/ACT inhaler Commonly known as:  PROVENTIL HFA;VENTOLIN HFA Inhale 2 puffs into the lungs every 6 (six) hours as needed for wheezing or shortness of  breath.   aspirin 81 MG tablet Take 81 mg by mouth daily.   Fluticasone-Salmeterol 250-50 MCG/DOSE Aepb Commonly known as:  ADVAIR INHALE 1 PUFF TWICE DAILY   furosemide 40 MG tablet Commonly known as:  LASIX TAKE 1 TABLET EVERY DAY   gabapentin 300 MG capsule Commonly known as:  NEURONTIN TAKE 1 CAPSULE TWICE DAILY   guaiFENesin-dextromethorphan 100-10 MG/5ML syrup Commonly known as:  ROBITUSSIN DM Take 5 mLs by mouth every 4 (four) hours as needed for cough.   levofloxacin 250 MG tablet Commonly known as:  LEVAQUIN Take 1 tablet (250 mg total) by mouth daily for 5 days.   metoprolol succinate 100 MG 24 hr tablet Commonly known as:  TOPROL-XL TAKE 1 TABLET DAILY WITH OR IMMEDIATELY FOLLOWING A MEAL.   nystatin-triamcinolone ointment Commonly known as:  MYCOLOG Apply 1 application topically 2 (two) times daily.   omeprazole 40 MG capsule Commonly known as:  PRILOSEC TAKE 1 CAPSULE DAILY BEFORE BREAKFAST.   OXYGEN Inhale 2 L/min into the lungs continuous.   pravastatin 20 MG tablet Commonly known as:  PRAVACHOL TAKE 1 TABLET EVERY DAY   predniSONE 20 MG tablet Commonly known as:  DELTASONE Take 2 tablets (40 mg total) by mouth daily with breakfast. Start taking on:  02/17/2018 What changed:    how much to take  how to take this  when to take this  additional instructions   tamsulosin 0.4 MG Caps capsule Commonly known as:  FLOMAX TAKE 1 CAPSULE EVERY DAY   vitamin B-12 1000 MCG tablet Commonly known as:  CYANOCOBALAMIN Take 1,000 mcg by mouth once a week. *Take on Monday*            Durable Medical Equipment  (From admission, onward)        Start     Ordered   02/16/18 0845  For home use only DME 4 wheeled rolling walker with seat  Once    Question:  Patient needs a walker to treat with the following condition  Answer:  Weakness generalized   02/16/18 0846       DISCHARGE INSTRUCTIONS:  See AVS.  If you experience worsening of your  admission symptoms, develop shortness of breath, life threatening emergency, suicidal or homicidal thoughts you must seek medical attention immediately by calling 911 or calling your MD immediately  if symptoms less severe.  You Must read complete instructions/literature along with all the possible adverse reactions/side effects for all the Medicines you take and that have been prescribed to you. Take any new Medicines after you have completely understood and accpet all the possible adverse reactions/side effects.   Please note  You were cared for by a hospitalist during your hospital stay. If you have any questions about your discharge medications or the care you received while you were in the hospital after you are discharged, you can call the unit and asked to speak with the hospitalist on call if the hospitalist that took care of you is not  available. Once you are discharged, your primary care physician will handle any further medical issues. Please note that NO REFILLS for any discharge medications will be authorized once you are discharged, as it is imperative that you return to your primary care physician (or establish a relationship with a primary care physician if you do not have one) for your aftercare needs so that they can reassess your need for medications and monitor your lab values.    On the day of Discharge:  VITAL SIGNS:  Blood pressure (!) 114/57, pulse 63, temperature (!) 97.5 F (36.4 C), temperature source Oral, resp. rate 18, height 5\' 5"  (1.651 m), weight 179 lb 11.2 oz (81.5 kg), SpO2 95 %. PHYSICAL EXAMINATION:  GENERAL:  77 y.o.-year-old patient lying in the bed with no acute distress.  EYES: Pupils equal, round, reactive to light and accommodation. No scleral icterus. Extraocular muscles intact.  HEENT: Head atraumatic, normocephalic. Oropharynx and nasopharynx clear.  NECK:  Supple, no jugular venous distention. No thyroid enlargement, no tenderness.  LUNGS: Normal  breath sounds bilaterally, no wheezing, rales,rhonchi or crepitation. No use of accessory muscles of respiration.  CARDIOVASCULAR: S1, S2 normal. No murmurs, rubs, or gallops.  ABDOMEN: Soft, non-tender, non-distended. Bowel sounds present. No organomegaly or mass.  EXTREMITIES: No pedal edema, cyanosis, or clubbing.  NEUROLOGIC: Cranial nerves II through XII are intact. Muscle strength 5/5 in all extremities. Sensation intact. Gait not checked.  PSYCHIATRIC: The patient is alert and oriented x 3.  SKIN: No obvious rash, lesion, or ulcer.  DATA REVIEW:   CBC Recent Labs  Lab 02/15/18 0453  WBC 12.3*  HGB 11.7*  HCT 35.6*  PLT 189    Chemistries  Recent Labs  Lab 02/14/18 2130  02/16/18 0316  NA 135   < > 141  K 4.6   < > 3.9  CL 96*   < > 108  CO2 29   < > 26  GLUCOSE 127*   < > 220*  BUN 16   < > 21  CREATININE 2.34*   < > 1.61*  CALCIUM 8.6*   < > 7.8*  MG  --   --  2.0  AST 17  --   --   ALT 17  --   --   ALKPHOS 80  --   --   BILITOT 1.1  --   --    < > = values in this interval not displayed.     Microbiology Results  Results for orders placed or performed during the hospital encounter of 02/14/18  Blood Culture (routine x 2)     Status: None (Preliminary result)   Collection Time: 02/14/18  9:30 PM  Result Value Ref Range Status   Specimen Description BLOOD RIGHT ANTECUBITAL  Final   Special Requests   Final    BOTTLES DRAWN AEROBIC AND ANAEROBIC Blood Culture adequate volume   Culture   Final    NO GROWTH 2 DAYS Performed at [facility], [address], [place], [place] [address]    Report Status PENDING  Incomplete  Blood Culture (routine x 2)     Status: None (Preliminary result)   Collection Time: 02/14/18  9:30 PM  Result Value Ref Range Status   Specimen Description BLOOD BLOOD LEFT WRIST  Final   Special Requests   Final    BOTTLES DRAWN AEROBIC AND ANAEROBIC Blood Culture adequate volume   Culture   Final    NO GROWTH 2  DAYS Performed  at [facility], [address], [place], [place] [address]    Report Status PENDING  Incomplete  Urine culture     Status: Abnormal   Collection Time: 02/15/18 12:30 AM  Result Value Ref Range Status   Specimen Description   Final    URINE, RANDOM Performed at [facility], [address] [place], [place] [address]    Special Requests   Final    NONE Performed at [facility], [address] [place], [place] [address]    Culture (A)  Final    <10,000 COLONIES/mL INSIGNIFICANT GROWTH Performed at [facility] [address], [place], [place] [address]    Report Status 02/16/2018 FINAL  Final  MRSA PCR Screening     Status: None   Collection Time: 02/15/18  1:42 AM  Result Value Ref Range Status   MRSA by PCR NEGATIVE NEGATIVE Final    Comment:        The GeneXpert MRSA Assay (FDA approved for NASAL specimens only), is one component of a comprehensive MRSA colonization surveillance program. It is not intended to diagnose MRSA infection nor to guide or monitor treatment for MRSA infections. Performed at [facility], [address], [place], [place] [address]     RADIOLOGY:  No results found.   Management plans discussed with the patient, his wife and they are in agreement.  CODE STATUS: Full Code   TOTAL TIME TAKING CARE OF THIS PATIENT: 32 minutes.    Demetrios Loll M.D on 02/16/2018 at 9:05 AM  Between 7am to 6pm - Pager - [phone]  After 6pm go to [email or web address] - Proofreader  [facility]  Office  [phone]  CC: Primary care physician; Carmon Ginsberg, PA   Note: This dictation was prepared with Dragon dictation along with smaller phrase technology. Any transcriptional errors that result from this process are unintentional.

## 2018-02-16 NOTE — Evaluation (Addendum)
Occupational Therapy Evaluation Patient Details Name: Bobby Peterson MRN: [id number] DOB: [birth year] Today's Date: 02/16/2018    History of Present Illness Bobby Peterson  is a 77 y.o. male with a known history of chronic respiratory failure from COPD, on continuous 2 L oxygen at home. Pt was placed on BiPAP and admitted to hospital for further evaluation.    Clinical Impression   Pt. Presents with weakness, limited activity tolerance, and limited functional mobility which limits his ability to complete basic ADL and IADL functioning. Pt. Resides at home with his wife. Pt. Required was independent with ADLs, Pt.'s wife would assist him as needed. Pt.'s wife assisted pt. with meal preparation, medication management, and performing home management, and household tasks. Pt. reports he was able to prepare a light meal if needed. Pt. No longer drives. Pt. Education was provided about safety with ADL tasks, energy conservation. Pt. Could benefit from OT services for ADL training, A/E training, and pt. education about home modification, and DME. Pt. Plans to return home upon discharge with family to assist pt. as needed. Pt. Could benefit from follow-up [place] services upon discharge.    Follow Up Recommendations  Home health OT    Equipment Recommendations       Recommendations for Other Services       Precautions / Restrictions Precautions Precautions: Fall Restrictions Weight Bearing Restrictions: No      Mobility                   Transfers     Transfers: Sit to/from Stand;Stand Pivot Transfers Sit to Stand: Min assist Stand pivot transfers: Min assist            Balance                                           ADL either performed or assessed with clinical judgement   ADL Overall ADL's : Needs assistance/impaired Eating/Feeding: Independent;Set up   Grooming: Independent;Set up   Upper Body Bathing: Set up;Independent   Lower Body  Bathing: Set up;Minimal assistance   Upper Body Dressing : Set up;Independent   Lower Body Dressing: Set up;Moderate assistance               Functional mobility during ADLs: Minimal assistance       Vision Baseline Vision/History: (Low vision. Pt. used to wear glasses, however he stopped wearing them.) Patient Visual Report: No change from baseline(Pt. reports history of infection in eyes, was going for injections however stopped going.)       Perception     Praxis      Pertinent Vitals/Pain Pain Assessment: No/denies pain     Hand Dominance     Extremity/Trunk Assessment Upper Extremity Assessment Upper Extremity Assessment: Generalized weakness           Communication Communication Communication: No difficulties   Cognition Arousal/Alertness: Awake/alert Behavior During Therapy: WFL for tasks assessed/performed Overall Cognitive Status: Within Functional Limits for tasks assessed                                     General Comments       Exercises     Shoulder Instructions      Home Living Family/patient expects to be discharged to:: Private residence Living Arrangements: Spouse/significant  other Available Help at Discharge: Family Type of Home: House Home Access: Stairs to enter;Ramped entrance     Home Layout: One level               Home Equipment: Walker - standard;Cane - single point;Grab bars - tub/shower          Prior Functioning/Environment Level of Independence: Independent        Comments: Pt. reports indepndence with ADLS. Pt.s wife assisted with meal prep. and medication management. Pt. was able to perform light meal prep if needed. Pt. no longer drives. Pt. Uses 2LO2 at home.        OT Problem List: Decreased strength;Decreased knowledge of use of DME or AE;Decreased activity tolerance      OT Treatment/Interventions: Self-care/ADL training;Therapeutic exercise;DME and/or AE  instruction;Therapeutic activities    OT Goals(Current goals can be found in the care plan section) Acute Rehab OT Goals Patient Stated Goal: To return home OT Goal Formulation: With patient Potential to Achieve Goals: Good  OT Frequency: Min 1X/week   Barriers to D/C:            Co-evaluation              AM-PAC PT "6 Clicks" Daily Activity     Outcome Measure Help from another person eating meals?: None Help from another person taking care of personal grooming?: None Help from another person toileting, which includes using toliet, bedpan, or urinal?: A Little Help from another person bathing (including washing, rinsing, drying)?: A Little Help from another person to put on and taking off regular upper body clothing?: None Help from another person to put on and taking off regular lower body clothing?: A Lot 6 Click Score: 20   End of Session Equipment Utilized During Treatment: Gait belt  Activity Tolerance: Patient tolerated treatment well Patient left: in bed  OT Visit Diagnosis: Muscle weakness (generalized) (M62.81)                Time: 0935-1000 OT Time Calculation (min): 25 min Charges:  OT General Charges $OT Visit: 1 Visit OT Evaluation $OT Eval Moderate Complexity: 1 Mod G-Codes:     Harrel Carina, MS, OTR/L   Harrel Carina 02/16/2018, 12:04 PM

## 2018-02-16 NOTE — Telephone Encounter (Signed)
Pt is scheduled for hospital f/u on 02/22/18 and is being discharged today. Thanks TNP

## 2018-02-16 NOTE — Telephone Encounter (Signed)
FYI. KW 

## 2018-02-17 LAB — URINE CULTURE

## 2018-02-18 LAB — BLOOD CULTURE ID PANEL (REFLEXED)
ACINETOBACTER BAUMANNII: NOT DETECTED
CANDIDA ALBICANS: NOT DETECTED
CANDIDA KRUSEI: NOT DETECTED
CANDIDA PARAPSILOSIS: NOT DETECTED
CARBAPENEM RESISTANCE: NOT DETECTED
Candida glabrata: NOT DETECTED
Candida tropicalis: NOT DETECTED
ENTEROBACTER CLOACAE COMPLEX: NOT DETECTED
ENTEROBACTERIACEAE SPECIES: DETECTED — AB
ENTEROCOCCUS SPECIES: NOT DETECTED
Escherichia coli: NOT DETECTED
Haemophilus influenzae: NOT DETECTED
KLEBSIELLA OXYTOCA: NOT DETECTED
KLEBSIELLA PNEUMONIAE: NOT DETECTED
Listeria monocytogenes: NOT DETECTED
Neisseria meningitidis: NOT DETECTED
PSEUDOMONAS AERUGINOSA: NOT DETECTED
Proteus species: NOT DETECTED
STAPHYLOCOCCUS AUREUS BCID: NOT DETECTED
STREPTOCOCCUS PYOGENES: NOT DETECTED
STREPTOCOCCUS SPECIES: NOT DETECTED
Serratia marcescens: NOT DETECTED
Staphylococcus species: NOT DETECTED
Streptococcus agalactiae: NOT DETECTED
Streptococcus pneumoniae: NOT DETECTED

## 2018-02-18 NOTE — Progress Notes (Signed)
Patient ID: Bobby Peterson, male   DOB: [birth year], 77 y.o.   MRN: [id number] called by RN reporting one out of two blood culture anaerobic bottle growing Enterobacter species was discharged on Levaquin for four days.

## 2018-02-19 LAB — CULTURE, BLOOD (ROUTINE X 2)
CULTURE: NO GROWTH
Special Requests: ADEQUATE

## 2018-02-19 NOTE — Telephone Encounter (Signed)
Transition Care Management Follow-up Telephone Call  Date of discharge and from where: [facility] on 02/16/18  How have you been since you were released from the hospital? Per wife pt is doing ok. BS reading are ranging from 278 (higher at night)-97 (lower in AM. Pt is wearing his O2 at all times and not having any respiratory issues. Declines any new or worsening s/s.  Any questions or concerns? No   Items Reviewed:  Did the pt receive and understand the discharge instructions provided? Yes   Medications obtained and verified? No, wife to bring meds in at f/u apt.  Any new allergies since your discharge? Yes   Dietary orders reviewed? Yes  Do you have support at home? Yes   Other (ie: DME, Home Health, etc) N/A  Functional Questionnaire: (I = Independent and D = Dependent) ADL's: I  Bathing/Dressing- I   Meal Prep- I  Eating- I  Maintaining continence- I  Transferring/Ambulation- I  Managing Meds- D, wife manages meds.    Follow up appointments reviewed:    PCP Hospital f/u appt confirmed? Yes  Scheduled to see Carmon Ginsberg on 02/22/18 @ 2:00 PM.  [facility] f/u appt confirmed? N/A  Are transportation arrangements needed? No   If their condition worsens, is the pt aware to call  their PCP or go to the ED? Yes  Was the patient provided with contact information for the PCP's office or ED? Yes  Was the pt encouraged to call back with questions or concerns? Yes

## 2018-02-20 LAB — CULTURE, BLOOD (ROUTINE X 2): Special Requests: ADEQUATE

## 2018-02-22 ENCOUNTER — Encounter: Payer: Self-pay | Admitting: Family Medicine

## 2018-02-22 ENCOUNTER — Ambulatory Visit (INDEPENDENT_AMBULATORY_CARE_PROVIDER_SITE_OTHER): Payer: Medicare HMO | Admitting: Family Medicine

## 2018-02-22 VITALS — BP 100/58 | HR 79 | Temp 98.1°F | Resp 15 | Wt 167.6 lb

## 2018-02-22 DIAGNOSIS — N321 Vesicointestinal fistula: Secondary | ICD-10-CM | POA: Diagnosis not present

## 2018-02-22 DIAGNOSIS — N183 Chronic kidney disease, stage 3 unspecified: Secondary | ICD-10-CM

## 2018-02-22 DIAGNOSIS — R300 Dysuria: Secondary | ICD-10-CM

## 2018-02-22 DIAGNOSIS — J449 Chronic obstructive pulmonary disease, unspecified: Secondary | ICD-10-CM

## 2018-02-22 DIAGNOSIS — Z8701 Personal history of pneumonia (recurrent): Secondary | ICD-10-CM

## 2018-02-22 LAB — POCT URINALYSIS DIPSTICK
GLUCOSE UA: POSITIVE — AB
Ketones, UA: NEGATIVE
Nitrite, UA: NEGATIVE
Protein, UA: POSITIVE — AB
Spec Grav, UA: 1.015 (ref 1.010–1.025)
Urobilinogen, UA: 1 E.U./dL
pH, UA: 6 (ref 5.0–8.0)

## 2018-02-22 MED ORDER — CEPHALEXIN 500 MG PO CAPS: 500.0000 mg | ORAL_CAPSULE | Freq: Two times a day (BID) | ORAL | 0 refills | Status: DC

## 2018-02-22 NOTE — Patient Instructions (Signed)
Check fasting sugars twice a week and twice a week check  2 hours after eating. Get the follow up chest X-ray around August 15. We will call you with the lab results.

## 2018-02-22 NOTE — Progress Notes (Signed)
  Subjective:     Patient ID: Bobby Peterson, male   DOB: 06-02-1941, 77 y.o.   MRN: [id number] Chief Complaint  Patient presents with  . Hospitalization Follow-up    Patient comes in office today for follow up after being admitted to [facility] on 02/14/18 with complaints of shortness of breath and hypotension. It was determined at hospital that patient had sepsis likely secondary to pneumonia. acute respiratory failure secondary to pneumonia and COPD exaacerbation. Patient was prescribed 5 days worth of Levaquin and 4 days of Prednisone. Patient reports that he finished both medications but complains that he still dosent feel well today   HPI He is more concerned today about dysuria in the setting of chronic colovesical fistula and recurrent cystitis. He has opted not to have surgery and felt to be at risk for the same. Accompanied by his wife, [name], Home glucose readings after recently completing prednisone: 7/23-fasting 101; 7/24 fasting 92.  Review of Systems     Objective:   Physical Exam  Constitutional: He appears well-developed and well-nourished. No distress (on oxygen via n.c.).  Pulmonary/Chest:  Posterior expiratory wheeze bilaterally       Assessment:    1. Chronic obstructive pulmonary disease, unspecified COPD type ([place]): continue oxygen and inhalers   2. Chronic kidney disease (CKD), stage III (moderate) (HCC) - Renal function panel  3. History of pneumonia: f/u x-ray in 3 weeks - DG Chest 2 View; Future  4. Dysuria - POCT urinalysis dipstick - Urine Culture - cephALEXin (KEFLEX) 500 MG capsule; Take 1 capsule (500 mg total) by mouth 2 (two) times daily.  Dispense: 14 capsule; Refill: 0  5. Colovesical fistula - POCT urinalysis dipstick - cephALEXin (KEFLEX) 500 MG capsule; Take 1 capsule (500 mg total) by mouth 2 (two) times daily.  Dispense: 14 capsule; Refill: 0    Plan:    Check fasting and post prandial sugars twice a week. F/u CXR in 3 weeks. Further f/u  pending lab results.

## 2018-02-23 ENCOUNTER — Telehealth: Payer: Self-pay

## 2018-02-23 LAB — RENAL FUNCTION PANEL
ALBUMIN: 3.7 g/dL (ref 3.5–4.8)
BUN / CREAT RATIO: 14 (ref 10–24)
BUN: 25 mg/dL (ref 8–27)
CALCIUM: 9.2 mg/dL (ref 8.6–10.2)
CHLORIDE: 93 mmol/L — AB (ref 96–106)
CO2: 27 mmol/L (ref 20–29)
CREATININE: 1.8 mg/dL — AB (ref 0.76–1.27)
GFR calc Af Amer: 40 mL/min/{1.73_m2} — ABNORMAL LOW (ref >59)
GFR calc non Af Amer: 35 mL/min/{1.73_m2} — ABNORMAL LOW (ref >59)
Glucose: 101 mg/dL — ABNORMAL HIGH (ref 65–99)
PHOSPHORUS: 4.5 mg/dL (ref 2.5–4.5)
Potassium: 4 mmol/L (ref 3.5–5.2)
Sodium: 140 mmol/L (ref 134–144)

## 2018-02-23 NOTE — Telephone Encounter (Signed)
-----   Message from Carmon Ginsberg, [place] sent at 02/23/2018  7:26 AM EDT ----- Sugar was ok and kidney function is stable.

## 2018-02-23 NOTE — Telephone Encounter (Signed)
Patients wife has been advised. KW 

## 2018-02-24 LAB — URINE CULTURE

## 2018-02-26 ENCOUNTER — Telehealth: Payer: Self-pay | Admitting: Family Medicine

## 2018-02-26 ENCOUNTER — Telehealth: Payer: Self-pay

## 2018-02-26 NOTE — Telephone Encounter (Signed)
Patient has been advised. KW 

## 2018-02-26 NOTE — Telephone Encounter (Signed)
Pt's wife [name] is requesting call back to discuss the results that were given to pt earlier this morning. Please advise. Thanks TNP

## 2018-02-26 NOTE — Telephone Encounter (Signed)
-----   Message from Carmon Ginsberg, [place] sent at 02/26/2018  7:29 AM EDT ----- No specific organism found on culture-may stop the antibiotic.

## 2018-02-26 NOTE — Telephone Encounter (Signed)
Patients wife has been advised. KW 

## 2018-03-15 ENCOUNTER — Ambulatory Visit
Admission: RE | Admit: 2018-03-15 | Discharge: 2018-03-15 | Disposition: A | Discharge status: Home / Self Care | Payer: Medicare HMO | Source: Ambulatory Visit | Attending: Family Medicine | Admitting: Family Medicine

## 2018-03-15 DIAGNOSIS — Z8701 Personal history of pneumonia (recurrent): Secondary | ICD-10-CM | POA: Insufficient documentation

## 2018-03-15 DIAGNOSIS — J9811 Atelectasis: Secondary | ICD-10-CM | POA: Diagnosis not present

## 2018-03-15 DIAGNOSIS — I517 Cardiomegaly: Secondary | ICD-10-CM | POA: Insufficient documentation

## 2018-03-15 DIAGNOSIS — J449 Chronic obstructive pulmonary disease, unspecified: Secondary | ICD-10-CM | POA: Diagnosis not present

## 2018-03-15 DIAGNOSIS — R05 Cough: Secondary | ICD-10-CM | POA: Diagnosis not present

## 2018-03-16 DIAGNOSIS — J449 Chronic obstructive pulmonary disease, unspecified: Secondary | ICD-10-CM | POA: Diagnosis not present

## 2018-03-26 ENCOUNTER — Other Ambulatory Visit: Payer: Self-pay | Admitting: Family Medicine

## 2018-03-26 MED ORDER — NITROFURANTOIN MONOHYD MACRO 100 MG PO CAPS: 100.0000 mg | ORAL_CAPSULE | Freq: Every day | ORAL | 0 refills | Status: DC

## 2018-04-03 ENCOUNTER — Telehealth: Payer: Self-pay | Admitting: Family Medicine

## 2018-04-03 ENCOUNTER — Other Ambulatory Visit: Payer: Self-pay | Admitting: Family Medicine

## 2018-04-03 MED ORDER — PREDNISONE 20 MG PO TABS: ORAL_TABLET | ORAL | 0 refills | Status: DC

## 2018-04-03 NOTE — Telephone Encounter (Signed)
Mrs. Colmenares states that patients right foot has been red and swollen for the past two days and reports pain when walking, please advise if office visit is needed. KW

## 2018-04-03 NOTE — Telephone Encounter (Signed)
Patients wife has been advised. KW 

## 2018-04-03 NOTE — Telephone Encounter (Signed)
Prednisone sent in. If his foot is not improving in the next two days to come in the office.

## 2018-04-03 NOTE — Telephone Encounter (Signed)
Pt's wife called wanting a prescription of prednisone sent in for the gout in his foot.  She said Mikki Santee has done this before  [address]  CB#  [phone]

## 2018-04-15 DIAGNOSIS — J449 Chronic obstructive pulmonary disease, unspecified: Secondary | ICD-10-CM | POA: Diagnosis not present

## 2018-04-16 DIAGNOSIS — J449 Chronic obstructive pulmonary disease, unspecified: Secondary | ICD-10-CM | POA: Diagnosis not present

## 2018-04-30 ENCOUNTER — Ambulatory Visit (INDEPENDENT_AMBULATORY_CARE_PROVIDER_SITE_OTHER): Payer: Medicare HMO | Admitting: Family Medicine

## 2018-04-30 ENCOUNTER — Encounter: Payer: Self-pay | Admitting: Family Medicine

## 2018-04-30 VITALS — BP 102/64 | HR 113 | Temp 98.6°F | Resp 16 | Wt 173.4 lb

## 2018-04-30 DIAGNOSIS — N321 Vesicointestinal fistula: Secondary | ICD-10-CM | POA: Diagnosis not present

## 2018-04-30 DIAGNOSIS — R300 Dysuria: Secondary | ICD-10-CM | POA: Diagnosis not present

## 2018-04-30 MED ORDER — NITROFURANTOIN MONOHYD MACRO 100 MG PO CAPS: 100.0000 mg | ORAL_CAPSULE | Freq: Every day | ORAL | 1 refills | Status: DC

## 2018-04-30 NOTE — Progress Notes (Signed)
  Subjective:     Patient ID: Bobby Peterson, male   DOB: 01/01/1941, 77 y.o.   MRN: [id number] Chief Complaint  Patient presents with  . Follow-up    Patient returns to office today for follow up from 02/22/18, patient complains of dysuria still he states that he was on Nitrofurantin and states that he had good symptom control on medication.    HPI Wishes to stay on "whatever helps" as he is not a good candidate for surgery. Denies abdominal pain. Accompanied by his wife today.  Review of Systems     Objective:   Physical Exam  Constitutional: He appears well-developed and well-nourished. No distress.  Pulmonary/Chest:  Diminished breath sounds otherwise clear.  Genitourinary:  Genitourinary Comments: Unable to provide a urine sample today.       Assessment:    1. Colovesical fistula  2. Dysuria: resume nitrofurantoin    Plan:    Has urine container and will obtain sample at home as needed for flare of urinary symptoms.

## 2018-04-30 NOTE — Patient Instructions (Signed)
Return if your bladder symptoms flare up so we can check your urine.

## 2018-05-14 ENCOUNTER — Encounter: Payer: Self-pay | Admitting: Family Medicine

## 2018-05-14 ENCOUNTER — Ambulatory Visit (INDEPENDENT_AMBULATORY_CARE_PROVIDER_SITE_OTHER): Payer: Medicare HMO | Admitting: Family Medicine

## 2018-05-14 VITALS — BP 106/60 | HR 112 | Temp 97.8°F | Resp 16 | Wt 166.8 lb

## 2018-05-14 DIAGNOSIS — N3090 Cystitis, unspecified without hematuria: Secondary | ICD-10-CM

## 2018-05-14 DIAGNOSIS — J441 Chronic obstructive pulmonary disease with (acute) exacerbation: Secondary | ICD-10-CM

## 2018-05-14 DIAGNOSIS — J432 Centrilobular emphysema: Secondary | ICD-10-CM | POA: Diagnosis not present

## 2018-05-14 LAB — POCT URINALYSIS DIPSTICK
Glucose, UA: NEGATIVE
Nitrite, UA: NEGATIVE
Protein, UA: POSITIVE — AB
Spec Grav, UA: 1.015 (ref 1.010–1.025)
Urobilinogen, UA: 4 E.U./dL — AB
pH, UA: 6.5 (ref 5.0–8.0)

## 2018-05-14 MED ORDER — PREDNISONE 20 MG PO TABS: ORAL_TABLET | ORAL | 1 refills | Status: DC

## 2018-05-14 MED ORDER — DOXYCYCLINE HYCLATE 100 MG PO TABS: 100.0000 mg | ORAL_TABLET | Freq: Two times a day (BID) | ORAL | 0 refills | Status: DC

## 2018-05-14 MED ORDER — ALBUTEROL SULFATE HFA 108 (90 BASE) MCG/ACT IN AERS: 2.0000 | INHALATION_SPRAY | RESPIRATORY_TRACT | 5 refills | Status: DC | PRN

## 2018-05-14 NOTE — Patient Instructions (Signed)
Stop nitrofurantoin for now. Schedule your albuterol inhaler 2 puffs every 4-6 hours We will cal with the urine culture report.

## 2018-05-14 NOTE — Progress Notes (Signed)
  Subjective:     Patient ID: Bobby Peterson, male   DOB: 1941-07-13, 77 y.o.   MRN: [id number] Chief Complaint  Patient presents with  . Dysuria    Patient comes back in office office today for follow up  from 04/30/18, patient states that he has been taking Nitrofurantoin but symptoms of dysuria has not improved  . Cough    Patient reports cough for two and half weeks that has been productive, he has been taking otc Robitussin DM.    HPI Reports increased cough with production of thick sputum-yellow in the office today. Has been compliant with Advair but not sure they have the rescue inhaler. Accompanied by his wife today.Hx of colovesical fistula.  Review of Systems     Objective:   Physical Exam  Constitutional: He appears well-developed and well-nourished. Distressed:  mild distress with deep congested, produtive cough.  Cardiovascular: Regular rhythm. Tachycardia present.  Pulmonary/Chest: He has wheezes (bilateral posterior expiratory).  Musculoskeletal: He exhibits no edema (of distal lower extremities).  Psychiatric:  Irritable towards his wife as he does not like coming in to the doctor's office.       Assessment:    1. Cystitis: start doxycycline - Urine Culture - POCT urinalysis dipstick  2. Chronic obstructive pulmonary disease with acute exacerbation ([place]): doxycycline as above with prednisone  3. Centrilobular emphysema (HCC) - albuterol (PROVENTIL HFA;VENTOLIN HFA) 108 (90 Base) MCG/ACT inhaler; Inhale 2 puffs into the lungs every 4 (four) hours as needed for wheezing or shortness of breath.  Dispense: 1 Inhaler; Refill: 5    Plan:    Further f/u pending urine culture. Hold nitrofurantoin.

## 2018-05-15 DIAGNOSIS — J449 Chronic obstructive pulmonary disease, unspecified: Secondary | ICD-10-CM | POA: Diagnosis not present

## 2018-05-16 DIAGNOSIS — J449 Chronic obstructive pulmonary disease, unspecified: Secondary | ICD-10-CM | POA: Diagnosis not present

## 2018-05-16 LAB — URINE CULTURE

## 2018-05-17 ENCOUNTER — Telehealth: Payer: Self-pay

## 2018-05-17 NOTE — Telephone Encounter (Signed)
Patient was advised and states that he is starting to feel better. KW

## 2018-05-17 NOTE — Telephone Encounter (Signed)
-----   Message from Carmon Ginsberg, [place] sent at 05/17/2018  7:30 AM EDT ----- No specific organism detected. Are you feeling better on the current antibiotic?

## 2018-06-15 DIAGNOSIS — J449 Chronic obstructive pulmonary disease, unspecified: Secondary | ICD-10-CM | POA: Diagnosis not present

## 2018-06-16 DIAGNOSIS — J449 Chronic obstructive pulmonary disease, unspecified: Secondary | ICD-10-CM | POA: Diagnosis not present

## 2018-06-27 IMAGING — DX DG CHEST 1V
1 series · 1 of 1 positions shown · non-contrast
Comparison: 04/21/2016.

CLINICAL DATA: Pneumonia.

EXAM:
CHEST 1 VIEW

[chest ap]
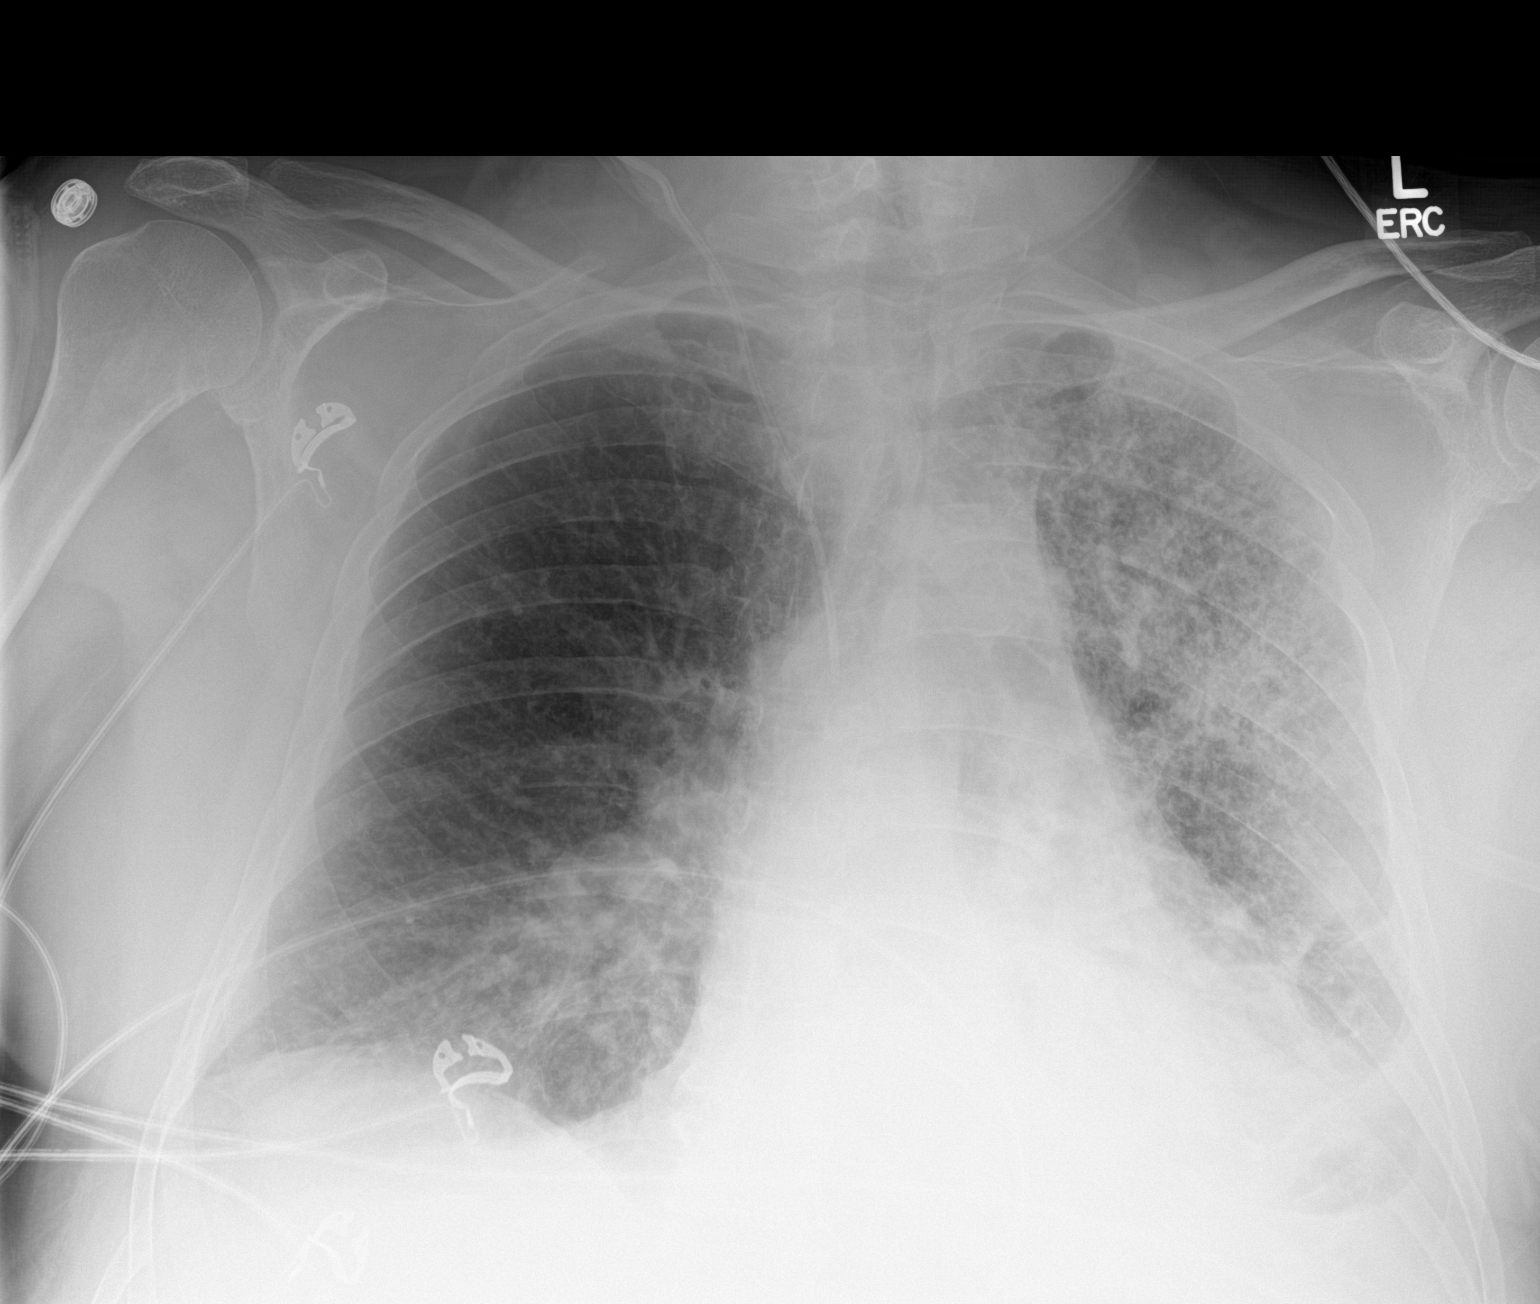

[1 of 1 positions shown; findings below may reference images not displayed]

FINDINGS: Right jugular catheter tip in the superior vena cava. The
endotracheal and nasogastric tubes have been removed. The heart
remains grossly normal in size. Mild increase in diffuse prominence
of the interstitial markings. No significant change in airspace
opacity throughout the left lung. Interval small left pleural
effusion. Diffuse osteopenia.
IMPRESSION: 1. Stable left lung pneumonia.
2. Interval small left pleural effusion.
3. Interval mild interstitial pulmonary edema.

## 2018-06-28 IMAGING — DX DG CHEST 1V PORT
1 series · 1 of 1 positions shown · non-contrast
Comparison: Chest radiograph dated 04/24/2016

CLINICAL DATA: 75-year-old male with central line placement

EXAM:
PORTABLE CHEST 1 VIEW

[chest ap]
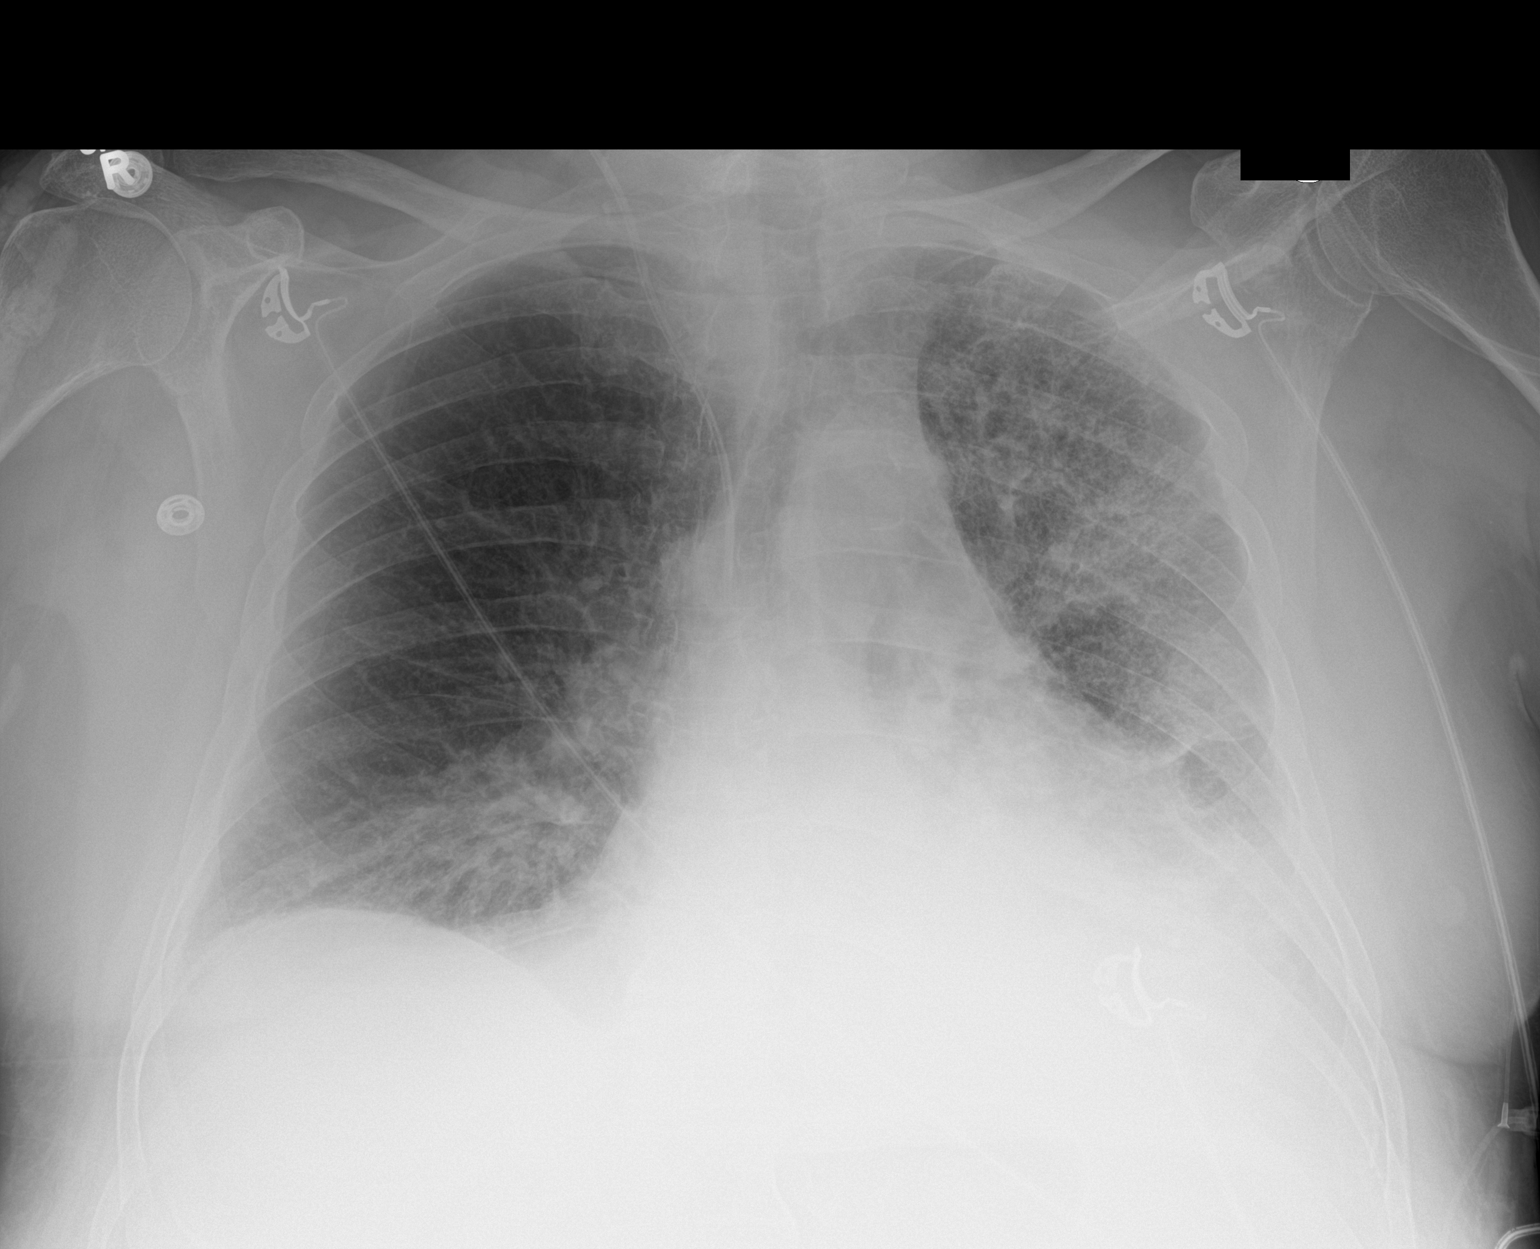

[1 of 1 positions shown; findings below may reference images not displayed]

FINDINGS: Right IJ central line with tip over central SVC is stable
positioning. There is no pneumothorax. Diffuse left lung
interstitial and nodular opacities as well as small left pleural
effusion similar to prior radiograph. Right lung base atelectatic
changes versus infiltrate. There is stable cardiac silhouette. No
acute osseous pathology.
IMPRESSION: Right IJ central line with tip over central SVC in stable
positioning. No pneumothorax.

Stable appearance of the left lung infiltrate and left pleural
effusion. Follow-up recommended.

## 2018-07-15 DIAGNOSIS — J449 Chronic obstructive pulmonary disease, unspecified: Secondary | ICD-10-CM | POA: Diagnosis not present

## 2018-07-16 DIAGNOSIS — J449 Chronic obstructive pulmonary disease, unspecified: Secondary | ICD-10-CM | POA: Diagnosis not present

## 2018-07-18 ENCOUNTER — Telehealth: Payer: Self-pay | Admitting: Family Medicine

## 2018-07-18 ENCOUNTER — Other Ambulatory Visit: Payer: Self-pay | Admitting: Family Medicine

## 2018-07-18 MED ORDER — PREDNISONE 20 MG PO TABS
ORAL_TABLET | ORAL | 1 refills | Status: DC
Start: 2018-07-18 — End: 2018-08-02

## 2018-07-18 NOTE — Telephone Encounter (Signed)
Pt's wife calling for pt needing prednisone for gout in foot.  Please advise.  Thanks, [employer]

## 2018-07-18 NOTE — Telephone Encounter (Signed)
I have sent in a 5 day course like before.

## 2018-07-19 NOTE — Telephone Encounter (Signed)
Pt's wife advised of rx sent to pharmacy.  dbs

## 2018-07-30 ENCOUNTER — Ambulatory Visit: Payer: Self-pay | Admitting: Family Medicine

## 2018-08-02 ENCOUNTER — Other Ambulatory Visit: Payer: Self-pay

## 2018-08-02 ENCOUNTER — Encounter: Payer: Self-pay | Admitting: Emergency Medicine

## 2018-08-02 ENCOUNTER — Inpatient Hospital Stay
Admission: EM | Admit: 2018-08-02 | Discharge: 2018-08-06 | DRG: 871 | Disposition: A | Discharge status: Home / Self Care | Payer: Medicare HMO | Attending: Internal Medicine | Admitting: Internal Medicine

## 2018-08-02 ENCOUNTER — Emergency Department: Payer: Medicare HMO

## 2018-08-02 DIAGNOSIS — J189 Pneumonia, unspecified organism: Secondary | ICD-10-CM | POA: Diagnosis not present

## 2018-08-02 DIAGNOSIS — N183 Chronic kidney disease, stage 3 (moderate): Secondary | ICD-10-CM | POA: Diagnosis present

## 2018-08-02 DIAGNOSIS — I499 Cardiac arrhythmia, unspecified: Secondary | ICD-10-CM | POA: Diagnosis not present

## 2018-08-02 DIAGNOSIS — Z515 Encounter for palliative care: Secondary | ICD-10-CM | POA: Diagnosis not present

## 2018-08-02 DIAGNOSIS — R918 Other nonspecific abnormal finding of lung field: Secondary | ICD-10-CM | POA: Diagnosis not present

## 2018-08-02 DIAGNOSIS — R000 Tachycardia, unspecified: Secondary | ICD-10-CM | POA: Diagnosis not present

## 2018-08-02 DIAGNOSIS — Z888 Allergy status to other drugs, medicaments and biological substances status: Secondary | ICD-10-CM

## 2018-08-02 DIAGNOSIS — E7800 Pure hypercholesterolemia, unspecified: Secondary | ICD-10-CM | POA: Diagnosis present

## 2018-08-02 DIAGNOSIS — Z85528 Personal history of other malignant neoplasm of kidney: Secondary | ICD-10-CM | POA: Diagnosis not present

## 2018-08-02 DIAGNOSIS — E785 Hyperlipidemia, unspecified: Secondary | ICD-10-CM | POA: Diagnosis present

## 2018-08-02 DIAGNOSIS — J181 Lobar pneumonia, unspecified organism: Secondary | ICD-10-CM

## 2018-08-02 DIAGNOSIS — N400 Enlarged prostate without lower urinary tract symptoms: Secondary | ICD-10-CM | POA: Diagnosis present

## 2018-08-02 DIAGNOSIS — F17220 Nicotine dependence, chewing tobacco, uncomplicated: Secondary | ICD-10-CM | POA: Diagnosis present

## 2018-08-02 DIAGNOSIS — Z7982 Long term (current) use of aspirin: Secondary | ICD-10-CM | POA: Diagnosis not present

## 2018-08-02 DIAGNOSIS — I2510 Atherosclerotic heart disease of native coronary artery without angina pectoris: Secondary | ICD-10-CM | POA: Diagnosis present

## 2018-08-02 DIAGNOSIS — Z8249 Family history of ischemic heart disease and other diseases of the circulatory system: Secondary | ICD-10-CM | POA: Diagnosis not present

## 2018-08-02 DIAGNOSIS — J440 Chronic obstructive pulmonary disease with acute lower respiratory infection: Secondary | ICD-10-CM | POA: Diagnosis present

## 2018-08-02 DIAGNOSIS — Z905 Acquired absence of kidney: Secondary | ICD-10-CM | POA: Diagnosis not present

## 2018-08-02 DIAGNOSIS — J449 Chronic obstructive pulmonary disease, unspecified: Secondary | ICD-10-CM | POA: Diagnosis not present

## 2018-08-02 DIAGNOSIS — Z66 Do not resuscitate: Secondary | ICD-10-CM | POA: Diagnosis not present

## 2018-08-02 DIAGNOSIS — J9621 Acute and chronic respiratory failure with hypoxia: Secondary | ICD-10-CM | POA: Diagnosis present

## 2018-08-02 DIAGNOSIS — I5032 Chronic diastolic (congestive) heart failure: Secondary | ICD-10-CM | POA: Diagnosis present

## 2018-08-02 DIAGNOSIS — I1310 Hypertensive heart and chronic kidney disease without heart failure, with stage 1 through stage 4 chronic kidney disease, or unspecified chronic kidney disease: Secondary | ICD-10-CM | POA: Diagnosis not present

## 2018-08-02 DIAGNOSIS — R0602 Shortness of breath: Secondary | ICD-10-CM | POA: Diagnosis not present

## 2018-08-02 DIAGNOSIS — J9620 Acute and chronic respiratory failure, unspecified whether with hypoxia or hypercapnia: Secondary | ICD-10-CM | POA: Diagnosis not present

## 2018-08-02 DIAGNOSIS — I4891 Unspecified atrial fibrillation: Secondary | ICD-10-CM | POA: Diagnosis not present

## 2018-08-02 DIAGNOSIS — Z9981 Dependence on supplemental oxygen: Secondary | ICD-10-CM | POA: Diagnosis not present

## 2018-08-02 DIAGNOSIS — A419 Sepsis, unspecified organism: Secondary | ICD-10-CM | POA: Diagnosis not present

## 2018-08-02 DIAGNOSIS — K219 Gastro-esophageal reflux disease without esophagitis: Secondary | ICD-10-CM | POA: Diagnosis present

## 2018-08-02 DIAGNOSIS — R7881 Bacteremia: Secondary | ICD-10-CM | POA: Diagnosis not present

## 2018-08-02 DIAGNOSIS — A403 Sepsis due to Streptococcus pneumoniae: Principal | ICD-10-CM | POA: Diagnosis present

## 2018-08-02 DIAGNOSIS — E872 Acidosis, unspecified: Secondary | ICD-10-CM

## 2018-08-02 DIAGNOSIS — Z79899 Other long term (current) drug therapy: Secondary | ICD-10-CM

## 2018-08-02 DIAGNOSIS — I480 Paroxysmal atrial fibrillation: Secondary | ICD-10-CM | POA: Diagnosis not present

## 2018-08-02 LAB — CBC WITH DIFFERENTIAL/PLATELET
Abs Immature Granulocytes: 0.37 10*3/uL — ABNORMAL HIGH (ref 0.00–0.07)
Basophils Absolute: 0.1 10*3/uL (ref 0.0–0.1)
Basophils Relative: 0 %
EOS ABS: 0 10*3/uL (ref 0.0–0.5)
EOS PCT: 0 %
HCT: 41.6 % (ref 39.0–52.0)
Hemoglobin: 13.1 g/dL (ref 13.0–17.0)
Immature Granulocytes: 1 %
Lymphocytes Relative: 3 %
Lymphs Abs: 0.8 10*3/uL (ref 0.7–4.0)
MCH: 27.2 pg (ref 26.0–34.0)
MCHC: 31.5 g/dL (ref 30.0–36.0)
MCV: 86.3 fL (ref 80.0–100.0)
MONO ABS: 0.8 10*3/uL (ref 0.1–1.0)
Monocytes Relative: 3 %
Neutro Abs: 26.1 10*3/uL — ABNORMAL HIGH (ref 1.7–7.7)
Neutrophils Relative %: 93 %
Platelets: 269 10*3/uL (ref 150–400)
RBC: 4.82 MIL/uL (ref 4.22–5.81)
RDW: 16.2 % — AB (ref 11.5–15.5)
WBC: 28.1 10*3/uL — ABNORMAL HIGH (ref 4.0–10.5)
nRBC: 0 % (ref 0.0–0.2)

## 2018-08-02 LAB — BLOOD GAS, VENOUS
Acid-Base Excess: 7 mmol/L — ABNORMAL HIGH (ref 0.0–2.0)
Bicarbonate: 33.4 mmol/L — ABNORMAL HIGH (ref 20.0–28.0)
O2 Saturation: 59.5 %
PATIENT TEMPERATURE: 37
pCO2, Ven: 54 mmHg (ref 44.0–60.0)
pH, Ven: 7.4 (ref 7.250–7.430)
pO2, Ven: 31 mmHg — CL (ref 32.0–45.0)

## 2018-08-02 LAB — BASIC METABOLIC PANEL
Anion gap: 12 (ref 5–15)
BUN: 24 mg/dL — ABNORMAL HIGH (ref 8–23)
CO2: 29 mmol/L (ref 22–32)
Calcium: 8.3 mg/dL — ABNORMAL LOW (ref 8.9–10.3)
Chloride: 91 mmol/L — ABNORMAL LOW (ref 98–111)
Creatinine, Ser: 1.82 mg/dL — ABNORMAL HIGH (ref 0.61–1.24)
GFR calc Af Amer: 40 mL/min — ABNORMAL LOW (ref >60)
GFR calc non Af Amer: 35 mL/min — ABNORMAL LOW (ref >60)
Glucose, Bld: 249 mg/dL — ABNORMAL HIGH (ref 70–99)
Potassium: 3.2 mmol/L — ABNORMAL LOW (ref 3.5–5.1)
Sodium: 132 mmol/L — ABNORMAL LOW (ref 135–145)

## 2018-08-02 LAB — CG4 I-STAT (LACTIC ACID): Lactic Acid, Venous: 2.39 mmol/L (ref 0.5–1.9)

## 2018-08-02 LAB — TROPONIN I: Troponin I: 0.03 ng/mL (ref <0.03)

## 2018-08-02 LAB — MRSA PCR SCREENING: MRSA by PCR: NEGATIVE

## 2018-08-02 LAB — LACTIC ACID, PLASMA: Lactic Acid, Venous: 3.5 mmol/L (ref 0.5–1.9)

## 2018-08-02 LAB — BRAIN NATRIURETIC PEPTIDE: B Natriuretic Peptide: 217 pg/mL — ABNORMAL HIGH (ref 0.0–100.0)

## 2018-08-02 MED ORDER — POTASSIUM CHLORIDE CRYS ER 20 MEQ PO TBCR
40.0000 meq | EXTENDED_RELEASE_TABLET | Freq: Once | ORAL | Status: AC
  Administered 2018-08-02: 40 meq via ORAL
  Filled 2018-08-02: qty 2

## 2018-08-02 MED ORDER — SODIUM CHLORIDE 0.9 % IV BOLUS
1000.0000 mL | Freq: Once | INTRAVENOUS | Status: AC
  Administered 2018-08-02: 1000 mL via INTRAVENOUS

## 2018-08-02 MED ORDER — IPRATROPIUM-ALBUTEROL 0.5-2.5 (3) MG/3ML IN SOLN
3.0000 mL | Freq: Four times a day (QID) | RESPIRATORY_TRACT | Status: DC
  Administered 2018-08-02 – 2018-08-04 (×8): 3 mL via RESPIRATORY_TRACT
  Filled 2018-08-02 (×9): qty 3

## 2018-08-02 MED ORDER — SENNOSIDES-DOCUSATE SODIUM 8.6-50 MG PO TABS
1.0000 | ORAL_TABLET | Freq: Every evening | ORAL | Status: DC | PRN
  Filled 2018-08-02: qty 1

## 2018-08-02 MED ORDER — METOPROLOL SUCCINATE ER 100 MG PO TB24
100.0000 mg | ORAL_TABLET | Freq: Every day | ORAL | Status: DC
  Administered 2018-08-03: 100 mg via ORAL
  Filled 2018-08-02: qty 4

## 2018-08-02 MED ORDER — PIPERACILLIN-TAZOBACTAM 3.375 G IVPB
3.3750 g | Freq: Three times a day (TID) | INTRAVENOUS | Status: DC
  Administered 2018-08-02 – 2018-08-03 (×3): 3.375 g via INTRAVENOUS
  Filled 2018-08-02 (×6): qty 50

## 2018-08-02 MED ORDER — ENOXAPARIN SODIUM 40 MG/0.4ML ~~LOC~~ SOLN
40.0000 mg | SUBCUTANEOUS | Status: DC
  Administered 2018-08-02 – 2018-08-04 (×3): 40 mg via SUBCUTANEOUS
  Filled 2018-08-02 (×4): qty 0.4

## 2018-08-02 MED ORDER — ONDANSETRON HCL 4 MG PO TABS
4.0000 mg | ORAL_TABLET | Freq: Four times a day (QID) | ORAL | Status: DC | PRN
  Filled 2018-08-02: qty 1

## 2018-08-02 MED ORDER — ACETAMINOPHEN 650 MG RE SUPP
650.0000 mg | Freq: Four times a day (QID) | RECTAL | Status: DC | PRN
  Filled 2018-08-02: qty 1

## 2018-08-02 MED ORDER — PIPERACILLIN-TAZOBACTAM 3.375 G IVPB 30 MIN
3.3750 g | Freq: Once | INTRAVENOUS | Status: AC
  Administered 2018-08-02: 3.375 g via INTRAVENOUS
  Filled 2018-08-02: qty 50

## 2018-08-02 MED ORDER — PRAVASTATIN SODIUM 20 MG PO TABS
20.0000 mg | ORAL_TABLET | Freq: Every day | ORAL | Status: DC
  Administered 2018-08-02 – 2018-08-06 (×5): 20 mg via ORAL
  Filled 2018-08-02 (×6): qty 1

## 2018-08-02 MED ORDER — ASPIRIN EC 81 MG PO TBEC
81.0000 mg | DELAYED_RELEASE_TABLET | Freq: Every day | ORAL | Status: DC
  Administered 2018-08-02 – 2018-08-06 (×5): 81 mg via ORAL
  Filled 2018-08-02 (×6): qty 1

## 2018-08-02 MED ORDER — ONDANSETRON HCL 4 MG/2ML IJ SOLN: 4.0000 mg | Freq: Four times a day (QID) | INTRAMUSCULAR | Status: DC | PRN

## 2018-08-02 MED ORDER — TAMSULOSIN HCL 0.4 MG PO CAPS
0.4000 mg | ORAL_CAPSULE | Freq: Every day | ORAL | Status: DC
  Administered 2018-08-02 – 2018-08-06 (×5): 0.4 mg via ORAL
  Filled 2018-08-02 (×7): qty 1

## 2018-08-02 MED ORDER — VANCOMYCIN HCL IN DEXTROSE 1-5 GM/200ML-% IV SOLN
1000.0000 mg | Freq: Once | INTRAVENOUS | Status: AC
  Administered 2018-08-02: 1000 mg via INTRAVENOUS
  Filled 2018-08-02: qty 200

## 2018-08-02 MED ORDER — VITAMIN B-12 1000 MCG PO TABS: 1000.0000 ug | ORAL_TABLET | ORAL | Status: DC

## 2018-08-02 MED ORDER — MORPHINE SULFATE (PF) 2 MG/ML IV SOLN
2.0000 mg | Freq: Once | INTRAVENOUS | Status: AC
  Administered 2018-08-02: 2 mg via INTRAVENOUS
  Filled 2018-08-02: qty 1

## 2018-08-02 MED ORDER — ACETAMINOPHEN 325 MG PO TABS
650.0000 mg | ORAL_TABLET | Freq: Four times a day (QID) | ORAL | Status: DC | PRN
  Administered 2018-08-03: 650 mg via ORAL
  Filled 2018-08-02 (×3): qty 2

## 2018-08-02 MED ORDER — ACETAMINOPHEN 500 MG PO TABS
1000.0000 mg | ORAL_TABLET | Freq: Once | ORAL | Status: AC
  Administered 2018-08-02: 1000 mg via ORAL

## 2018-08-02 MED ORDER — ACETAMINOPHEN 500 MG PO TABS
ORAL_TABLET | ORAL | Status: AC
  Filled 2018-08-02: qty 2

## 2018-08-02 MED ORDER — SODIUM CHLORIDE 0.9 % IV SOLN
INTRAVENOUS | Status: DC
  Administered 2018-08-02: 19:00:00 via INTRAVENOUS

## 2018-08-02 MED ORDER — GUAIFENESIN-DM 100-10 MG/5ML PO SYRP
5.0000 mL | ORAL_SOLUTION | ORAL | Status: DC | PRN
  Administered 2018-08-04: 5 mL via ORAL
  Filled 2018-08-02 (×2): qty 5

## 2018-08-02 MED ORDER — PANTOPRAZOLE SODIUM 40 MG PO TBEC
40.0000 mg | DELAYED_RELEASE_TABLET | Freq: Every day | ORAL | Status: DC
  Administered 2018-08-02 – 2018-08-06 (×5): 40 mg via ORAL
  Filled 2018-08-02 (×5): qty 1

## 2018-08-02 MED ORDER — VANCOMYCIN HCL IN DEXTROSE 750-5 MG/150ML-% IV SOLN
750.0000 mg | INTRAVENOUS | Status: DC
  Filled 2018-08-02 (×2): qty 150

## 2018-08-02 NOTE — ED Notes (Signed)
Patient  In bed resting with no distress.

## 2018-08-02 NOTE — ED Triage Notes (Signed)
PT from home with c/o worsening SOB x1wk. PT on 3L , family states o2 sats were in the 80s at home. PT appears fatigue and pursed lip breathing.

## 2018-08-02 NOTE — ED Notes (Signed)
Patient resting in bed. No sign or symptoms of distress at this time.

## 2018-08-02 NOTE — H&P (Signed)
[facility] [place] NAME: Bobby Peterson    MR#:  [id number]  DATE OF BIRTH:  02-10-1941  DATE OF ADMISSION:  08/02/2018  PRIMARY CARE PHYSICIAN: Carmon Ginsberg, PA   REQUESTING/REFERRING PHYSICIAN:   CHIEF COMPLAINT:   Chief Complaint  Patient presents with  . Shortness of Breath    HISTORY OF PRESENT ILLNESS: Bobby Peterson  is a 78 y.o. male with a known history of COPD on oxygen via nasal cannula at 2 L, chronic diastolic heart failure, prostate hypertrophy, coronary artery disease, hyperlipidemia, hypertension, renal cancer, ex-smoker presented to the emergency room for shortness of breath and cough.  Patient has cough congestion fever and cough productive of phlegm since Saturday.  He was evaluated in the emergency room he has pneumonia in the lung and started on broad-spectrum antibiotics.  WBC count is 28,000.  Hospitalist service was consulted for further care.  PAST MEDICAL HISTORY:   Past Medical History:  Diagnosis Date  . Acute respiratory failure with hypoxia ([place]) 12/03/2013   Overview:  Overview:  2 L O2 continuously  Last Assessment & Plan:  Continue 2 L O2 continuously   . Atrial tachycardia ([place]) 04/21/2016  . 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 BP (high blood pressure) 07/08/2015  . Chronic diastolic CHF (congestive heart failure) ([place])   . Chronic diastolic heart failure ([name]) 07/08/2015  . Chronic hypoxemic respiratory failure ([place]) 12/03/2013   2 L O2 continuously   . Chronic respiratory failure ([facility])    a. on home O2  . COPD (chronic obstructive pulmonary disease) ([place])   . COPD (chronic obstructive pulmonary disease) ([place]) 12/03/2013   May fifth 2015 simple spirometry>> ratio 45%, FEV1 0.95 L (34% per day)   . Coronary artery disease   . Coronary atherosclerosis 07/08/2015   Dr. Ubaldo Glassing   . Esophageal reflux 07/08/2015  . Hernia, umbilical   . Hypercholesteremia   . Hypertension   . Obesity   . Renal cancer  ([place])    a. s/p nephrectomy in 1980's  . Smoking 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 pack years 05/10/2016    PAST SURGICAL HISTORY:  Past Surgical History:  Procedure Laterality Date  . CHOLECYSTECTOMY N/A 12/22/2014   Procedure: LAPAROSCOPIC CHOLECYSTECTOMY WITH INTRAOPERATIVE CHOLANGIOGRAM;  Surgeon: Christene Lye, MD;  Location: [facility] ORS;  Service: General;  Laterality: N/A;  . COLONOSCOPY    . ERCP N/A 01/05/2015   Procedure: ENDOSCOPIC RETROGRADE CHOLANGIOPANCREATOGRAPHY (ERCP);  Surgeon: Hulen Luster, MD;  Location: [facility] ENDOSCOPY;  Service: Endoscopy;  Laterality: N/A;  . ERCP N/A 02/19/2015   Procedure: ENDOSCOPIC RETROGRADE CHOLANGIOPANCREATOGRAPHY (ERCP);  Surgeon: Hulen Luster, MD;  Location: [facility] ENDOSCOPY;  Service: Gastroenterology;  Laterality: N/A;  . GALLBLADDER SURGERY  11/2014  . NEPHRECTOMY  1990  . OMENTECTOMY  12/22/2014   Procedure: OMENTECTOMY;  Surgeon: Christene Lye, MD;  Location: [facility] ORS;  Service: General;;  Partial omentectomy  . STENT REMOVAL    . UMBILICAL HERNIA REPAIR N/A 12/22/2014   Procedure: HERNIA REPAIR UMBILICAL ADULT;  Surgeon: Christene Lye, MD;  Location: [facility] ORS;  Service: General;  Laterality: N/A;    SOCIAL HISTORY:  Social History   Tobacco Use  . Smoking status: Former Smoker    Packs/day: 1.00    Years: 50.00    Pack years: 50.00    Types: Cigarettes    Last attempt to quit: 12/04/2003    Years since quitting: 14.6  . Smokeless tobacco: Current User  Types: Chew  . Tobacco comment: Has been chewing tobacco since quitting smoking in 2005.  Substance Use Topics  . Alcohol use: No    FAMILY HISTORY:  Family History  Problem Relation Age of Onset  . Hypertension Mother     DRUG ALLERGIES:  Allergies  Allergen Reactions  . Fenofibrate     Other reaction(s): Headache  . Lisinopril Cough  . Niacin     Other reaction(s): Dizziness  . Phenergan [Promethazine Hcl]     NVD  . Simvastatin     Other reaction(s): Headache     REVIEW OF SYSTEMS:   CONSTITUTIONAL: No fever,has fatigue and weakness.  EYES: No blurred or double vision.  EARS, NOSE, AND THROAT: No tinnitus or ear pain.  RESPIRATORY: Has cough, shortness of breath,  No wheezing or hemoptysis.  CARDIOVASCULAR: No chest pain, orthopnea, edema.  GASTROINTESTINAL: No nausea, vomiting, diarrhea or abdominal pain.  GENITOURINARY: No dysuria, hematuria.  ENDOCRINE: No polyuria, nocturia,  HEMATOLOGY: No anemia, easy bruising or bleeding SKIN: No rash or lesion. MUSCULOSKELETAL: No joint pain or arthritis.   NEUROLOGIC: No tingling, numbness, weakness.  PSYCHIATRY: No anxiety or depression.   MEDICATIONS AT HOME:  Prior to Admission medications   Medication Sig Start Date End Date Taking? Authorizing Provider  albuterol (PROVENTIL HFA;VENTOLIN HFA) 108 (90 Base) MCG/ACT inhaler Inhale 2 puffs into the lungs every 4 (four) hours as needed for wheezing or shortness of breath. 05/14/18  Yes Carmon Ginsberg, PA  aspirin 81 MG tablet Take 81 mg by mouth daily.   Yes [provider]  Fluticasone-Salmeterol (ADVAIR) 250-50 MCG/DOSE AEPB INHALE 1 PUFF TWICE DAILY 01/02/18  Yes Carmon Ginsberg, PA  furosemide (LASIX) 40 MG tablet TAKE 1 TABLET EVERY DAY 10/05/17  Yes Carmon Ginsberg, PA  gabapentin (NEURONTIN) 300 MG capsule TAKE 1 CAPSULE TWICE DAILY 05/12/17  Yes Carmon Ginsberg, PA  guaiFENesin-dextromethorphan (ROBITUSSIN DM) 100-10 MG/5ML syrup Take 5 mLs by mouth every 4 (four) hours as needed for cough. 02/16/18  Yes Demetrios Loll, MD  metoprolol succinate (TOPROL-XL) 100 MG 24 hr tablet TAKE 1 TABLET DAILY WITH OR IMMEDIATELY FOLLOWING A MEAL. 10/05/17  Yes Carmon Ginsberg, PA  omeprazole (PRILOSEC) 40 MG capsule TAKE 1 CAPSULE DAILY BEFORE BREAKFAST. 05/12/17  Yes Chauvin, Herbie [place], PA  pravastatin (PRAVACHOL) 20 MG tablet TAKE 1 TABLET EVERY DAY 05/12/17  Yes Carmon Ginsberg, PA  tamsulosin (FLOMAX) 0.4 MG CAPS capsule TAKE 1 CAPSULE EVERY DAY 05/12/17   Yes Carmon Ginsberg, PA  vitamin B-12 (CYANOCOBALAMIN) 1000 MCG tablet Take 1,000 mcg by mouth once a week. *Take on Monday*   Yes [provider]  OXYGEN Inhale 2 L/min into the lungs continuous.     [provider]      PHYSICAL EXAMINATION:   VITAL SIGNS: Blood pressure 109/68, pulse (!) 140, temperature 98.3 F (36.8 C), temperature source Oral, resp. rate (!) 34, SpO2 92 %.  GENERAL:  78 y.o.-year-old patient lying in the bed with no acute distress.  EYES: Pupils equal, round, reactive to light and accommodation. No scleral icterus. Extraocular muscles intact.  HEENT: Head atraumatic, normocephalic. Oropharynx and nasopharynx clear.  NECK:  Supple, no jugular venous distention. No thyroid enlargement, no tenderness.  LUNGS: Decreased breath sounds bilaterally, no wheezing, rales heard in the right lung. no use of accessory muscles of respiration.  CARDIOVASCULAR: S1, S2 tachycardia noted. No murmurs, rubs, or gallops.  ABDOMEN: Soft, nontender, nondistended. Bowel sounds present. No organomegaly or mass.  EXTREMITIES: No pedal edema,  cyanosis, or clubbing.  NEUROLOGIC: Cranial nerves II through XII are intact. Muscle strength 5/5 in all extremities. Sensation intact. Gait not checked.  PSYCHIATRIC: The patient is alert and oriented x 3.  SKIN: No obvious rash, lesion, or ulcer.   LABORATORY PANEL:   CBC Recent Labs  Lab 08/02/18 1242  WBC 28.1*  HGB 13.1  HCT 41.6  PLT 269  MCV 86.3  MCH 27.2  MCHC 31.5  RDW 16.2*  LYMPHSABS 0.8  MONOABS 0.8  EOSABS 0.0  BASOSABS 0.1   ------------------------------------------------------------------------------------------------------------------  Chemistries  Recent Labs  Lab 08/02/18 1242  NA 132*  K 3.2*  CL 91*  CO2 29  GLUCOSE 249*  BUN 24*  CREATININE 1.82*  CALCIUM 8.3*   ------------------------------------------------------------------------------------------------------------------ CrCl  cannot be calculated (Unknown ideal weight.). ------------------------------------------------------------------------------------------------------------------ No results for input(s): TSH, T4TOTAL, T3FREE, THYROIDAB in the last 72 hours.  Invalid input(s): FREET3   Coagulation profile No results for input(s): INR, PROTIME in the last 168 hours. ------------------------------------------------------------------------------------------------------------------- No results for input(s): DDIMER in the last 72 hours. -------------------------------------------------------------------------------------------------------------------  Cardiac Enzymes Recent Labs  Lab 08/02/18 1242  TROPONINI 0.03*   ------------------------------------------------------------------------------------------------------------------ Invalid input(s): POCBNP  ---------------------------------------------------------------------------------------------------------------  Urinalysis    Component Value Date/Time   COLORURINE YELLOW (A) 02/15/2018 0030   APPEARANCEUR CLOUDY (A) 02/15/2018 0030   APPEARANCEUR Cloudy (A) 11/16/2017 1015   LABSPEC 1.011 02/15/2018 0030   PHURINE 6.0 02/15/2018 0030   GLUCOSEU NEGATIVE 02/15/2018 0030   HGBUR MODERATE (A) 02/15/2018 0030   BILIRUBINUR small 05/14/2018 1057   BILIRUBINUR Negative 11/16/2017 1015   KETONESUR 5 (A) 02/15/2018 0030   PROTEINUR Positive (A) 05/14/2018 1057   PROTEINUR NEGATIVE 02/15/2018 0030   UROBILINOGEN 4.0 (A) 05/14/2018 1057   NITRITE negative 05/14/2018 1057   NITRITE NEGATIVE 02/15/2018 0030   LEUKOCYTESUR Large (3+) (A) 05/14/2018 1057   LEUKOCYTESUR 3+ (A) 11/16/2017 1015     RADIOLOGY: Dg Chest 1 View  Result Date: 08/02/2018 CLINICAL DATA:  Worsening shortness of breath EXAM: CHEST  1 VIEW COMPARISON:  03/15/2018 FINDINGS: Cardiomegaly, vascular congestion. Focal right lower lobe airspace opacity concerning for pneumonia.  Atelectasis or scarring at the left base. No effusions or acute bony abnormality. IMPRESSION: Worsening airspace opacity at the right lung base concerning for pneumonia. Cardiomegaly, vascular congestion. Left base atelectasis or scarring. Electronically Signed   By: Rolm Baptise M.D.   On: 08/02/2018 12:35    EKG: Orders placed or performed during the hospital encounter of 08/02/18  . EKG 12-Lead  . EKG 12-Lead  . ED EKG  . ED EKG    IMPRESSION AND PLAN: 78 year old male patient with a known history of COPD on oxygen via nasal cannula at 2 L, chronic diastolic heart failure, prostate hypertrophy, coronary artery disease, hyperlipidemia, hypertension, renal cancer presented to the emergency room for shortness of breath, cough and congestion.  -Sepsis secondary to pneumonia Check blood cultures Broad-spectrum IV antibiotics with vancomycin and Zosyn Follow-up lactic acid level IV fluids  -Right lung pneumonia Patient on IV vancomycin and Zosyn antibiotic  -Acute on chronic respiratory failure with hypoxia secondary to pneumonia Is on via nasal cannula at 4 L  -Chronic diastolic heart failure Stable Medical management  -DVT prophylaxis subcu Lovenox daily  -High risk of morbidity and mortality considering multiple medical problems  All the records are reviewed and case discussed with ED provider. Management plans discussed with the patient, family and they are in agreement.  CODE STATUS:DNR    Code Status Orders  (From  admission, onward)         Start     Ordered   08/02/18 1433  Do not attempt resuscitation (DNR)  Continuous    Question Answer Comment  In the event of cardiac or respiratory ARREST Do not call a "code blue"   In the event of cardiac or respiratory ARREST Do not perform Intubation, CPR, defibrillation or ACLS   In the event of cardiac or respiratory ARREST Use medication by any route, position, wound care, and other measures to relive pain and  suffering. May use oxygen, suction and manual treatment of airway obstruction as needed for comfort.      08/02/18 1432        Code Status History    Date Active Date Inactive Code Status Order ID Comments User Context   02/15/2018 0150 02/16/2018 1526 Full Code [id number]  Amelia Jo, MD Inpatient   04/21/2016 0702 04/21/2016 1703 Full Code [id number]  Holley Raring, NP ED   01/04/2015 1401 01/07/2015 1737 Full Code [id number]  Greggory Keen, MD Inpatient   01/03/2015 1503 01/04/2015 1401 Full Code [id number]  Christene Lye, MD Inpatient       TOTAL TIME TAKING CARE OF THIS PATIENT: 55 minutes.    Saundra Shelling M.D on 08/02/2018 at 2:36 PM  Between 7am to 6pm - Pager - [phone]  After 6pm go to [email or web address] - password [email or web address] [facility]  [facility]  Office  [phone]  CC: Primary care physician; Carmon Ginsberg, [place]

## 2018-08-02 NOTE — ED Notes (Signed)
Patient is resting comfortably. 

## 2018-08-02 NOTE — ED Notes (Signed)
Patient in bed resting watching TV. No distress observed at this time. Daughter is at bedside.

## 2018-08-02 NOTE — Progress Notes (Signed)
Pharmacy Antibiotic Note  Bobby Peterson is a 78 y.o. male admitted on 08/02/2018 with sepsis secondary to pneumonia.  Pharmacy has been consulted for vancomycin and Zosyn dosing.  Plan: Vancomycin 750 mg IV q24hr with first dose tonight at 2200. Goal trough 15-20 mcg/ml. Trough ordered 1/5 at 2100. ke 0.03, t1/2 21.1 hr, VD 47 L  Zosyn 3.375 g IV q8hr extended infusion with first dose tonight at 2000  Weight: 165 lb (74.8 kg)  Temp (24hrs), Avg:99.5 F (37.5 C), Min:98.3 F (36.8 C), Max:101.2 F (38.4 C)  Recent Labs  Lab 08/02/18 1242 08/02/18 1247  WBC 28.1*  --   CREATININE 1.82*  --   LATICACIDVEN  --  2.39*    Estimated Creatinine Clearance: 31.1 mL/min (A) (by C-G formula based on SCr of 1.82 mg/dL (H)).    Allergies  Allergen Reactions  . Fenofibrate     Other reaction(s): Headache  . Lisinopril Cough  . Niacin     Other reaction(s): Dizziness  . Phenergan [Promethazine Hcl]     NVD  . Simvastatin     Other reaction(s): Headache    Antimicrobials this admission: Vancomycin 1/2 >>  Zosyn 1/2 >>   Dose adjustments this admission: NA  Microbiology results: 1/2 BCx: pending 1/2 MRSA PCR: pending  Thank you for allowing pharmacy to be a part of this patient's care.  Tawnya Crook, PharmD Pharmacy Resident  08/02/2018 5:04 PM

## 2018-08-02 NOTE — Progress Notes (Signed)
Advanced care plan.  Purpose of the Encounter: CODE STATUS  Parties in Attendance: Patient and family  Patient's Decision Capacity: Good  Subjective/Patient's story: Patient presented to emergency room for shortness of breath and cough Has low oxygen saturation   Objective/Medical story Patient has pneumonia needs broad-spectrum antibiotics WBC is elevated and appears septic Needs oxygen via nasal cannula   Goals of care determination:  Advance care directives goals of care and treatment plan discussed with patient in detail Patient and family do not want CPR, intubation ventilator if the need arises   CODE STATUS: DNR   Time spent discussing advanced care planning: 16 minutes

## 2018-08-02 NOTE — ED Notes (Signed)
Critical result lactic acid 3.5. EDP made aware. No verbal orders at this time.

## 2018-08-02 NOTE — ED Provider Notes (Signed)
[facility] Emergency Department Provider Note       Time seen: ----------------------------------------- 12:18 PM on 08/02/2018 -----------------------------------------   I have reviewed the triage vital signs and the nursing notes.  HISTORY   Chief Complaint Shortness of Breath    HPI Bobby Peterson is a 78 y.o. male with a history of acute respiratory failure with hypoxia, CHF, COPD, coronary artery disease, hyperlipidemia, hypertension who presents to the ED for worsening shortness of breath over the past week.  Patient had to be coerced to coming to the ER by his family.  Family states oxygen saturations were in the 80s at home.  He appeared fatigued with pursed lip breathing on arrival according to the triage note.  Past Medical History:  Diagnosis Date  . Acute respiratory failure with hypoxia ([place]) 12/03/2013   Overview:  Overview:  2 L O2 continuously  Last Assessment & Plan:  Continue 2 L O2 continuously   . Atrial tachycardia ([place]) 04/21/2016  . 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 BP (high blood pressure) 07/08/2015  . Chronic diastolic CHF (congestive heart failure) ([place])   . Chronic diastolic heart failure ([place]) 07/08/2015  . Chronic hypoxemic respiratory failure ([place]) 12/03/2013   2 L O2 continuously   . Chronic respiratory failure (H[facility])    a. on home O2  . COPD (chronic obstructive pulmonary disease) ([place])   . COPD (chronic obstructive pulmonary disease) ([place]) 12/03/2013   May fifth 2015 simple spirometry>> ratio 45%, FEV1 0.95 L (34% per day)   . Coronary artery disease   . Coronary atherosclerosis 07/08/2015   Dr. Ubaldo Glassing   . Esophageal reflux 07/08/2015  . Hernia, umbilical   . Hypercholesteremia   . Hypertension   . Obesity   . Renal cancer ([place])    a. s/p nephrectomy in 1980's  . Smoking 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 pack years 05/10/2016    Patient Active Problem List   Diagnosis Date Noted  . DNR (do not resuscitate) discussion   .  Palliative care by specialist   . 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 Smoking 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 pack years 05/10/2016  . Atrial tachycardia ([place]) 04/21/2016  . Allergic rhinitis 07/08/2015  . 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 Coronary atherosclerosis 07/08/2015  . Chronic diastolic heart failure ([place]) 07/08/2015  . Chronic kidney disease (CKD), stage III (moderate) ([place]) 07/08/2015  . Elevated blood sugar 07/08/2015  . Esophageal reflux 07/08/2015  . HLD (hyperlipidemia) 07/08/2015  . BP (high blood pressure) 07/08/2015  . Calculus of kidney 07/08/2015  . COPD (chronic obstructive pulmonary disease) ([place]) 12/03/2013  . Chronic hypoxemic respiratory failure ([place]) 12/03/2013    Past Surgical History:  Procedure Laterality Date  . CHOLECYSTECTOMY N/A 12/22/2014   Procedure: LAPAROSCOPIC CHOLECYSTECTOMY WITH INTRAOPERATIVE CHOLANGIOGRAM;  Surgeon: Christene Lye, MD;  Location: [facility] ORS;  Service: General;  Laterality: N/A;  . COLONOSCOPY    . ERCP N/A 01/05/2015   Procedure: ENDOSCOPIC RETROGRADE CHOLANGIOPANCREATOGRAPHY (ERCP);  Surgeon: Hulen Luster, MD;  Location: [facility] ENDOSCOPY;  Service: Endoscopy;  Laterality: N/A;  . ERCP N/A 02/19/2015   Procedure: ENDOSCOPIC RETROGRADE CHOLANGIOPANCREATOGRAPHY (ERCP);  Surgeon: Hulen Luster, MD;  Location: [facility] ENDOSCOPY;  Service: Gastroenterology;  Laterality: N/A;  . GALLBLADDER SURGERY  11/2014  . NEPHRECTOMY  1990  . OMENTECTOMY  12/22/2014   Procedure: OMENTECTOMY;  Surgeon: Christene Lye, MD;  Location: [facility] ORS;  Service: General;;  Partial omentectomy  . STENT REMOVAL    .  UMBILICAL HERNIA REPAIR N/A 12/22/2014   Procedure: HERNIA REPAIR UMBILICAL ADULT;  Surgeon: Christene Lye, MD;  Location: [facility] ORS;  Service: General;  Laterality: N/A;    Allergies Fenofibrate; Lisinopril; Niacin; Phenergan [promethazine hcl]; and Simvastatin  Social History Social History   Tobacco Use  .  Smoking status: Former Smoker    Packs/day: 1.00    Years: 50.00    Pack years: 50.00    Types: Cigarettes    Last attempt to quit: 12/04/2003    Years since quitting: 14.6  . Smokeless tobacco: Current User    Types: Chew  . Tobacco comment: Has been chewing tobacco since quitting smoking in 2005.  Substance Use Topics  . Alcohol use: No  . Drug use: No   Review of Systems Constitutional: Negative for fever. Cardiovascular: Negative for chest pain. Respiratory: Positive for shortness of breath Gastrointestinal: Negative for abdominal pain, vomiting and diarrhea. Musculoskeletal: Negative for back pain. Skin: Negative for rash. Neurological: Negative for headaches, positive for weakness  All systems negative/normal/unremarkable except as stated in the HPI  ____________________________________________   PHYSICAL EXAM:  VITAL SIGNS: ED Triage Vitals [08/02/18 1211]  Enc Vitals Group     BP (!) 108/56     Pulse Rate (!) 131     Resp (!) 24     Temp 98.3 F (36.8 C)     Temp Source Oral     SpO2 (!) 88 %     Weight      Height      Head Circumference      Peak Flow      Pain Score      Pain Loc      Pain Edu?      Excl. in [place]?    Constitutional: Alert and oriented.  Chronically ill-appearing, mild to moderate distress Eyes: Conjunctivae are normal. Normal extraocular movements. ENT      Head: Normocephalic and atraumatic.      Nose: No congestion/rhinnorhea.      Mouth/Throat: Mucous membranes are moist.      Neck: No stridor. Cardiovascular: Normal rate, regular rhythm. No murmurs, rubs, or gallops. Respiratory: Tachypnea with scattered rhonchi and rales bilaterally, decreased breath sounds on the right compared to the left Gastrointestinal: Soft and nontender. Normal bowel sounds Musculoskeletal: Nontender with normal range of motion in extremities. No lower extremity tenderness nor edema. Neurologic:  Normal speech and language. No gross focal neurologic  deficits are appreciated.  Generalized weakness, nothing focal Skin:  Skin is warm, dry and intact. No rash noted. Psychiatric: Mood and affect are normal. ____________________________________________  EKG: Interpreted by me.  Atrial fibrillation with a rapid ventricular response, rate is 124 bpm, normal axis, long QT  ____________________________________________  ED COURSE:  As part of my medical decision making, I reviewed the following data within the [place] History obtained from family if available, nursing notes, old chart and ekg, as well as notes from prior ED visits. Patient presented for weakness and shortness of breath with hypoxia, we will assess with labs and imaging as indicated at this time.   Procedures ____________________________________________   LABS (pertinent positives/negatives)  Labs Reviewed  CBC WITH DIFFERENTIAL/PLATELET - Abnormal; Notable for the following components:      Result Value   WBC 28.1 (*)    RDW 16.2 (*)    Neutro Abs 26.1 (*)    Abs Immature Granulocytes 0.37 (*)    All other components within normal limits  BASIC  METABOLIC PANEL - Abnormal; Notable for the following components:   Sodium 132 (*)    Potassium 3.2 (*)    Chloride 91 (*)    Glucose, Bld 249 (*)    BUN 24 (*)    Creatinine, Ser 1.82 (*)    Calcium 8.3 (*)    GFR calc non Af Amer 35 (*)    GFR calc Af Amer 40 (*)    All other components within normal limits  TROPONIN I - Abnormal; Notable for the following components:   Troponin I 0.03 (*)    All other components within normal limits  BLOOD GAS, VENOUS - Abnormal; Notable for the following components:   pO2, Ven 31.0 (*)    Bicarbonate 33.4 (*)    Acid-Base Excess 7.0 (*)    All other components within normal limits  CG4 I-STAT (LACTIC ACID) - Abnormal; Notable for the following components:   Lactic Acid, Venous 2.39 (*)    All other components within normal limits  CULTURE, BLOOD (ROUTINE X 2)   CULTURE, BLOOD (ROUTINE X 2)  BRAIN NATRIURETIC PEPTIDE  I-STAT CG4 LACTIC ACID, ED   CRITICAL CARE Performed by: Laurence Aly   Total critical care time: 30 minutes  Critical care time was exclusive of separately billable procedures and treating other patients.  Critical care was necessary to treat or prevent imminent or life-threatening deterioration.  Critical care was time spent personally by me on the following activities: development of treatment plan with patient and/or surrogate as well as nursing, discussions with consultants, evaluation of patient's response to treatment, examination of patient, obtaining history from patient or surrogate, ordering and performing treatments and interventions, ordering and review of laboratory studies, ordering and review of radiographic studies, pulse oximetry and re-evaluation of patient's condition.  RADIOLOGY Images were viewed by me  Chest x-ray IMPRESSION: Worsening airspace opacity at the right lung base concerning for pneumonia.  Cardiomegaly, vascular congestion.  Left base atelectasis or scarring. ____________________________________________   DIFFERENTIAL DIAGNOSIS   CHF, COPD, pneumonia, acidemia, volume overload, pneumothorax  FINAL ASSESSMENT AND PLAN  Shortness of breath, community-acquired pneumonia, sepsis   Plan: The patient had presented for shortness of breath, hypoxemia. Patient's labs did reveal her markedly elevated white blood cell count and lactic acidosis consistent with sepsis. Patient's imaging revealed a worsening opacity in the right lung base.  We did start him on vancomycin and Zosyn.  Currently his blood pressure is stable 078 systolic.  We will start gentle IV fluids as well   Laurence Aly, MD    Note: This note was generated in part or whole with voice recognition software. Voice recognition is usually quite accurate but there are transcription errors that can and very often  do occur. I apologize for any typographical errors that were not detected and corrected.     Earleen Newport, MD 08/02/18 [phone]

## 2018-08-02 NOTE — ED Notes (Signed)
Pt has been stuck 3 times without success for second culture. First culture sent and antibiotics started.

## 2018-08-03 ENCOUNTER — Ambulatory Visit: Payer: Medicare HMO | Admitting: Family Medicine

## 2018-08-03 ENCOUNTER — Other Ambulatory Visit: Payer: Self-pay

## 2018-08-03 LAB — BASIC METABOLIC PANEL
Anion gap: 10 (ref 5–15)
BUN: 25 mg/dL — AB (ref 8–23)
CO2: 27 mmol/L (ref 22–32)
Calcium: 7.8 mg/dL — ABNORMAL LOW (ref 8.9–10.3)
Chloride: 99 mmol/L (ref 98–111)
Creatinine, Ser: 1.74 mg/dL — ABNORMAL HIGH (ref 0.61–1.24)
GFR calc Af Amer: 42 mL/min — ABNORMAL LOW (ref >60)
GFR calc non Af Amer: 36 mL/min — ABNORMAL LOW (ref >60)
GLUCOSE: 127 mg/dL — AB (ref 70–99)
Potassium: 3 mmol/L — ABNORMAL LOW (ref 3.5–5.1)
Sodium: 136 mmol/L (ref 135–145)

## 2018-08-03 LAB — BLOOD CULTURE ID PANEL (REFLEXED)
Acinetobacter baumannii: NOT DETECTED
CANDIDA GLABRATA: NOT DETECTED
Candida albicans: NOT DETECTED
Candida krusei: NOT DETECTED
Candida parapsilosis: NOT DETECTED
Candida tropicalis: NOT DETECTED
Enterobacter cloacae complex: NOT DETECTED
Enterobacteriaceae species: NOT DETECTED
Enterococcus species: NOT DETECTED
Escherichia coli: NOT DETECTED
Haemophilus influenzae: NOT DETECTED
Klebsiella oxytoca: NOT DETECTED
Klebsiella pneumoniae: NOT DETECTED
Listeria monocytogenes: NOT DETECTED
NEISSERIA MENINGITIDIS: NOT DETECTED
Proteus species: NOT DETECTED
Pseudomonas aeruginosa: NOT DETECTED
Serratia marcescens: NOT DETECTED
Staphylococcus aureus (BCID): NOT DETECTED
Staphylococcus species: NOT DETECTED
Streptococcus agalactiae: NOT DETECTED
Streptococcus pneumoniae: DETECTED — AB
Streptococcus pyogenes: NOT DETECTED
Streptococcus species: DETECTED — AB

## 2018-08-03 LAB — CBC
HCT: 33.7 % — ABNORMAL LOW (ref 39.0–52.0)
Hemoglobin: 10.8 g/dL — ABNORMAL LOW (ref 13.0–17.0)
MCH: 27.7 pg (ref 26.0–34.0)
MCHC: 32 g/dL (ref 30.0–36.0)
MCV: 86.4 fL (ref 80.0–100.0)
Platelets: 233 10*3/uL (ref 150–400)
RBC: 3.9 MIL/uL — ABNORMAL LOW (ref 4.22–5.81)
RDW: 16.3 % — ABNORMAL HIGH (ref 11.5–15.5)
WBC: 22 10*3/uL — ABNORMAL HIGH (ref 4.0–10.5)
nRBC: 0 % (ref 0.0–0.2)

## 2018-08-03 LAB — MAGNESIUM: Magnesium: 2.3 mg/dL (ref 1.7–2.4)

## 2018-08-03 LAB — TROPONIN I: Troponin I: 0.03 ng/mL (ref <0.03)

## 2018-08-03 MED ORDER — ADULT MULTIVITAMIN W/MINERALS CH
1.0000 | ORAL_TABLET | Freq: Every day | ORAL | Status: DC
  Administered 2018-08-03 – 2018-08-06 (×3): 1 via ORAL
  Filled 2018-08-03 (×3): qty 1

## 2018-08-03 MED ORDER — POTASSIUM CHLORIDE IN NACL 20-0.45 MEQ/L-% IV SOLN
INTRAVENOUS | Status: AC
  Administered 2018-08-03: 21:00:00 via INTRAVENOUS
  Filled 2018-08-03: qty 1000

## 2018-08-03 MED ORDER — SODIUM CHLORIDE 0.9 % IV BOLUS
500.0000 mL | Freq: Once | INTRAVENOUS | Status: AC
  Administered 2018-08-03: 500 mL via INTRAVENOUS

## 2018-08-03 MED ORDER — SODIUM CHLORIDE 0.9 % IV SOLN
2.0000 g | INTRAVENOUS | Status: DC
  Administered 2018-08-03 – 2018-08-05 (×3): 2 g via INTRAVENOUS
  Filled 2018-08-03 (×2): qty 2
  Filled 2018-08-03 (×3): qty 20

## 2018-08-03 MED ORDER — VANCOMYCIN HCL IN DEXTROSE 750-5 MG/150ML-% IV SOLN
750.0000 mg | INTRAVENOUS | Status: DC
  Administered 2018-08-03: 750 mg via INTRAVENOUS
  Filled 2018-08-03 (×3): qty 150

## 2018-08-03 MED ORDER — PREMIER PROTEIN SHAKE
11.0000 [oz_av] | Freq: Two times a day (BID) | ORAL | Status: DC
  Administered 2018-08-05: 11 [oz_av] via ORAL

## 2018-08-03 MED ORDER — POTASSIUM CHLORIDE CRYS ER 20 MEQ PO TBCR
40.0000 meq | EXTENDED_RELEASE_TABLET | ORAL | Status: AC
  Administered 2018-08-03 (×2): 40 meq via ORAL
  Filled 2018-08-03 (×2): qty 2

## 2018-08-03 MED ORDER — POTASSIUM CHLORIDE 10 MEQ/100ML IV SOLN
10.0000 meq | INTRAVENOUS | Status: AC
  Administered 2018-08-03 (×2): 10 meq via INTRAVENOUS
  Filled 2018-08-03 (×2): qty 100

## 2018-08-03 NOTE — Progress Notes (Signed)
Patient admitted to 2A room 239 via bed. Report called and given to this RN.  Patient oriented to floor and surroundings. Call bell in reach. Family at bedside.

## 2018-08-03 NOTE — Progress Notes (Addendum)
Pt WBC at 22.0 and lactic at 3.5 and HR going up to 110-115. Notify prime and talked to dr. Darvin Neighbours place and order for 0.45 % NaCl 20 mEq/ infusion at 100 ml/hr and also place order to check plasma in the morning at 0500. Will continue to monitor.  Update 2050: Pt has a diminished lung sounds with wheezing and HR sustained at 140-150. Dr. Darvin Neighbours made aware and that's fluids was started. Just ordered to keep monitor pt. Will continue to monitor.  Update 2137: Pt HR sustaining at 140's-150's. Pt was just lying without any distress. Talked with dr. Jerelyn Charles and ordered STAT EKG. Will continue to monitor.   Update 2217: Dr. Jerelyn Charles ordered Troponin now then every 6 hours 3 occurences , bolus of 500 ml and two runs of potassium 10 mEq in 100 ml IVPB, Mag STAT. Will continue to monitor.  Update 0215: Pt was alert and oriented times 4 but repiratory called and reported that pt was not himself. On assessment pt was only alert to self and time but was disoriented with situation and place. Notify prime. Will continue to monitor.  Update 0253: Talked to Dr. Aliene Altes and states will place order. Will continue to monitor.    Update: Pt wanting to go home and was also agitated. Notify prime and talked to Dr. Aliene Altes and place an order for HALDOL 5 mg IV once. Will continue to monitor.

## 2018-08-03 NOTE — Progress Notes (Signed)
PHARMACY - PHYSICIAN COMMUNICATION CRITICAL VALUE ALERT - BLOOD CULTURE IDENTIFICATION (BCID)  Bobby Peterson is an 78 y.o. male who presented to [facility] on 08/02/2018 with a chief complaint of SOB w/ h/o COPD.  Assessment:  WBC 28, LA 2.8 >> 3.5, CXR: pneumonia, 2/4 anaerobic/aerobic GPC BCID Strep pneumonia   Name of physician (or Provider) Contacted: Arta Silence  Current antibiotics: Vanc/zosyn  Changes to prescribed antibiotics recommended:  Patient is on recommended antibiotics - No changes needed  Results for orders placed or performed during the hospital encounter of 08/02/18  Blood Culture ID Panel (Reflexed) (Collected: 08/02/2018 12:42 PM)  Result Value Ref Range   Enterococcus species NOT DETECTED NOT DETECTED   Listeria monocytogenes NOT DETECTED NOT DETECTED   Staphylococcus species NOT DETECTED NOT DETECTED   Staphylococcus aureus (BCID) NOT DETECTED NOT DETECTED   Streptococcus species DETECTED (A) NOT DETECTED   Streptococcus agalactiae NOT DETECTED NOT DETECTED   Streptococcus pneumoniae DETECTED (A) NOT DETECTED   Streptococcus pyogenes NOT DETECTED NOT DETECTED   Acinetobacter baumannii NOT DETECTED NOT DETECTED   Enterobacteriaceae species NOT DETECTED NOT DETECTED   Enterobacter cloacae complex NOT DETECTED NOT DETECTED   Escherichia coli NOT DETECTED NOT DETECTED   Klebsiella oxytoca NOT DETECTED NOT DETECTED   Klebsiella pneumoniae NOT DETECTED NOT DETECTED   Proteus species NOT DETECTED NOT DETECTED   Serratia marcescens NOT DETECTED NOT DETECTED   Haemophilus influenzae NOT DETECTED NOT DETECTED   Neisseria meningitidis NOT DETECTED NOT DETECTED   Pseudomonas aeruginosa NOT DETECTED NOT DETECTED   Candida albicans NOT DETECTED NOT DETECTED   Candida glabrata NOT DETECTED NOT DETECTED   Candida krusei NOT DETECTED NOT DETECTED   Candida parapsilosis NOT DETECTED NOT DETECTED   Candida tropicalis NOT DETECTED NOT DETECTED   Tobie Lords,  PharmD, BCPS Clinical Pharmacist 08/03/2018

## 2018-08-03 NOTE — Progress Notes (Signed)
[place] at [facility]                                                                                                                                                                                  Patient Demographics   Bobby Peterson, is a 78 y.o. male, DOB - 12-Dec-1940, RCB:[id number]  Admit date - 08/02/2018   Admitting Physician Saundra Shelling, MD  Outpatient Primary MD for the patient is Carmon Ginsberg, [place]   LOS - 1  Subjective: Patient complains of shortness of breath but wants    Review of Systems:   CONSTITUTIONAL: No documented fever. No fatigue, weakness. No weight gain, no weight loss.  EYES: No blurry or double vision.  ENT: No tinnitus. No postnasal drip. No redness of the oropharynx.  RESPIRATORY: No cough, no wheeze, no hemoptysis.  Positive dyspnea.  CARDIOVASCULAR: No chest pain. No orthopnea. No palpitations. No syncope.  GASTROINTESTINAL: No nausea, no vomiting or diarrhea. No abdominal pain. No melena or hematochezia.  GENITOURINARY: No dysuria or hematuria.  ENDOCRINE: No polyuria or nocturia. No heat or cold intolerance.  HEMATOLOGY: No anemia. No bruising. No bleeding.  INTEGUMENTARY: No rashes. No lesions.  MUSCULOSKELETAL: No arthritis. No swelling. No gout.  NEUROLOGIC: No numbness, tingling, or ataxia. No seizure-type activity.  PSYCHIATRIC: No anxiety. No insomnia. No ADD.    Vitals:   Vitals:   08/03/18 0040 08/03/18 0047 08/03/18 0622 08/03/18 1427  BP: 97/61  111/72 102/80  Pulse: (!) 108  62 93  Resp: (!) 22  18 19   Temp: 98.2 F (36.8 C)  98.8 F (37.1 C) 98.2 F (36.8 C)  TempSrc: Oral  Oral Oral  SpO2: 97% 95% 94% 95%  Weight:        Wt Readings from Last 3 Encounters:  08/02/18 74.8 kg  05/14/18 75.7 kg  04/30/18 78.7 kg     Intake/Output Summary (Last 24 hours) at 08/03/2018 1526 Last data filed at 08/03/2018 0430 Gross per 24 hour  Intake -  Output 275 ml  Net -275 ml    Physical Exam:    GENERAL: Pleasant-appearing in no apparent distress.  HEAD, EYES, EARS, NOSE AND THROAT: Atraumatic, normocephalic. Extraocular muscles are intact. Pupils equal and reactive to light. Sclerae anicteric. No conjunctival injection. No oro-pharyngeal erythema.  NECK: Supple. There is no jugular venous distention. No bruits, no lymphadenopathy, no thyromegaly.  HEART: Regular rate and rhythm,. No murmurs, no rubs, no clicks.  LUNGS: Rhonchus breath sounds bilaterally ABDOMEN: Soft, flat, nontender, nondistended. Has good bowel sounds. No hepatosplenomegaly appreciated.  EXTREMITIES: No evidence of any cyanosis, clubbing, or peripheral edema.  +2 pedal and radial pulses bilaterally.  NEUROLOGIC: The patient is alert, awake, and oriented x3 with no focal motor or sensory deficits appreciated bilaterally.  SKIN: Moist and warm with no rashes appreciated.  Psych: Not anxious, depressed LN: No inguinal LN enlargement    Antibiotics   Anti-infectives (From admission, onward)   Start     Dose/Rate Route Frequency Ordered Stop   08/03/18 1600  cefTRIAXone (ROCEPHIN) 2 g in sodium chloride 0.9 % 100 mL IVPB     2 g 200 mL/hr over 30 Minutes Intravenous Every 24 hours 08/03/18 1144     08/03/18 0050  vancomycin (VANCOCIN) IVPB 750 mg/150 ml premix  Status:  Discontinued     750 mg 150 mL/hr over 60 Minutes Intravenous Every 24 hours 08/03/18 0050 08/03/18 1144   08/02/18 2200  vancomycin (VANCOCIN) IVPB 750 mg/150 ml premix  Status:  Discontinued     750 mg 150 mL/hr over 60 Minutes Intravenous Every 24 hours 08/02/18 1706 08/03/18 0050   08/02/18 2000  piperacillin-tazobactam (ZOSYN) IVPB 3.375 g  Status:  Discontinued     3.375 g 12.5 mL/hr over 240 Minutes Intravenous Every 8 hours 08/02/18 1706 08/03/18 1144   08/02/18 1245  vancomycin (VANCOCIN) IVPB 1000 mg/200 mL premix     1,000 mg 200 mL/hr over 60 Minutes Intravenous  Once 08/02/18 1239 08/02/18 1418   08/02/18 1245   piperacillin-tazobactam (ZOSYN) IVPB 3.375 g     3.375 g 100 mL/hr over 30 Minutes Intravenous  Once 08/02/18 1239 08/02/18 1347      Medications   Scheduled Meds: . aspirin EC  81 mg Oral Daily  . enoxaparin (LOVENOX) injection  40 mg Subcutaneous Q24H  . ipratropium-albuterol  3 mL Nebulization Q6H  . metoprolol succinate  100 mg Oral Daily  . pantoprazole  40 mg Oral Daily  . potassium chloride  40 mEq Oral Q4H  . pravastatin  20 mg Oral Daily  . tamsulosin  0.4 mg Oral Daily  . [START ON 08/06/2018] vitamin B-12  1,000 mcg Oral Weekly   Continuous Infusions: . sodium chloride 75 mL/hr at 08/02/18 1929  . cefTRIAXone (ROCEPHIN)  IV     PRN Meds:.acetaminophen **OR** acetaminophen, guaiFENesin-dextromethorphan, ondansetron **OR** ondansetron (ZOFRAN) IV, senna-docusate   Data Review:   Micro Results Recent Results (from the past 240 hour(s))  Blood culture (routine x 2)     Status: None (Preliminary result)   Collection Time: 08/02/18 12:42 PM  Result Value Ref Range Status   Specimen Description   Final    BLOOD RIGHT ANTECUBITAL Performed at [facility], [place] [address] [place], [place] [address]    Special Requests   Final    BOTTLES DRAWN AEROBIC AND ANAEROBIC Blood Culture adequate volume Performed at [facility], [address] [place], [place] [address]    Culture  Setup Time   Final    Organism ID to follow IN BOTH AEROBIC AND ANAEROBIC BOTTLES GRAM POSITIVE COCCI CRITICAL RESULT CALLED TO, READ BACK BY AND VERIFIED WITH: DAVID BESANTI @0145  08/03/18 AKT GRAM STAIN REVIEWED-AGREE WITH RESULT Performed at [facility], [place] [address], [place], [place] [address]    Culture GRAM POSITIVE COCCI  Final   Report Status PENDING  Incomplete  Blood Culture ID Panel (Reflexed)     Status: Abnormal   Collection Time: 08/02/18 12:42 PM  Result Value Ref Range Status   Enterococcus species NOT DETECTED NOT DETECTED Final   Listeria  monocytogenes NOT DETECTED NOT DETECTED Final  Staphylococcus species NOT DETECTED NOT DETECTED Final   Staphylococcus aureus (BCID) NOT DETECTED NOT DETECTED Final   Streptococcus species DETECTED (A) NOT DETECTED Final    Comment: CRITICAL RESULT CALLED TO, READ BACK BY AND VERIFIED WITH: DAVID BESANTI @0145  08/03/18 AKT    Streptococcus agalactiae NOT DETECTED NOT DETECTED Final   Streptococcus pneumoniae DETECTED (A) NOT DETECTED Final    Comment: CRITICAL RESULT CALLED TO, READ BACK BY AND VERIFIED WITH: DAVID BESANT @0145  08/03/18 AKT    Streptococcus pyogenes NOT DETECTED NOT DETECTED Final   Acinetobacter baumannii NOT DETECTED NOT DETECTED Final   Enterobacteriaceae species NOT DETECTED NOT DETECTED Final   Enterobacter cloacae complex NOT DETECTED NOT DETECTED Final   Escherichia coli NOT DETECTED NOT DETECTED Final   Klebsiella oxytoca NOT DETECTED NOT DETECTED Final   Klebsiella pneumoniae NOT DETECTED NOT DETECTED Final   Proteus species NOT DETECTED NOT DETECTED Final   Serratia marcescens NOT DETECTED NOT DETECTED Final   Haemophilus influenzae NOT DETECTED NOT DETECTED Final   Neisseria meningitidis NOT DETECTED NOT DETECTED Final   Pseudomonas aeruginosa NOT DETECTED NOT DETECTED Final   Candida albicans NOT DETECTED NOT DETECTED Final   Candida glabrata NOT DETECTED NOT DETECTED Final   Candida krusei NOT DETECTED NOT DETECTED Final   Candida parapsilosis NOT DETECTED NOT DETECTED Final   Candida tropicalis NOT DETECTED NOT DETECTED Final    Comment: Performed at [facility], [address] [place], [place] [address]  MRSA PCR Screening     Status: None   Collection Time: 08/02/18  5:05 PM  Result Value Ref Range Status   MRSA by PCR NEGATIVE NEGATIVE Final    Comment:        The GeneXpert MRSA Assay (FDA approved for NASAL specimens only), is one component of a comprehensive MRSA colonization surveillance program. It is not intended to diagnose  MRSA infection nor to guide or monitor treatment for MRSA infections. Performed at [facility], [address] [place], [place] [address]     Radiology Reports Dg Chest 1 View  Result Date: 08/02/2018 CLINICAL DATA:  Worsening shortness of breath EXAM: CHEST  1 VIEW COMPARISON:  03/15/2018 FINDINGS: Cardiomegaly, vascular congestion. Focal right lower lobe airspace opacity concerning for pneumonia. Atelectasis or scarring at the left base. No effusions or acute bony abnormality. IMPRESSION: Worsening airspace opacity at the right lung base concerning for pneumonia. Cardiomegaly, vascular congestion. Left base atelectasis or scarring. Electronically Signed   By: Rolm Baptise M.D.   On: 08/02/2018 12:35     CBC Recent Labs  Lab 08/02/18 1242 08/03/18 0621  WBC 28.1* 22.0*  HGB 13.1 10.8*  HCT 41.6 33.7*  PLT 269 233  MCV 86.3 86.4  MCH 27.2 27.7  MCHC 31.5 32.0  RDW 16.2* 16.3*  LYMPHSABS 0.8  --   MONOABS 0.8  --   EOSABS 0.0  --   BASOSABS 0.1  --     Chemistries  Recent Labs  Lab 08/02/18 1242 08/03/18 0621  NA 132* 136  K 3.2* 3.0*  CL 91* 99  CO2 29 27  GLUCOSE 249* 127*  BUN 24* 25*  CREATININE 1.82* 1.74*  CALCIUM 8.3* 7.8*   ------------------------------------------------------------------------------------------------------------------ estimated creatinine clearance is 32.5 mL/min (A) (by C-G formula based on SCr of 1.74 mg/dL (H)). ------------------------------------------------------------------------------------------------------------------ No results for input(s): HGBA1C in the last 72 hours. ------------------------------------------------------------------------------------------------------------------ No results for input(s): CHOL, HDL, LDLCALC, TRIG, CHOLHDL, LDLDIRECT in the last 72 hours. ------------------------------------------------------------------------------------------------------------------ No results  for input(s): TSH,  T4TOTAL, T3FREE, THYROIDAB in the last 72 hours.  Invalid input(s): FREET3 ------------------------------------------------------------------------------------------------------------------ No results for input(s): VITAMINB12, FOLATE, FERRITIN, TIBC, IRON, RETICCTPCT in the last 72 hours.  Coagulation profile No results for input(s): INR, PROTIME in the last 168 hours.  No results for input(s): DDIMER in the last 72 hours.  Cardiac Enzymes Recent Labs  Lab 08/02/18 1242  TROPONINI 0.03*   ------------------------------------------------------------------------------------------------------------------ Invalid input(s): POCBNP    Assessment & Plan   78 year old male patient with a known history of COPD on oxygen via nasal cannula at 2 L, chronic diastolic heart failure, prostate hypertrophy, coronary artery disease, hyperlipidemia, hypertension, renal cancer presented to the emergency room for shortness of breath, cough and congestion.  -Sepsis secondary to pneumonia Follow blood cultures Ceftriaxone clinically stable I will ask speech to see the patient for swallow eval  -Right lung pneumonia Patient on IV vancomycin and Zosyn antibiotic  -Acute on chronic respiratory failure with hypoxia secondary to pneumonia Is on via nasal cannula at 4 L  -Chronic diastolic heart failure Clinically stable Lasix on hold And I will discontinue IV fluids Continue Toprol-XL  -BPH continue Flomax  -GERD continue Prilosec  -Hyperlipidemia continue Pravachol  -Miscellaneous Lovenox for DVT prophylaxis    -DVT prophylaxis subcu Lovenox daily  -High risk of morbidity and mortality considering multiple medical problems     Code Status Orders  (From admission, onward)         Start     Ordered   08/02/18 1433  Do not attempt resuscitation (DNR)  Continuous    Question Answer Comment  In the event of cardiac or respiratory ARREST Do not call a "code blue"   In the  event of cardiac or respiratory ARREST Do not perform Intubation, CPR, defibrillation or ACLS   In the event of cardiac or respiratory ARREST Use medication by any route, position, wound care, and other measures to relive pain and suffering. May use oxygen, suction and manual treatment of airway obstruction as needed for comfort.      08/02/18 1432        Code Status History    Date Active Date Inactive Code Status Order ID Comments User Context   02/15/2018 0150 02/16/2018 1526 Full Code [id number]  Amelia Jo, MD Inpatient   04/21/2016 0702 04/21/2016 1703 Full Code [id number]  Holley Raring, NP ED   01/04/2015 1401 01/07/2015 1737 Full Code [id number]  Greggory Keen, MD Inpatient   01/03/2015 1503 01/04/2015 1401 Full Code [id number]  Christene Lye, MD Inpatient           Consults none  DVT Prophylaxis  Lovenox   Lab Results  Component Value Date   PLT 233 08/03/2018     Time Spent in minutes   35 minutes spent greater than 50% of time spent in care coordination and counseling patient regarding the condition and plan of care.   Dustin Flock M.D on 08/03/2018 at 3:26 PM  Between 7am to 6pm - Pager - [phone]  After 6pm go to [email or web address] - Proofreader  [employer]   Office  [phone]

## 2018-08-03 NOTE — Plan of Care (Signed)
°  Problem: Coping: °Goal: Level of anxiety will decrease °Outcome: Progressing °  °

## 2018-08-03 NOTE — Progress Notes (Signed)
Initial Nutrition Assessment  DOCUMENTATION CODES:   Not applicable  INTERVENTION:   Premier Protein BID, each supplement provides 160 kcal and 30 grams of protein.   MVI daily   NUTRITION DIAGNOSIS:   Increased nutrient needs related to chronic illness(COPD, CHF) as evidenced by increased estimated needs.  GOAL:   Patient will meet greater than or equal to 90% of their needs  MONITOR:   PO intake, Supplement acceptance, Labs, Weight trends, Skin, I & O's  REASON FOR ASSESSMENT:   Malnutrition Screening Tool    ASSESSMENT:   78 y.o. male with a known history of COPD on oxygen via nasal cannula at 2 L, chronic diastolic heart failure, prostate hypertrophy, coronary artery disease, hyperlipidemia, hypertension, renal cancer, ex-smoker presented to the emergency room for shortness of breath and cough   Met with pt in room today. Pt unwilling to provide nutrition related history as he is upset that no one is helping him to the bathroom. Spoke to family member at bedside who is unsure as to whether or not the patient has eaten today. Per chart, pt with 8lb(5%) wt loss over 4 months; this is not significant. RD will order supplements and MVI to help pt meet his estimated needs. RD will attempt to obtain nutrition related history at follow up.   Medications reviewed and include: aspirin, lovenox, protonix, KCl, B12, ceftriaxone   Labs reviewed: K 3.0(L), BUN 25(H), creat 1.74(H) Wbc- 22.0(H)  NUTRITION - FOCUSED PHYSICAL EXAM:    Most Recent Value  Orbital Region  No depletion  Upper Arm Region  No depletion  Thoracic and Lumbar Region  No depletion  Buccal Region  No depletion  Temple Region  No depletion  Clavicle Bone Region  No depletion  Clavicle and Acromion Bone Region  No depletion  Scapular Bone Region  No depletion  Dorsal Hand  No depletion  Patellar Region  No depletion  Anterior Thigh Region  No depletion  Posterior Calf Region  No depletion  Edema (RD  Assessment)  None  Hair  Reviewed  Eyes  Reviewed  Mouth  Reviewed  Skin  Reviewed  Nails  Reviewed     Diet Order:   Diet Order            Diet Heart Room service appropriate? Yes; Fluid consistency: Thin  Diet effective now             EDUCATION NEEDS:   No education needs have been identified at this time  Skin:  Skin Assessment: Reviewed RN Assessment  Last BM:  pta  Height:   Ht Readings from Last 1 Encounters:  08/03/18 '5\' 5"'$  (1.651 m)    Weight:   Wt Readings from Last 1 Encounters:  08/02/18 74.8 kg    Ideal Body Weight:  61.8 kg  BMI:  Body mass index is 27.46 kg/m.  Estimated Nutritional Needs:   Kcal:  1700-2000kcal/day   Protein:  83-97g/day   Fluid:  >1.5L/day   Koleen Distance MS, RD, LDN Pager #- [phone] Office#- [phone] After Hours Pager: [phone]

## 2018-08-04 LAB — CBC WITH DIFFERENTIAL/PLATELET
Abs Immature Granulocytes: 0.14 10*3/uL — ABNORMAL HIGH (ref 0.00–0.07)
BASOS PCT: 0 %
Basophils Absolute: 0 10*3/uL (ref 0.0–0.1)
EOS ABS: 0.2 10*3/uL (ref 0.0–0.5)
Eosinophils Relative: 1 %
HCT: 33.6 % — ABNORMAL LOW (ref 39.0–52.0)
Hemoglobin: 10.7 g/dL — ABNORMAL LOW (ref 13.0–17.0)
IMMATURE GRANULOCYTES: 1 %
Lymphocytes Relative: 6 %
Lymphs Abs: 1 10*3/uL (ref 0.7–4.0)
MCH: 27.5 pg (ref 26.0–34.0)
MCHC: 31.8 g/dL (ref 30.0–36.0)
MCV: 86.4 fL (ref 80.0–100.0)
Monocytes Absolute: 0.5 10*3/uL (ref 0.1–1.0)
Monocytes Relative: 3 %
NRBC: 0 % (ref 0.0–0.2)
Neutro Abs: 15.2 10*3/uL — ABNORMAL HIGH (ref 1.7–7.7)
Neutrophils Relative %: 89 %
Platelets: 263 10*3/uL (ref 150–400)
RBC: 3.89 MIL/uL — ABNORMAL LOW (ref 4.22–5.81)
RDW: 16.5 % — ABNORMAL HIGH (ref 11.5–15.5)
WBC: 17 10*3/uL — ABNORMAL HIGH (ref 4.0–10.5)

## 2018-08-04 LAB — BLOOD GAS, ARTERIAL
Acid-Base Excess: 2 mmol/L (ref 0.0–2.0)
Bicarbonate: 26.5 mmol/L (ref 20.0–28.0)
FIO2: 0.32
O2 Saturation: 94.3 %
Patient temperature: 37
pCO2 arterial: 40 mmHg (ref 32.0–48.0)
pH, Arterial: 7.43 (ref 7.350–7.450)
pO2, Arterial: 70 mmHg — ABNORMAL LOW (ref 83.0–108.0)

## 2018-08-04 LAB — PROCALCITONIN: Procalcitonin: 4.93 ng/mL

## 2018-08-04 LAB — COMPREHENSIVE METABOLIC PANEL
ALT: 19 U/L (ref 0–44)
AST: 48 U/L — AB (ref 15–41)
Albumin: 2.2 g/dL — ABNORMAL LOW (ref 3.5–5.0)
Alkaline Phosphatase: 80 U/L (ref 38–126)
Anion gap: 6 (ref 5–15)
BUN: 19 mg/dL (ref 8–23)
CALCIUM: 7.9 mg/dL — AB (ref 8.9–10.3)
CO2: 26 mmol/L (ref 22–32)
Chloride: 107 mmol/L (ref 98–111)
Creatinine, Ser: 1.42 mg/dL — ABNORMAL HIGH (ref 0.61–1.24)
GFR calc Af Amer: 54 mL/min — ABNORMAL LOW (ref >60)
GFR calc non Af Amer: 47 mL/min — ABNORMAL LOW (ref >60)
Glucose, Bld: 110 mg/dL — ABNORMAL HIGH (ref 70–99)
Potassium: 4.2 mmol/L (ref 3.5–5.1)
Sodium: 139 mmol/L (ref 135–145)
Total Bilirubin: 0.5 mg/dL (ref 0.3–1.2)
Total Protein: 5.9 g/dL — ABNORMAL LOW (ref 6.5–8.1)

## 2018-08-04 LAB — VITAMIN B12: Vitamin B-12: 424 pg/mL (ref 180–914)

## 2018-08-04 LAB — AMMONIA: Ammonia: 10 umol/L (ref 9–35)

## 2018-08-04 LAB — TROPONIN I: Troponin I: 0.03 ng/mL (ref <0.03)

## 2018-08-04 LAB — PHOSPHORUS: Phosphorus: 2.2 mg/dL — ABNORMAL LOW (ref 2.5–4.6)

## 2018-08-04 LAB — TSH: TSH: 2.749 u[IU]/mL (ref 0.350–4.500)

## 2018-08-04 LAB — FOLATE: Folate: 7.9 ng/mL (ref >5.9)

## 2018-08-04 LAB — LACTIC ACID, PLASMA: Lactic Acid, Venous: 1.2 mmol/L (ref 0.5–1.9)

## 2018-08-04 LAB — GLUCOSE, CAPILLARY: Glucose-Capillary: 144 mg/dL — ABNORMAL HIGH (ref 70–99)

## 2018-08-04 MED ORDER — VANCOMYCIN HCL IN DEXTROSE 1-5 GM/200ML-% IV SOLN
1000.0000 mg | INTRAVENOUS | Status: DC
  Filled 2018-08-04: qty 200

## 2018-08-04 MED ORDER — K PHOS MONO-SOD PHOS DI & MONO 155-852-130 MG PO TABS
250.0000 mg | ORAL_TABLET | Freq: Once | ORAL | Status: AC
  Administered 2018-08-04: 250 mg via ORAL
  Filled 2018-08-04: qty 1

## 2018-08-04 MED ORDER — TRAZODONE HCL 50 MG PO TABS
50.0000 mg | ORAL_TABLET | Freq: Every evening | ORAL | Status: DC | PRN
  Administered 2018-08-04: 50 mg via ORAL
  Filled 2018-08-04: qty 1

## 2018-08-04 MED ORDER — METOPROLOL TARTRATE 5 MG/5ML IV SOLN
5.0000 mg | Freq: Once | INTRAVENOUS | Status: AC
  Administered 2018-08-04: 5 mg via INTRAVENOUS
  Filled 2018-08-04: qty 5

## 2018-08-04 MED ORDER — HALOPERIDOL LACTATE 5 MG/ML IJ SOLN
INTRAMUSCULAR | Status: AC
  Filled 2018-08-04: qty 1

## 2018-08-04 MED ORDER — METOPROLOL SUCCINATE ER 100 MG PO TB24
100.0000 mg | ORAL_TABLET | Freq: Every day | ORAL | Status: DC
  Administered 2018-08-04 – 2018-08-05 (×2): 100 mg via ORAL
  Filled 2018-08-04 (×2): qty 1

## 2018-08-04 MED ORDER — VANCOMYCIN HCL IN DEXTROSE 1-5 GM/200ML-% IV SOLN
1000.0000 mg | Freq: Once | INTRAVENOUS | Status: AC
  Administered 2018-08-04: 1000 mg via INTRAVENOUS
  Filled 2018-08-04: qty 200

## 2018-08-04 MED ORDER — HALOPERIDOL LACTATE 5 MG/ML IJ SOLN
5.0000 mg | Freq: Once | INTRAMUSCULAR | Status: AC
  Administered 2018-08-04: 5 mg via INTRAVENOUS

## 2018-08-04 MED ORDER — HALOPERIDOL LACTATE 5 MG/ML IJ SOLN
5.0000 mg | Freq: Once | INTRAMUSCULAR | Status: AC
  Administered 2018-08-04: 5 mg via INTRAVENOUS
  Filled 2018-08-04: qty 1

## 2018-08-04 NOTE — Progress Notes (Addendum)
[place] at [facility]                                                                                                                                                                                  Patient Demographics   Bobby Peterson, is a 78 y.o. male, DOB - Feb 06, 1941, AJG:[id number]  Admit date - 08/02/2018   Admitting Physician Saundra Shelling, MD  Outpatient Primary MD for the patient is Carmon Ginsberg, [place]   LOS - 2  Subjective: Patient states he is feeling better this morning.  Shortness of breath has improved.  He denies any chest pain, abdominal pain, headache.  Review of Systems:   CONSTITUTIONAL: No documented fever. No fatigue, weakness. No weight gain, no weight loss.  EYES: No blurry or double vision.  ENT: No tinnitus. No postnasal drip. No redness of the oropharynx.  RESPIRATORY: No cough, no wheeze, no hemoptysis.  Dyspnea improving. CARDIOVASCULAR: No chest pain. No orthopnea. No palpitations. No syncope.  GASTROINTESTINAL: No nausea, no vomiting or diarrhea. No abdominal pain. No melena or hematochezia.  GENITOURINARY: No dysuria or hematuria.  ENDOCRINE: No polyuria or nocturia. No heat or cold intolerance.  HEMATOLOGY: No anemia. No bruising. No bleeding.  INTEGUMENTARY: No rashes. No lesions.  MUSCULOSKELETAL: No arthritis. No swelling. No gout.  NEUROLOGIC: No numbness, tingling, or ataxia. No seizure-type activity.  PSYCHIATRIC: No anxiety. No insomnia. No ADD.    Vitals:   Vitals:   08/04/18 0255 08/04/18 0338 08/04/18 0742 08/04/18 1632  BP: (!) 112/52 129/60 132/70 (!) 146/92  Pulse: (!) 121 (!) 121 (!) 123 (!) 116  Resp:  20 18 18   Temp: 98.7 F (37.1 C) 98 F (36.7 C) 97.7 F (36.5 C) 98.4 F (36.9 C)  TempSrc: Oral Oral  Oral  SpO2: 97% 95% 94% 95%  Weight:      Height:        Wt Readings from Last 3 Encounters:  08/02/18 74.8 kg  05/14/18 75.7 kg  04/30/18 78.7 kg     Intake/Output Summary (Last 24  hours) at 08/04/2018 1701 Last data filed at 08/04/2018 1337 Gross per 24 hour  Intake 1195.54 ml  Output 810 ml  Net 385.54 ml    Physical Exam:   GENERAL: Pleasant-appearing in no apparent distress.  HEENT: Atraumatic, normocephalic. Extraocular muscles are intact. Pupils equal and reactive to light. Sclerae anicteric. No conjunctival injection. No oro-pharyngeal erythema.  NECK: Supple. There is no jugular venous distention. No bruits, no lymphadenopathy, no thyromegaly.  HEART: Regular rate and rhythm,. No murmurs, no rubs, no clicks.  LUNGS: + Diffuse faint expiratory wheezing throughout. ABDOMEN: Soft, flat, nontender, nondistended. Has good  bowel sounds. No hepatosplenomegaly appreciated.  EXTREMITIES: No evidence of any cyanosis, clubbing, or peripheral edema.  +2 pedal and radial pulses bilaterally.  NEUROLOGIC: The patient is alert, awake, and oriented x3 with no focal motor or sensory deficits appreciated bilaterally.  SKIN: Moist and warm with no rashes appreciated.  Psych: Not anxious, depressed   Antibiotics   Anti-infectives (From admission, onward)   Start     Dose/Rate Route Frequency Ordered Stop   08/03/18 1600  cefTRIAXone (ROCEPHIN) 2 g in sodium chloride 0.9 % 100 mL IVPB     2 g 200 mL/hr over 30 Minutes Intravenous Every 24 hours 08/03/18 1144     08/03/18 0050  vancomycin (VANCOCIN) IVPB 750 mg/150 ml premix  Status:  Discontinued     750 mg 150 mL/hr over 60 Minutes Intravenous Every 24 hours 08/03/18 0050 08/03/18 1144   08/02/18 2200  vancomycin (VANCOCIN) IVPB 750 mg/150 ml premix  Status:  Discontinued     750 mg 150 mL/hr over 60 Minutes Intravenous Every 24 hours 08/02/18 1706 08/03/18 0050   08/02/18 2000  piperacillin-tazobactam (ZOSYN) IVPB 3.375 g  Status:  Discontinued     3.375 g 12.5 mL/hr over 240 Minutes Intravenous Every 8 hours 08/02/18 1706 08/03/18 1144   08/02/18 1245  vancomycin (VANCOCIN) IVPB 1000 mg/200 mL premix     1,000 mg 200  mL/hr over 60 Minutes Intravenous  Once 08/02/18 1239 08/02/18 1418   08/02/18 1245  piperacillin-tazobactam (ZOSYN) IVPB 3.375 g     3.375 g 100 mL/hr over 30 Minutes Intravenous  Once 08/02/18 1239 08/02/18 1347      Medications   Scheduled Meds: . aspirin EC  81 mg Oral Daily  . enoxaparin (LOVENOX) injection  40 mg Subcutaneous Q24H  . ipratropium-albuterol  3 mL Nebulization Q6H  . metoprolol succinate  100 mg Oral Daily  . multivitamin with minerals  1 tablet Oral Daily  . pantoprazole  40 mg Oral Daily  . pravastatin  20 mg Oral Daily  . protein supplement shake  11 oz Oral BID BM  . tamsulosin  0.4 mg Oral Daily  . [START ON 08/06/2018] vitamin B-12  1,000 mcg Oral Weekly   Continuous Infusions: . cefTRIAXone (ROCEPHIN)  IV 2 g (08/04/18 1500)   PRN Meds:.acetaminophen **OR** acetaminophen, guaiFENesin-dextromethorphan, ondansetron **OR** ondansetron (ZOFRAN) IV, senna-docusate   Data Review:   Micro Results Recent Results (from the past 240 hour(s))  Blood culture (routine x 2)     Status: Abnormal (Preliminary result)   Collection Time: 08/02/18 12:42 PM  Result Value Ref Range Status   Specimen Description   Final    BLOOD RIGHT ANTECUBITAL Performed at [facility], [place] [address] [place], [place] [address]    Special Requests   Final    BOTTLES DRAWN AEROBIC AND ANAEROBIC Blood Culture adequate volume Performed at [facility], [address] [place], [place] [address]    Culture  Setup Time   Final    IN BOTH AEROBIC AND ANAEROBIC BOTTLES GRAM POSITIVE COCCI CRITICAL RESULT CALLED TO, READ BACK BY AND VERIFIED WITH: DAVID BESANTI @0145  08/03/18 AKT GRAM STAIN REVIEWED-AGREE WITH RESULT    Culture STREPTOCOCCUS PNEUMONIAE (A)  Final   Report Status PENDING  Incomplete  Blood culture (routine x 2)     Status: None (Preliminary result)   Collection Time: 08/02/18 12:42 PM  Result Value Ref Range Status   Specimen Description BLOOD BLOOD  LEFT HAND  Final  Special Requests   Final    BOTTLES DRAWN AEROBIC ONLY Blood Culture results may not be optimal due to an inadequate volume of blood received in culture bottles   Culture   Final    NO GROWTH 1 DAY Performed at [facility], [address] [place], [place] [address]    Report Status PENDING  Incomplete  Blood Culture ID Panel (Reflexed)     Status: Abnormal   Collection Time: 08/02/18 12:42 PM  Result Value Ref Range Status   Enterococcus species NOT DETECTED NOT DETECTED Final   Listeria monocytogenes NOT DETECTED NOT DETECTED Final   Staphylococcus species NOT DETECTED NOT DETECTED Final   Staphylococcus aureus (BCID) NOT DETECTED NOT DETECTED Final   Streptococcus species DETECTED (A) NOT DETECTED Final    Comment: CRITICAL RESULT CALLED TO, READ BACK BY AND VERIFIED WITH: DAVID BESANTI @0145  08/03/18 AKT    Streptococcus agalactiae NOT DETECTED NOT DETECTED Final   Streptococcus pneumoniae DETECTED (A) NOT DETECTED Final    Comment: CRITICAL RESULT CALLED TO, READ BACK BY AND VERIFIED WITH: DAVID BESANT @0145  08/03/18 AKT    Streptococcus pyogenes NOT DETECTED NOT DETECTED Final   Acinetobacter baumannii NOT DETECTED NOT DETECTED Final   Enterobacteriaceae species NOT DETECTED NOT DETECTED Final   Enterobacter cloacae complex NOT DETECTED NOT DETECTED Final   Escherichia coli NOT DETECTED NOT DETECTED Final   Klebsiella oxytoca NOT DETECTED NOT DETECTED Final   Klebsiella pneumoniae NOT DETECTED NOT DETECTED Final   Proteus species NOT DETECTED NOT DETECTED Final   Serratia marcescens NOT DETECTED NOT DETECTED Final   Haemophilus influenzae NOT DETECTED NOT DETECTED Final   Neisseria meningitidis NOT DETECTED NOT DETECTED Final   Pseudomonas aeruginosa NOT DETECTED NOT DETECTED Final   Candida albicans NOT DETECTED NOT DETECTED Final   Candida glabrata NOT DETECTED NOT DETECTED Final   Candida krusei NOT DETECTED NOT DETECTED Final   Candida  parapsilosis NOT DETECTED NOT DETECTED Final   Candida tropicalis NOT DETECTED NOT DETECTED Final    Comment: Performed at [facility], [address] [place], [place] [address]  MRSA PCR Screening     Status: None   Collection Time: 08/02/18  5:05 PM  Result Value Ref Range Status   MRSA by PCR NEGATIVE NEGATIVE Final    Comment:        The GeneXpert MRSA Assay (FDA approved for NASAL specimens only), is one component of a comprehensive MRSA colonization surveillance program. It is not intended to diagnose MRSA infection nor to guide or monitor treatment for MRSA infections. Performed at [facility], [address] [place], [place] [address]     Radiology Reports Dg Chest 1 View  Result Date: 08/02/2018 CLINICAL DATA:  Worsening shortness of breath EXAM: CHEST  1 VIEW COMPARISON:  03/15/2018 FINDINGS: Cardiomegaly, vascular congestion. Focal right lower lobe airspace opacity concerning for pneumonia. Atelectasis or scarring at the left base. No effusions or acute bony abnormality. IMPRESSION: Worsening airspace opacity at the right lung base concerning for pneumonia. Cardiomegaly, vascular congestion. Left base atelectasis or scarring. Electronically Signed   By: Rolm Baptise M.D.   On: 08/02/2018 12:35     CBC Recent Labs  Lab 08/02/18 1242 08/03/18 0621 08/04/18 0351  WBC 28.1* 22.0* 17.0*  HGB 13.1 10.8* 10.7*  HCT 41.6 33.7* 33.6*  PLT 269 233 263  MCV 86.3 86.4 86.4  MCH 27.2 27.7 27.5  MCHC 31.5 32.0 31.8  RDW 16.2* 16.3* 16.5*  LYMPHSABS 0.8  --  1.0  MONOABS 0.8  --  0.5  EOSABS 0.0  --  0.2  BASOSABS 0.1  --  0.0    Chemistries  Recent Labs  Lab 08/02/18 1242 08/03/18 0621 08/03/18 2244 08/04/18 0351  NA 132* 136  --  139  K 3.2* 3.0*  --  4.2  CL 91* 99  --  107  CO2 29 27  --  26  GLUCOSE 249* 127*  --  110*  BUN 24* 25*  --  19  CREATININE 1.82* 1.74*  --  1.42*  CALCIUM 8.3* 7.8*  --  7.9*  MG  --   --  2.3  --   AST   --   --   --  48*  ALT  --   --   --  19  ALKPHOS  --   --   --  80  BILITOT  --   --   --  0.5   ------------------------------------------------------------------------------------------------------------------ estimated creatinine clearance is 39.9 mL/min (A) (by C-G formula based on SCr of 1.42 mg/dL (H)). ------------------------------------------------------------------------------------------------------------------ No results for input(s): HGBA1C in the last 72 hours. ------------------------------------------------------------------------------------------------------------------ No results for input(s): CHOL, HDL, LDLCALC, TRIG, CHOLHDL, LDLDIRECT in the last 72 hours. ------------------------------------------------------------------------------------------------------------------ Recent Labs    08/04/18 0351  TSH 2.749   ------------------------------------------------------------------------------------------------------------------ Recent Labs    08/04/18 0351  VITAMINB12 424  FOLATE 7.9    Coagulation profile No results for input(s): INR, PROTIME in the last 168 hours.  No results for input(s): DDIMER in the last 72 hours.  Cardiac Enzymes Recent Labs  Lab 08/02/18 1242 08/03/18 2244 08/04/18 0351  TROPONINI 0.03* 0.03* 0.03*   ------------------------------------------------------------------------------------------------------------------ Invalid input(s): POCBNP    Assessment & Plan   Strep pneumo bacteremia- likely secondary to CAP. Follow blood cultures -Patient already on ceftriaxone, will add Vancomycin pending blood culture susceptibilities -Repeat blood cultures today  CAP-improving, currently on 3 L O2 (uses 2 L O2 chronically at home).  Procalcitonin elevated. -Continue ceftriaxone, add vancomycin -Check strep pneumoniae urinary antigen  Acute on chronic respiratory failure with hypoxia secondary to CAP -Wean O2 to home 2L as  able  Sinus tachycardia- HRs in the 150s today.  EKG performed this afternoon with sinus tachycardia.  TSH normal. -Restart home metoprolol -IV metoprolol as needed -Cardiac monitoring  Hypophosphatemia -Replete and recheck  Chronic diastolic heart failure-stable, does not appear volume overloaded on exam. -Holding Lasix -Restart home metoprolol  BPH-stable -Continue Flomax  GERD- stable -Continue Prilosec  Hyperlipidemia- stable -Continue Pravachol  DVT prophylaxis- Lovenox     Code Status Orders  (From admission, onward)         Start     Ordered   08/02/18 1433  Do not attempt resuscitation (DNR)  Continuous    Question Answer Comment  In the event of cardiac or respiratory ARREST Do not call a "code blue"   In the event of cardiac or respiratory ARREST Do not perform Intubation, CPR, defibrillation or ACLS   In the event of cardiac or respiratory ARREST Use medication by any route, position, wound care, and other measures to relive pain and suffering. May use oxygen, suction and manual treatment of airway obstruction as needed for comfort.      08/02/18 1432        Code Status History    Date Active Date Inactive Code Status Order ID Comments User Context   02/15/2018 0150 02/16/2018 1526 Full Code [id number]  Amelia Jo, MD  Inpatient   04/21/2016 0702 04/21/2016 1703 Full Code [id number]  Holley Raring, NP ED   01/04/2015 1401 01/07/2015 1737 Full Code [id number]  Greggory Keen, MD Inpatient   01/03/2015 1503 01/04/2015 1401 Full Code [id number]  Christene Lye, MD Inpatient     Consults none  DVT Prophylaxis  Lovenox   Lab Results  Component Value Date   PLT 263 08/04/2018     Time Spent in minutes   45 minutes spent greater than 50% of time spent in care coordination and counseling patient regarding the condition and plan of care.   Berna Spare Shneur [place] M.D on 08/04/2018 at 5:01 PM  Between 7am to 6pm - Pager - [phone]  After 6pm go to  [email or web address] - Proofreader  [employer]   Office  [phone]

## 2018-08-04 NOTE — Plan of Care (Signed)
  Problem: Education: Goal: Knowledge of General Education information will improve Description Including pain rating scale, medication(s)/side effects and non-pharmacologic comfort measures Outcome: Progressing   Problem: Activity: Goal: Risk for activity intolerance will decrease Outcome: Progressing   Problem: Safety: Goal: Ability to remain free from injury will improve Outcome: Progressing   

## 2018-08-04 NOTE — Progress Notes (Signed)
Patient's daughter reported he fell a few days ago and it was untreated but patient reported abdominal and head pain.  Phillis Knack, RN

## 2018-08-04 NOTE — Consult Note (Signed)
Pharmacy Antibiotic Note  Bobby Peterson is a 78 y.o. male admitted on 08/02/2018 with bacteremia.  Pharmacy has been consulted for Vancomycin dosing.  Plan: Vancomycin 1000 IV every 24 hours.  Goal trough 15-20 mcg/mL.  Ke 0.038  t 1/2 18.24  Vd 52.5  This is a restart of vancomycin for this patient with improved kidney function from initial dosing  Will give second 1g dose 6 hours out from first as a reload and recheck VT prior to the 4th scheduled dose (anticipated Cmin 15)  Height: 5\' 5"  (165.1 cm) Weight: 165 lb (74.8 kg) IBW/kg (Calculated) : 61.5  Temp (24hrs), Avg:98.2 F (36.8 C), Min:97.7 F (36.5 C), Max:98.7 F (37.1 C)  Recent Labs  Lab 08/02/18 1242 08/02/18 1247 08/02/18 1745 08/03/18 0621 08/04/18 0351  WBC 28.1*  --   --  22.0* 17.0*  CREATININE 1.82*  --   --  1.74* 1.42*  LATICACIDVEN  --  2.39* 3.5*  --  1.2    Estimated Creatinine Clearance: 39.9 mL/min (A) (by C-G formula based on SCr of 1.42 mg/dL (H)).    Allergies  Allergen Reactions  . Fenofibrate     Other reaction(s): Headache  . Lisinopril Cough  . Niacin     Other reaction(s): Dizziness  . Phenergan [Promethazine Hcl]     NVD  . Simvastatin     Other reaction(s): Headache    Antimicrobials this admission: Zosyn 1/2 >> Vancomycin 1/2 >>1/3  Vancomycin 1/4 >>  CTX 1/3 >>   Dose adjustments this admission: Original Vanc dosing was 750mg  q 24h  Microbiology results: 1/2 BCx: Streptococcus pneumoniae  1/2 MRSA PCR: neg  Thank you for allowing pharmacy to be a part of this patient's care.  Lu Duffel, PharmD, BCPS Clinical Pharmacist 08/04/2018 10:05 PM

## 2018-08-05 LAB — CULTURE, BLOOD (ROUTINE X 2): Special Requests: ADEQUATE

## 2018-08-05 LAB — CBC
HCT: 36.3 % — ABNORMAL LOW (ref 39.0–52.0)
Hemoglobin: 11.3 g/dL — ABNORMAL LOW (ref 13.0–17.0)
MCH: 27.4 pg (ref 26.0–34.0)
MCHC: 31.1 g/dL (ref 30.0–36.0)
MCV: 88.1 fL (ref 80.0–100.0)
Platelets: 309 10*3/uL (ref 150–400)
RBC: 4.12 MIL/uL — ABNORMAL LOW (ref 4.22–5.81)
RDW: 16.6 % — ABNORMAL HIGH (ref 11.5–15.5)
WBC: 13.2 10*3/uL — AB (ref 4.0–10.5)
nRBC: 0 % (ref 0.0–0.2)

## 2018-08-05 LAB — BASIC METABOLIC PANEL
Anion gap: 7 (ref 5–15)
BUN: 14 mg/dL (ref 8–23)
CO2: 26 mmol/L (ref 22–32)
Calcium: 8.4 mg/dL — ABNORMAL LOW (ref 8.9–10.3)
Chloride: 105 mmol/L (ref 98–111)
Creatinine, Ser: 1.2 mg/dL (ref 0.61–1.24)
GFR calc Af Amer: >60 mL/min (ref >60)
GFR, EST NON AFRICAN AMERICAN: 57 mL/min — AB (ref >60)
Glucose, Bld: 83 mg/dL (ref 70–99)
Potassium: 3.9 mmol/L (ref 3.5–5.1)
Sodium: 138 mmol/L (ref 135–145)

## 2018-08-05 LAB — PHOSPHORUS: Phosphorus: 3.8 mg/dL (ref 2.5–4.6)

## 2018-08-05 LAB — PROCALCITONIN: Procalcitonin: 2.65 ng/mL

## 2018-08-05 MED ORDER — ENOXAPARIN SODIUM 40 MG/0.4ML ~~LOC~~ SOLN
40.0000 mg | SUBCUTANEOUS | Status: DC
  Administered 2018-08-05: 40 mg via SUBCUTANEOUS
  Filled 2018-08-05: qty 0.4

## 2018-08-05 MED ORDER — TRAZODONE HCL 50 MG PO TABS: 50.0000 mg | ORAL_TABLET | Freq: Every day | ORAL | Status: DC

## 2018-08-05 MED ORDER — METOPROLOL TARTRATE 25 MG PO TABS
25.0000 mg | ORAL_TABLET | Freq: Once | ORAL | Status: AC
  Administered 2018-08-05: 25 mg via ORAL
  Filled 2018-08-05: qty 1

## 2018-08-05 MED ORDER — TRAZODONE HCL 100 MG PO TABS
100.0000 mg | ORAL_TABLET | Freq: Every day | ORAL | Status: DC
  Administered 2018-08-05: 100 mg via ORAL
  Filled 2018-08-05: qty 1

## 2018-08-05 MED ORDER — LEVALBUTEROL HCL 1.25 MG/0.5ML IN NEBU
1.2500 mg | INHALATION_SOLUTION | Freq: Four times a day (QID) | RESPIRATORY_TRACT | Status: DC
  Administered 2018-08-05 (×2): 1.25 mg via RESPIRATORY_TRACT
  Filled 2018-08-05 (×6): qty 0.5

## 2018-08-05 MED ORDER — IPRATROPIUM-ALBUTEROL 0.5-2.5 (3) MG/3ML IN SOLN
3.0000 mL | RESPIRATORY_TRACT | Status: DC
  Administered 2018-08-05: 3 mL via RESPIRATORY_TRACT
  Filled 2018-08-05: qty 3

## 2018-08-05 MED ORDER — VITAMIN B-12 1000 MCG PO TABS
1000.0000 ug | ORAL_TABLET | ORAL | Status: DC
  Administered 2018-08-06: 1000 ug via ORAL
  Filled 2018-08-05: qty 1

## 2018-08-05 NOTE — Progress Notes (Signed)
[place] at [facility]                                                                                                                                                                                  Patient Demographics   Bobby Peterson, is a 78 y.o. male, DOB - 1940-10-19, SAY:[id number]  Admit date - 08/02/2018   Admitting Physician Saundra Shelling, MD  Outpatient Primary MD for the patient is Carmon Ginsberg, [place]   LOS - 3  Subjective: Patient states he is doing fine this morning.  No concerns.  No shortness of breath.  He has been up to the bathroom a couple of times without any dyspnea on exertion.  Per wife, he has seemed a little bit confused overnight.  Review of Systems:   CONSTITUTIONAL: No documented fever. No fatigue, weakness. No weight gain, no weight loss.  EYES: No blurry or double vision.  ENT: No tinnitus. No postnasal drip. No redness of the oropharynx.  RESPIRATORY: No cough, no wheeze, no hemoptysis.  No shortness of breath. CARDIOVASCULAR: No chest pain. No orthopnea. No palpitations. No syncope.  GASTROINTESTINAL: No nausea, no vomiting or diarrhea. No abdominal pain. No melena or hematochezia.  GENITOURINARY: No dysuria or hematuria.  ENDOCRINE: No polyuria or nocturia. No heat or cold intolerance.  HEMATOLOGY: No anemia. No bruising. No bleeding.  INTEGUMENTARY: No rashes. No lesions.  MUSCULOSKELETAL: No arthritis. No swelling. No gout.  NEUROLOGIC: No numbness, tingling, or ataxia. No seizure-type activity.  PSYCHIATRIC: No anxiety. No insomnia. No ADD.    Vitals:   Vitals:   08/04/18 1632 08/04/18 2013 08/05/18 0440 08/05/18 0759  BP: (!) 146/92 113/76 135/86 (!) 150/79  Pulse: (!) 116 (!) 115 (!) 114 93  Resp: 18 18    Temp: 98.4 F (36.9 C) 98.5 F (36.9 C) 98.4 F (36.9 C) (!) 97.5 F (36.4 C)  TempSrc: Oral Oral Oral Oral  SpO2: 95% 98% 100% 96%  Weight:      Height:        Wt Readings from Last 3 Encounters:   08/02/18 74.8 kg  05/14/18 75.7 kg  04/30/18 78.7 kg     Intake/Output Summary (Last 24 hours) at 08/05/2018 1418 Last data filed at 08/04/2018 1838 Gross per 24 hour  Intake 0 ml  Output 50 ml  Net -50 ml    Physical Exam:   GENERAL: Well-appearing in no apparent distress.  Sitting up on edge of bed. HEENT: Atraumatic, normocephalic. Extraocular muscles are intact. Pupils equal and reactive to light. Sclerae anicteric. No conjunctival injection. No oro-pharyngeal erythema.  NECK: Supple. There is no jugular venous distention. No bruits, no lymphadenopathy,  no thyromegaly.  HEART: Regular rate and rhythm,. No murmurs, no rubs, no clicks.  LUNGS: CTAB, no wheezing or crackles, normal work of breathing, nasal cannula in place. ABDOMEN: Soft, flat, nontender, nondistended. Has good bowel sounds. No hepatosplenomegaly appreciated.  EXTREMITIES: No evidence of any cyanosis, clubbing, or peripheral edema.  +2 pedal and radial pulses bilaterally.  NEUROLOGIC: The patient is alert, awake, and oriented x3 with no focal motor or sensory deficits appreciated bilaterally.  SKIN: Moist and warm with no rashes appreciated.  Psych: Not anxious, depressed   Antibiotics   Anti-infectives (From admission, onward)   Start     Dose/Rate Route Frequency Ordered Stop   08/05/18 0400  vancomycin (VANCOCIN) IVPB 1000 mg/200 mL premix  Status:  Discontinued     1,000 mg 200 mL/hr over 60 Minutes Intravenous Every 24 hours 08/04/18 2148 08/05/18 0658   08/04/18 1745  vancomycin (VANCOCIN) IVPB 1000 mg/200 mL premix     1,000 mg 200 mL/hr over 60 Minutes Intravenous  Once 08/04/18 1730 08/04/18 2233   08/03/18 1600  cefTRIAXone (ROCEPHIN) 2 g in sodium chloride 0.9 % 100 mL IVPB     2 g 200 mL/hr over 30 Minutes Intravenous Every 24 hours 08/03/18 1144     08/03/18 0050  vancomycin (VANCOCIN) IVPB 750 mg/150 ml premix  Status:  Discontinued     750 mg 150 mL/hr over 60 Minutes Intravenous Every 24 hours  08/03/18 0050 08/03/18 1144   08/02/18 2200  vancomycin (VANCOCIN) IVPB 750 mg/150 ml premix  Status:  Discontinued     750 mg 150 mL/hr over 60 Minutes Intravenous Every 24 hours 08/02/18 1706 08/03/18 0050   08/02/18 2000  piperacillin-tazobactam (ZOSYN) IVPB 3.375 g  Status:  Discontinued     3.375 g 12.5 mL/hr over 240 Minutes Intravenous Every 8 hours 08/02/18 1706 08/03/18 1144   08/02/18 1245  vancomycin (VANCOCIN) IVPB 1000 mg/200 mL premix     1,000 mg 200 mL/hr over 60 Minutes Intravenous  Once 08/02/18 1239 08/02/18 1418   08/02/18 1245  piperacillin-tazobactam (ZOSYN) IVPB 3.375 g     3.375 g 100 mL/hr over 30 Minutes Intravenous  Once 08/02/18 1239 08/02/18 1347      Medications   Scheduled Meds: . aspirin EC  81 mg Oral Daily  . enoxaparin (LOVENOX) injection  40 mg Subcutaneous Q24H  . levalbuterol  1.25 mg Nebulization Q6H  . metoprolol succinate  100 mg Oral Daily  . multivitamin with minerals  1 tablet Oral Daily  . pantoprazole  40 mg Oral Daily  . pravastatin  20 mg Oral Daily  . protein supplement shake  11 oz Oral BID BM  . tamsulosin  0.4 mg Oral Daily  . traZODone  100 mg Oral QHS  . [START ON 08/06/2018] vitamin B-12  1,000 mcg Oral Weekly   Continuous Infusions: . cefTRIAXone (ROCEPHIN)  IV 2 g (08/04/18 1500)   PRN Meds:.acetaminophen **OR** acetaminophen, guaiFENesin-dextromethorphan, ondansetron **OR** ondansetron (ZOFRAN) IV, senna-docusate   Data Review:   Micro Results Recent Results (from the past 240 hour(s))  Blood culture (routine x 2)     Status: Abnormal   Collection Time: 08/02/18 12:42 PM  Result Value Ref Range Status   Specimen Description   Final    BLOOD RIGHT ANTECUBITAL Performed at [facility], [place] [address], [place], [place] [address]    Special Requests   Final    BOTTLES DRAWN AEROBIC AND ANAEROBIC Blood Culture adequate volume Performed at  [facility], [address] [place], [place] [address]     Culture  Setup Time   Final    IN BOTH AEROBIC AND ANAEROBIC BOTTLES GRAM POSITIVE COCCI CRITICAL RESULT CALLED TO, READ BACK BY AND VERIFIED WITH: DAVID BESANTI @0145  08/03/18 AKT GRAM STAIN REVIEWED-AGREE WITH RESULT    Culture STREPTOCOCCUS PNEUMONIAE (A)  Final   Report Status 08/05/2018 FINAL  Final   Organism ID, Bacteria STREPTOCOCCUS PNEUMONIAE  Final      Susceptibility   Streptococcus pneumoniae - MIC*    ERYTHROMYCIN <=0.12 SENSITIVE Sensitive     LEVOFLOXACIN 0.5 SENSITIVE Sensitive     VANCOMYCIN 0.5 SENSITIVE Sensitive     PENICILLIN (non-meningitis) <=0.06 SENSITIVE Sensitive     CEFTRIAXONE (non-meningitis) <=0.12 SENSITIVE Sensitive     * STREPTOCOCCUS PNEUMONIAE  Blood culture (routine x 2)     Status: None (Preliminary result)   Collection Time: 08/02/18 12:42 PM  Result Value Ref Range Status   Specimen Description BLOOD BLOOD LEFT HAND  Final   Special Requests   Final    BOTTLES DRAWN AEROBIC ONLY Blood Culture results may not be optimal due to an inadequate volume of blood received in culture bottles   Culture   Final    NO GROWTH 2 DAYS Performed at [facility], [address] [place], [place] [address]    Report Status PENDING  Incomplete  Blood Culture ID Panel (Reflexed)     Status: Abnormal   Collection Time: 08/02/18 12:42 PM  Result Value Ref Range Status   Enterococcus species NOT DETECTED NOT DETECTED Final   Listeria monocytogenes NOT DETECTED NOT DETECTED Final   Staphylococcus species NOT DETECTED NOT DETECTED Final   Staphylococcus aureus (BCID) NOT DETECTED NOT DETECTED Final   Streptococcus species DETECTED (A) NOT DETECTED Final    Comment: CRITICAL RESULT CALLED TO, READ BACK BY AND VERIFIED WITH: DAVID BESANTI @0145  08/03/18 AKT    Streptococcus agalactiae NOT DETECTED NOT DETECTED Final   Streptococcus pneumoniae DETECTED (A) NOT DETECTED Final    Comment: CRITICAL RESULT CALLED TO, READ BACK BY AND VERIFIED WITH: DAVID  BESANT @0145  08/03/18 AKT    Streptococcus pyogenes NOT DETECTED NOT DETECTED Final   Acinetobacter baumannii NOT DETECTED NOT DETECTED Final   Enterobacteriaceae species NOT DETECTED NOT DETECTED Final   Enterobacter cloacae complex NOT DETECTED NOT DETECTED Final   Escherichia coli NOT DETECTED NOT DETECTED Final   Klebsiella oxytoca NOT DETECTED NOT DETECTED Final   Klebsiella pneumoniae NOT DETECTED NOT DETECTED Final   Proteus species NOT DETECTED NOT DETECTED Final   Serratia marcescens NOT DETECTED NOT DETECTED Final   Haemophilus influenzae NOT DETECTED NOT DETECTED Final   Neisseria meningitidis NOT DETECTED NOT DETECTED Final   Pseudomonas aeruginosa NOT DETECTED NOT DETECTED Final   Candida albicans NOT DETECTED NOT DETECTED Final   Candida glabrata NOT DETECTED NOT DETECTED Final   Candida krusei NOT DETECTED NOT DETECTED Final   Candida parapsilosis NOT DETECTED NOT DETECTED Final   Candida tropicalis NOT DETECTED NOT DETECTED Final    Comment: Performed at [facility], [address] [place],  [address]  MRSA PCR Screening     Status: None   Collection Time: 08/02/18  5:05 PM  Result Value Ref Range Status   MRSA by PCR NEGATIVE NEGATIVE Final    Comment:        The GeneXpert MRSA Assay (FDA approved for NASAL specimens only), is one component of a comprehensive  MRSA colonization surveillance program. It is not intended to diagnose MRSA infection nor to guide or monitor treatment for MRSA infections. Performed at [facility], [address] [place], [place] [address]   CULTURE, BLOOD (ROUTINE X 2) w Reflex to ID Panel     Status: None (Preliminary result)   Collection Time: 08/04/18  7:04 PM  Result Value Ref Range Status   Specimen Description BLOOD BLOOD RIGHT WRIST  Final   Special Requests   Final    BOTTLES DRAWN AEROBIC AND ANAEROBIC Blood Culture adequate volume   Culture   Final    NO GROWTH < 12 HOURS Performed at  [facility], [address] [place], [place] [address]    Report Status PENDING  Incomplete  CULTURE, BLOOD (ROUTINE X 2) w Reflex to ID Panel     Status: None (Preliminary result)   Collection Time: 08/04/18  7:12 PM  Result Value Ref Range Status   Specimen Description BLOOD BLOOD LEFT WRIST  Final   Special Requests   Final    BOTTLES DRAWN AEROBIC AND ANAEROBIC Blood Culture adequate volume   Culture   Final    NO GROWTH < 12 HOURS Performed at [facility], [address], [place], [place] [address]    Report Status PENDING  Incomplete    Radiology Reports Dg Chest 1 View  Result Date: 08/02/2018 CLINICAL DATA:  Worsening shortness of breath EXAM: CHEST  1 VIEW COMPARISON:  03/15/2018 FINDINGS: Cardiomegaly, vascular congestion. Focal right lower lobe airspace opacity concerning for pneumonia. Atelectasis or scarring at the left base. No effusions or acute bony abnormality. IMPRESSION: Worsening airspace opacity at the right lung base concerning for pneumonia. Cardiomegaly, vascular congestion. Left base atelectasis or scarring. Electronically Signed   By: Rolm Baptise M.D.   On: 08/02/2018 12:35     CBC Recent Labs  Lab 08/02/18 1242 08/03/18 0621 08/04/18 0351 08/05/18 0646  WBC 28.1* 22.0* 17.0* 13.2*  HGB 13.1 10.8* 10.7* 11.3*  HCT 41.6 33.7* 33.6* 36.3*  PLT 269 233 263 309  MCV 86.3 86.4 86.4 88.1  MCH 27.2 27.7 27.5 27.4  MCHC 31.5 32.0 31.8 31.1  RDW 16.2* 16.3* 16.5* 16.6*  LYMPHSABS 0.8  --  1.0  --   MONOABS 0.8  --  0.5  --   EOSABS 0.0  --  0.2  --   BASOSABS 0.1  --  0.0  --     Chemistries  Recent Labs  Lab 08/02/18 1242 08/03/18 0621 08/03/18 2244 08/04/18 0351 08/05/18 0646  NA 132* 136  --  139 138  K 3.2* 3.0*  --  4.2 3.9  CL 91* 99  --  107 105  CO2 29 27  --  26 26  GLUCOSE 249* 127*  --  110* 83  BUN 24* 25*  --  19 14  CREATININE 1.82* 1.74*  --  1.42* 1.20  CALCIUM 8.3* 7.8*  --  7.9* 8.4*  MG  --   --  2.3   --   --   AST  --   --   --  48*  --   ALT  --   --   --  19  --   ALKPHOS  --   --   --  80  --   BILITOT  --   --   --  0.5  --    ------------------------------------------------------------------------------------------------------------------ estimated creatinine clearance is 47.2 mL/min (by C-G formula based  on SCr of 1.2 mg/dL). ------------------------------------------------------------------------------------------------------------------ No results for input(s): HGBA1C in the last 72 hours. ------------------------------------------------------------------------------------------------------------------ No results for input(s): CHOL, HDL, LDLCALC, TRIG, CHOLHDL, LDLDIRECT in the last 72 hours. ------------------------------------------------------------------------------------------------------------------ Recent Labs    08/04/18 0351  TSH 2.749   ------------------------------------------------------------------------------------------------------------------ Recent Labs    08/04/18 0351  VITAMINB12 424  FOLATE 7.9    Coagulation profile No results for input(s): INR, PROTIME in the last 168 hours.  No results for input(s): DDIMER in the last 72 hours.  Cardiac Enzymes Recent Labs  Lab 08/02/18 1242 08/03/18 2244 08/04/18 0351  TROPONINI 0.03* 0.03* 0.03*   ------------------------------------------------------------------------------------------------------------------ Invalid input(s): POCBNP    Assessment & Plan   Strep pneumoniae bacteremia- likely secondary to CAP. -Continue ceftriaxone per blood culture susceptibilities -Blood cultures repeated 1/4  CAP-improving, currently on 3 L O2 (uses 2 L O2 chronically at home).  Procalcitonin improving. -Continue ceftriaxone -Strep pneumoniae urinary antigen pending  Acute on chronic respiratory failure with hypoxia secondary to CAP- improving. -Wean O2 to home 2L as able  Sinus tachycardia- Heart  rates improved this morning.  Currently in the 90s.  TSH normal. -Continue home metoprolol -IV metoprolol as needed -Switch duo nebs to xopenex nebs -Cardiac monitoring  Chronic diastolic heart failure-stable, does not appear volume overloaded on exam. -Holding Lasix -Continue home metoprolol  BPH-stable -Continue Flomax  GERD- stable -Continue Prilosec  Hyperlipidemia- stable -Continue Pravachol  DVT prophylaxis- Lovenox     Code Status Orders  (From admission, onward)         Start     Ordered   08/02/18 1433  Do not attempt resuscitation (DNR)  Continuous    Question Answer Comment  In the event of cardiac or respiratory ARREST Do not call a "code blue"   In the event of cardiac or respiratory ARREST Do not perform Intubation, CPR, defibrillation or ACLS   In the event of cardiac or respiratory ARREST Use medication by any route, position, wound care, and other measures to relive pain and suffering. May use oxygen, suction and manual treatment of airway obstruction as needed for comfort.      08/02/18 1432        Code Status History    Date Active Date Inactive Code Status Order ID Comments User Context   02/15/2018 0150 02/16/2018 1526 Full Code [id number]  Amelia Jo, MD Inpatient   04/21/2016 0702 04/21/2016 1703 Full Code [id number]  Holley Raring, NP ED   01/04/2015 1401 01/07/2015 1737 Full Code [id number]  Greggory Keen, MD Inpatient   01/03/2015 1503 01/04/2015 1401 Full Code [id number]  Christene Lye, MD Inpatient     Consults none  DVT Prophylaxis  Lovenox   Lab Results  Component Value Date   PLT 309 08/05/2018     Time Spent in minutes   40 minutes spent greater than 50% of time spent in care coordination and counseling patient regarding the condition and plan of care.   Berna Spare Shakthi Scipio M.D on 08/05/2018 at 2:18 PM  Between 7am to 6pm - Pager [phone]  After 6pm go to [email or web address] - Proofreader  [employer]   Office   [phone]

## 2018-08-05 NOTE — Plan of Care (Signed)
  Problem: Activity: Goal: Risk for activity intolerance will decrease Outcome: Progressing   Problem: Safety: Goal: Ability to remain free from injury will improve Outcome: Progressing   

## 2018-08-05 NOTE — Progress Notes (Addendum)
Pt HR is up at 110-126 and have wheezing. Notify prime. Will continue to monitor.  Update 0515. Dr. Aliene Altes ordered 25 mg tablet oral once and change the duoneb from every 4 hours to 6 hours schedule. Will continue to monitor.

## 2018-08-06 LAB — CBC
HCT: 37.2 % — ABNORMAL LOW (ref 39.0–52.0)
Hemoglobin: 11.5 g/dL — ABNORMAL LOW (ref 13.0–17.0)
MCH: 27.2 pg (ref 26.0–34.0)
MCHC: 30.9 g/dL (ref 30.0–36.0)
MCV: 87.9 fL (ref 80.0–100.0)
Platelets: 384 10*3/uL (ref 150–400)
RBC: 4.23 MIL/uL (ref 4.22–5.81)
RDW: 16.6 % — ABNORMAL HIGH (ref 11.5–15.5)
WBC: 13.9 10*3/uL — ABNORMAL HIGH (ref 4.0–10.5)
nRBC: 0 % (ref 0.0–0.2)

## 2018-08-06 LAB — BASIC METABOLIC PANEL
Anion gap: 9 (ref 5–15)
BUN: 17 mg/dL (ref 8–23)
CO2: 26 mmol/L (ref 22–32)
CREATININE: 1.23 mg/dL (ref 0.61–1.24)
Calcium: 8.6 mg/dL — ABNORMAL LOW (ref 8.9–10.3)
Chloride: 105 mmol/L (ref 98–111)
GFR calc Af Amer: >60 mL/min (ref >60)
GFR calc non Af Amer: 55 mL/min — ABNORMAL LOW (ref >60)
Glucose, Bld: 105 mg/dL — ABNORMAL HIGH (ref 70–99)
Potassium: 3.8 mmol/L (ref 3.5–5.1)
SODIUM: 140 mmol/L (ref 135–145)

## 2018-08-06 LAB — HIV ANTIBODY (ROUTINE TESTING W REFLEX): HIV Screen 4th Generation wRfx: NONREACTIVE

## 2018-08-06 LAB — RPR: RPR Ser Ql: NONREACTIVE

## 2018-08-06 MED ORDER — PREDNISONE 50 MG PO TABS: 50.0000 mg | ORAL_TABLET | Freq: Every day | ORAL | 0 refills | Status: DC

## 2018-08-06 MED ORDER — CEFDINIR 300 MG PO CAPS: 300.0000 mg | ORAL_CAPSULE | Freq: Two times a day (BID) | ORAL | 0 refills | Status: AC

## 2018-08-06 MED ORDER — METOPROLOL SUCCINATE ER 50 MG PO TB24: 150.0000 mg | ORAL_TABLET | Freq: Every day | ORAL | 0 refills | Status: DC

## 2018-08-06 MED ORDER — METOPROLOL SUCCINATE ER 100 MG PO TB24
150.0000 mg | ORAL_TABLET | Freq: Every day | ORAL | Status: DC
  Administered 2018-08-06: 150 mg via ORAL
  Filled 2018-08-06: qty 2

## 2018-08-06 NOTE — Progress Notes (Signed)
Patient discharged to home with spouse.  Spouse and patient verbalize understanding of discharge instructions.  Tele and IV d/c'd prior to discharge.  Spouse states possession of all belongings.

## 2018-08-06 NOTE — Discharge Summary (Signed)
[facility] NAME: Bobby Peterson    MR#:  [id number]  DATE OF BIRTH:  07/07/1941  DATE OF ADMISSION:  08/02/2018   ADMITTING PHYSICIAN: Bobby Shelling, MD  DATE OF DISCHARGE: 08/06/2018 12:35 PM  PRIMARY CARE PHYSICIAN: Bobby Ginsberg, PA   ADMISSION DIAGNOSIS:  Lactic acidosis [E87.2] Community acquired pneumonia of right lower lobe of lung ([place]) [J18.1] DISCHARGE DIAGNOSIS:  Active Problems:   Pneumonia  SECONDARY DIAGNOSIS:   Past Medical History:  Diagnosis Date  . Acute respiratory failure with hypoxia ([place]) 12/03/2013   Overview:  Overview:  2 L O2 continuously  Last Assessment & Plan:  Continue 2 L O2 continuously   . Atrial tachycardia ([place]) 04/21/2016  . 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 BP (high blood pressure) 07/08/2015  . Chronic diastolic CHF (congestive heart failure) ([place])   . Chronic diastolic heart failure ([place]) 07/08/2015  . Chronic hypoxemic respiratory failure ([place]) 12/03/2013   2 L O2 continuously   . Chronic respiratory failure (H[facility])    a. on home O2  . COPD (chronic obstructive pulmonary disease) ([place])   . COPD (chronic obstructive pulmonary disease) ([place]) 12/03/2013   May fifth 2015 simple spirometry>> ratio 45%, FEV1 0.95 L (34% per day)   . Coronary artery disease   . Coronary atherosclerosis 07/08/2015   Dr. Ubaldo Peterson   . Esophageal reflux 07/08/2015  . Hernia, umbilical   . Hypercholesteremia   . Hypertension   . Obesity   . Renal cancer ([place])    a. s/p nephrectomy in 1980's  . Smoking 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 pack years 05/10/2016   HOSPITAL COURSE:   Bobby Peterson is a 78 year old male who presented to the ED with cough, congestion, and fevers.  In the ED, he was found to have an opacity in his right lung base concerning for pneumonia.  He was admitted for further management.  CAP- improved.  Stable on his home 2 L O2 prior to discharge. -Treated with ceftriaxone and azithromycin IV, then transitioned to  [facility] for a total 7-day course. -Given a 5-day course of prednisone  Strep pneumoniae bacteremia- likely secondary to CAP. -Blood cultures 1/2 growing strep pneumoniae -Repeat blood cultures 1/4 with no growth -Antibiotics as above  Acute on chronic respiratory failure with hypoxia secondary to CAP and COPD- improved -Patient weaned back to home 2 L O2 prior to discharge  Sinus tachycardia- had some elevated heart rates this admission.  Likely related to above. -TSH normal -Metoprolol dose increased from 100 mg daily to 150 mg daily -Switched duo nebs to xopenex nebs  Chronic diastolic heart failure- stable, does not appear volume overloaded on exam. -Lasix held during admission, but was restarted on discharge -Metoprolol dose increased as above  BPH-stable -Continued Flomax  GERD- stable -Continued Prilosec  Hyperlipidemia- stable -Continued Pravachol  DISCHARGE CONDITIONS:  Community-acquired pneumonia Chronic respiratory failure secondary to COPD Sinus tachycardia Chronic diastolic heart failure BPH GERD Hyperlipidemia CONSULTS OBTAINED:  None DRUG ALLERGIES:   Allergies  Allergen Reactions  . Fenofibrate     Other reaction(s): Headache  . Lisinopril Cough  . Niacin     Other reaction(s): Dizziness  . Phenergan [Promethazine Hcl]     NVD  . Simvastatin     Other reaction(s): Headache   DISCHARGE MEDICATIONS:   Allergies as of 08/06/2018      Reactions   Fenofibrate    Other reaction(s): Headache   Lisinopril Cough   Niacin  Other reaction(s): Dizziness   Phenergan [promethazine Hcl]    NVD   Simvastatin    Other reaction(s): Headache      Medication List    TAKE these medications   albuterol 108 (90 Base) MCG/ACT inhaler Commonly known as:  PROVENTIL HFA;VENTOLIN HFA Inhale 2 puffs into the lungs every 4 (four) hours as needed for wheezing or shortness of breath.   aspirin 81 MG tablet Take 81 mg by mouth daily.   cefdinir 300  MG capsule Commonly known as:  OMNICEF Take 1 capsule (300 mg total) by mouth 2 (two) times daily for 4 days.   Fluticasone-Salmeterol 250-50 MCG/DOSE Aepb Commonly known as:  ADVAIR INHALE 1 PUFF TWICE DAILY   furosemide 40 MG tablet Commonly known as:  LASIX TAKE 1 TABLET EVERY DAY   gabapentin 300 MG capsule Commonly known as:  NEURONTIN TAKE 1 CAPSULE TWICE DAILY   guaiFENesin-dextromethorphan 100-10 MG/5ML syrup Commonly known as:  ROBITUSSIN DM Take 5 mLs by mouth every 4 (four) hours as needed for cough.   metoprolol succinate 50 MG 24 hr tablet Commonly known as:  TOPROL-XL Take 3 tablets (150 mg total) by mouth daily. Take with or immediately following a meal. What changed:    medication strength  See the new instructions.   omeprazole 40 MG capsule Commonly known as:  PRILOSEC TAKE 1 CAPSULE DAILY BEFORE BREAKFAST.   OXYGEN Inhale 2 L/min into the lungs continuous.   pravastatin 20 MG tablet Commonly known as:  PRAVACHOL TAKE 1 TABLET EVERY DAY   predniSONE 50 MG tablet Commonly known as:  DELTASONE Take 1 tablet (50 mg total) by mouth daily with breakfast.   tamsulosin 0.4 MG Caps capsule Commonly known as:  FLOMAX TAKE 1 CAPSULE EVERY DAY   vitamin B-12 1000 MCG tablet Commonly known as:  CYANOCOBALAMIN Take 1,000 mcg by mouth once a week. *Take on Monday*        DISCHARGE INSTRUCTIONS:  1.  Follow-up with PCP in 5 days 2.  Metoprolol dose increased to 150 mg daily 3.  Continue Omnicef twice daily for 4 more days 4.  Continue prednisone for total 5-day course DIET:  Cardiac diet DISCHARGE CONDITION:  Stable ACTIVITY:  Activity as tolerated OXYGEN:  Home Oxygen: Yes.    Oxygen Delivery: 2 liters/min via Patient connected to nasal cannula oxygen DISCHARGE LOCATION:  home   If you experience worsening of your admission symptoms, develop shortness of breath, life threatening emergency, suicidal or homicidal thoughts you must seek  medical attention immediately by calling 911 or calling your MD immediately  if symptoms less severe.  You Must read complete instructions/literature along with all the possible adverse reactions/side effects for all the Medicines you take and that have been prescribed to you. Take any new Medicines after you have completely understood and accpet all the possible adverse reactions/side effects.   Please note  You were cared for by a hospitalist during your hospital stay. If you have any questions about your discharge medications or the care you received while you were in the hospital after you are discharged, you can call the unit and asked to speak with the hospitalist on call if the hospitalist that took care of you is not available. Once you are discharged, your primary care physician will handle any further medical issues. Please note that NO REFILLS for any discharge medications will be authorized once you are discharged, as it is imperative that you return to your primary care physician (or  establish a relationship with a primary care physician if you do not have one) for your aftercare needs so that they can reassess your need for medications and monitor your lab values.    On the day of Discharge:  VITAL SIGNS:  Blood pressure 122/76, pulse (!) 128, temperature 97.8 F (36.6 C), temperature source Oral, resp. rate 18, height 5\' 5"  (1.651 m), weight 74.8 kg, SpO2 98 %. PHYSICAL EXAMINATION:  GENERAL: Well-appearing in no apparent distress.  Sitting up on edge of bed. HEENT: Atraumatic, normocephalic. Extraocular muscles are intact. Pupils equal and reactive to light. Sclerae anicteric. No conjunctival injection. No oro-pharyngeal erythema.  NECK: Supple. There is no jugular venous distention. No bruits, no lymphadenopathy, no thyromegaly.  HEART: Regular rate and rhythm,. No murmurs, no rubs, no clicks.  LUNGS: + Faint generalized expiratory wheezing present, normal work of breathing, nasal  cannula in place. ABDOMEN: Soft, flat, nontender, nondistended. Has good bowel sounds. No hepatosplenomegaly appreciated.  EXTREMITIES: No evidence of any cyanosis, clubbing, or peripheral edema.  +2 pedal and radial pulses bilaterally.  NEUROLOGIC: The patient is alert, awake, and oriented x3 with no focal motor or sensory deficits appreciated bilaterally.  SKIN: Moist and warm with no rashes appreciated.  Psych: Not anxious, depressed DATA REVIEW:   CBC Recent Labs  Lab 08/06/18 0707  WBC 13.9*  HGB 11.5*  HCT 37.2*  PLT 384    Chemistries  Recent Labs  Lab 08/03/18 2244 08/04/18 0351  08/06/18 0707  NA  --  139   < > 140  K  --  4.2   < > 3.8  CL  --  107   < > 105  CO2  --  26   < > 26  GLUCOSE  --  110*   < > 105*  BUN  --  19   < > 17  CREATININE  --  1.42*   < > 1.23  CALCIUM  --  7.9*   < > 8.6*  MG 2.3  --   --   --   AST  --  48*  --   --   ALT  --  19  --   --   ALKPHOS  --  80  --   --   BILITOT  --  0.5  --   --    < > = values in this interval not displayed.     Microbiology Results  Results for orders placed or performed during the hospital encounter of 08/02/18  Blood culture (routine x 2)     Status: Abnormal   Collection Time: 08/02/18 12:42 PM  Result Value Ref Range Status   Specimen Description   Final    BLOOD RIGHT ANTECUBITAL Performed at [facility], [place] [address] [place], [place] [address]    Special Requests   Final    BOTTLES DRAWN AEROBIC AND ANAEROBIC Blood Culture adequate volume Performed at [facility], [address] [place], [place] [address]    Culture  Setup Time   Final    IN BOTH AEROBIC AND ANAEROBIC BOTTLES GRAM POSITIVE COCCI CRITICAL RESULT CALLED TO, READ BACK BY AND VERIFIED WITH: DAVID BESANTI @0145  08/03/18 [facility] GRAM STAIN REVIEWED-AGREE WITH RESULT    Culture STREPTOCOCCUS PNEUMONIAE (A)  Final   Report Status 08/05/2018 FINAL  Final   Organism ID, Bacteria STREPTOCOCCUS PNEUMONIAE  Final        Susceptibility   Streptococcus pneumoniae - MIC*  ERYTHROMYCIN <=0.12 SENSITIVE Sensitive     LEVOFLOXACIN 0.5 SENSITIVE Sensitive     VANCOMYCIN 0.5 SENSITIVE Sensitive     PENICILLIN (non-meningitis) <=0.06 SENSITIVE Sensitive     CEFTRIAXONE (non-meningitis) <=0.12 SENSITIVE Sensitive     * STREPTOCOCCUS PNEUMONIAE  Blood culture (routine x 2)     Status: None (Preliminary result)   Collection Time: 08/02/18 12:42 PM  Result Value Ref Range Status   Specimen Description BLOOD BLOOD LEFT HAND  Final   Special Requests   Final    BOTTLES DRAWN AEROBIC ONLY Blood Culture results may not be optimal due to an inadequate volume of blood received in culture bottles   Culture   Final    NO GROWTH 3 DAYS Performed at [facility], [address] [place], [place] [address]    Report Status PENDING  Incomplete  Blood Culture ID Panel (Reflexed)     Status: Abnormal   Collection Time: 08/02/18 12:42 PM  Result Value Ref Range Status   Enterococcus species NOT DETECTED NOT DETECTED Final   Listeria monocytogenes NOT DETECTED NOT DETECTED Final   Staphylococcus species NOT DETECTED NOT DETECTED Final   Staphylococcus aureus (BCID) NOT DETECTED NOT DETECTED Final   Streptococcus species DETECTED (A) NOT DETECTED Final    Comment: CRITICAL RESULT CALLED TO, READ BACK BY AND VERIFIED WITH: DAVID BESANTI @0145  08/03/18 AKT    Streptococcus agalactiae NOT DETECTED NOT DETECTED Final   Streptococcus pneumoniae DETECTED (A) NOT DETECTED Final    Comment: CRITICAL RESULT CALLED TO, READ BACK BY AND VERIFIED WITH: DAVID BESANT @0145  08/03/18 AKT    Streptococcus pyogenes NOT DETECTED NOT DETECTED Final   Acinetobacter baumannii NOT DETECTED NOT DETECTED Final   Enterobacteriaceae species NOT DETECTED NOT DETECTED Final   Enterobacter cloacae complex NOT DETECTED NOT DETECTED Final   Escherichia coli NOT DETECTED NOT DETECTED Final   Klebsiella oxytoca NOT DETECTED NOT DETECTED  Final   Klebsiella pneumoniae NOT DETECTED NOT DETECTED Final   Proteus species NOT DETECTED NOT DETECTED Final   Serratia marcescens NOT DETECTED NOT DETECTED Final   Haemophilus influenzae NOT DETECTED NOT DETECTED Final   Neisseria meningitidis NOT DETECTED NOT DETECTED Final   Pseudomonas aeruginosa NOT DETECTED NOT DETECTED Final   Candida albicans NOT DETECTED NOT DETECTED Final   Candida glabrata NOT DETECTED NOT DETECTED Final   Candida krusei NOT DETECTED NOT DETECTED Final   Candida parapsilosis NOT DETECTED NOT DETECTED Final   Candida tropicalis NOT DETECTED NOT DETECTED Final    Comment: Performed at [facility], [address] [place], [place] [address]  MRSA PCR Screening     Status: None   Collection Time: 08/02/18  5:05 PM  Result Value Ref Range Status   MRSA by PCR NEGATIVE NEGATIVE Final    Comment:        The GeneXpert MRSA Assay (FDA approved for NASAL specimens only), is one component of a comprehensive MRSA colonization surveillance program. It is not intended to diagnose MRSA infection nor to guide or monitor treatment for MRSA infections. Performed at [facility], [address] [place], [place] [address]   CULTURE, BLOOD (ROUTINE X 2) w Reflex to ID Panel     Status: None (Preliminary result)   Collection Time: 08/04/18  7:04 PM  Result Value Ref Range Status   Specimen Description BLOOD BLOOD RIGHT WRIST  Final   Special Requests   Final    BOTTLES DRAWN AEROBIC AND ANAEROBIC Blood Culture  adequate volume   Culture   Final    NO GROWTH 2 DAYS Performed at [facility], [address] [place], [place] [address]    Report Status PENDING  Incomplete  CULTURE, BLOOD (ROUTINE X 2) w Reflex to ID Panel     Status: None (Preliminary result)   Collection Time: 08/04/18  7:12 PM  Result Value Ref Range Status   Specimen Description BLOOD BLOOD LEFT WRIST  Final   Special Requests   Final    BOTTLES DRAWN AEROBIC AND  ANAEROBIC Blood Culture adequate volume   Culture   Final    NO GROWTH 2 DAYS Performed at [facility], [address], [place], [place] [address]    Report Status PENDING  Incomplete    RADIOLOGY:  No results found.   Management plans discussed with the patient, family and they are in agreement.  CODE STATUS: Prior   TOTAL TIME TAKING CARE OF THIS PATIENT: 40 minutes.    Berna Spare Ky Moskowitz M.D on 08/06/2018 at 4:29 PM  Between 7am to 6pm - Pager - [phone]  After 6pm go to [email or web address] - Proofreader  [facility]  Office  [phone]  CC: Primary care physician; Bobby Ginsberg, PA   Note: This dictation was prepared with Dragon dictation along with smaller phrase technology. Any transcriptional errors that result from this process are unintentional.

## 2018-08-06 NOTE — Discharge Instructions (Signed)
It was so nice to meet you during this hospitalization!  You came into the hospital with pneumonia. We have been treating you with antibiotics. I have prescribed some antibiotics for you to take at home- please take Omnicef (Cefdinir) twice a day for 4 more days. I have also prescribed some steroids for you to take at home- please take Prednisone 50mg  once daily with breakfast for 5 days.  Your heart rate was high when you were here. I have increased your Metoprolol dose to 150mg  daily. I have sent in a new prescription for 50mg  tablets, so you should take Metoprolol 3 tablets (150mg  total) once daily.  Take care, Dr. Brett Albino

## 2018-08-06 NOTE — Care Management Important Message (Signed)
Copy of signed Medicare IM left with patient in room. 

## 2018-08-07 ENCOUNTER — Telehealth: Payer: Self-pay

## 2018-08-07 NOTE — Telephone Encounter (Signed)
Transition Care Management Follow-up Telephone Call  Date of discharge and from where: [facility] on 08/06/17.  How have you been since you were released from the hospital? Doing better, oxygen has been going up and down but wife increased oxygen to 2.5L and it has been staying in the 90s (ranging from 90-97 on 2.5L). Having loose stools and some SOB. Still coughing and has white sputum. Declines fever, nausea, vomiting or pain.    Any questions or concerns? Increasing oxygen to 3L.  Discussed with PCP and advised wife per PCPs instructions: ok to increase to 3L temporarily until completion of abx then taper back down. Wife stated understanding.    Items Reviewed:  Did the pt receive and understand the discharge instructions provided? Yes   Medications obtained and verified? Yes   Any new allergies since your discharge? No   Dietary orders reviewed? Yes  Do you have support at home? Yes   Other (ie: DME, Home Health, etc) N/A  Functional Questionnaire: (I = Independent and D = Dependent)  Bathing/Dressing- Uses a shower chair for bathing.    Meal Prep- D  Eating- I  Maintaining continence- I   Transferring/Ambulation- I  Managing Meds- D, wife manages medications.   Follow up appointments reviewed:    PCP Hospital f/u appt confirmed? Yes  Scheduled to see Carmon Ginsberg on 08/13/18 @ 9:00 AM.  [facility] f/u appt confirmed? N/A   Are transportation arrangements needed? No   If their condition worsens, is the pt aware to call  their PCP or go to the ED? Yes  Was the patient provided with contact information for the PCP's office or ED? Yes  Was the pt encouraged to call back with questions or concerns? Yes

## 2018-08-08 ENCOUNTER — Telehealth: Payer: Self-pay | Admitting: Family Medicine

## 2018-08-08 ENCOUNTER — Other Ambulatory Visit: Payer: Self-pay | Admitting: Family Medicine

## 2018-08-08 LAB — CULTURE, BLOOD (ROUTINE X 2): Culture: NO GROWTH

## 2018-08-08 MED ORDER — NYSTATIN-TRIAMCINOLONE 100000-0.1 UNIT/GM-% EX OINT: TOPICAL_OINTMENT | CUTANEOUS | 0 refills | Status: DC

## 2018-08-08 NOTE — Telephone Encounter (Signed)
Pt's wife calling about pt having burning and redness on his rectum and testicle area.  Asking if Bobby Peterson could call him in something to put on them to relieve irritation.  Uses the following pharmacy:  [facility] [place] [facility] [place] [phone] (Phone) [phone] (Fax)   Please advise.  Thanks, [employer]

## 2018-08-08 NOTE — Telephone Encounter (Signed)
I have sent in a ointment. I can recheck him at the hospital followup visit in 5 days.

## 2018-08-08 NOTE — Telephone Encounter (Signed)
Mrs Varden advised.   Thanks,   -Mickel Baas

## 2018-08-09 LAB — CULTURE, BLOOD (ROUTINE X 2)
Culture: NO GROWTH
Culture: NO GROWTH
Special Requests: ADEQUATE
Special Requests: ADEQUATE

## 2018-08-13 ENCOUNTER — Encounter: Payer: Self-pay | Admitting: Family Medicine

## 2018-08-13 ENCOUNTER — Telehealth: Payer: Self-pay | Admitting: Family Medicine

## 2018-08-13 ENCOUNTER — Ambulatory Visit (INDEPENDENT_AMBULATORY_CARE_PROVIDER_SITE_OTHER): Payer: Medicare HMO | Admitting: Family Medicine

## 2018-08-13 VITALS — BP 130/76 | HR 112 | Temp 98.1°F | Wt 160.4 lb

## 2018-08-13 DIAGNOSIS — J13 Pneumonia due to Streptococcus pneumoniae: Secondary | ICD-10-CM | POA: Diagnosis not present

## 2018-08-13 DIAGNOSIS — I471 Supraventricular tachycardia: Secondary | ICD-10-CM | POA: Diagnosis not present

## 2018-08-13 NOTE — Progress Notes (Signed)
  Subjective:     Patient ID: Bobby Peterson, male   DOB: 05-17-1941, 78 y.o.   MRN: [id number] Chief Complaint  Patient presents with  . Hospitalization Follow-up    Patient presents today for a hospital follow-up for pneumonia. Patient was admitted into [facility] on 08/03/2018 to 08/06/2018. Patient had a transitions  of care call on 1//01/2019. Patient is suppose to be on 2 liters of oxygen but wife has raised it to 3 liters.   HPI States he feels better with cough occasionally productive of white phlegm. Wife reports desaturations to the 80's on 2 liters so has maintained him on 3 liters. Metoprolol was increased to 150 mg due to sinus tachycardia which is asymptomatic.  Review of Systems     Objective:   Physical Exam Constitutional:      General: He is not in acute distress.    Appearance: He is ill-appearing (chronically).  Cardiovascular:     Rate and Rhythm: Regular rhythm. Tachycardia present.  Pulmonary:     Effort: Pulmonary effort is normal.     Breath sounds: No wheezing.     Comments: Decreased breath sounds throughout. Musculoskeletal:     Right lower leg: No edema.     Left lower leg: No edema.  Neurological:     Mental Status: He is alert.        Assessment:    1. Pneumonia of right lower lobe due to Streptococcus pneumoniae [place]): resolved  2. Atrial tachycardia ([place]): continue metoprolol 150 mg. If not improving at next visit consider cardiology evaluation.    Plan:    F/u in 3 weeks to recheck x-ray and heart rate.

## 2018-08-13 NOTE — Telephone Encounter (Signed)
Patient was advised and wife [name] states she will put oxygen back down to 2 liters while patient is just at home.

## 2018-08-13 NOTE — Telephone Encounter (Signed)
Put oxygen at 2 liters when he is not exerting himself and 3 liters when he is exerting himself.

## 2018-08-13 NOTE — Telephone Encounter (Signed)
Pt's wife needing to know what dial to put pt's oxygen level on.  Please advise.  Thanks, [employer]

## 2018-08-13 NOTE — Patient Instructions (Addendum)
Will update you chest x ray at next office visit and recheck your heart rate

## 2018-08-15 DIAGNOSIS — J449 Chronic obstructive pulmonary disease, unspecified: Secondary | ICD-10-CM | POA: Diagnosis not present

## 2018-08-16 DIAGNOSIS — J449 Chronic obstructive pulmonary disease, unspecified: Secondary | ICD-10-CM | POA: Diagnosis not present

## 2018-08-18 ENCOUNTER — Other Ambulatory Visit: Payer: Self-pay | Admitting: Family Medicine

## 2018-08-18 DIAGNOSIS — E782 Mixed hyperlipidemia: Secondary | ICD-10-CM

## 2018-08-18 DIAGNOSIS — R202 Paresthesia of skin: Secondary | ICD-10-CM

## 2018-08-21 ENCOUNTER — Other Ambulatory Visit: Payer: Self-pay | Admitting: Internal Medicine

## 2018-08-28 ENCOUNTER — Telehealth: Payer: Self-pay

## 2018-08-28 NOTE — Telephone Encounter (Signed)
Sent in [place] for metoprolol 08/28/18 to covermymeds.  dbs

## 2018-08-30 ENCOUNTER — Telehealth: Payer: Self-pay

## 2018-08-30 ENCOUNTER — Other Ambulatory Visit: Payer: Self-pay | Admitting: Family Medicine

## 2018-08-30 ENCOUNTER — Ambulatory Visit
Admission: RE | Admit: 2018-08-30 | Discharge: 2018-08-30 | Disposition: A | Discharge status: Home / Self Care | Payer: 59 | Attending: Family Medicine | Admitting: Family Medicine

## 2018-08-30 ENCOUNTER — Ambulatory Visit
Admission: RE | Admit: 2018-08-30 | Discharge: 2018-08-30 | Disposition: A | Discharge status: Home / Self Care | Payer: 59 | Source: Ambulatory Visit | Attending: Family Medicine | Admitting: Family Medicine

## 2018-08-30 ENCOUNTER — Encounter: Payer: Self-pay | Admitting: Family Medicine

## 2018-08-30 ENCOUNTER — Ambulatory Visit (INDEPENDENT_AMBULATORY_CARE_PROVIDER_SITE_OTHER): Payer: Medicare HMO | Admitting: Family Medicine

## 2018-08-30 VITALS — BP 96/50 | HR 100 | Temp 97.4°F | Resp 16 | Wt 159.4 lb

## 2018-08-30 DIAGNOSIS — J189 Pneumonia, unspecified organism: Secondary | ICD-10-CM

## 2018-08-30 DIAGNOSIS — N321 Vesicointestinal fistula: Secondary | ICD-10-CM | POA: Insufficient documentation

## 2018-08-30 DIAGNOSIS — K649 Unspecified hemorrhoids: Secondary | ICD-10-CM | POA: Diagnosis not present

## 2018-08-30 DIAGNOSIS — K625 Hemorrhage of anus and rectum: Secondary | ICD-10-CM

## 2018-08-30 DIAGNOSIS — J181 Lobar pneumonia, unspecified organism: Principal | ICD-10-CM

## 2018-08-30 DIAGNOSIS — R05 Cough: Secondary | ICD-10-CM | POA: Insufficient documentation

## 2018-08-30 MED ORDER — HYDROCORTISONE ACETATE 25 MG RE SUPP: 25.0000 mg | Freq: Two times a day (BID) | RECTAL | 0 refills | Status: DC | PRN

## 2018-08-30 NOTE — Patient Instructions (Signed)
If your bleeding returns or you are anemic I am going to refer to a specialist.

## 2018-08-30 NOTE — Progress Notes (Signed)
  Subjective:     Patient ID: Bobby Peterson, male   DOB: 02/27/1941, 78 y.o.   MRN: [id number] Chief Complaint  Patient presents with  . Hemorrhoids    Patient comes in office today with concerns of possible hemorrhoids. Patients wife who is accompanied with patient states that patient has had soft stools but has been straining. Patient reports pain passing bowel movement and wife reports seeing a large amount of blood in toliet and floor.    HPI States he had a painful bowel movement last night associated with a moderate amount of bleeding but stools were brown per wife. He was unable to see the blood well but she believes it was not bright red. She reports he only has bleeding with a bowel movement. He does not wish to have an internal rectal exam today. Denies dizziness or abdominal pain.  Review of Systems     Objective:   Physical Exam Constitutional:      General: He is not in acute distress. Abdominal:     Tenderness: There is no abdominal tenderness ( epigastric).  Genitourinary:    Comments: Several prominent skin tags but no external hemohhroids or active bleeding. Neurological:     Mental Status: He is alert.        Assessment:    1. Rectal bleeding - CBC with Differential/Platelet  2. Hemorrhoids, unspecified hemorrhoid type - hydrocortisone (ANUSOL-HC) 25 MG suppository; Place 1 suppository (25 mg total) rectally 2 (two) times daily as needed for hemorrhoids or anal itching.  Dispense: 12 suppository; Refill: 0    Plan:    Will refer for significant anemia or recurrent bleeding. Advised wife to take him to the ER for bleeding/dizziness.

## 2018-08-30 NOTE — Telephone Encounter (Signed)
Patient wife [name] wanted you to know that patient has hemorrhoids and he is having trouble sitting down. Wife also stated that patient has been blooding and she has applied hemorrhoid cream to the area every time he use the bathroom. Wife schedule patient appointment today at 12:20pm to been seen.

## 2018-08-30 NOTE — Telephone Encounter (Signed)
Error

## 2018-08-31 ENCOUNTER — Other Ambulatory Visit: Payer: Self-pay | Admitting: Family Medicine

## 2018-08-31 DIAGNOSIS — J189 Pneumonia, unspecified organism: Secondary | ICD-10-CM

## 2018-08-31 DIAGNOSIS — J181 Lobar pneumonia, unspecified organism: Principal | ICD-10-CM

## 2018-08-31 LAB — CBC WITH DIFFERENTIAL/PLATELET
Basophils Absolute: 0 10*3/uL (ref 0.0–0.2)
Basos: 0 %
EOS (ABSOLUTE): 0.3 10*3/uL (ref 0.0–0.4)
Eos: 3 %
HEMATOCRIT: 34.6 % — AB (ref 37.5–51.0)
Hemoglobin: 11 g/dL — ABNORMAL LOW (ref 13.0–17.7)
Immature Grans (Abs): 0.1 10*3/uL (ref 0.0–0.1)
Immature Granulocytes: 1 %
Lymphocytes Absolute: 2.1 10*3/uL (ref 0.7–3.1)
Lymphs: 20 %
MCH: 27.8 pg (ref 26.6–33.0)
MCHC: 31.8 g/dL (ref 31.5–35.7)
MCV: 87 fL (ref 79–97)
Monocytes Absolute: 0.8 10*3/uL (ref 0.1–0.9)
Monocytes: 8 %
NEUTROS ABS: 7.5 10*3/uL — AB (ref 1.4–7.0)
Neutrophils: 68 %
Platelets: 324 10*3/uL (ref 150–450)
RBC: 3.96 x10E6/uL — ABNORMAL LOW (ref 4.14–5.80)
RDW: 15.7 % — ABNORMAL HIGH (ref 11.6–15.4)
WBC: 10.8 10*3/uL (ref 3.4–10.8)

## 2018-09-03 ENCOUNTER — Ambulatory Visit: Payer: Medicare HMO | Admitting: Family Medicine

## 2018-09-04 ENCOUNTER — Other Ambulatory Visit: Payer: Self-pay | Admitting: Family Medicine

## 2018-09-04 ENCOUNTER — Telehealth: Payer: Self-pay | Admitting: Family Medicine

## 2018-09-04 MED ORDER — METOPROLOL SUCCINATE ER 50 MG PO TB24: ORAL_TABLET | ORAL | 1 refills | Status: DC

## 2018-09-04 NOTE — Telephone Encounter (Signed)
Please review.  I believe this was changed when released from hospital from what I seen in chart

## 2018-09-04 NOTE — Telephone Encounter (Signed)
[name] faxed refill request for the following medications:  metoprolol succinate (TOPROL-XL) 100 MG 24 hr tablet   90 day supply  Last Rx: 10/05/17 looks like it was stopped when pt was discharged from hospital LOV: 08/30/2018 Please advise. Thanks TNP

## 2018-09-04 NOTE — Telephone Encounter (Signed)
refilled 

## 2018-09-12 ENCOUNTER — Telehealth: Payer: Self-pay | Admitting: Family Medicine

## 2018-09-12 NOTE — Telephone Encounter (Signed)
[facility] faxed refill request for the following medications:  nitrofurantoin, macrocrystal-monohydrate, (MACROBID) 100 MG capsule   Please advise.

## 2018-09-12 NOTE — Telephone Encounter (Signed)
Patient has one more day of Nitrofurantoin left to take.  He is suppose to be on it for 10 days and [name] said she is having to get it through mail order and it will take 5-10 days.  Is it ok for [facility] to go without this for that long?

## 2018-09-13 ENCOUNTER — Other Ambulatory Visit: Payer: Self-pay | Admitting: Family Medicine

## 2018-09-13 MED ORDER — NITROFURANTOIN MONOHYD MACRO 100 MG PO CAPS: 100.0000 mg | ORAL_CAPSULE | Freq: Every day | ORAL | 0 refills | Status: DC

## 2018-09-13 MED ORDER — NITROFURANTOIN MONOHYD MACRO 100 MG PO CAPS: 100.0000 mg | ORAL_CAPSULE | Freq: Every day | ORAL | 1 refills | Status: DC

## 2018-09-13 NOTE — Telephone Encounter (Signed)
Spoke with wife on the phone who states that patient has been taking antibiotic everyday since October and now has only one pill left. 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 Reviewing chart medication looks like it was last prescribed 04/30/18 for a 90day supply and was reported d/C by Clemencia Course on 08/02/18. Please review chart and clarify. KW

## 2018-09-13 NOTE — Telephone Encounter (Signed)
Let them know I did a 14 day rx to [facility] and a 3 month rx with refills to [facility]

## 2018-09-13 NOTE — Telephone Encounter (Signed)
Please see previous telephone encounter. KW

## 2018-09-13 NOTE — Telephone Encounter (Signed)
Patients wife has been advised. KW 

## 2018-09-15 DIAGNOSIS — J449 Chronic obstructive pulmonary disease, unspecified: Secondary | ICD-10-CM | POA: Diagnosis not present

## 2018-09-16 DIAGNOSIS — J449 Chronic obstructive pulmonary disease, unspecified: Secondary | ICD-10-CM | POA: Diagnosis not present

## 2018-09-21 ENCOUNTER — Ambulatory Visit
Admission: RE | Admit: 2018-09-21 | Discharge: 2018-09-21 | Disposition: A | Discharge status: Home / Self Care | Payer: Medicare HMO | Attending: Family Medicine | Admitting: Family Medicine

## 2018-09-21 ENCOUNTER — Telehealth: Payer: Self-pay

## 2018-09-21 ENCOUNTER — Ambulatory Visit
Admission: RE | Admit: 2018-09-21 | Discharge: 2018-09-21 | Disposition: A | Discharge status: Home / Self Care | Payer: Medicare HMO | Source: Ambulatory Visit | Attending: Family Medicine | Admitting: Family Medicine

## 2018-09-21 DIAGNOSIS — J189 Pneumonia, unspecified organism: Secondary | ICD-10-CM | POA: Insufficient documentation

## 2018-09-21 DIAGNOSIS — J181 Lobar pneumonia, unspecified organism: Secondary | ICD-10-CM | POA: Diagnosis not present

## 2018-09-21 NOTE — Telephone Encounter (Signed)
-----   Message from Carmon Ginsberg, [place] sent at 09/21/2018  2:07 PM EST ----- Pneumonia improved.

## 2018-09-21 NOTE — Telephone Encounter (Signed)
Patients wife has been advised. KW 

## 2018-10-14 DIAGNOSIS — J449 Chronic obstructive pulmonary disease, unspecified: Secondary | ICD-10-CM | POA: Diagnosis not present

## 2018-10-15 DIAGNOSIS — J449 Chronic obstructive pulmonary disease, unspecified: Secondary | ICD-10-CM | POA: Diagnosis not present

## 2018-10-24 ENCOUNTER — Other Ambulatory Visit: Payer: Self-pay

## 2018-10-24 DIAGNOSIS — J449 Chronic obstructive pulmonary disease, unspecified: Secondary | ICD-10-CM

## 2018-10-24 MED ORDER — FLUTICASONE-SALMETEROL 250-50 MCG/DOSE IN AEPB: 1.0000 | INHALATION_SPRAY | Freq: Two times a day (BID) | RESPIRATORY_TRACT | 1 refills | Status: DC

## 2018-10-24 MED ORDER — NITROFURANTOIN MONOHYD MACRO 100 MG PO CAPS: 100.0000 mg | ORAL_CAPSULE | Freq: Every day | ORAL | 1 refills | Status: DC

## 2018-10-24 NOTE — Telephone Encounter (Signed)
Let's make sure he is set up with another provider (after COVID restrictions lifed, May?)

## 2018-10-24 NOTE — Telephone Encounter (Signed)
Appointment schedule for 12/03/2018.

## 2018-11-14 DIAGNOSIS — J449 Chronic obstructive pulmonary disease, unspecified: Secondary | ICD-10-CM | POA: Diagnosis not present

## 2018-11-15 DIAGNOSIS — J449 Chronic obstructive pulmonary disease, unspecified: Secondary | ICD-10-CM | POA: Diagnosis not present

## 2018-12-03 ENCOUNTER — Encounter: Payer: Self-pay | Admitting: Family Medicine

## 2018-12-03 ENCOUNTER — Ambulatory Visit (INDEPENDENT_AMBULATORY_CARE_PROVIDER_SITE_OTHER): Payer: Medicare HMO | Admitting: Family Medicine

## 2018-12-03 VITALS — BP 128/70 | HR 90

## 2018-12-03 DIAGNOSIS — J432 Centrilobular emphysema: Secondary | ICD-10-CM | POA: Diagnosis not present

## 2018-12-03 DIAGNOSIS — I5032 Chronic diastolic (congestive) heart failure: Secondary | ICD-10-CM | POA: Diagnosis not present

## 2018-12-03 DIAGNOSIS — E782 Mixed hyperlipidemia: Secondary | ICD-10-CM | POA: Diagnosis not present

## 2018-12-03 DIAGNOSIS — F17210 Nicotine dependence, cigarettes, uncomplicated: Secondary | ICD-10-CM

## 2018-12-03 DIAGNOSIS — I10 Essential (primary) hypertension: Secondary | ICD-10-CM

## 2018-12-03 DIAGNOSIS — N183 Chronic kidney disease, stage 3 unspecified: Secondary | ICD-10-CM

## 2018-12-03 DIAGNOSIS — I2510 Atherosclerotic heart disease of native coronary artery without angina pectoris: Secondary | ICD-10-CM | POA: Diagnosis not present

## 2018-12-03 DIAGNOSIS — N321 Vesicointestinal fistula: Secondary | ICD-10-CM | POA: Diagnosis not present

## 2018-12-03 DIAGNOSIS — J9611 Chronic respiratory failure with hypoxia: Secondary | ICD-10-CM

## 2018-12-03 NOTE — Assessment & Plan Note (Signed)
Chronic, stable Denies any swelling or SOB Continue lasix daily Recheck metabolic panel at next in person visit

## 2018-12-03 NOTE — Assessment & Plan Note (Signed)
Patient has stopped smoking several years ago (unsure if it has been >15) Has never had AAA screening, but recommendation is for up to age 80 Has never had Low dose CT for lung cancer screening and likely qualifies as long as did not quit >15 years ago Will discuss further at next in person visit Is using chewing tobacco which we discussed the risks of

## 2018-12-03 NOTE — Progress Notes (Signed)
Patient: Bobby Peterson Male    DOB: 03-Nov-1940   78 y.o.   MRN: [id number] Visit Date: 12/03/2018  Today's Provider: Lavon Paganini, MD   Chief Complaint  Patient presents with  . Hypertension  . Hyperlipidemia   Subjective:    Virtual Visit via Telephone Note  I connected with Bobby Peterson on 12/03/18 at  8:40 AM EDT by telephone and verified that I am speaking with the correct person using two identifiers.   Patient location: home Provider location: home office  Persons involved in the visit: patient, provider     I discussed the limitations, risks, security and privacy concerns of performing an evaluation and management service by telephone and the availability of in person appointments. I also discussed with the patient that there may be a patient responsible charge related to this service. The patient expressed understanding and agreed to proceed.  Patient unable to do a video and audio telehealth visit as he has no Internet access   HPI  Hypertension, follow-up:  BP Readings from Last 3 Encounters:  12/03/18 128/70  08/30/18 (!) 96/50  08/13/18 130/76    He was last seen for hypertension more than 1 years ago.  Management since that visit includes no changes.He reports good compliance with treatment. He is not having side effects.  He is not exercising. He is not adherent to low salt diet.   Outside blood pressures are being checked at home. He is experiencing none.  Patient denies chest pain, chest pressure/discomfort, claudication, dyspnea, exertional chest pressure/discomfort, fatigue, irregular heart beat, lower extremity edema, near-syncope, orthopnea, palpitations, paroxysmal nocturnal dyspnea, syncope and tachypnea.   Cardiovascular risk factors include advanced age (older than 31 for men, 52 for women), dyslipidemia, hypertension and male gender.  Use of agents associated with hypertension: none.    ------------------------------------------------------------------------    Lipid/Cholesterol, Follow-up:   Last seen for this more than 1 years ago.  Management since that visit includes no change.  Last Lipid Panel:    Component Value Date/Time   CHOL 108 08/17/2017 0959   TRIG 122 08/17/2017 0959   HDL 41 08/17/2017 0959   CHOLHDL 2.6 08/17/2017 0959   LDLCALC 43 08/17/2017 0959    He reports good compliance with treatment. He is not having side effects.   Wt Readings from Last 3 Encounters:  08/30/18 159 lb 6.4 oz (72.3 kg)  08/13/18 160 lb 6.4 oz (72.8 kg)  08/02/18 165 lb (74.8 kg)    ------------------------------------------------------------------------  Not a good surgical candidate for repair of colovesicular fistula.  Taking Macrobid daily for UTI prophylaxis.  Never had lung cancer screening.   Allergies  Allergen Reactions  . Fenofibrate     Other reaction(s): Headache  . Lisinopril Cough  . Niacin     Other reaction(s): Dizziness  . Phenergan [Promethazine Hcl]     NVD  . Simvastatin     Other reaction(s): Headache     Current Outpatient Medications:  .  albuterol (PROVENTIL HFA;VENTOLIN HFA) 108 (90 Base) MCG/ACT inhaler, Inhale 2 puffs into the lungs every 4 (four) hours as needed for wheezing or shortness of breath., Disp: 1 Inhaler, Rfl: 5 .  aspirin 81 MG tablet, Take 81 mg by mouth daily., Disp: , Rfl:  .  Fluticasone-Salmeterol (ADVAIR) 250-50 MCG/DOSE AEPB, Inhale 1 puff into the lungs 2 (two) times daily., Disp: 180 each, Rfl: 1 .  furosemide (LASIX) 40 MG tablet, TAKE 1 TABLET EVERY DAY, Disp: 90 tablet, Rfl:  3 .  gabapentin (NEURONTIN) 300 MG capsule, TAKE 1 CAPSULE TWICE DAILY, Disp: 180 capsule, Rfl: 3 .  hydrocortisone (ANUSOL-HC) 25 MG suppository, Place 1 suppository (25 mg total) rectally 2 (two) times daily as needed for hemorrhoids or anal itching., Disp: 12 suppository, Rfl: 0 .  metoprolol succinate (TOPROL-XL) 50 MG 24 hr  tablet, TAKE 3 TABLETS (150 MG TOTAL)  DAILY. TAKE WITH OR IMMEDIATELY FOLLOWING A MEAL., Disp: 270 tablet, Rfl: 1 .  nitrofurantoin, macrocrystal-monohydrate, (MACROBID) 100 MG capsule, Take 1 capsule (100 mg total) by mouth at bedtime., Disp: 14 capsule, Rfl: 0 .  omeprazole (PRILOSEC) 40 MG capsule, TAKE 1 CAPSULE DAILY BEFORE BREAKFAST., Disp: 90 capsule, Rfl: 3 .  OXYGEN, Inhale 2 L/min into the lungs continuous. , Disp: , Rfl:  .  pravastatin (PRAVACHOL) 20 MG tablet, TAKE 1 TABLET EVERY DAY, Disp: 90 tablet, Rfl: 3 .  tamsulosin (FLOMAX) 0.4 MG CAPS capsule, TAKE 1 CAPSULE EVERY DAY, Disp: 90 capsule, Rfl: 3 .  vitamin B-12 (CYANOCOBALAMIN) 1000 MCG tablet, Take 1,000 mcg by mouth once a week. *Take on Monday*, Disp: , Rfl:   Review of Systems  Constitutional: Negative.   Respiratory: Negative.   Cardiovascular: Negative.   Musculoskeletal: Negative.     Social History   Tobacco Use  . Smoking status: Former Smoker    Packs/day: 1.00    Years: 50.00    Pack years: 50.00    Types: Cigarettes    Last attempt to quit: 12/04/2003    Years since quitting: 15.0  . Smokeless tobacco: Current User    Types: Chew  . Tobacco comment: Has been chewing tobacco since quitting smoking in 2005.  Substance Use Topics  . Alcohol use: No      Objective:   BP 128/70 (BP Location: Right Arm, Patient Position: Sitting, Cuff Size: Normal)   Pulse 90   SpO2 97%  Vitals:   12/03/18 0807  BP: 128/70  Pulse: 90  SpO2: 97%   Patient provided vital signs  Physical Exam      Assessment & Plan    I discussed the assessment and treatment plan with the patient. The patient was provided an opportunity to ask questions and all were answered. The patient agreed with the plan and demonstrated an understanding of the instructions.   The patient was advised to call back or seek an in-person evaluation if the symptoms worsen or if the condition fails to improve as anticipated.  Problem List Items  Addressed This Visit      Cardiovascular and Mediastinum   Coronary atherosclerosis    Stable without angina Continue ASA and beta blocker      Chronic diastolic heart failure (HCC)    Chronic, stable Denies any swelling or SOB Continue lasix daily Recheck metabolic panel at next in person visit      Essential hypertension - Primary    Chronic, well controlled Continue metoprolol and Lasix daily Check metabolic panel at next in person visit        Respiratory   COPD (chronic obstructive pulmonary disease) (HCC)    Chronic, stable GOLD grade D O2 dependent at 2L/min Continue Advair and albuterol prn May benefit from triple therapy Has previously seen pulm and done pulm rehab but not for several years      Chronic hypoxemic respiratory failure (HCC)    2/2 COPD Continuous O2 at 2L/min stable        Digestive   Colovesical fistula    Chronic  Saw urology and gen surgery regarding this, but is not a good surgical candidate Unlikely to heal spontaneously Continue daily macrobid for UTI prophylaxis        Genitourinary   Chronic kidney disease (CKD), stage III (moderate) (HCC)    Chronic Avoid NSAIDs Renally adjust medications Recheck metabolic panel in 3 months at in person visit        Other   HLD (hyperlipidemia)    Last LDL <70 in 2019 Repeat CMP and lipid panel in 3 m at next in person visit Continue pravastatin      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   The entirety of the information documented in the History of  Present Illness, Review of Systems and Physical Exam were personally obtained by me. Portions of this information were initially documented by Tiburcio Pea, CMA and reviewed by me for thoroughness and accuracy.    Virginia Crews, MD, MPH [facility] 12/03/2018 9:13 AM

## 2018-12-03 NOTE — Assessment & Plan Note (Signed)
Chronic, well controlled Continue metoprolol and Lasix daily Check metabolic panel at next in person visit

## 2018-12-03 NOTE — Assessment & Plan Note (Signed)
Chronic, stable GOLD grade D O2 dependent at 2L/min Continue Advair and albuterol prn May benefit from triple therapy Has previously seen pulm and done pulm rehab but not for several years

## 2018-12-03 NOTE — Assessment & Plan Note (Signed)
Chronic Avoid NSAIDs Renally adjust medications Recheck metabolic panel in 3 months at in person visit

## 2018-12-03 NOTE — Assessment & Plan Note (Signed)
2/2 COPD Continuous O2 at 2L/min stable

## 2018-12-03 NOTE — Assessment & Plan Note (Signed)
Chronic Saw urology and gen surgery regarding this, but is not a good surgical candidate Unlikely to heal spontaneously Continue daily macrobid for UTI prophylaxis

## 2018-12-03 NOTE — Assessment & Plan Note (Signed)
Last LDL <70 in 2019 Repeat CMP and lipid panel in 3 m at next in person visit Continue pravastatin

## 2018-12-03 NOTE — Assessment & Plan Note (Signed)
Stable without angina Continue ASA and beta blocker

## 2018-12-31 DIAGNOSIS — J449 Chronic obstructive pulmonary disease, unspecified: Secondary | ICD-10-CM | POA: Diagnosis not present

## 2019-01-09 ENCOUNTER — Other Ambulatory Visit: Payer: Self-pay | Admitting: Family Medicine

## 2019-01-09 MED ORDER — METOPROLOL SUCCINATE ER 50 MG PO TB24: ORAL_TABLET | ORAL | 2 refills | Status: DC

## 2019-01-09 NOTE — Telephone Encounter (Signed)
[facility] faxed refill request for the following medications:  metoprolol succinate ER 100 MG 24 hr tablet    Please advise.  Thanks, [employer]

## 2019-01-23 ENCOUNTER — Other Ambulatory Visit: Payer: Self-pay | Admitting: Family Medicine

## 2019-01-23 DIAGNOSIS — I5032 Chronic diastolic (congestive) heart failure: Secondary | ICD-10-CM

## 2019-01-23 MED ORDER — FUROSEMIDE 40 MG PO TABS: 40.0000 mg | ORAL_TABLET | Freq: Every day | ORAL | 3 refills | Status: DC

## 2019-01-23 NOTE — Telephone Encounter (Signed)
[facility] faxed refill request for the following medications:  furosemide (LASIX) 40 MG tablet  Dr. B patient  Please advise.

## 2019-01-30 DIAGNOSIS — J449 Chronic obstructive pulmonary disease, unspecified: Secondary | ICD-10-CM | POA: Diagnosis not present

## 2019-03-02 DIAGNOSIS — J449 Chronic obstructive pulmonary disease, unspecified: Secondary | ICD-10-CM | POA: Diagnosis not present

## 2019-03-05 ENCOUNTER — Ambulatory Visit (INDEPENDENT_AMBULATORY_CARE_PROVIDER_SITE_OTHER): Payer: Medicare HMO | Admitting: Family Medicine

## 2019-03-05 ENCOUNTER — Encounter: Payer: Self-pay | Admitting: Family Medicine

## 2019-03-05 ENCOUNTER — Other Ambulatory Visit: Payer: Self-pay

## 2019-03-05 VITALS — BP 117/74 | HR 98 | Temp 98.6°F | Wt 178.0 lb

## 2019-03-05 DIAGNOSIS — I2510 Atherosclerotic heart disease of native coronary artery without angina pectoris: Secondary | ICD-10-CM

## 2019-03-05 DIAGNOSIS — J449 Chronic obstructive pulmonary disease, unspecified: Secondary | ICD-10-CM

## 2019-03-05 DIAGNOSIS — E782 Mixed hyperlipidemia: Secondary | ICD-10-CM

## 2019-03-05 DIAGNOSIS — E663 Overweight: Secondary | ICD-10-CM

## 2019-03-05 DIAGNOSIS — F17210 Nicotine dependence, cigarettes, uncomplicated: Secondary | ICD-10-CM

## 2019-03-05 DIAGNOSIS — J432 Centrilobular emphysema: Secondary | ICD-10-CM | POA: Diagnosis not present

## 2019-03-05 DIAGNOSIS — D638 Anemia in other chronic diseases classified elsewhere: Secondary | ICD-10-CM

## 2019-03-05 DIAGNOSIS — I5032 Chronic diastolic (congestive) heart failure: Secondary | ICD-10-CM

## 2019-03-05 DIAGNOSIS — J9611 Chronic respiratory failure with hypoxia: Secondary | ICD-10-CM | POA: Diagnosis not present

## 2019-03-05 DIAGNOSIS — I10 Essential (primary) hypertension: Secondary | ICD-10-CM

## 2019-03-05 DIAGNOSIS — N183 Chronic kidney disease, stage 3 unspecified: Secondary | ICD-10-CM

## 2019-03-05 DIAGNOSIS — R739 Hyperglycemia, unspecified: Secondary | ICD-10-CM

## 2019-03-05 DIAGNOSIS — R202 Paresthesia of skin: Secondary | ICD-10-CM

## 2019-03-05 MED ORDER — PRAVASTATIN SODIUM 20 MG PO TABS: 20.0000 mg | ORAL_TABLET | Freq: Every day | ORAL | 3 refills | Status: DC

## 2019-03-05 MED ORDER — METOPROLOL SUCCINATE ER 50 MG PO TB24: ORAL_TABLET | ORAL | 2 refills | Status: DC

## 2019-03-05 MED ORDER — GABAPENTIN 300 MG PO CAPS: 300.0000 mg | ORAL_CAPSULE | Freq: Two times a day (BID) | ORAL | 3 refills | Status: DC

## 2019-03-05 MED ORDER — OMEPRAZOLE 40 MG PO CPDR: DELAYED_RELEASE_CAPSULE | ORAL | 3 refills | Status: DC

## 2019-03-05 MED ORDER — ALBUTEROL SULFATE HFA 108 (90 BASE) MCG/ACT IN AERS: 2.0000 | INHALATION_SPRAY | RESPIRATORY_TRACT | 5 refills | Status: DC | PRN

## 2019-03-05 MED ORDER — NITROFURANTOIN MONOHYD MACRO 100 MG PO CAPS: 100.0000 mg | ORAL_CAPSULE | Freq: Every day | ORAL | 1 refills | Status: DC

## 2019-03-05 MED ORDER — FLUTICASONE-SALMETEROL 250-50 MCG/DOSE IN AEPB: 1.0000 | INHALATION_SPRAY | Freq: Two times a day (BID) | RESPIRATORY_TRACT | 1 refills | Status: DC

## 2019-03-05 MED ORDER — TAMSULOSIN HCL 0.4 MG PO CAPS: 0.4000 mg | ORAL_CAPSULE | Freq: Every day | ORAL | 3 refills | Status: DC

## 2019-03-05 MED ORDER — FUROSEMIDE 40 MG PO TABS: 40.0000 mg | ORAL_TABLET | Freq: Every day | ORAL | 3 refills | Status: DC

## 2019-03-05 NOTE — Assessment & Plan Note (Signed)
Chronic Avoid NSAIDs Renally adjust medications Recheck metabolic panel

## 2019-03-05 NOTE — Progress Notes (Signed)
Patient: Bobby Peterson Male    DOB: [birth year]   78 y.o.   MRN: [id number] Visit Date: 03/05/2019  Today's Provider: Lavon Paganini, MD   Chief Complaint  Patient presents with  . Hypertension  . Hyperlipidemia   Subjective:     HPI  Hypertension, follow-up:     BP Readings from Last 3 Encounters:  12/03/18 128/70  08/30/18 (!) 96/50  08/13/18 130/76    He was last seen for hypertension 3 months ago.  Management since that visit includes no changes. He reports good compliance with treatment. He is not having side effects.  He is not exercising. He is not adherent to low salt diet.   Outside blood pressures are being checked at home. He is experiencing none.  Patient denies chest pain, chest pressure/discomfort, claudication, dyspnea, exertional chest pressure/discomfort, fatigue, irregular heart beat, lower extremity edema, near-syncope, orthopnea, palpitations, paroxysmal nocturnal dyspnea, syncope and tachypnea.   Cardiovascular risk factors include advanced age (older than 18 for men, 68 for women), dyslipidemia, hypertension and male gender.  Use of agents associated with hypertension: none.   ------------------------------------------------------------------------    Lipid/Cholesterol, Follow-up:   Last seen for this 3 months ago.  Management since that visit includes no change.  Last Lipid Panel: Lab Results  Component Value Date   CHOL 108 08/17/2017   HDL 41 08/17/2017   LDLCALC 43 08/17/2017   TRIG 122 08/17/2017   CHOLHDL 2.6 08/17/2017   He reports good compliance with treatment. He is not having side effects.      Wt Readings from Last 3 Encounters:  08/30/18 159 lb 6.4 oz (72.3 kg)  08/13/18 160 lb 6.4 oz (72.8 kg)  08/02/18 165 lb (74.8 kg)    ------------------------------------------------------------------------  States that his breathing is at baseline.  He is on chronic O2 therapy at 2 L.  He is taking his  Advair regularly and not needing albuterol very often  He continues to take his Lasix and metoprolol as prescribed for his diastolic heart failure.  He denies any swelling or shortness of breath  Patient continues to take daily Macrobid for prophylaxis given his known colovesicular fistula  Denies any lower urinary tract symptoms.  He is taking Flomax with good compliance  Allergies  Allergen Reactions  . Fenofibrate     Other reaction(s): Headache  . Lisinopril Cough  . Niacin     Other reaction(s): Dizziness  . Phenergan [Promethazine Hcl]     NVD  . Simvastatin     Other reaction(s): Headache     Current Outpatient Medications:  .  albuterol (PROVENTIL HFA;VENTOLIN HFA) 108 (90 Base) MCG/ACT inhaler, Inhale 2 puffs into the lungs every 4 (four) hours as needed for wheezing or shortness of breath., Disp: 1 Inhaler, Rfl: 5 .  aspirin 81 MG tablet, Take 81 mg by mouth daily., Disp: , Rfl:  .  Fluticasone-Salmeterol (ADVAIR) 250-50 MCG/DOSE AEPB, Inhale 1 puff into the lungs 2 (two) times daily., Disp: 180 each, Rfl: 1 .  furosemide (LASIX) 40 MG tablet, Take 1 tablet (40 mg total) by mouth daily., Disp: 90 tablet, Rfl: 3 .  gabapentin (NEURONTIN) 300 MG capsule, TAKE 1 CAPSULE TWICE DAILY, Disp: 180 capsule, Rfl: 3 .  hydrocortisone (ANUSOL-HC) 25 MG suppository, Place 1 suppository (25 mg total) rectally 2 (two) times daily as needed for hemorrhoids or anal itching., Disp: 12 suppository, Rfl: 0 .  metoprolol succinate (TOPROL-XL) 50 MG 24 hr tablet, TAKE 3 TABLETS (  150 MG TOTAL)  DAILY. TAKE WITH OR IMMEDIATELY FOLLOWING A MEAL., Disp: 270 tablet, Rfl: 2 .  nitrofurantoin, macrocrystal-monohydrate, (MACROBID) 100 MG capsule, Take 1 capsule (100 mg total) by mouth at bedtime., Disp: 14 capsule, Rfl: 0 .  omeprazole (PRILOSEC) 40 MG capsule, TAKE 1 CAPSULE DAILY BEFORE BREAKFAST., Disp: 90 capsule, Rfl: 3 .  OXYGEN, Inhale 2 L/min into the lungs continuous. , Disp: , Rfl:  .   pravastatin (PRAVACHOL) 20 MG tablet, TAKE 1 TABLET EVERY DAY, Disp: 90 tablet, Rfl: 3 .  tamsulosin (FLOMAX) 0.4 MG CAPS capsule, TAKE 1 CAPSULE EVERY DAY, Disp: 90 capsule, Rfl: 3 .  vitamin B-12 (CYANOCOBALAMIN) 1000 MCG tablet, Take 1,000 mcg by mouth once a week. *Take on Monday*, Disp: , Rfl:   Review of Systems  Constitutional: Negative.   Respiratory: Negative.   Cardiovascular: Negative.   Musculoskeletal: Negative.     Social History   Tobacco Use  . Smoking status: Former Smoker    Packs/day: 1.00    Years: 50.00    Pack years: 50.00    Types: Cigarettes    Quit date: 12/04/2003    Years since quitting: 15.2  . Smokeless tobacco: Current User    Types: Chew  . Tobacco comment: Has been chewing tobacco since quitting smoking in 2005.  Substance Use Topics  . Alcohol use: No      Objective:   BP (!) 144/79 (BP Location: Right Arm, Patient Position: Sitting, Cuff Size: Normal)   Pulse 98   Temp 98.6 F (37 C) (Oral)   Wt 178 lb (80.7 kg)   SpO2 97%   BMI 29.62 kg/m  Vitals:   03/05/19 0952  BP: (!) 144/79  Pulse: 98  Temp: 98.6 F (37 C)  TempSrc: Oral  SpO2: 97%  Weight: 178 lb (80.7 kg)     Physical Exam Vitals signs reviewed.  Constitutional:      General: He is not in acute distress.    Appearance: Normal appearance. He is not diaphoretic.  HENT:     Head: Normocephalic and atraumatic.  Eyes:     General: No scleral icterus.    Conjunctiva/sclera: Conjunctivae normal.  Neck:     Musculoskeletal: Neck supple.  Cardiovascular:     Rate and Rhythm: Normal rate and regular rhythm.     Pulses: Normal pulses.     Heart sounds: Normal heart sounds. No murmur.  Pulmonary:     Effort: Pulmonary effort is normal. No respiratory distress.     Breath sounds: Normal breath sounds. No wheezing or rhonchi.     Comments: O2 via [facility] Musculoskeletal:     Right lower leg: No edema.     Left lower leg: No edema.  Lymphadenopathy:     Cervical: No  cervical adenopathy.  Skin:    General: Skin is warm and dry.     Capillary Refill: Capillary refill takes less than 2 seconds.     Findings: No rash.  Neurological:     Mental Status: He is alert and oriented to person, place, and time. Mental status is at baseline.     Cranial Nerves: No cranial nerve deficit.  Psychiatric:        Mood and Affect: Mood normal.        Behavior: Behavior normal.      No results found for any visits on 03/05/19.     Assessment & Plan   Problem List Items Addressed This Visit  Cardiovascular and Mediastinum   Coronary atherosclerosis - Primary    Chronic and stable without angina Continue aspirin and beta-blocker      Relevant Medications   pravastatin (PRAVACHOL) 20 MG tablet   metoprolol succinate (TOPROL-XL) 50 MG 24 hr tablet   furosemide (LASIX) 40 MG tablet   Chronic diastolic heart failure (HCC)    Chronic and stable Denies any swelling or shortness of breath Continue Lasix daily Recheck metabolic panel      Relevant Medications   pravastatin (PRAVACHOL) 20 MG tablet   metoprolol succinate (TOPROL-XL) 50 MG 24 hr tablet   furosemide (LASIX) 40 MG tablet   Essential hypertension    Chronic and well-controlled Continue metoprolol and Lasix daily Recheck metabolic panel      Relevant Medications   pravastatin (PRAVACHOL) 20 MG tablet   metoprolol succinate (TOPROL-XL) 50 MG 24 hr tablet   furosemide (LASIX) 40 MG tablet   Other Relevant Orders   Comprehensive metabolic panel     Respiratory   COPD (chronic obstructive pulmonary disease) (HCC)    Chronic and stable Gold grade D O2 dependent at 2 L/min Continue Advair and albuterol as needed May benefit from triple therapy, but patient is hesitant to change anything as he is stable at this time Has previously seen pulmonology and done [facility] rehab but not for several years      Relevant Medications   Fluticasone-Salmeterol (ADVAIR) 250-50 MCG/DOSE AEPB   albuterol  (VENTOLIN HFA) 108 (90 Base) MCG/ACT inhaler   Chronic hypoxemic respiratory failure (HCC)    Secondary to COPD Continuous O2 at 2 L/min Respiratory status is stable        Genitourinary   Chronic kidney disease (CKD), stage III (moderate) (HCC)    Chronic Avoid NSAIDs Renally adjust medications Recheck metabolic panel      Relevant Orders   Comprehensive metabolic panel     Other   Elevated blood sugar    Recheck hemoglobin A1c      Relevant Orders   Hemoglobin A1c   HLD (hyperlipidemia)    Previously well controlled Goal LDL less than 70 Repeat CMP and lipid panel Continue pravastatin      Relevant Medications   pravastatin (PRAVACHOL) 20 MG tablet   metoprolol succinate (TOPROL-XL) 50 MG 24 hr tablet   furosemide (LASIX) 40 MG tablet   Other Relevant Orders   Comprehensive metabolic panel   Lipid panel   Smoking 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 pack years    Patient stopped smoking more than 15 years ago He has never had lung cancer or AAA screening, but he is aged out of these recommendations He is using chewing tobacco which we discussed the risks of       Other Visit Diagnoses    Chronic diastolic congestive heart failure (HCC)       Relevant Medications   pravastatin (PRAVACHOL) 20 MG tablet   metoprolol succinate (TOPROL-XL) 50 MG 24 hr tablet   furosemide (LASIX) 40 MG tablet   Paresthesias       Relevant Medications   gabapentin (NEURONTIN) 300 MG capsule   Anemia of chronic disease       Relevant Orders   CBC w/Diff/Platelet   Overweight           Return in about 6 months (around 09/05/2019) for CPE/AWV.   The entirety of the information documented in the History of Present Illness, Review of Systems and Physical Exam were personally obtained by me. Portions of this information  were initially documented by Tiburcio Pea, CMA and reviewed by me for thoroughness and accuracy.    Exzavier Ruderman, Dionne Bucy, MD MPH [facility]

## 2019-03-05 NOTE — Assessment & Plan Note (Signed)
Chronic and stable without angina Continue aspirin and beta-blocker

## 2019-03-05 NOTE — Assessment & Plan Note (Signed)
Previously well controlled Goal LDL less than 70 Repeat CMP and lipid panel Continue pravastatin

## 2019-03-05 NOTE — Assessment & Plan Note (Signed)
Chronic and well-controlled Continue metoprolol and Lasix daily Recheck metabolic panel

## 2019-03-05 NOTE — Assessment & Plan Note (Signed)
Recheck hemoglobin A1c

## 2019-03-05 NOTE — Assessment & Plan Note (Signed)
Patient stopped smoking more than 15 years ago He has never had lung cancer or AAA screening, but he is aged out of these recommendations He is using chewing tobacco which we discussed the risks of

## 2019-03-05 NOTE — Assessment & Plan Note (Signed)
Secondary to COPD Continuous O2 at 2 L/min Respiratory status is stable

## 2019-03-05 NOTE — Assessment & Plan Note (Signed)
Chronic and stable Denies any swelling or shortness of breath Continue Lasix daily Recheck metabolic panel

## 2019-03-05 NOTE — Assessment & Plan Note (Signed)
Chronic and stable Gold grade D O2 dependent at 2 L/min Continue Advair and albuterol as needed May benefit from triple therapy, but patient is hesitant to change anything as he is stable at this time Has previously seen pulmonology and done [facility] rehab but not for several years

## 2019-03-06 DIAGNOSIS — D638 Anemia in other chronic diseases classified elsewhere: Secondary | ICD-10-CM | POA: Diagnosis not present

## 2019-03-06 DIAGNOSIS — I10 Essential (primary) hypertension: Secondary | ICD-10-CM | POA: Diagnosis not present

## 2019-03-06 DIAGNOSIS — R739 Hyperglycemia, unspecified: Secondary | ICD-10-CM | POA: Diagnosis not present

## 2019-03-06 DIAGNOSIS — E782 Mixed hyperlipidemia: Secondary | ICD-10-CM | POA: Diagnosis not present

## 2019-03-06 DIAGNOSIS — N183 Chronic kidney disease, stage 3 (moderate): Secondary | ICD-10-CM | POA: Diagnosis not present

## 2019-03-07 LAB — CBC WITH DIFFERENTIAL/PLATELET
Basophils Absolute: 0 10*3/uL (ref 0.0–0.2)
Basos: 0 %
EOS (ABSOLUTE): 0.3 10*3/uL (ref 0.0–0.4)
Eos: 3 %
Hematocrit: 41.8 % (ref 37.5–51.0)
Hemoglobin: 13 g/dL (ref 13.0–17.7)
Immature Grans (Abs): 0 10*3/uL (ref 0.0–0.1)
Immature Granulocytes: 0 %
Lymphocytes Absolute: 2.4 10*3/uL (ref 0.7–3.1)
Lymphs: 20 %
MCH: 27.4 pg (ref 26.6–33.0)
MCHC: 31.1 g/dL — ABNORMAL LOW (ref 31.5–35.7)
MCV: 88 fL (ref 79–97)
Monocytes Absolute: 0.7 10*3/uL (ref 0.1–0.9)
Monocytes: 5 %
Neutrophils Absolute: 8.6 10*3/uL — ABNORMAL HIGH (ref 1.4–7.0)
Neutrophils: 72 %
Platelets: 285 10*3/uL (ref 150–450)
RBC: 4.75 x10E6/uL (ref 4.14–5.80)
RDW: 14.7 % (ref 11.6–15.4)
WBC: 12 10*3/uL — ABNORMAL HIGH (ref 3.4–10.8)

## 2019-03-07 LAB — LIPID PANEL
Chol/HDL Ratio: 3.1 ratio (ref 0.0–5.0)
Cholesterol, Total: 105 mg/dL (ref 100–199)
HDL: 34 mg/dL — ABNORMAL LOW (ref >39)
LDL Calculated: 55 mg/dL (ref 0–99)
Triglycerides: 81 mg/dL (ref 0–149)
VLDL Cholesterol Cal: 16 mg/dL (ref 5–40)

## 2019-03-07 LAB — COMPREHENSIVE METABOLIC PANEL
ALT: 8 IU/L (ref 0–44)
AST: 9 IU/L (ref 0–40)
Albumin/Globulin Ratio: 1.5 (ref 1.2–2.2)
Albumin: 3.5 g/dL — ABNORMAL LOW (ref 3.7–4.7)
Alkaline Phosphatase: 110 IU/L (ref 39–117)
BUN/Creatinine Ratio: 5 — ABNORMAL LOW (ref 10–24)
BUN: 8 mg/dL (ref 8–27)
Bilirubin Total: 0.5 mg/dL (ref 0.0–1.2)
CO2: 24 mmol/L (ref 20–29)
Calcium: 8.9 mg/dL (ref 8.6–10.2)
Chloride: 105 mmol/L (ref 96–106)
Creatinine, Ser: 1.53 mg/dL — ABNORMAL HIGH (ref 0.76–1.27)
GFR calc Af Amer: 49 mL/min/{1.73_m2} — ABNORMAL LOW (ref >59)
GFR calc non Af Amer: 42 mL/min/{1.73_m2} — ABNORMAL LOW (ref >59)
Globulin, Total: 2.4 g/dL (ref 1.5–4.5)
Glucose: 107 mg/dL — ABNORMAL HIGH (ref 65–99)
Potassium: 4.5 mmol/L (ref 3.5–5.2)
Sodium: 143 mmol/L (ref 134–144)
Total Protein: 5.9 g/dL — ABNORMAL LOW (ref 6.0–8.5)

## 2019-03-07 LAB — HEMOGLOBIN A1C
Est. average glucose Bld gHb Est-mCnc: 148 mg/dL
Hgb A1c MFr Bld: 6.8 % — ABNORMAL HIGH (ref 4.8–5.6)

## 2019-03-08 ENCOUNTER — Telehealth: Payer: Self-pay | Admitting: Family Medicine

## 2019-03-08 ENCOUNTER — Telehealth: Payer: Self-pay

## 2019-03-08 DIAGNOSIS — N183 Chronic kidney disease, stage 3 unspecified: Secondary | ICD-10-CM

## 2019-03-08 NOTE — Telephone Encounter (Signed)
-----   Message from Virginia Crews, MD sent at 03/07/2019  5:00 PM EDT ----- A1c is at goal.  Normal electrolytes.  Well-controlled cholesterol.  White blood cell count is elevated again.  Any recent steroid use?  Any recent illness?  Kidney function is also elevated from baseline.  Be sure to stay hydrated and avoid NSAIDs.  Recheck CBC and renal function panel in 1 month

## 2019-03-08 NOTE — Telephone Encounter (Signed)
Pt needing a call back on recent labs done.  Thanks, [employer]

## 2019-03-08 NOTE — Telephone Encounter (Signed)
Left message to call back regarding lab results.

## 2019-03-11 ENCOUNTER — Telehealth: Payer: Self-pay

## 2019-03-11 NOTE — Telephone Encounter (Signed)
Patient's wife notified of lab results. She states that he took Prednisone about two months ago, he has not been sick. Explained to her that he needs to hydrate, she states that he drinks plenty of [facility], but little water. I explained that he needs to drink more water and less carbonated drinks. He will repeat labs in 1 month.

## 2019-03-11 NOTE — Telephone Encounter (Signed)
-----   Message from Virginia Crews, MD sent at 03/07/2019  5:00 PM EDT ----- A1c is at goal.  Normal electrolytes.  Well-controlled cholesterol.  White blood cell count is elevated again.  Any recent steroid use?  Any recent illness?  Kidney function is also elevated from baseline.  Be sure to stay hydrated and avoid NSAIDs.  Recheck CBC and renal function panel in 1 month

## 2019-03-11 NOTE — Telephone Encounter (Signed)
noted 

## 2019-04-02 DIAGNOSIS — J449 Chronic obstructive pulmonary disease, unspecified: Secondary | ICD-10-CM | POA: Diagnosis not present

## 2019-04-05 ENCOUNTER — Other Ambulatory Visit: Payer: Self-pay | Admitting: Family Medicine

## 2019-04-05 DIAGNOSIS — I5032 Chronic diastolic (congestive) heart failure: Secondary | ICD-10-CM

## 2019-04-05 DIAGNOSIS — E782 Mixed hyperlipidemia: Secondary | ICD-10-CM

## 2019-04-05 DIAGNOSIS — R202 Paresthesia of skin: Secondary | ICD-10-CM

## 2019-04-05 MED ORDER — FUROSEMIDE 40 MG PO TABS: 40.0000 mg | ORAL_TABLET | Freq: Every day | ORAL | 3 refills | Status: DC

## 2019-04-05 MED ORDER — GABAPENTIN 300 MG PO CAPS: 300.0000 mg | ORAL_CAPSULE | Freq: Two times a day (BID) | ORAL | 3 refills | Status: DC

## 2019-04-05 MED ORDER — PRAVASTATIN SODIUM 20 MG PO TABS: 20.0000 mg | ORAL_TABLET | Freq: Every day | ORAL | 3 refills | Status: DC

## 2019-04-05 MED ORDER — TAMSULOSIN HCL 0.4 MG PO CAPS: 0.4000 mg | ORAL_CAPSULE | Freq: Every day | ORAL | 3 refills | Status: DC

## 2019-04-09 DIAGNOSIS — N183 Chronic kidney disease, stage 3 (moderate): Secondary | ICD-10-CM | POA: Diagnosis not present

## 2019-04-09 NOTE — Addendum Note (Signed)
Addended by: Dan Maker on: 04/09/2019 08:43 AM   Modules accepted: Orders

## 2019-04-10 LAB — CBC WITH DIFFERENTIAL/PLATELET
Basophils Absolute: 0 10*3/uL (ref 0.0–0.2)
Basos: 0 %
EOS (ABSOLUTE): 0.2 10*3/uL (ref 0.0–0.4)
Eos: 2 %
Hematocrit: 40.2 % (ref 37.5–51.0)
Hemoglobin: 13.2 g/dL (ref 13.0–17.7)
Immature Grans (Abs): 0 10*3/uL (ref 0.0–0.1)
Immature Granulocytes: 0 %
Lymphocytes Absolute: 2.4 10*3/uL (ref 0.7–3.1)
Lymphs: 24 %
MCH: 28.4 pg (ref 26.6–33.0)
MCHC: 32.8 g/dL (ref 31.5–35.7)
MCV: 87 fL (ref 79–97)
Monocytes Absolute: 0.7 10*3/uL (ref 0.1–0.9)
Monocytes: 7 %
Neutrophils Absolute: 6.5 10*3/uL (ref 1.4–7.0)
Neutrophils: 67 %
Platelets: 285 10*3/uL (ref 150–450)
RBC: 4.65 x10E6/uL (ref 4.14–5.80)
RDW: 14.7 % (ref 11.6–15.4)
WBC: 9.8 10*3/uL (ref 3.4–10.8)

## 2019-04-10 LAB — RENAL FUNCTION PANEL
Albumin: 3.6 g/dL — ABNORMAL LOW (ref 3.7–4.7)
BUN/Creatinine Ratio: 6 — ABNORMAL LOW (ref 10–24)
BUN: 10 mg/dL (ref 8–27)
CO2: 27 mmol/L (ref 20–29)
Calcium: 8.7 mg/dL (ref 8.6–10.2)
Chloride: 102 mmol/L (ref 96–106)
Creatinine, Ser: 1.67 mg/dL — ABNORMAL HIGH (ref 0.76–1.27)
GFR calc Af Amer: 44 mL/min/{1.73_m2} — ABNORMAL LOW (ref >59)
GFR calc non Af Amer: 38 mL/min/{1.73_m2} — ABNORMAL LOW (ref >59)
Glucose: 101 mg/dL — ABNORMAL HIGH (ref 65–99)
Phosphorus: 3.5 mg/dL (ref 2.8–4.1)
Potassium: 3.9 mmol/L (ref 3.5–5.2)
Sodium: 144 mmol/L (ref 134–144)

## 2019-04-11 ENCOUNTER — Telehealth: Payer: Self-pay

## 2019-04-11 NOTE — Telephone Encounter (Signed)
Patient was advised.  

## 2019-04-11 NOTE — Telephone Encounter (Signed)
-----   Message from Virginia Crews, MD sent at 04/10/2019  4:57 PM EDT ----- Stable labs

## 2019-04-15 ENCOUNTER — Telehealth: Payer: Self-pay | Admitting: Family Medicine

## 2019-04-15 DIAGNOSIS — J9611 Chronic respiratory failure with hypoxia: Secondary | ICD-10-CM

## 2019-04-15 DIAGNOSIS — R739 Hyperglycemia, unspecified: Secondary | ICD-10-CM

## 2019-04-15 DIAGNOSIS — I5032 Chronic diastolic (congestive) heart failure: Secondary | ICD-10-CM

## 2019-04-15 DIAGNOSIS — J432 Centrilobular emphysema: Secondary | ICD-10-CM

## 2019-04-15 NOTE — Telephone Encounter (Signed)
Does she want a pulmonology referral?  I doubt that they can make the insurance cover this any better.  Maybe CCM would be the most helpful.  We can offer a referral to CCM.

## 2019-04-15 NOTE — Telephone Encounter (Signed)
[place] asked pt's wife to call Dr. B to let her know pt is needing help with his oxygen cost.  The cost keeps rising.  Pt's wife thinking a referral is what they are telling her she needs to ask for from Dr. B.  Call before 5 pm today if possible.  Please call [name] back at [phone].  Thanks, [employer]

## 2019-04-16 NOTE — Telephone Encounter (Signed)
Patient's wife [name]) was advised. [name] states that the referral could be placed for CCM and pulmonology. Please advise.

## 2019-04-16 NOTE — Telephone Encounter (Signed)
Referrals placed.  Patient can expect calls from both

## 2019-04-16 NOTE — Telephone Encounter (Signed)
Pt's wife calling back on oxygen cost.  Please call her back and advise.  Thanks, [employer]

## 2019-04-22 ENCOUNTER — Ambulatory Visit: Payer: Self-pay | Admitting: *Deleted

## 2019-04-22 NOTE — Chronic Care Management (AMB) (Signed)
  Chronic Care Management   Outreach Note  04/22/2019 Name: Bobby Peterson MRN: [id number] DOB: [birth year]  Referred by: Virginia Crews, MD Reason for referral : Chronic Care Management (New Referral DME)   An unsuccessful telephone outreach was attempted today. The patient was referred to the case management team by her PCP for assistance with chronic care management and care coordination.   Follow Up Plan: A HIPPA compliant phone message was left for the patient providing contact information and requesting a return call.  The care management team will reach out to the patient again over the next 2 days.   [facility] [phone]

## 2019-04-24 ENCOUNTER — Encounter: Payer: Self-pay | Admitting: Pulmonary Disease

## 2019-04-24 ENCOUNTER — Other Ambulatory Visit: Payer: Self-pay

## 2019-04-24 ENCOUNTER — Ambulatory Visit: Payer: Medicare HMO | Admitting: Pulmonary Disease

## 2019-04-24 VITALS — BP 110/76 | HR 110 | Temp 97.4°F | Ht 65.0 in | Wt 170.6 lb

## 2019-04-24 DIAGNOSIS — R0602 Shortness of breath: Secondary | ICD-10-CM | POA: Diagnosis not present

## 2019-04-24 DIAGNOSIS — Z7189 Other specified counseling: Secondary | ICD-10-CM

## 2019-04-24 DIAGNOSIS — Z9981 Dependence on supplemental oxygen: Secondary | ICD-10-CM | POA: Diagnosis not present

## 2019-04-24 DIAGNOSIS — J9611 Chronic respiratory failure with hypoxia: Secondary | ICD-10-CM | POA: Diagnosis not present

## 2019-04-24 DIAGNOSIS — J449 Chronic obstructive pulmonary disease, unspecified: Secondary | ICD-10-CM

## 2019-04-24 MED ORDER — IPRATROPIUM-ALBUTEROL 0.5-2.5 (3) MG/3ML IN SOLN: 3.0000 mL | Freq: Four times a day (QID) | RESPIRATORY_TRACT | 11 refills | Status: DC | PRN

## 2019-04-24 NOTE — Patient Instructions (Addendum)
1.  We will switch you to nebulizer medications this will be DuoNeb (ipratropium-albuterol) 1 vial via nebulizer 4 times a day.  2.  Put Advair aside for now do not use.  3.  We are placing a consult to Palliative Care.  4.  We are getting a heart test done to check on your heart.  5.  You definitely continue to qualify for oxygen.  6.  We will see you in follow-up in 6 to 8 weeks time.

## 2019-04-24 NOTE — Progress Notes (Signed)
Subjective:    Patient ID: Bobby Peterson, male    DOB: [birth year], 78 y.o.   MRN: [id number]  HPI Bobby Peterson is an 78 year old former smoker (quit 12/04/2003) with a 50-pack-year history of smoking previously.  He has been diagnosed with COPD stage D at a previous evaluation by Dr. Lake Bells on 12/03/2013.  He is referred by Dr. Lavon Paganini, for evaluation and management of his COPD.  Apparently the main reason is because of issues with his DME company and oxygen supplementation.  The patient presents with his wife today.  He is wearing oxygen at 2 L/min with oxygen saturation barely on the 90% range.  He has been on oxygen supplementation for at least 5 years.  He began to have symptoms in late 2014 when he was noticing shortness of breath with minimal activity such as performing activities of daily living or taking a shower.  He has had several admissions to [facility] for COPD exacerbations.  He has actually had intubation and mechanical ventilation for a COPD exacerbation previously.  The patient and his wife are more concerned with her oxygen bill than anything else.  Patient apparently has been on Advair 250/50 for a number of years but the wife does note that he does not appear to get his medication in she has to urge him to take a deep breath but she states that he cannot.  When asked what can I do today to help him he fixates on the oxygen bill and also on the fact that he gets significant dysuria when urinating.  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  However, the antibiotic chosen is Macrobid (nitrofurantoin) unfortunately this medication is fraught with severe pulmonary side effects in particular interstitial lung disease and fibrosis that can be irreversible.  He has been evaluated by palliative care in the past of note on 02/15/2018.  He had declined services at that time.  The only quantitation of FEV1  that I note is in 2015 where he was noted to have an FEV1 of 0.9 L or 34% predicted with an FEV1/FVC of 45% this puts him in the very severe category.  Past medical history, surgical history and family history have been reviewed.  Social history, the patient is retired he quit smoking cigarettes in 2005 but has a 50-pack-year history prior to that.  He does chew tobacco.     Review of Systems  Constitutional: Positive for activity change (Less active due to severe dyspnea) and fatigue.  HENT: Negative.   Eyes: Negative.   Respiratory: Positive for cough, chest tightness, shortness of breath and wheezing.   Cardiovascular: Negative.   Gastrointestinal: Negative.   Endocrine: Negative.   Genitourinary: Positive for difficulty urinating, dysuria and frequency.  Musculoskeletal: Negative.   Skin: Negative.   Allergic/Immunologic: Negative.   Neurological: Positive for weakness.  Hematological: Negative.   Psychiatric/Behavioral: Negative.   All other systems reviewed and are negative.      Objective:   Physical Exam Constitutional:      Appearance: He is normal weight. He is ill-appearing (Chronically ill).     Interventions: Nasal cannula in place.     Comments: Disheveled appearance, cantankerous.  In wheelchair due to severe dyspnea on exertion.  HENT:     Head: Normocephalic and atraumatic.     Right Ear: External ear normal.     Left Ear: External ear normal.  Nose:     Comments: Nose/mouth/throat not examined due to masking requirements for COVID 19. Eyes:     General: No scleral icterus.    Conjunctiva/sclera: Conjunctivae normal.     Pupils: Pupils are equal, round, and reactive to light.  Neck:     Musculoskeletal: Neck supple.     Thyroid: No thyromegaly.     Vascular: No JVD.     Trachea: Trachea and phonation normal.  Cardiovascular:     Rate and Rhythm: Regular rhythm. Tachycardia present.     Pulses: Normal pulses.     Heart sounds: Normal heart sounds,  S1 normal and S2 normal.  Pulmonary:     Effort: Accessory muscle usage (Chronic use of accessories) and prolonged expiration present. No respiratory distress.     Breath sounds: Decreased air movement present. Examination of the right-middle field reveals wheezing. Examination of the left-middle field reveals wheezing. Decreased breath sounds (Diffuse distant breath sounds) and wheezing present. No rhonchi or rales.  Chest:     Chest wall: Deformity (Increased AP diameter) present.  Abdominal:     General: Abdomen is protuberant. There is no distension.     Tenderness: There is no abdominal tenderness.  Musculoskeletal: Normal range of motion.     Right lower leg: 1+ Edema present.     Left lower leg: 1+ Edema present.  Lymphadenopathy:     Cervical: No cervical adenopathy.  Skin:    General: Skin is warm and dry.     Findings: Bruising (Multiple ecchymoses.) present.  Neurological:     General: No focal deficit present.     Mental Status: He is alert and oriented to person, place, and time.  Psychiatric:        Mood and Affect: Affect is angry.        Speech: Speech normal.           Assessment & Plan:   1.  Severe/end-stage COPD: Note that he had quite severe disease in 2015 when his FEV1 was measured 0.9 L or 34% predicted.  Presently I do not think that he can perform PFTs due to further deterioration of his lung function on clinical grounds.  The patient does not appear to be well optimized with regards to his COPD he is relying on metered-dose inhalers and dry powder inhalers and unfortunately he does not have the breath-holding capacity to perform the maneuvers required to activate these inhalers.  This is confirmed by the patient's wife.  Patient gets frustrated using his inhalers and quit using them due to the inability to get proper response.  We will try to switch him to medications via nebulized delivery we will switch him to DuoNeb 1 vial via nebulizer 4 times a day with  up to 5 times a day if needed.  2.  Chronic hypoxemic respiratory failure: The patient has saturations between 89 to 90% on 2 L/min nasal cannula O2.  Upon standing off of oxygen the patient desaturated to 86%.  He was unable to ambulate due to generalized weakness and dyspnea upon desaturation.  He continues to qualify for oxygen at 2 L/min.  Since their issues are related to affording the co-pays for the oxygen they were reminded that they could have this issue reexamined by the DME company.  I reminded the patient and his wife that these are determined by Medicare and are beyond our control however, if they are in financial distress they can have co-pays waived but they need to communicate  with the DME company directly.  3.  Dyspnea: Likely related to severe COPD as above, also poorly compensated state.management of COPD as above.  Also please note that the patient is on Macrodantin, this medication can cause interstitial lung disease that can be reversible but on occasion noted to be irreversible.  Given the fact that the patient has severe COPD it would be prudent to discontinue Macrodantin altogether.  Consider other agents to use as antibiotic prophylaxis.  We will also obtain 2D echo to evaluate for the potential for cor pulmonale.  4.  End-of-life issues,advanced directives and goals of care discussion: I had an end-of-life discussion with the patient and his wife.  They were somewhat reluctant to engage in the conversation however I have asked him to consider palliative care.  From the medical standpoint there is very little else to provide to the patient and management will be purely for palliative purposes as his disease processes by now incurable.  I have referred him to Palliative Care to help with establishing goals of care and to help manage disease symptoms.  We will see the patient in follow-up in 6 to 8 weeks time.  He is to contact us prior to that time should any new difficulties arise.   Total time of this visit 60 minutes  This chart was dictated using voice recognition software/Dragon.  Despite best efforts to proofread, errors can occur which can change the meaning.  Any change was purely unintentional.

## 2019-04-25 ENCOUNTER — Encounter: Payer: Self-pay | Admitting: Pulmonary Disease

## 2019-04-25 DIAGNOSIS — J449 Chronic obstructive pulmonary disease, unspecified: Secondary | ICD-10-CM | POA: Diagnosis not present

## 2019-04-26 ENCOUNTER — Telehealth: Payer: Self-pay | Admitting: Nurse Practitioner

## 2019-04-26 ENCOUNTER — Telehealth: Payer: Self-pay | Admitting: Pulmonary Disease

## 2019-04-26 NOTE — Telephone Encounter (Signed)
Spoke with patient regarding Palliative services and he has declined at this time stating that he doesn't feel like needs.  He stated that he is still able to do for himself and get around.  I told him that if he changes his mind and later on and wants Palliative services I instructed him to call the MD office and let them know and he was in agreement with this.

## 2019-04-26 NOTE — Telephone Encounter (Signed)
Noted. Will route message to Dr. Darnell Level as Juluis Rainier.

## 2019-04-29 NOTE — Telephone Encounter (Signed)
This is unfortunate as he really should be under hospice/palliative care.

## 2019-04-30 NOTE — Telephone Encounter (Signed)
Called and spoke to pt's spouse, [name] ([place]).  [name] stated that pt did not wish to proceed with palliative care, as he did not want anyone in his house. He feels that his wife can continue to take care of him.  I attempted to better explain palliative care and why this was suggested for pt. Linda nor pt still did not wish to proceed.

## 2019-05-01 ENCOUNTER — Ambulatory Visit: Payer: Self-pay | Admitting: *Deleted

## 2019-05-01 DIAGNOSIS — J9611 Chronic respiratory failure with hypoxia: Secondary | ICD-10-CM

## 2019-05-01 DIAGNOSIS — J441 Chronic obstructive pulmonary disease with (acute) exacerbation: Secondary | ICD-10-CM

## 2019-05-01 NOTE — Chronic Care Management (AMB) (Signed)
Chronic Care Management   Initial Visit Note  05/01/2019 Name: Bobby Peterson MRN: [id number] DOB: 1940-08-14  Referred by: Virginia Crews, MD Reason for referral : Chronic Care Management (New Referral; DME needs)   DASHUN BORRE is a 78 y.o. year old male who is a primary care patient of Bacigalupo, Dionne Bucy, MD. The CCM team was consulted for assistance with chronic disease management and care coordination needs.   Review of patient status, including review of consultants reports, relevant laboratory and other test results, and collaboration with appropriate care team members and the patient's provider was performed as part of comprehensive patient evaluation and provision of chronic care management services.    SDOH (Social Determinants of Health) screening performed today: Advanced Directives. See Care Plan for related entries.   Advanced Directives Status: N See Care Plan and Vynca application for related entries.   Medications: Outpatient Encounter Medications as of 05/01/2019  Medication Sig  . albuterol (VENTOLIN HFA) 108 (90 Base) MCG/ACT inhaler Inhale into the lungs every 6 (six) hours as needed for wheezing or shortness of breath.  [name] aspirin 81 MG tablet Take 81 mg by mouth daily.  . Fluticasone-Salmeterol (ADVAIR) 250-50 MCG/DOSE AEPB Inhale 1 puff into the lungs 2 (two) times daily.  . furosemide (LASIX) 40 MG tablet Take 1 tablet (40 mg total) by mouth daily.  [name] gabapentin (NEURONTIN) 300 MG capsule Take 1 capsule (300 mg total) by mouth 2 (two) times daily.  . hydrocortisone (ANUSOL-HC) 25 MG suppository Place 1 suppository (25 mg total) rectally 2 (two) times daily as needed for hemorrhoids or anal itching.  [name] ipratropium-albuterol (DUONEB) 0.5-2.5 (3) MG/3ML SOLN Take 3 mLs by nebulization 4 (four) times daily as needed.  . metoprolol succinate (TOPROL-XL) 50 MG 24 hr tablet TAKE 3 TABLETS (150 MG TOTAL)  DAILY. TAKE WITH OR IMMEDIATELY FOLLOWING A MEAL.  .  nitrofurantoin, macrocrystal-monohydrate, (MACROBID) 100 MG capsule Take 1 capsule (100 mg total) by mouth at bedtime.  [name] omeprazole (PRILOSEC) 40 MG capsule TAKE 1 CAPSULE DAILY BEFORE BREAKFAST.  [name] OXYGEN Inhale 2 L/min into the lungs continuous.   . pravastatin (PRAVACHOL) 20 MG tablet Take 1 tablet (20 mg total) by mouth daily.  . tamsulosin (FLOMAX) 0.4 MG CAPS capsule Take 1 capsule (0.4 mg total) by mouth daily.  . vitamin B-12 (CYANOCOBALAMIN) 1000 MCG tablet Take 1,000 mcg by mouth once a week. *Take on Monday*   No facility-administered encounter medications on file as of 05/01/2019.      Objective: 78 year old with Dx COPD Gold Stage D and chronic hypoxic respiratory failure who declines hospice/palliative care at this time.   Goals Addressed            This Visit's Progress   . "I'm going to take care of him and I'll let you all know if we need any help" (pt-stated)       Current Barriers:  [name] Knowledge Deficits related to management of COPD and Chronic Hypoxic Respiratory Failure - I spoke with patient's spouse [name] today to offer CCM services and discuss the need for support around management of COPD and Respiratory Failure. Spouse/caregiver indicated "we have the oxygen here and I am taking care of him and I will call you if I need your help. I appreciate you offering to help me. It's okay for you to call us but we don't need anything in particular right this second." . Chronic Disease Management support and education needs related to  COPD/CHRF  Nurse Case Manager Clinical Goal(s):  [name] Over the next 90 days, patient will work with care management team to address needs related to management of COPD/Chronic Respiratory Failure  Interventions:  . Evaluation of current treatment plan related to COPD and Chronic Respiratory Failure and patient's adherence to plan as established by provider. . Provided education to patient re: offer of palliative/hospice services and other  community and in home care resources to support patient/caregiver with management of advanced pulmonary disease . Discussed plans with patient for ongoing care management follow up and provided patient with direct contact information for care management team; patient/spouse agreed to ongoing telephone outreach and support  Patient Self Care Activities:  . Calls pharmacy for medication refills . Does not perform ADL's independently - requires assistance of spouse  Initial goal documentation     . Advanced Care Planning       Current Barriers:  . No Advanced Directives in place  Nurse Case Manager Clinical Goal(s):  [name] Over the next 30 days, the patient will work with the care management team towards completion of advanced directives; patient/spouse decline AD conversation at this time but willing to receive telephone outreach in the future to address  Interventions:  . Provided education to patient re: Advanced Care Planning and rationale for completion  Patient Self Care Activities:  . Calls provider office for new concerns or questions . Does not adhere to provider recommendations regarding support through hospice/palliative care  Initial goal documentation         Mr. Shives was given information about Chronic Care Management services today including:  1. CCM service includes personalized support from designated clinical staff supervised by his physician, including individualized plan of care and coordination with other care providers 2. 24/7 contact phone numbers for assistance for urgent and routine care needs. 3. Service will only be billed when office clinical staff spend 20 minutes or more in a month to coordinate care. 4. Only one practitioner may furnish and bill the service in a calendar month. 5. The patient may stop CCM services at any time (effective at the end of the month) by phone call to the office staff. 6. The patient will be responsible for cost sharing (co-pay)  of up to 20% of the service fee (after annual deductible is met).  Patient agreed to services and verbal consent obtained.   Plan:   The care management team will reach out to the patient again over the next 30 days.   [facility] [phone]

## 2019-05-01 NOTE — Patient Instructions (Signed)
Visit Information  Goals Addressed            This Visit's Progress   . "I'm going to take care of him and I'll let you all know if we need any help" (pt-stated)       Current Barriers:  [name] Knowledge Deficits related to management of COPD and Chronic Hypoxic Respiratory Failure - I spoke with patient's spouse [name] today to offer CCM services and discuss the need for support around management of COPD and Respiratory Failure. Spouse/caregiver indicated "we have the oxygen here and I am taking care of him and I will call you if I need your help. I appreciate you offering to help me. It's okay for you to call us but we don't need anything in particular right this second." . Chronic Disease Management support and education needs related to COPD/CHRF  Nurse Case Manager Clinical Goal(s):  [name] Over the next 90 days, patient will work with care management team to address needs related to management of COPD/Chronic Respiratory Failure  Interventions:  . Evaluation of current treatment plan related to COPD and Chronic Respiratory Failure and patient's adherence to plan as established by provider. . Provided education to patient re: offer of palliative/hospice services and other community and in home care resources to support patient/caregiver with management of advanced pulmonary disease . Discussed plans with patient for ongoing care management follow up and provided patient with direct contact information for care management team; patient/spouse agreed to ongoing telephone outreach and support  Patient Self Care Activities:  . Calls pharmacy for medication refills . Does not perform ADL's independently - requires assistance of spouse  Initial goal documentation     . Advanced Care Planning       Current Barriers:  . No Advanced Directives in place  Nurse Case Manager Clinical Goal(s):  [name] Over the next 30 days, the patient will work with the care management team towards completion of advanced  directives; patient/spouse decline AD conversation at this time but willing to receive telephone outreach in the future to address  Interventions:  . Provided education to patient re: Advanced Care Planning and rationale for completion  Patient Self Care Activities:  . Calls provider office for new concerns or questions . Does not adhere to provider recommendations regarding support through hospice/palliative care  Initial goal documentation        Mr. Hyslop was given information about Chronic Care Management services today including:  1. CCM service includes personalized support from designated clinical staff supervised by his physician, including individualized plan of care and coordination with other care providers 2. 24/7 contact phone numbers for assistance for urgent and routine care needs. 3. Service will only be billed when office clinical staff spend 20 minutes or more in a month to coordinate care. 4. Only one practitioner may furnish and bill the service in a calendar month. 5. The patient may stop CCM services at any time (effective at the end of the month) by phone call to the office staff. 6. The patient will be responsible for cost sharing (co-pay) of up to 20% of the service fee (after annual deductible is met).  Patient agreed to services and verbal consent obtained.   The patient verbalized understanding of instructions provided today and declined a print copy of patient instruction materials.   The care management team will reach out to the patient again over the next 30 days.  [facility] (  [phone]  

## 2019-05-02 DIAGNOSIS — J449 Chronic obstructive pulmonary disease, unspecified: Secondary | ICD-10-CM | POA: Diagnosis not present

## 2019-05-03 ENCOUNTER — Other Ambulatory Visit: Payer: Self-pay

## 2019-05-03 ENCOUNTER — Ambulatory Visit
Admission: RE | Admit: 2019-05-03 | Discharge: 2019-05-03 | Disposition: A | Discharge status: Home / Self Care | Payer: Medicare HMO | Source: Ambulatory Visit | Attending: Pulmonary Disease | Admitting: Pulmonary Disease

## 2019-05-03 DIAGNOSIS — I272 Pulmonary hypertension, unspecified: Secondary | ICD-10-CM | POA: Insufficient documentation

## 2019-05-03 DIAGNOSIS — R0602 Shortness of breath: Secondary | ICD-10-CM

## 2019-05-03 DIAGNOSIS — J449 Chronic obstructive pulmonary disease, unspecified: Secondary | ICD-10-CM | POA: Diagnosis not present

## 2019-05-03 DIAGNOSIS — I10 Essential (primary) hypertension: Secondary | ICD-10-CM | POA: Diagnosis not present

## 2019-05-03 DIAGNOSIS — R0600 Dyspnea, unspecified: Secondary | ICD-10-CM | POA: Diagnosis not present

## 2019-05-03 NOTE — Progress Notes (Signed)
*  PRELIMINARY RESULTS* Echocardiogram 2D Echocardiogram has been performed.  Sherrie Sport 05/03/2019, 11:29 AM

## 2019-05-21 ENCOUNTER — Telehealth: Payer: Self-pay | Admitting: Family Medicine

## 2019-05-21 NOTE — Telephone Encounter (Signed)
Wife - [name] calling for pt.  Regarding ipratropium-albuterol (DUONEB) 0.5-2.5 (3) MG/3ML SOLN  Wanting to know if pt can go to 3 times a day instead of 4 because pt's medication doesn't last a month if he take it 4 times a day.  He cannot tell the difference between the 3 and 4 times a day.  Please call wife back at [phone] to advise.  Thanks, [employer]

## 2019-05-22 NOTE — Telephone Encounter (Signed)
It is fine to take TID for now.  We can change the prescription so it lasts longer if he needs to increase to QID

## 2019-05-22 NOTE — Telephone Encounter (Signed)
Linda advised.   Thanks,   -Jaleen Finch  

## 2019-05-25 DIAGNOSIS — J449 Chronic obstructive pulmonary disease, unspecified: Secondary | ICD-10-CM | POA: Diagnosis not present

## 2019-05-28 ENCOUNTER — Telehealth: Payer: Self-pay

## 2019-06-02 DIAGNOSIS — J449 Chronic obstructive pulmonary disease, unspecified: Secondary | ICD-10-CM | POA: Diagnosis not present

## 2019-06-05 ENCOUNTER — Other Ambulatory Visit: Payer: Self-pay

## 2019-06-05 ENCOUNTER — Encounter: Payer: Self-pay | Admitting: Pulmonary Disease

## 2019-06-05 ENCOUNTER — Ambulatory Visit: Payer: Medicare HMO | Admitting: Pulmonary Disease

## 2019-06-05 VITALS — BP 124/64 | HR 96 | Temp 98.2°F | Ht 65.0 in | Wt 176.0 lb

## 2019-06-05 DIAGNOSIS — J9611 Chronic respiratory failure with hypoxia: Secondary | ICD-10-CM

## 2019-06-05 DIAGNOSIS — R0602 Shortness of breath: Secondary | ICD-10-CM | POA: Diagnosis not present

## 2019-06-05 DIAGNOSIS — J449 Chronic obstructive pulmonary disease, unspecified: Secondary | ICD-10-CM | POA: Diagnosis not present

## 2019-06-05 DIAGNOSIS — Z7189 Other specified counseling: Secondary | ICD-10-CM

## 2019-06-05 MED ORDER — IPRATROPIUM-ALBUTEROL 0.5-2.5 (3) MG/3ML IN SOLN: 3.0000 mL | Freq: Four times a day (QID) | RESPIRATORY_TRACT | 11 refills | Status: DC | PRN

## 2019-06-05 NOTE — Progress Notes (Signed)
Subjective:    Patient ID: Bobby Peterson, male    DOB: 12/30/1940, 78 y.o.   MRN: [id number]  HPI Bobby Peterson is an 78 year old former smoker (quit 2005) with a 50-pack-year history of smoking who carries a diagnosis of COPD stage D.  He was previously evaluated by Dr. Roselie Awkward in 2015.  He has been oxygen dependent 2 L/min for over 5 years.  He uses a portable oxygen concentrator.  At his initial visit here with me on 23 September his oxygen saturations on supplemental oxygen at 2 L were barely 90%.  He was having much difficulty using his inhalers and it was evident that he did not have breath-holding capacity to get the medication well into his lungs.  We switched him to [facility] strictly.  He has been using DuoNeb 3-4 times a day and notes marked improvement on his symptoms of shortness of breath.  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  He is on chronic antibiotic suppression however the antibiotic chosen was Macrobid (nitrofurantoin).  Our prior evaluation we recommended that this be switched to a different agent given the devastating side effects that nitrofurantoin can have in the lung.   As noted the patient voices no complaints today.  Overall he is doing well within the limitations offered by his disease.  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   Has had no fevers, chills or sweats.  No chest pain.  No lower extremity edema.   Review of Systems A 10 point review of systems was performed and it is as noted above otherwise negative.    Objective:   Physical Exam Constitutional:      Appearance: He is normal weight. He is ill-appearing (Chronically ill).     Interventions: Nasal cannula in place.     Comments: Disheveled appearance, cantankerous.  In wheelchair due to severe dyspnea on exertion.  HENT:     Head: Normocephalic and atraumatic.     Right  Ear: External ear normal.     Left Ear: External ear normal.     Nose:     Comments: Nose/mouth/throat not examined due to masking requirements for COVID 19. Eyes:     General: No scleral icterus.    Conjunctiva/sclera: Conjunctivae normal.     Pupils: Pupils are equal, round, and reactive to light.  Neck:     Musculoskeletal: Neck supple.     Thyroid: No thyromegaly.     Vascular: No JVD.     Trachea: Trachea and phonation normal.  Cardiovascular:     Rate and Rhythm: Regular rhythm. Tachycardia present.     Pulses: Normal pulses.     Heart sounds: Normal heart sounds, S1 normal and S2 normal.  Pulmonary:     Effort: Accessory muscle usage (Chronic use of accessories) present. No prolonged expiration or respiratory distress.     Breath sounds: No decreased air movement. Decreased breath sounds (Diffuse distant breath sounds) present. No wheezing, rhonchi or rales.  Chest:     Chest wall: Deformity (Increased AP diameter) present.  Abdominal:     General: Abdomen is protuberant. There is no distension.     Tenderness: There is no abdominal tenderness.  Musculoskeletal: Normal range of motion.     Right lower leg: 1+ Edema present.     Left lower leg: 1+ Edema present.  Lymphadenopathy:     Cervical:  No cervical adenopathy.  Skin:    General: Skin is warm and dry.     Findings: Bruising (Multiple ecchymoses.) present.  Neurological:     General: No focal deficit present.     Mental Status: He is alert and oriented to person, place, and time.  Psychiatric:        Mood and Affect: Mood and affect normal.        Speech: Speech normal.        Assessment & Plan:   1.  End-stage COPD: Patient appears as compensated as he is to get on his current regiment of DuoNeb 3-4 times a day via nebulizer.  Previously on inhalers she could not get good results due to inability to breath-hold.  Both patient and wife note that nebulizer is easier for him to do.  Continue the same.  We will  see him in follow-up in 2 to 3 months time he is to contact us prior to that time should any new difficulties arise.  Note that the flu vaccine was offered to the patient however he adamantly refuses to have this.   2.  Chronic hypoxemic respiratory failure: Oxygen saturation markedly improved from prior.  96% on 2 L/min.  This reflects he is well compensated end-stage COPD state.   3.  Shortness of breath: This symptom also markedly improved with his nebulizer regimen.   4.  Discussed end-of-life issues again.  Patient had been referred to Palliative Care during his initial visit with me however he continues to decline this service.     This chart was dictated using voice recognition software/Dragon.  Despite best efforts to proofread, errors can occur which can change the meaning.  Any change was purely unintentional.    C. Derrill Kay, MD [facility]

## 2019-06-05 NOTE — Patient Instructions (Signed)
1.  Your heart looked good on this last echocardiogram.  This is likely because you are using your oxygen consistently.  2.  Continue using the nebulizers.  3.  We will see you in follow-up in 2 to 3 months time.

## 2019-06-18 ENCOUNTER — Telehealth: Payer: Self-pay

## 2019-06-20 ENCOUNTER — Ambulatory Visit (INDEPENDENT_AMBULATORY_CARE_PROVIDER_SITE_OTHER): Payer: Medicare HMO

## 2019-06-20 DIAGNOSIS — J441 Chronic obstructive pulmonary disease with (acute) exacerbation: Secondary | ICD-10-CM

## 2019-06-20 DIAGNOSIS — J9611 Chronic respiratory failure with hypoxia: Secondary | ICD-10-CM

## 2019-06-20 NOTE — Chronic Care Management (AMB) (Signed)
Chronic Care Management   Follow Up Note   06/20/2019 Name: Bobby Peterson MRN: [id number] DOB: 1941/01/13  Primary Care Provider: Virginia Crews, MD Reason for referral : Chronic Care Management   Bobby Peterson is a 78 y.o. year old male who is a primary care patient of Bobby Peterson, Bobby Bucy, MD. The CCM team was consulted for assistance with chronic disease management and care coordination needs.  A telephonic assessment was conducted today with Bobby Peterson and his spouse Bobby Peterson.  Review of patient status, including review of consultants reports, relevant laboratory and other test results, and collaboration with appropriate care team members and the patient's provider was performed as part of comprehensive patient evaluation and provision of chronic care management services.    SDOH (Social Determinants of Health) screening performed today.   Outpatient Encounter Medications as of 06/20/2019  Medication Sig  . albuterol (VENTOLIN HFA) 108 (90 Base) MCG/ACT inhaler Inhale into the lungs every 6 (six) hours as needed for wheezing or shortness of breath.  [name] aspirin 81 MG tablet Take 81 mg by mouth daily.  . furosemide (LASIX) 40 MG tablet Take 1 tablet (40 mg total) by mouth daily.  [name] gabapentin (NEURONTIN) 300 MG capsule Take 1 capsule (300 mg total) by mouth 2 (two) times daily.  [name] ipratropium-albuterol (DUONEB) 0.5-2.5 (3) MG/3ML SOLN Take 3 mLs by nebulization 4 (four) times daily as needed.  . metoprolol succinate (TOPROL-XL) 50 MG 24 hr tablet TAKE 3 TABLETS (150 MG TOTAL)  DAILY. TAKE WITH OR IMMEDIATELY FOLLOWING A MEAL.  . nitrofurantoin, macrocrystal-monohydrate, (MACROBID) 100 MG capsule Take 1 capsule (100 mg total) by mouth at bedtime.  [name] omeprazole (PRILOSEC) 40 MG capsule TAKE 1 CAPSULE DAILY BEFORE BREAKFAST.  [name] OXYGEN Inhale 2 L/min into the lungs continuous.   . pravastatin (PRAVACHOL) 20 MG tablet Take 1 tablet (20 mg total) by mouth daily.  . tamsulosin  (FLOMAX) 0.4 MG CAPS capsule Take 1 capsule (0.4 mg total) by mouth daily.  . vitamin B-12 (CYANOCOBALAMIN) 1000 MCG tablet Take 1,000 mcg by mouth once a week. *Take on Monday*  . hydrocortisone (ANUSOL-HC) 25 MG suppository Place 1 suppository (25 mg total) rectally 2 (two) times daily as needed for hemorrhoids or anal itching.   No facility-administered encounter medications on file as of 06/20/2019.      Goals Addressed            This Visit's Progress   . "I'm going to take care of him and I'll let you all know if we need any help" (pt-stated)       Current Barriers:  . 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 Over the next 90 days, patient will not be hospitalized for complications related to chronic illnesses. . Over the next 90 days, patient will take all medications as prescribed. . Over the next 90 days, patient will adhere to provider recommendations and attend scheduled medical appointments . Over the next 90 days, patient will follow recommended safety measures to prevent falls and injuries.  Interventions:  . Reviewed medications with patient's spouse Bobby Peterson. Discussed indications for use. Encouraged to ensure Bobby Peterson takes medications as prescribed. Encouraged to notify care management team with concerns regarding prescription costs. . Provided education/reviewed s/sx of complications related COPD/Chronic Respiratory failures.  Encouraged to adhere to provider's treatment recommendations. Discussed indications for seeking immediate medical attention. Reports that Bobby Peterson uses oxygen as needed and routinely monitors his oxygen saturation. Unable to recall oxygen settings. Reports O2 saturations have been within range. . Reviewed pending appointments.  Encouraged to attend appointments as scheduled to prevent delays in care. Encouraged to notify care management team if transportation assistance is needed. . Provided education regarding safety and fall prevention measures.    Patient Self Care Activities:  . Calls pharmacy for medication refills . Spouse assist with medication management . Spouse provides transportation    Please see past updates related to this goal by clicking on the "Past Updates" button in the selected goal         PLAN -Bobby Peterson declined need for frequent outreach from the care management team. Agreeable to outreach every three month and as needed. Encouraged to call with any health related concerns if needed.  -The care management team will follow up within the next three months.   Horris Latino [facility] [phone]

## 2019-06-25 DIAGNOSIS — J449 Chronic obstructive pulmonary disease, unspecified: Secondary | ICD-10-CM | POA: Diagnosis not present

## 2019-07-02 DIAGNOSIS — J449 Chronic obstructive pulmonary disease, unspecified: Secondary | ICD-10-CM | POA: Diagnosis not present

## 2019-07-18 ENCOUNTER — Ambulatory Visit: Payer: Self-pay

## 2019-07-18 ENCOUNTER — Other Ambulatory Visit: Payer: Self-pay

## 2019-07-25 DIAGNOSIS — J449 Chronic obstructive pulmonary disease, unspecified: Secondary | ICD-10-CM | POA: Diagnosis not present

## 2019-08-02 DIAGNOSIS — J449 Chronic obstructive pulmonary disease, unspecified: Secondary | ICD-10-CM | POA: Diagnosis not present

## 2019-08-13 ENCOUNTER — Other Ambulatory Visit: Payer: Self-pay | Admitting: Family Medicine

## 2019-08-13 DIAGNOSIS — J449 Chronic obstructive pulmonary disease, unspecified: Secondary | ICD-10-CM

## 2019-08-13 MED ORDER — IPRATROPIUM-ALBUTEROL 0.5-2.5 (3) MG/3ML IN SOLN: 3.0000 mL | Freq: Four times a day (QID) | RESPIRATORY_TRACT | 11 refills | Status: DC | PRN

## 2019-08-13 NOTE — Telephone Encounter (Signed)
[employer] faxed refill request for the following medications:  ipratropium-albuterol (DUONEB) 0.5-2.5 (3) MG/3ML SOLN   Please advise.  Thanks, [employer]

## 2019-08-22 ENCOUNTER — Telehealth: Payer: Self-pay

## 2019-08-22 DIAGNOSIS — J449 Chronic obstructive pulmonary disease, unspecified: Secondary | ICD-10-CM

## 2019-08-22 MED ORDER — IPRATROPIUM-ALBUTEROL 0.5-2.5 (3) MG/3ML IN SOLN: 3.0000 mL | Freq: Four times a day (QID) | RESPIRATORY_TRACT | 11 refills | Status: DC | PRN

## 2019-08-22 NOTE — Telephone Encounter (Signed)
Pt prescription sent to [facility].

## 2019-08-25 DIAGNOSIS — J449 Chronic obstructive pulmonary disease, unspecified: Secondary | ICD-10-CM | POA: Diagnosis not present

## 2019-09-02 DIAGNOSIS — J449 Chronic obstructive pulmonary disease, unspecified: Secondary | ICD-10-CM | POA: Diagnosis not present

## 2019-09-04 NOTE — Progress Notes (Signed)
Subjective:   Bobby Peterson is a 79 y.o. male who presents for an Initial Medicare Annual Wellness Visit.    This visit is being conducted through telemedicine due to the COVID-19 pandemic. This patient has given me verbal consent via doximity to conduct this visit, patient states they are participating from their home address. Some vital signs may be absent or patient reported.    Patient identification: identified by name, DOB, and current address  Review of Systems  N/A  Cardiac Risk Factors include: advanced age (>47men, >64 women);dyslipidemia;hypertension;male gender    Objective:    Today's Vitals   09/05/19 1419  PainSc: 0-No pain   There is no height or weight on file to calculate BMI. Unable to obtain vitals due to visit being conducted via telephonically.   Advanced Directives 09/05/2019 05/01/2019 08/02/2018 08/02/2018 02/14/2018 08/23/2017 04/21/2016  Does Patient Have a Medical Advance Directive? No No No No No No No  Would patient like information on creating a medical advance directive? No - Patient declined No - Patient declined No - Patient declined - No - Patient declined - -  Some encounter information is confidential and restricted. Go to Review Flowsheets activity to see all data.    Current Medications (verified) Outpatient Encounter Medications as of 09/05/2019  Medication Sig   albuterol (VENTOLIN HFA) 108 (90 Base) MCG/ACT inhaler Inhale into the lungs every 6 (six) hours as needed for wheezing or shortness of breath.   aspirin 81 MG tablet Take 81 mg by mouth daily.   furosemide (LASIX) 40 MG tablet Take 1 tablet (40 mg total) by mouth daily.   gabapentin (NEURONTIN) 300 MG capsule Take 1 capsule (300 mg total) by mouth 2 (two) times daily.   ipratropium-albuterol (DUONEB) 0.5-2.5 (3) MG/3ML SOLN Take 3 mLs by nebulization 4 (four) times daily as needed.   metoprolol succinate (TOPROL-XL) 50 MG 24 hr tablet TAKE 3 TABLETS (150 MG TOTAL)  DAILY. TAKE  WITH OR IMMEDIATELY FOLLOWING A MEAL.   nitrofurantoin, macrocrystal-monohydrate, (MACROBID) 100 MG capsule Take 1 capsule (100 mg total) by mouth at bedtime.   omeprazole (PRILOSEC) 40 MG capsule TAKE 1 CAPSULE DAILY BEFORE BREAKFAST.   OXYGEN Inhale 2 L/min into the lungs continuous.    pravastatin (PRAVACHOL) 20 MG tablet Take 1 tablet (20 mg total) by mouth daily.   tamsulosin (FLOMAX) 0.4 MG CAPS capsule Take 1 capsule (0.4 mg total) by mouth daily.   vitamin B-12 (CYANOCOBALAMIN) 1000 MCG tablet Take 1,000 mcg by mouth once a week. *Take on Monday*   hydrocortisone (ANUSOL-HC) 25 MG suppository Place 1 suppository (25 mg total) rectally 2 (two) times daily as needed for hemorrhoids or anal itching. (Patient not taking: Reported on 09/05/2019)   No facility-administered encounter medications on file as of 09/05/2019.    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 on home O2  COPD (chronic obstructive pulmonary disease) (HCC)    COPD (chronic obstructive pulmonary disease) ([place]) 12/03/2013   May fifth 2015 simple spirometry>> ratio 45%, FEV1 0.95 L (34% per day)    Coronary artery disease    Coronary atherosclerosis 07/08/2015   Dr. Ubaldo Glassing    Esophageal reflux [id number]   Hernia, umbilical    Hypercholesteremia    Hypertension    Obesity    Renal cancer ([place])    a.  s/p nephrectomy in 1980's   Smoking 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 pack years 05/10/2016   Past Surgical History:  Procedure Laterality Date   CHOLECYSTECTOMY N/A 12/22/2014   Procedure: LAPAROSCOPIC CHOLECYSTECTOMY WITH INTRAOPERATIVE CHOLANGIOGRAM;  Surgeon: Christene Lye, MD;  Location: [facility] ORS;  Service: General;  Laterality: N/A;   COLONOSCOPY     ERCP N/A 01/05/2015   Procedure: ENDOSCOPIC RETROGRADE CHOLANGIOPANCREATOGRAPHY (ERCP);  Surgeon: Hulen Luster, MD;  Location[facility] ENDOSCOPY;  Service: Endoscopy;  Laterality: N/A;   ERCP N/A 02/19/2015   Procedure: ENDOSCOPIC RETROGRADE CHOLANGIOPANCREATOGRAPHY (ERCP);  Surgeon: Hulen Luster, MD;  Location: [facility] ENDOSCOPY;  Service: Gastroenterology;  Laterality: N/A;   GALLBLADDER SURGERY  11/2014   NEPHRECTOMY  1990   OMENTECTOMY  12/22/2014   Procedure: OMENTECTOMY;  Surgeon: Christene Lye, MD;  Location: [facility] ORS;  Service: General;;  Partial omentectomy   STENT REMOVAL     UMBILICAL HERNIA REPAIR N/A 12/22/2014   Procedure: HERNIA REPAIR UMBILICAL ADULT;  Surgeon: Christene Lye, MD;  Location: [facility] ORS;  Service: General;  Laterality: N/A;   Family History  Problem Relation Age of Onset   Hypertension Mother    Social History   Socioeconomic History   Marital status: Married    Spouse name: Not on file   Number of children: 1   Years of education: Not on file   Highest education level: 7th grade  Occupational History   Occupation: retired  Tobacco Use   Smoking status: Former Smoker    Packs/day: 1.00    Years: 50.00    Pack years: 50.00    Types: Cigarettes    Quit date: 12/04/2003    Years since quitting: 15.7   Smokeless tobacco: Current User    Types: Chew   Tobacco comment: Has been chewing tobacco since quitting smoking in 2005.  Substance and Sexual Activity   Alcohol use: No   Drug use: No   Sexual activity: Not on file  Other Topics Concern   Not on file  Social History Narrative    Not on file   Social Determinants of Health   Financial Resource Strain: Low Risk    Difficulty of Paying Living Expenses: Not hard at all  Food Insecurity: No Food Insecurity   Worried About Running Out of Food in the Last Year: Never true   Ran Out of Food in the Last Year: Never true  Transportation Needs: No Transportation Needs   Lack of Transportation (Medical): No   Lack of Transportation (Non-Medical): No  Physical Activity: Inactive   Days of Exercise per Week: 0 days   Minutes of Exercise per Session: 0 min  Stress: No Stress Concern Present   Feeling of Stress : Not at all  Social Connections: Slightly Isolated   Frequency of Communication with Friends and Family: More than three times a week   Frequency of Social Gatherings with Friends and Family: Once a week   Attends Religious Services: More than 4 times per year   Active Member of [employer]  or Organizations: No   Attends Archivist Meetings: Never   Marital Status: Married   Tobacco Counseling Ready to quit: Not Answered Counseling given: Not Answered Comment: Has been chewing tobacco since quitting smoking in 2005.   Clinical Intake:  Pre-visit preparation completed: Yes  Pain : No/denies pain Pain Score: 0-No pain     Nutritional Risks: None Diabetes: No  How often do you need to have someone help you when you read instructions, pamphlets, or other written materials from your doctor or pharmacy?: 5 - Always(Wife reads for pt due to vision issues.)  Interpreter Needed?: No  Information entered by :: [facility], LPN  Activities of Daily Living In your present state of health, do you have any difficulty performing the following activities: 09/05/2019 06/20/2019  Hearing? N N  Vision? Y N  Comment Has vision loss in both eyes. -  Difficulty concentrating or making decisions? N N  Walking or climbing stairs? N Y  Dressing or bathing? N N  Doing errands, shopping? Y Y  Comment  Does not drive due to vision issues. Spouse assists.  Preparing Food and eating ? N N  Using the Toilet? N N  In the past six months, have you accidently leaked urine? N N  Do you have problems with loss of bowel control? N N  Managing your Medications? N (No Data)  Comment - Spouse assist  Managing your Finances? Y (No Data)  Comment wife manages finances Spouse Assists  Housekeeping or managing your Housekeeping? N (No Data)  Comment - Spouse Assist  Some recent data might be hidden     Immunizations and Health Maintenance Immunization History  Administered Date(s) Administered   Pneumococcal Conjugate-13 06/02/2014, 05/10/2016   Pneumococcal Polysaccharide-23 06/05/2010, 12/04/2011   There are no preventive care reminders to display for this patient.  Patient Care Team: Virginia Crews, MD as PCP - General (Family Medicine) Neldon Labella, RN as Case Manager Tyler Pita, MD as Consulting Physician (Pulmonary Disease)  Indicate any recent Medical Services you may have received from other than [facility] providers in the past year (date may be approximate).    Assessment:   This is a routine wellness examination for Maddin.  Hearing/Vision screen No exam data present  Dietary issues and exercise activities discussed: Current Exercise Habits: The patient does not participate in regular exercise at present, Exercise limited by: None identified  Goals      Patient Stated    "I'm going to take care of him and I'll let you all know if we need any help" (pt-stated)     Current Barriers:   Knowledge Deficits related to management of COPD and Chronic Hypoxic Respiratory Failure - I spoke with patient's spouse [name] today to offer CCM services and discuss the need for support around management of COPD and Respiratory Failure. Spouse/caregiver indicated "we have the oxygen here and I am taking care of him and I will call you if I need your help. I appreciate you offering  to help me. It's okay for you to call us but we don't need anything in particular right this second."  Chronic Disease Management support and education needs related to COPD/CHRF  Nurse Case Manager Clinical Goal(s):   Over the next 90 days, patient will work with care management team to address needs related to management of COPD/Chronic Respiratory Failure  Interventions:   Evaluation of current treatment plan related to COPD and Chronic Respiratory Failure and patient's adherence to plan  as established by provider.  Provided education to patient re: offer of palliative/hospice services and other community and in home care resources to support patient/caregiver with management of advanced pulmonary disease  Discussed plans with patient for ongoing care management follow up and provided patient with direct contact information for care management team; patient/spouse agreed to ongoing telephone outreach and support  Patient Self Care Activities:   Calls pharmacy for medication refills  Does not perform ADL's independently - requires assistance of spouse  Initial goal documentation       Other    Advanced Care Planning     Current Barriers:   No Advanced Directives in place  Nurse Case Manager Clinical Goal(s):   Over the next 30 days, the patient will work with the care management team towards completion of advanced directives; patient/spouse decline AD conversation at this time but willing to receive telephone outreach in the future to address  Interventions:   Provided education to patient re: Advanced Care Planning and rationale for completion  Patient Self Care Activities:   Calls provider office for new concerns or questions  Does not adhere to provider recommendations regarding support through hospice/palliative care  Initial goal documentation       Depression Screen PHQ 2/9 Scores 09/05/2019 06/20/2019 12/03/2018  PHQ - 2 Score 0 0 0    Fall Risk Fall Risk   09/05/2019 06/20/2019  Falls in the past year? 0 0  Number falls in past yr: 0 -  Injury with Fall? 0 -  Risk for fall due to : - Impaired balance/gait;Medication side effect  Follow up - Falls prevention discussed    FALL RISK PREVENTION PERTAINING TO THE HOME:  Any stairs in or around the home? Yes  If so, are there any without handrails? No   Home free of loose throw rugs in walkways, pet beds, electrical cords, etc? Yes  Adequate lighting in your home to reduce risk of falls? Yes   ASSISTIVE DEVICES UTILIZED TO PREVENT FALLS:  Life alert? No  Use of a cane, walker or w/c? No  Grab bars in the bathroom? Yes  Shower chair or bench in shower? Yes  Elevated toilet seat or a handicapped toilet? Yes    TIMED UP AND GO:  Was the test performed? No .    Cognitive Function: Declined today.         Screening Tests Health Maintenance  Topic Date Due   INFLUENZA VACCINE  10/30/2019 (Originally 03/02/2019)   TETANUS/TDAP  09/04/2020 (Originally 09/28/1959)   PNA vac Low Risk Adult  Completed    Qualifies for Shingles Vaccine? Yes . Due for Shingrix. Pt has been advised to call insurance company to determine out of pocket expense. Advised may also receive vaccine at local pharmacy or Health Dept. Verbalized acceptance and understanding.  Tdap: Although this vaccine is not a covered service during a Wellness Exam, does the patient still wish to receive this vaccine today?  No . Advised may receive this vaccine at local pharmacy or Health Dept. Aware to provide a copy of the vaccination record if obtained from local pharmacy or Health Dept. Verbalized acceptance and understanding.  Flu Vaccine: Due for Flu vaccine. Does the patient want to receive this vaccine today?  No . Advised may receive this vaccine at local pharmacy or Health Dept. Aware to provide a copy of the vaccination record if obtained from local pharmacy or Health Dept. Verbalized acceptance and  understanding.  Pneumococcal Vaccine: Completed series  Cancer Screenings:  Colorectal Screening: No longer required.   Lung Cancer Screening: (Low Dose CT Chest recommended if Age 60-80 years, 30 pack-year currently smoking OR have quit w/in 15years.) does not qualify.   Additional Screening:  Vision Screening: Recommended annual ophthalmology exams for early detection of glaucoma and other disorders of the eye.  Dental Screening: Recommended annual dental exams for proper oral hygiene  Community Resource Referral:  CRR required this visit?  No        Plan:  I have personally reviewed and addressed the Medicare Annual Wellness questionnaire and have noted the following in the patients chart:  A. Medical and social history B. Use of alcohol, tobacco or illicit drugs  C. Current medications and supplements D. Functional ability and status E.  Nutritional status F.  Physical activity G. Advance directives H. List of other physicians I.  Hospitalizations, surgeries, and ER visits in previous 12 months J.  [place] such as hearing and vision if needed, cognitive and depression L. Referrals and appointments   In addition, I have reviewed and discussed with patient certain preventive protocols, quality metrics, and best practice recommendations. A written personalized care plan for preventive services as well as general preventive health recommendations were provided to patient.   [place] Score, [place]   [id number]  Nurse Health Advisor   Nurse Notes: Pt declined receiving the flu shot.

## 2019-09-05 ENCOUNTER — Ambulatory Visit (INDEPENDENT_AMBULATORY_CARE_PROVIDER_SITE_OTHER): Payer: Medicare HMO

## 2019-09-05 ENCOUNTER — Other Ambulatory Visit: Payer: Self-pay

## 2019-09-05 DIAGNOSIS — Z0000 Encounter for general adult medical examination without abnormal findings: Secondary | ICD-10-CM

## 2019-09-05 NOTE — Patient Instructions (Signed)
Bobby Peterson , Thank you for taking time to come for your Medicare Wellness Visit. I appreciate your ongoing commitment to your health goals. Please review the following plan we discussed and let me know if I can assist you in the future.   Screening recommendations/referrals: Colonoscopy: No longer required.  Recommended yearly ophthalmology/optometry visit for glaucoma screening and checkup Recommended yearly dental visit for hygiene and checkup  Vaccinations: Influenza vaccine: Pt declines today.  Pneumococcal vaccine: Completed series Tdap vaccine: Pt declines today.  Shingles vaccine: Pt declines today.     Advanced directives: Advance directive discussed with you today. Even though you declined this today please call our office should you change your mind and we can give you the proper paperwork for you to fill out.  Conditions/risks identified: Continue to drink 6-8 8 oz glasses of water a day.   Next appointment: 09/06/19 @ 10:00 AM with Dr Brita Romp. Declined scheduling an AWV for 2022 at this time.   Preventive Care 81 Years and Older, Male Preventive care refers to lifestyle choices and visits with your health care provider that can promote health and wellness. What does preventive care include?  A yearly physical exam. This is also called an annual well check.  Dental exams once or twice a year.  Routine eye exams. Ask your health care provider how often you should have your eyes checked.  Personal lifestyle choices, including:  Daily care of your teeth and gums.  Regular physical activity.  Eating a healthy diet.  Avoiding tobacco and drug use.  Limiting alcohol use.  Practicing safe sex.  Taking low doses of aspirin every day.  Taking vitamin and mineral supplements as recommended by your health care provider. What happens during an annual well check? The services and screenings done by your health care provider during your annual well check will depend on  your age, overall health, lifestyle risk factors, and family history of disease. Counseling  Your health care provider may ask you questions about your:  Alcohol use.  Tobacco use.  Drug use.  Emotional well-being.  Home and relationship well-being.  Sexual activity.  Eating habits.  History of falls.  Memory and ability to understand (cognition).  Work and work Statistician. Screening  You may have the following tests or measurements:  Height, weight, and BMI.  Blood pressure.  Lipid and cholesterol levels. These may be checked every 5 years, or more frequently if you are over 74 years old.  Skin check.  Lung cancer screening. You may have this screening every year starting at age 31 if you have a 30-pack-year history of smoking and currently smoke or have quit within the past 15 years.  Fecal occult blood test (FOBT) of the stool. You may have this test every year starting at age 21.  Flexible sigmoidoscopy or colonoscopy. You may have a sigmoidoscopy every 5 years or a colonoscopy every 10 years starting at age 40.  Prostate cancer screening. Recommendations will vary depending on your family history and other risks.  Hepatitis C blood test.  Hepatitis B blood test.  Sexually transmitted disease (STD) testing.  Diabetes screening. This is done by checking your blood sugar (glucose) after you have not eaten for a while (fasting). You may have this done every 1-3 years.  Abdominal aortic aneurysm (AAA) screening. You may need this if you are a current or former smoker.  Osteoporosis. You may be screened starting at age 81 if you are at high risk. Talk with your  health care provider about your test results, treatment options, and if necessary, the need for more tests. Vaccines  Your health care provider may recommend certain vaccines, such as:  Influenza vaccine. This is recommended every year.  Tetanus, diphtheria, and acellular pertussis (Tdap, Td) vaccine.  You may need a Td booster every 10 years.  Zoster vaccine. You may need this after age 81.  Pneumococcal 13-valent conjugate (PCV13) vaccine. One dose is recommended after age 81.  Pneumococcal polysaccharide (PPSV23) vaccine. One dose is recommended after age 81. Talk to your health care provider about which screenings and vaccines you need and how often you need them. This information is not intended to replace advice given to you by your health care provider. Make sure you discuss any questions you have with your health care provider. Document Released: 08/14/2015 Document Revised: 04/06/2016 Document Reviewed: 05/19/2015 Elsevier Interactive Patient Education  2017 [place] Prevention in the Home Falls can cause injuries. They can happen to people of all ages. There are many things you can do to make your home safe and to help prevent falls. What can I do on the outside of my home?  Regularly fix the edges of walkways and driveways and fix any cracks.  Remove anything that might make you trip as you walk through a door, such as a raised step or threshold.  Trim any bushes or trees on the path to your home.  Use bright outdoor lighting.  Clear any walking paths of anything that might make someone trip, such as rocks or tools.  Regularly check to see if handrails are loose or broken. Make sure that both sides of any steps have handrails.  Any raised decks and porches should have guardrails on the edges.  Have any leaves, snow, or ice cleared regularly.  Use sand or salt on walking paths during winter.  Clean up any spills in your garage right away. This includes oil or grease spills. What can I do in the bathroom?  Use night lights.  Install grab bars by the toilet and in the tub and shower. Do not use towel bars as grab bars.  Use non-skid mats or decals in the tub or shower.  If you need to sit down in the shower, use a plastic, non-slip stool.  Keep the  floor dry. Clean up any water that spills on the floor as soon as it happens.  Remove soap buildup in the tub or shower regularly.  Attach bath mats securely with double-sided non-slip rug tape.  Do not have throw rugs and other things on the floor that can make you trip. What can I do in the bedroom?  Use night lights.  Make sure that you have a light by your bed that is easy to reach.  Do not use any sheets or blankets that are too big for your bed. They should not hang down onto the floor.  Have a firm chair that has side arms. You can use this for support while you get dressed.  Do not have throw rugs and other things on the floor that can make you trip. What can I do in the kitchen?  Clean up any spills right away.  Avoid walking on wet floors.  Keep items that you use a lot in easy-to-reach places.  If you need to reach something above you, use a strong step stool that has a grab bar.  Keep electrical cords out of the way.  Do not use  floor polish or wax that makes floors slippery. If you must use wax, use non-skid floor wax.  Do not have throw rugs and other things on the floor that can make you trip. What can I do with my stairs?  Do not leave any items on the stairs.  Make sure that there are handrails on both sides of the stairs and use them. Fix handrails that are broken or loose. Make sure that handrails are as long as the stairways.  Check any carpeting to make sure that it is firmly attached to the stairs. Fix any carpet that is loose or worn.  Avoid having throw rugs at the top or bottom of the stairs. If you do have throw rugs, attach them to the floor with carpet tape.  Make sure that you have a light switch at the top of the stairs and the bottom of the stairs. If you do not have them, ask someone to add them for you. What else can I do to help prevent falls?  Wear shoes that:  Do not have high heels.  Have rubber bottoms.  Are comfortable and fit  you well.  Are closed at the toe. Do not wear sandals.  If you use a stepladder:  Make sure that it is fully opened. Do not climb a closed stepladder.  Make sure that both sides of the stepladder are locked into place.  Ask someone to hold it for you, if possible.  Clearly mark and make sure that you can see:  Any grab bars or handrails.  First and last steps.  Where the edge of each step is.  Use tools that help you move around (mobility aids) if they are needed. These include:  Canes.  Walkers.  Scooters.  Crutches.  Turn on the lights when you go into a dark area. Replace any light bulbs as soon as they burn out.  Set up your furniture so you have a clear path. Avoid moving your furniture around.  If any of your floors are uneven, fix them.  If there are any pets around you, be aware of where they are.  Review your medicines with your doctor. Some medicines can make you feel dizzy. This can increase your chance of falling. Ask your doctor what other things that you can do to help prevent falls. This information is not intended to replace advice given to you by your health care provider. Make sure you discuss any questions you have with your health care provider. Document Released: 05/14/2009 Document Revised: 12/24/2015 Document Reviewed: 08/22/2014 Elsevier Interactive Patient Education  2017 [employer].

## 2019-09-06 ENCOUNTER — Ambulatory Visit (INDEPENDENT_AMBULATORY_CARE_PROVIDER_SITE_OTHER): Payer: Medicare HMO | Admitting: Family Medicine

## 2019-09-06 ENCOUNTER — Other Ambulatory Visit: Payer: Self-pay

## 2019-09-06 ENCOUNTER — Ambulatory Visit: Payer: Medicare HMO

## 2019-09-06 ENCOUNTER — Encounter: Payer: Self-pay | Admitting: Family Medicine

## 2019-09-06 VITALS — BP 124/58 | HR 90 | Temp 96.2°F | Ht 65.0 in | Wt 184.0 lb

## 2019-09-06 DIAGNOSIS — I10 Essential (primary) hypertension: Secondary | ICD-10-CM

## 2019-09-06 DIAGNOSIS — J9611 Chronic respiratory failure with hypoxia: Secondary | ICD-10-CM | POA: Diagnosis not present

## 2019-09-06 DIAGNOSIS — I5032 Chronic diastolic (congestive) heart failure: Secondary | ICD-10-CM

## 2019-09-06 DIAGNOSIS — J449 Chronic obstructive pulmonary disease, unspecified: Secondary | ICD-10-CM | POA: Diagnosis not present

## 2019-09-06 DIAGNOSIS — N1832 Chronic kidney disease, stage 3b: Secondary | ICD-10-CM | POA: Diagnosis not present

## 2019-09-06 DIAGNOSIS — Z0000 Encounter for general adult medical examination without abnormal findings: Secondary | ICD-10-CM

## 2019-09-06 DIAGNOSIS — N321 Vesicointestinal fistula: Secondary | ICD-10-CM

## 2019-09-06 DIAGNOSIS — I2510 Atherosclerotic heart disease of native coronary artery without angina pectoris: Secondary | ICD-10-CM

## 2019-09-06 DIAGNOSIS — R739 Hyperglycemia, unspecified: Secondary | ICD-10-CM

## 2019-09-06 NOTE — Assessment & Plan Note (Signed)
Chronic Saw urology and general surgery regarding this, but is not a good surgical candidate Unlikely to heal spontaneously Continue daily Macrobid for UTI prophylaxis No urinary symptoms at this time, so no need to check UA today

## 2019-09-06 NOTE — Assessment & Plan Note (Signed)
Chronic and stable Gold grade D O2 dependent at 2 L/min Continue nebulizers Has previously seen pulmonology and done pulmonary rehab, but not for several years

## 2019-09-06 NOTE — Assessment & Plan Note (Signed)
Recheck hemoglobin A1c

## 2019-09-06 NOTE — Assessment & Plan Note (Signed)
Chronic Avoid NSAIDs and other nephrotoxins Renally adjust medications Recheck metabolic panel

## 2019-09-06 NOTE — Progress Notes (Signed)
.       Patient: Bobby Peterson, Male    DOB: [birth year], 79 y.o.   MRN: [id number] Visit Date: 09/06/2019  Today's Provider: Lavon Paganini, MD   Chief Complaint  Patient presents with  . Annual Exam   Subjective:     Complete Physical Bobby Peterson is a 79 y.o. male. He feels well. He reports not exercising. He reports he is sleeping fairly well.  -----------------------------------------------------------   Review of Systems  Constitutional: Negative.   HENT: Negative.   Eyes: Negative.   Respiratory: Negative.   Cardiovascular: Negative.   Gastrointestinal: Negative.   Endocrine: Negative.   Genitourinary: Negative.   Musculoskeletal: Negative.   Skin: Negative.   Allergic/Immunologic: Negative.   Neurological: Negative.   Hematological: Negative.   Psychiatric/Behavioral: Negative.     Social History   Socioeconomic History  . Marital status: Married    Spouse name: Not on file  . Number of children: 1  . Years of education: Not on file  . Highest education level: 7th grade  Occupational History  . Occupation: retired  Tobacco Use  . Smoking status: Former Smoker    Packs/day: 1.00    Years: 50.00    Pack years: 50.00    Types: Cigarettes    Quit date: 12/04/2003    Years since quitting: 15.7  . Smokeless tobacco: Current User    Types: Chew  . Tobacco comment: Has been chewing tobacco since quitting smoking in 2005.  Substance and Sexual Activity  . Alcohol use: No  . Drug use: No  . Sexual activity: Not on file  Other Topics Concern  . Not on file  Social History Narrative  . Not on file   Social Determinants of Health   Financial Resource Strain: Low Risk   . Difficulty of Paying Living Expenses: Not hard at all  Food Insecurity: No Food Insecurity  . Worried About Charity fundraiser in the Last Year: Never true  . Ran Out of Food in the Last Year: Never true  Transportation Needs: No Transportation Needs  . Lack of  Transportation (Medical): No  . Lack of Transportation (Non-Medical): No  Physical Activity: Inactive  . Days of Exercise per Week: 0 days  . Minutes of Exercise per Session: 0 min  Stress: No Stress Concern Present  . Feeling of Stress : Not at all  Social Connections: Slightly Isolated  . Frequency of Communication with Friends and Family: More than three times a week  . Frequency of Social Gatherings with Friends and Family: Once a week  . Attends Religious Services: More than 4 times per year  . Active Member of Clubs or Organizations: No  . Attends Archivist Meetings: Never  . Marital Status: Married  Human resources officer Violence: Not At Risk  . Fear of Current or Ex-Partner: No  . Emotionally Abused: No  . Physically Abused: No  . Sexually Abused: No    Past Medical History:  Diagnosis Date  . Acute respiratory failure with hypoxia ([place]) 12/03/2013   Overview:  Overview:  2 L O2 continuously  Last Assessment & Plan:  Continue 2 L O2 continuously   . Atrial tachycardia ([place]) 04/21/2016  . 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 BP (high blood pressure) 07/08/2015  . Chronic diastolic CHF (congestive heart failure) ([place])   . Chronic diastolic heart failure ([place]) 07/08/2015  . Chronic hypoxemic respiratory failure ([place]) 12/03/2013   2 L O2 continuously   .  Chronic respiratory failure (HCC)    a. on home O2  . COPD (chronic obstructive pulmonary disease) ([place])   . COPD (chronic obstructive pulmonary disease) ([place]) 12/03/2013   May fifth 2015 simple spirometry>> ratio 45%, FEV1 0.95 L (34% per day)   . Coronary artery disease   . Coronary atherosclerosis 07/08/2015   Dr. Ubaldo Glassing   . Esophageal reflux 07/08/2015  . Hernia, umbilical   . Hypercholesteremia   . Hypertension   . Obesity   . Renal cancer ([place])    a. s/p nephrectomy in 1980's  . Smoking 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 pack years 05/10/2016     Patient Active Problem List   Diagnosis Date Noted  . 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 Smoking 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 pack years 05/10/2016  . Atrial tachycardia ([place]) 04/21/2016  . Allergic rhinitis 07/08/2015  . 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 Coronary atherosclerosis 07/08/2015  . Chronic diastolic heart failure ([place]) 07/08/2015  . Chronic kidney disease (CKD), stage III (moderate) 07/08/2015  . Elevated blood sugar 07/08/2015  . Esophageal reflux 07/08/2015  . HLD (hyperlipidemia) 07/08/2015  . Essential hypertension 07/08/2015  . Calculus of kidney 07/08/2015  . COPD (chronic obstructive pulmonary disease) ([place]) 12/03/2013  . Chronic hypoxemic respiratory failure ([place]) 12/03/2013    Past Surgical History:  Procedure Laterality Date  . CHOLECYSTECTOMY N/A 12/22/2014   Procedure: LAPAROSCOPIC CHOLECYSTECTOMY WITH INTRAOPERATIVE CHOLANGIOGRAM;  Surgeon: Christene Lye, MD;  Location: [facility] ORS;  Service: General;  Laterality: N/A;  . COLONOSCOPY    . ERCP N/A 01/05/2015   Procedure: ENDOSCOPIC RETROGRADE CHOLANGIOPANCREATOGRAPHY (ERCP);  Surgeon: Hulen Luster, MD;  Location: [facility] ENDOSCOPY;  Service: Endoscopy;  Laterality: N/A;  . ERCP N/A 02/19/2015   Procedure: ENDOSCOPIC RETROGRADE CHOLANGIOPANCREATOGRAPHY (ERCP);  Surgeon: Hulen Luster, MD;  Location: [facility] ENDOSCOPY;  Service: Gastroenterology;  Laterality: N/A;  . GALLBLADDER SURGERY  11/2014  . NEPHRECTOMY  1990  . OMENTECTOMY  12/22/2014   Procedure: OMENTECTOMY;  Surgeon: Christene Lye, MD;  Location: [facility] ORS;  Service: General;;  Partial omentectomy  . STENT REMOVAL    . UMBILICAL HERNIA REPAIR N/A 12/22/2014   Procedure: HERNIA REPAIR UMBILICAL ADULT;  Surgeon: Christene Lye, MD;  Location: [facility] ORS;  Service: General;  Laterality: N/A;    His family history includes Hypertension in his mother.   Current Outpatient Medications:  .  albuterol (VENTOLIN HFA) 108 (90 Base) MCG/ACT inhaler, Inhale into the lungs  every 6 (six) hours as needed for wheezing or shortness of breath., Disp: , Rfl:  .  aspirin 81 MG tablet, Take 81 mg by mouth daily., Disp: , Rfl:  .  furosemide (LASIX) 40 MG tablet, Take 1 tablet (40 mg total) by mouth daily., Disp: 90 tablet, Rfl: 3 .  gabapentin (NEURONTIN) 300 MG capsule, Take 1 capsule (300 mg total) by mouth 2 (two) times daily., Disp: 180 capsule, Rfl: 3 .  ipratropium-albuterol (DUONEB) 0.5-2.5 (3) MG/3ML SOLN, Take 3 mLs by nebulization 4 (four) times daily as needed., Disp: 360 mL, Rfl: 11 .  metoprolol succinate (TOPROL-XL) 50 MG 24 hr tablet, TAKE 3 TABLETS (150 MG TOTAL)  DAILY. TAKE WITH OR IMMEDIATELY FOLLOWING A MEAL., Disp: 270 tablet, Rfl: 2 .  nitrofurantoin, macrocrystal-monohydrate, (MACROBID) 100 MG capsule, Take 1 capsule (100 mg total) by mouth at bedtime., Disp: 90 capsule, Rfl: 1 .  omeprazole (PRILOSEC) 40 MG capsule, TAKE 1 CAPSULE DAILY BEFORE BREAKFAST., Disp:  90 capsule, Rfl: 3 .  OXYGEN, Inhale 2 L/min into the lungs continuous. , Disp: , Rfl:  .  pravastatin (PRAVACHOL) 20 MG tablet, Take 1 tablet (20 mg total) by mouth daily., Disp: 90 tablet, Rfl: 3 .  tamsulosin (FLOMAX) 0.4 MG CAPS capsule, Take 1 capsule (0.4 mg total) by mouth daily., Disp: 90 capsule, Rfl: 3 .  vitamin B-12 (CYANOCOBALAMIN) 1000 MCG tablet, Take 1,000 mcg by mouth once a week. *Take on Monday*, Disp: , Rfl:   Patient Care Team: Virginia Crews, MD as PCP - General (Family Medicine) Neldon Labella, RN as Case Manager Tyler Pita, MD as Consulting Physician (Pulmonary Disease)     Objective:    Vitals: BP (!) 124/58 (BP Location: Left Arm, Patient Position: Sitting, Cuff Size: Large)   Pulse 90   Temp (!) 96.2 F (35.7 C) (Temporal)   Ht 5\' 5"  (1.651 m)   Wt 184 lb (83.5 kg)   SpO2 96% Comment: With 2l of O2  BMI 30.62 kg/m   Physical Exam Vitals reviewed.  Constitutional:      General: He is not in acute distress.    Appearance: Normal  appearance. He is well-developed. He is not diaphoretic.  HENT:     Head: Normocephalic and atraumatic.     Right Ear: External ear normal.     Left Ear: External ear normal.  Eyes:     General: No scleral icterus.    Conjunctiva/sclera: Conjunctivae normal.     Pupils: Pupils are equal, round, and reactive to light.  Neck:     Thyroid: No thyromegaly.  Cardiovascular:     Rate and Rhythm: Normal rate and regular rhythm.     Heart sounds: Normal heart sounds.  Pulmonary:     Effort: Pulmonary effort is normal. No respiratory distress.     Breath sounds: Normal breath sounds. No wheezing or rales.     Comments: O2 in place Abdominal:     General: There is no distension.     Palpations: Abdomen is soft.     Tenderness: There is no abdominal tenderness. There is no guarding or rebound.  Musculoskeletal:        General: No deformity.     Cervical back: Neck supple.     Right lower leg: No edema.     Left lower leg: No edema.  Lymphadenopathy:     Cervical: No cervical adenopathy.  Skin:    General: Skin is warm and dry.     Capillary Refill: Capillary refill takes less than 2 seconds.     Findings: No rash.  Neurological:     Mental Status: He is alert and oriented to person, place, and time. Mental status is at baseline.  Psychiatric:        Mood and Affect: Mood normal.        Behavior: Behavior normal.        Thought Content: Thought content normal.     Activities of Daily Living In your present state of health, do you have any difficulty performing the following activities: 09/05/2019 06/20/2019  Hearing? N N  Vision? Y N  Comment Has vision loss in both eyes. -  Difficulty concentrating or making decisions? N N  Walking or climbing stairs? N Y  Dressing or bathing? N N  Doing errands, shopping? Y Y  Comment Does not drive due to vision issues. Spouse assists.  Preparing Food and eating ? N N  Using the Toilet? N N  In the past six months, have you accidently  leaked urine? N N  Do you have problems with loss of bowel control? N N  Managing your Medications? N (No Data)  Comment - Spouse assist  Managing your Finances? Y (No Data)  Comment wife manages finances Spouse Assists  Housekeeping or managing your Housekeeping? N (No Data)  Comment - Spouse Assist  Some recent data might be hidden    Fall Risk Assessment Fall Risk  09/05/2019 06/20/2019  Falls in the past year? 0 0  Number falls in past yr: 0 -  Injury with Fall? 0 -  Risk for fall due to : - Impaired balance/gait;Medication side effect  Follow up - Falls prevention discussed     Depression Screen PHQ 2/9 Scores 09/05/2019 06/20/2019 12/03/2018  PHQ - 2 Score 0 0 0    No flowsheet data found.     Assessment & Plan:    Annual Physical Reviewed patient's Family Medical History Reviewed and updated list of patient's medical providers Assessment of cognitive impairment was done Assessed patient's functional ability Established a written schedule for health screening [place] Completed and Reviewed  Exercise Activities and Dietary recommendations Goals    . "I'm going to take care of him and I'll let you all know if we need any help" (pt-stated)     Current Barriers:  [name] Knowledge Deficits related to management of COPD and Chronic Hypoxic Respiratory Failure - I spoke with patient's spouse [name] today to offer CCM services and discuss the need for support around management of COPD and Respiratory Failure. Spouse/caregiver indicated "we have the oxygen here and I am taking care of him and I will call you if I need your help. I appreciate you offering to help me. It's okay for you to call us but we don't need anything in particular right this second." . Chronic Disease Management support and education needs related to COPD/CHRF  Nurse Case Manager Clinical Goal(s):  [name] Over the next 90 days, patient will work with care management team to address needs related  to management of COPD/Chronic Respiratory Failure  Interventions:  . Evaluation of current treatment plan related to COPD and Chronic Respiratory Failure and patient's adherence to plan as established by provider. . Provided education to patient re: offer of palliative/hospice services and other community and in home care resources to support patient/caregiver with management of advanced pulmonary disease . Discussed plans with patient for ongoing care management follow up and provided patient with direct contact information for care management team; patient/spouse agreed to ongoing telephone outreach and support  Patient Self Care Activities:  . Calls pharmacy for medication refills . Does not perform ADL's independently - requires assistance of spouse  Initial goal documentation     . Advanced Care Planning     Current Barriers:  . No Advanced Directives in place  Nurse Case Manager Clinical Goal(s):  [name] Over the next 30 days, the patient will work with the care management team towards completion of advanced directives; patient/spouse decline AD conversation at this time but willing to receive telephone outreach in the future to address  Interventions:  . Provided education to patient re: Advanced Care Planning and rationale for completion  Patient Self Care Activities:  . Calls provider office for new concerns or questions . Does not adhere to provider recommendations regarding support through hospice/palliative care  Initial goal documentation        Immunization History  Administered Date(s) Administered  .  Pneumococcal Conjugate-13 06/02/2014, 05/10/2016  . Pneumococcal Polysaccharide-23 06/05/2010, 12/04/2011    Health Maintenance  Topic Date Due  . INFLUENZA VACCINE  10/30/2019 (Originally 03/02/2019)  . TETANUS/TDAP  09/04/2020 (Originally 09/28/1959)  . PNA vac Low Risk Adult  Completed     Discussed health benefits of physical activity, and encouraged him to  engage in regular exercise appropriate for his age and condition.    Discussed importance of COVID-19 vaccination and encouraged him to get this when he is able.  We discussed that his chronic medical conditions are not contraindications to Covid vaccination, but rather encourage any further that he should get vaccination ------------------------------------------------------------------------------------------------------------  Problem List Items Addressed This Visit      Cardiovascular and Mediastinum   Coronary atherosclerosis    Chronic and stable without angina currently Continue aspirin and beta-blocker Followed by cardiology as well      Chronic diastolic heart failure (HCC)    Chronic and stable Euvolemic on exam Asymptomatic Continue Lasix at current dose Recheck metabolic panel      Essential hypertension    Chronic and well controlled Continue metoprolol and Lasix at current doses Recheck metabolic panel Follow-up in 6 months        Respiratory   COPD (chronic obstructive pulmonary disease) ([place])    Chronic and stable Gold grade D O2 dependent at 2 L/min Continue nebulizers Has previously seen pulmonology and done pulmonary rehab, but not for several years      Chronic hypoxemic respiratory failure ([place])    Secondary to COPD Continuous O2 at 2 L/min Respiratory status is stable        Digestive   Colovesical fistula    Chronic Saw urology and general surgery regarding this, but is not a good surgical candidate Unlikely to heal spontaneously Continue daily Macrobid for UTI prophylaxis No urinary symptoms at this time, so no need to check UA today        Genitourinary   Chronic kidney disease (CKD), stage III (moderate)    Chronic Avoid NSAIDs and other nephrotoxins Renally adjust medications Recheck metabolic panel      Relevant Orders   Comprehensive metabolic panel     Other   Elevated blood sugar    Recheck hemoglobin A1c       Relevant Orders   Hemoglobin A1c    Other Visit Diagnoses    Encounter for annual physical exam    -  Primary   Relevant Orders   Comprehensive metabolic panel   Hemoglobin A1c       Return in about 6 months (around 03/05/2020) for chronic disease f/u.   The entirety of the information documented in the History of Present Illness, Review of Systems and Physical Exam were personally obtained by me. Portions of this information were initially documented by Ashley Royalty, CMA and reviewed by me for thoroughness and accuracy.    Kord Monette, Dionne Bucy, MD MPH [facility]

## 2019-09-06 NOTE — Patient Instructions (Addendum)
[place] is currently in phase 1B group one of our vaccination plan for COVID-19.  You can check with the [place] to see the breakdown of the priority groups and where you fall in this.  At this time, [facility] does not have any COVID-19 vaccines.  We do not know if or when or how we may have COVID-19 vaccines in the future.  If you are in one of the priority groups that is eligible for vaccination at this time, I recommend that you check with your local health department for vaccine availability.  [place] is also partnering with the [facility] health department to provide vaccinations at the [facility].  You are eligible for these even if you are not a [facility] resident.  Please be patient when trying to schedule these appointments, as there are a lot of peple trying to get appointments at this time  The website to get more information about [place]'s vaccination efforts or to sign up for vaccine appointment or to get on the waitlist is:  ShippingScam.co.uk  You should still get the vaccine if you: have antibiotic allergies, have an autoimmune condition (check with your rheumatologist about how to time your meds), or have cancer.  Everyone should expect to be monitored for 15 minutes after their vaccine; people with a history of additional significant allergy may be monitored for 30 minutes.  Currently the only contraindication is allergy to one of the vaccine components themselves.  People under the age of 15 are not yet approved for vaccination but trials are ongoing.  If you have questions about the different types of vaccinations, including how they work and what is in them and how they have been studied, this information is available through the [facility].  https://www.huang.com/.html  You do still need  to wear a mask and avoid large crowds after being vaccinated.   Preventive Care 81 Years and Older, Male Preventive care refers to lifestyle choices and visits with your health care provider that can promote health and wellness. This includes:  A yearly physical exam. This is also called an annual well check.  Regular dental and eye exams.  Immunizations.  Screening for certain conditions.  Healthy lifestyle choices, such as diet and exercise. What can I expect for my preventive care visit? Physical exam Your health care provider will check:  Height and weight. These may be used to calculate body mass index (BMI), which is a measurement that tells if you are at a healthy weight.  Heart rate and blood pressure.  Your skin for abnormal spots. Counseling Your health care provider may ask you questions about:  Alcohol, tobacco, and drug use.  Emotional well-being.  Home and relationship well-being.  Sexual activity.  Eating habits.  History of falls.  Memory and ability to understand (cognition).  Work and work Statistician. What immunizations do I need?  Influenza (flu) vaccine  This is recommended every year. Tetanus, diphtheria, and pertussis (Tdap) vaccine  You may need a Td booster every 10 years. Varicella (chickenpox) vaccine  You may need this vaccine if you have not already been vaccinated. Zoster (shingles) vaccine  You may need this after age 29. Pneumococcal conjugate (PCV13) vaccine  One dose is recommended after age 22. Pneumococcal polysaccharide (PPSV23) vaccine  One dose is recommended after age 12. Measles, mumps, and rubella (MMR) vaccine  You may need at least one dose of MMR if you were born in  1959 or later. You may also need a second dose. Meningococcal conjugate (MenACWY) vaccine  You may need this if you have certain conditions. Hepatitis A vaccine  You may need this if you have certain conditions or if you travel or work in  places where you may be exposed to hepatitis A. Hepatitis B vaccine  You may need this if you have certain conditions or if you travel or work in places where you may be exposed to hepatitis B. Haemophilus influenzae type b (Hib) vaccine  You may need this if you have certain conditions. You may receive vaccines as individual doses or as more than one vaccine together in one shot (combination vaccines). Talk with your health care provider about the risks and benefits of combination vaccines. What tests do I need? Blood tests  Lipid and cholesterol levels. These may be checked every 5 years, or more frequently depending on your overall health.  Hepatitis C test.  Hepatitis B test. Screening  Lung cancer screening. You may have this screening every year starting at age 61 if you have a 30-pack-year history of smoking and currently smoke or have quit within the past 15 years.  Colorectal cancer screening. All adults should have this screening starting at age 56 and continuing until age 42. Your health care provider may recommend screening at age 18 if you are at increased risk. You will have tests every 1-10 years, depending on your results and the type of screening test.  Prostate cancer screening. Recommendations will vary depending on your family history and other risks.  Diabetes screening. This is done by checking your blood sugar (glucose) after you have not eaten for a while (fasting). You may have this done every 1-3 years.  Abdominal aortic aneurysm (AAA) screening. You may need this if you are a current or former smoker.  Sexually transmitted disease (STD) testing. Follow these instructions at home: Eating and drinking  Eat a diet that includes fresh fruits and vegetables, whole grains, lean protein, and low-fat dairy products. Limit your intake of foods with high amounts of sugar, saturated fats, and salt.  Take vitamin and mineral supplements as recommended by your health  care provider.  Do not drink alcohol if your health care provider tells you not to drink.  If you drink alcohol: ? Limit how much you have to 0-2 drinks a day. ? Be aware of how much alcohol is in your drink. In the [place] one drink equals one 12 oz bottle of beer (355 mL), one 5 oz glass of wine (148 mL), or one 1 oz glass of hard liquor (44 mL). Lifestyle  Take daily care of your teeth and gums.  Stay active. Exercise for at least 30 minutes on 5 or more days each week.  Do not use any products that contain nicotine or tobacco, such as cigarettes, e-cigarettes, and chewing tobacco. If you need help quitting, ask your health care provider.  If you are sexually active, practice safe sex. Use a condom or other form of protection to prevent STIs (sexually transmitted infections).  Talk with your health care provider about taking a low-dose aspirin or statin. What's next?  Visit your health care provider once a year for a well check visit.  Ask your health care provider how often you should have your eyes and teeth checked.  Stay up to date on all vaccines. This information is not intended to replace advice given to you by your health care provider. Make sure you  discuss any questions you have with your health care provider. Document Revised: 07/12/2018 Document Reviewed: 07/12/2018 Elsevier Patient Education  2020 [employer].

## 2019-09-06 NOTE — Assessment & Plan Note (Signed)
Chronic and stable without angina currently Continue aspirin and beta-blocker Followed by cardiology as well

## 2019-09-06 NOTE — Assessment & Plan Note (Signed)
Chronic and stable Euvolemic on exam Asymptomatic Continue Lasix at current dose Recheck metabolic panel

## 2019-09-06 NOTE — Assessment & Plan Note (Signed)
Secondary to COPD Continuous O2 at 2 L/min Respiratory status is stable

## 2019-09-06 NOTE — Assessment & Plan Note (Signed)
Chronic and well controlled Continue metoprolol and Lasix at current doses Recheck metabolic panel Follow-up in 6 months

## 2019-09-07 LAB — COMPREHENSIVE METABOLIC PANEL
ALT: 10 IU/L (ref 0–44)
AST: 13 IU/L (ref 0–40)
Albumin/Globulin Ratio: 1.4 (ref 1.2–2.2)
Albumin: 3.8 g/dL (ref 3.7–4.7)
Alkaline Phosphatase: 105 IU/L (ref 39–117)
BUN/Creatinine Ratio: 8 — ABNORMAL LOW (ref 10–24)
BUN: 14 mg/dL (ref 8–27)
Bilirubin Total: 0.4 mg/dL (ref 0.0–1.2)
CO2: 24 mmol/L (ref 20–29)
Calcium: 9.2 mg/dL (ref 8.6–10.2)
Chloride: 103 mmol/L (ref 96–106)
Creatinine, Ser: 1.8 mg/dL — ABNORMAL HIGH (ref 0.76–1.27)
GFR calc Af Amer: 40 mL/min/{1.73_m2} — ABNORMAL LOW (ref >59)
GFR calc non Af Amer: 35 mL/min/{1.73_m2} — ABNORMAL LOW (ref >59)
Globulin, Total: 2.7 g/dL (ref 1.5–4.5)
Glucose: 140 mg/dL — ABNORMAL HIGH (ref 65–99)
Potassium: 4.6 mmol/L (ref 3.5–5.2)
Sodium: 144 mmol/L (ref 134–144)
Total Protein: 6.5 g/dL (ref 6.0–8.5)

## 2019-09-07 LAB — HEMOGLOBIN A1C
Est. average glucose Bld gHb Est-mCnc: 146 mg/dL
Hgb A1c MFr Bld: 6.7 % — ABNORMAL HIGH (ref 4.8–5.6)

## 2019-09-10 ENCOUNTER — Telehealth: Payer: Self-pay

## 2019-09-10 NOTE — Telephone Encounter (Signed)
-----   Message from Virginia Crews, MD sent at 09/10/2019  8:06 AM EST ----- Normal labs, except slight decrease in kidney function from baseline.  Recommend avoiding NSAIDs and recheck in 1 month

## 2019-09-10 NOTE — Telephone Encounter (Signed)
Pt advised.   Thanks,   -Anvika Gashi  

## 2019-09-10 NOTE — Telephone Encounter (Signed)
Patients wife was advised to avoid Nsaids such as Ibuprofen, Motrin, Naproxen , Aleve and Aspirin. Provider is aware that patient is on baby Aspirin. KW

## 2019-09-10 NOTE — Telephone Encounter (Signed)
Pts wife called and states that she has more questions for the nurse about baby Asprin and wether the pt is allowed to take it. Please advise.

## 2019-09-13 ENCOUNTER — Ambulatory Visit: Payer: Medicare HMO | Attending: Internal Medicine

## 2019-09-13 DIAGNOSIS — Z23 Encounter for immunization: Secondary | ICD-10-CM

## 2019-09-13 NOTE — Progress Notes (Signed)
   Covid-19 Vaccination Clinic  Name:  Bobby Peterson    MRN: [id number] DOB: [birth year]  09/13/2019  [name] was observed post Covid-19 immunization for 15 minutes without incidence. He was provided with Vaccine Information Sheet and instruction to access the V-Safe system.   [name] was instructed to call 911 with any severe reactions post vaccine: [name] Difficulty breathing  . Swelling of your face and throat  . A fast heartbeat  . A bad rash all over your body  . Dizziness and weakness    Immunizations Administered    Name Date Dose VIS Date Route   Pfizer COVID-19 Vaccine 09/13/2019 10:00 AM 0.3 mL 07/12/2019 Intramuscular   Manufacturer: [employer]   Lot: [id number]   [place]: [id number]

## 2019-09-17 ENCOUNTER — Telehealth: Payer: Self-pay

## 2019-09-17 ENCOUNTER — Ambulatory Visit: Payer: Self-pay

## 2019-09-17 NOTE — Chronic Care Management (AMB) (Signed)
  Chronic Care Management   Outreach Note  09/17/2019 Name: Bobby Peterson MRN: [id number] DOB: April 13, 1941  Primary Care Provider: Virginia Crews, MD Reason for referral : Chronic Care Management   An unsuccessful telephone outreach was attempted today. [name] is currently engaged with the Chronic Case Management team and receiving assistance with chronic care management.   Follow Up Plan:  The care management team will reach out to [name] again within the next two weeks.   [facility] Latino [facility] Care Management [phone]

## 2019-09-25 DIAGNOSIS — J449 Chronic obstructive pulmonary disease, unspecified: Secondary | ICD-10-CM | POA: Diagnosis not present

## 2019-09-30 ENCOUNTER — Telehealth: Payer: Self-pay

## 2019-09-30 DIAGNOSIS — J449 Chronic obstructive pulmonary disease, unspecified: Secondary | ICD-10-CM | POA: Diagnosis not present

## 2019-09-30 NOTE — Telephone Encounter (Signed)
Right foot swelling/pain 2 days ago. Patient requesting prednisone, reports that Mikki Santee would send him prednisone to [facility] [place].

## 2019-09-30 NOTE — Telephone Encounter (Signed)
Recommend getting it evaluated.  Could be gout vs DVT vs cellulitis.  Virtual visit would be ok if preferred

## 2019-09-30 NOTE — Telephone Encounter (Signed)
Patient advised as below. Patient reports he will call back when he is able to get a ride to come in. I said it could be done virtual, still refused to schedule appt for now.

## 2019-10-01 ENCOUNTER — Ambulatory Visit (INDEPENDENT_AMBULATORY_CARE_PROVIDER_SITE_OTHER): Payer: Medicare HMO

## 2019-10-01 DIAGNOSIS — J449 Chronic obstructive pulmonary disease, unspecified: Secondary | ICD-10-CM | POA: Diagnosis not present

## 2019-10-01 DIAGNOSIS — R739 Hyperglycemia, unspecified: Secondary | ICD-10-CM

## 2019-10-01 NOTE — Chronic Care Management (AMB) (Signed)
Chronic Care Management   Follow Up Note   10/01/2019 Name: Bobby Peterson MRN: [id number] DOB: [birth year]  Primary Care Provider: Virginia Crews, Peterson Reason for referral : Chronic Care Management   Bobby Peterson is a 79 y.o. year old male who is a primary care patient of Bobby Peterson. He is currently engaged with the Chronic Care Management team. A routine telephonic outreach was conducted today with Bobby Peterson and his wife Bobby Peterson.   Bobby Peterson received the initial Covid-19 vaccination dose. Anticipates receiving the second dose within the next few weeks.   Review of Bobby Peterson status, including review of consultants reports, relevant labs and test results was conducted today. Collaboration with appropriate care team members was performed as part of the comprehensive evaluation and provision of chronic care management services.    SDOH (Social Determinants of Health) assessments performed: Yes    Outpatient Encounter Medications as of 10/01/2019  Medication Sig  . albuterol (VENTOLIN HFA) 108 (90 Base) MCG/ACT inhaler Inhale into the lungs every 6 (six) hours as needed for wheezing or shortness of breath.  [name] aspirin 81 MG tablet Take 81 mg by mouth daily.  . furosemide (LASIX) 40 MG tablet Take 1 tablet (40 mg total) by mouth daily.  [name] gabapentin (NEURONTIN) 300 MG capsule Take 1 capsule (300 mg total) by mouth 2 (two) times daily.  [name] ipratropium-albuterol (DUONEB) 0.5-2.5 (3) MG/3ML SOLN Take 3 mLs by nebulization 4 (four) times daily as needed.  . metoprolol succinate (TOPROL-XL) 50 MG 24 hr tablet TAKE 3 TABLETS (150 MG TOTAL)  DAILY. TAKE WITH OR IMMEDIATELY FOLLOWING A MEAL.  . nitrofurantoin, macrocrystal-monohydrate, (MACROBID) 100 MG capsule Take 1 capsule (100 mg total) by mouth at bedtime.  [name] omeprazole (PRILOSEC) 40 MG capsule TAKE 1 CAPSULE DAILY BEFORE BREAKFAST.  [name] OXYGEN Inhale 2 L/min into the lungs continuous.   . pravastatin (PRAVACHOL) 20 MG  tablet Take 1 tablet (20 mg total) by mouth daily.  . tamsulosin (FLOMAX) 0.4 MG CAPS capsule Take 1 capsule (0.4 mg total) by mouth daily.  . vitamin B-12 (CYANOCOBALAMIN) 1000 MCG tablet Take 1,000 mcg by mouth once a week. *Take on Monday*   No facility-administered encounter medications on file as of 10/01/2019.     Objective:  BP Readings from Last 3 Encounters:  09/06/19 (!) 124/58  06/05/19 124/64  04/24/19 110/76    Wt Readings from Last 3 Encounters:  09/06/19 184 lb (83.5 kg)  06/05/19 176 lb (79.8 kg)  04/24/19 170 lb 9.6 oz (77.4 kg)    Lab Results  Component Value Date   HGBA1C 6.7 (H) 09/06/2019    Goals Addressed            This Visit's Progress   . "I'm going to take care of him and I'll let you all know if we need any help" (pt-stated)       Current Barriers:  . 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    Case Manager Clinical Goal(s):  [name] Over the next 90 days, patient will work with care management team to address needs related to management of COPD/Chronic Respiratory Failure . Over the next 90 days, patient will not be hospitalized for complications related to chronic illnesses. . Over the next 90 days, patient will take all medications as prescribed. . Over the next 90 days, patient will adhere to provider recommendations and attend scheduled medical appointments . Over the next 90 days, patient  will follow recommended safety measures to prevent falls and injuries.  Interventions:  . Reviewed medications with patient's spouse Bobby Peterson. Discussed indications for use. Encouraged to ensure [name] takes medications as prescribed. Reports that he is taking medications as prescribed. . Provided education/reviewed s/sx of complications related COPD/Chronic Respiratory failures. Encouraged to adhere to provider's treatment recommendations. Discussed indications for seeking immediate medical attention. Reports doing well .  Denies episodes of shortness of breath. Received 1st Covid-19 vaccination dose. . Reviewed recent labs. [name] confirmed knowledge of current A1C. Per spouse, he refuses to monitor his blood glucose levels but will monitor dietary intake. Encouraged to adhere to recommended diabetic/cardiac prudent diet. . Provided education regarding safety and fall prevention measures.  Reports limited mobility due to swelling in his right foot. Reports the swelling is due to gout. Denies need for evaluation. [name] agreed to call if the swelling in [name] foot doesn't resolve or worsens. . Reviewed pending appointments. Encouraged to attend appointments as scheduled to prevent delays in care. Encouraged to notify care management team if transportation assistance is needed. [name] reports injuring her shoulder and currently being unable to drive. She agreed to notify team if [name] requires transportation assistance.   Patient Self Care Activities:  . Calls pharmacy for medication refills . Spouse assist with medication management.   Please see past updates related to this goal by clicking on the "Past Updates" button in the selected goal          PLAN The care management team will reach out to [name] again in two weeks to ensure swelling has resolved.    [facility] Latino [facility] Practice/[facility] Care Management [phone]

## 2019-10-08 ENCOUNTER — Ambulatory Visit: Payer: Medicare HMO | Attending: Internal Medicine

## 2019-10-08 DIAGNOSIS — Z23 Encounter for immunization: Secondary | ICD-10-CM | POA: Insufficient documentation

## 2019-10-08 NOTE — Progress Notes (Signed)
   Covid-19 Vaccination Clinic  Name:  ARGUS VANDECAR    MRN: [id number] DOB: [birth year]  10/08/2019  Mr. Maros was observed post Covid-19 immunization for 15 minutes without incident. He was provided with Vaccine Information Sheet and instruction to access the V-Safe system.   Mr. Najjar was instructed to call 911 with any severe reactions post vaccine: [name] Difficulty breathing  . Swelling of face and throat  . A fast heartbeat  . A bad rash all over body  . Dizziness and weakness   Immunizations Administered    Name Date Dose VIS Date Route   Pfizer COVID-19 Vaccine 10/08/2019 10:09 AM 0.3 mL 07/12/2019 Intramuscular   Manufacturer: [place]   Lot: [id number]   [place]: [id number]

## 2019-10-15 ENCOUNTER — Ambulatory Visit: Payer: Self-pay

## 2019-10-15 NOTE — Chronic Care Management (AMB) (Signed)
  Chronic Care Management   Note  10/15/2019 Name: Bobby Peterson MRN: [id number] DOB: February 14, 1941     Brief outreach with Mr. and Mrs. Hirschfeld. Spouse reports that [name] is doing well. Reports swelling has resolved. Denies urgent concerns. Agreed to call if his condition changes or assistance is needed.    Follow up plan: The care management team will follow up within the next 3 months.    Horris Latino [facility] Practice/[facility] Care Management [phone]

## 2019-10-23 DIAGNOSIS — J449 Chronic obstructive pulmonary disease, unspecified: Secondary | ICD-10-CM | POA: Diagnosis not present

## 2019-10-31 DIAGNOSIS — J449 Chronic obstructive pulmonary disease, unspecified: Secondary | ICD-10-CM | POA: Diagnosis not present

## 2019-11-06 ENCOUNTER — Other Ambulatory Visit: Payer: Self-pay | Admitting: Family Medicine

## 2019-11-06 NOTE — Telephone Encounter (Signed)
Requested medication (s) are due for refill today - yes  Requested medication (s) are on the active medication list -yes  Future visit scheduled -yes  Last refill: 08/07/19  Notes to clinic: Request for medication not assigned to protocol  Requested Prescriptions  Pending Prescriptions Disp Refills   nitrofurantoin, macrocrystal-monohydrate, (MACROBID) 100 MG capsule [Pharmacy Med Name: NITROFURANTOIN MONOHYDRATE 100 MG Capsule] 90 capsule 1    Sig: Take 1 capsule (100 mg total) by mouth at bedtime.      Off-Protocol Failed - 11/06/2019  1:40 PM      Failed - Medication not assigned to a protocol, review manually.      Passed - Valid encounter within last 12 months    Recent Outpatient Visits           2 months ago Encounter for annual physical exam   [facility] [place], Dionne Bucy, [place]   8 months ago Atherosclerosis of native coronary artery of native heart without angina pectoris   [facility], Dionne Bucy, MD   11 months ago Essential hypertension   [facility], Dionne Bucy, MD   1 year ago Rectal bleeding   [place], [place]   1 year ago Pneumonia of right lower lobe due to Streptococcus pneumoniae [facility])   [place], Herbie [place], [place]       Future Appointments             In 4 months Bacigalupo, Dionne Bucy, MD [facility]                Requested Prescriptions  Pending Prescriptions Disp Refills   nitrofurantoin, macrocrystal-monohydrate, (MACROBID) 100 MG capsule [Pharmacy Med Name: NITROFURANTOIN MONOHYDRATE 100 MG Capsule] 90 capsule 1    Sig: Take 1 capsule (100 mg total) by mouth at bedtime.      Off-Protocol Failed - 11/06/2019  1:40 PM      Failed - Medication not assigned to a protocol, review manually.      Passed - Valid encounter within last 12 months    Recent Outpatient Visits           2 months ago Encounter for annual  physical exam   [facility] [place], Dionne Bucy, MD   8 months ago Atherosclerosis of native coronary artery of native heart without angina pectoris   [facility], Dionne Bucy, MD   11 months ago Essential hypertension   [place], Dionne Bucy, MD   1 year ago Rectal bleeding   [place], [place]   1 year ago Pneumonia of right lower lobe due to Streptococcus pneumoniae [facility] [place])   [facility] [place], [place]       Future Appointments             In 4 months Bacigalupo, Dionne Bucy, MD [facility] [place]

## 2019-11-19 NOTE — Patient Instructions (Addendum)
Thank you for allowing the Chronic Care Management team to participate in your care.  Goals Addressed            This Visit's Progress   . "I'm going to take care of him and I'll let you all know if we need any help" (pt-stated)       Current Barriers:  . 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    Case Manager Clinical Goal(s):  [name] Over the next 90 days, patient will work with care management team to address needs related to management of COPD/Chronic Respiratory Failure . Over the next 90 days, patient will not be hospitalized for complications related to chronic illnesses. . Over the next 90 days, patient will take all medications as prescribed. . Over the next 90 days, patient will adhere to provider recommendations and attend scheduled medical appointments . Over the next 90 days, patient will follow recommended safety measures to prevent falls and injuries.  Interventions:  . Reviewed medications with patient's spouse [name]. Discussed indications for use. Encouraged to ensure Mr. Domond takes medications as prescribed. Reports that he is taking medications as prescribed. . Provided education/reviewed s/sx of complications related COPD/Chronic Respiratory failures. Encouraged to adhere to provider's treatment recommendations. Discussed indications for seeking immediate medical attention. Reports doing well . Denies episodes of shortness of breath. Received 1st Covid-19 vaccination dose. . Reviewed recent labs. Mr. Secrease confirmed knowledge of current A1C. Per spouse, he refuses to monitor his blood glucose levels but will monitor dietary intake. Encouraged to adhere to recommended diabetic/cardiac prudent diet. . Provided education regarding safety and fall prevention measures.  Reports limited mobility due to swelling in his right foot. Reports the swelling is due to gout. Denies need for evaluation. Mrs. Amer agreed to call if the swelling in  Mr. Marcos foot doesn't resolve or worsens. . Reviewed pending appointments. Encouraged to attend appointments as scheduled to prevent delays in care. Encouraged to notify care management team if transportation assistance is needed. Mrs. Vardeman reports injuring her shoulder and currently being unable to drive. She agreed to notify team if Mr. Snellen requires transportation assistance.   Patient Self Care Activities:  . Calls pharmacy for medication refills . Spouse assist with medication management.   Please see past updates related to this goal by clicking on the "Past Updates" button in the selected goal         Mr. and Mrs. Franta verbalized understanding of the instructions provided during the telephonic outreach today. Declined need for a mailed/printed copy of the instructions.   The care management team will reach out to Mr. Ellett again in two weeks to ensure swelling has resolved.   [facility] Latino [facility] Practice/[facility] Care Management [phone]

## 2019-11-19 NOTE — Chronic Care Management (AMB) (Signed)
  Chronic Care Management   Note for 07/18/19     Name: Bobby Peterson MRN: [id number] DOB: [birth year]    Brief outreach with Bobby Peterson spouse Bobby Peterson regarding medications. She confirmed that he has all required prescriptions. Reports that he is doing well. Denies urgent needs or health concerns.   Follow up plan: The care management team will follow up in three months.    Horris Latino [facility] Practice/[facility] Care Management [phone]

## 2019-11-19 NOTE — Patient Instructions (Addendum)
Thank you for allowing the Chronic Care Management team to participate in your care.  Goals Addressed            This Visit's Progress   . "I'm going to take care of him and I'll let you all know if we need any help" (pt-stated)       Current Barriers:  . 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 Over the next 90 days, patient will not be hospitalized for complications related to chronic illnesses. . Over the next 90 days, patient will take all medications as prescribed. . Over the next 90 days, patient will adhere to provider recommendations and attend scheduled medical appointments . Over the next 90 days, patient will follow recommended safety measures to prevent falls and injuries.  Interventions:  . Reviewed medications with patient's spouse [name]. Discussed indications for use. Encouraged to ensure Mr. Bazan takes medications as prescribed. Encouraged to notify care management team with concerns regarding prescription costs. . Provided education/reviewed s/sx of complications related COPD/Chronic Respiratory failures. Encouraged to adhere to provider's treatment recommendations. Discussed indications for seeking immediate medical attention. Reports that Mr. Stockdale uses oxygen as needed and routinely monitors his oxygen saturation. Unable to recall oxygen settings. Reports O2 saturations have been within range. . Reviewed pending appointments. Encouraged to attend appointments as scheduled to prevent delays in care. Encouraged to notify care management team if transportation assistance is needed. . Provided education regarding safety and fall prevention measures.    Patient Self Care Activities:  . Calls pharmacy for medication refills . Spouse assist with medication  management . Spouse provides transportation    Please see past updates related to this goal by clicking on the "Past Updates" button in the selected goal        Mr. And Mrs. Garbers verbalized understanding of the instructions provided during the telephonic outreach today. Declined need for a mailed/printed copy of the instructions.   Mr. And Mrs. Fradette declined need for frequent outreach from the care management team. Agreeable to outreach every three month and as needed. Encouraged to call with any health related concerns if needed.   The care management team will follow up within the next three months.    [facility] Latino [facility] Practice/[facility] Care Management [phone]

## 2019-11-23 DIAGNOSIS — J449 Chronic obstructive pulmonary disease, unspecified: Secondary | ICD-10-CM | POA: Diagnosis not present

## 2019-11-30 DIAGNOSIS — J449 Chronic obstructive pulmonary disease, unspecified: Secondary | ICD-10-CM | POA: Diagnosis not present

## 2019-12-23 DIAGNOSIS — J449 Chronic obstructive pulmonary disease, unspecified: Secondary | ICD-10-CM | POA: Diagnosis not present

## 2019-12-31 DIAGNOSIS — J449 Chronic obstructive pulmonary disease, unspecified: Secondary | ICD-10-CM | POA: Diagnosis not present

## 2020-01-02 ENCOUNTER — Ambulatory Visit: Payer: Self-pay

## 2020-01-02 DIAGNOSIS — J449 Chronic obstructive pulmonary disease, unspecified: Secondary | ICD-10-CM

## 2020-01-08 NOTE — Patient Instructions (Addendum)
Thank you for allowing the Chronic Care Management team to participate in your care.  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    Case Manager Clinical Goal(s):   Over the next 90 days, patient will work with care management team to address needs related to management of COPD/Chronic Respiratory Failure  Over the next 90 days, patient will not be hospitalized for complications related to chronic illnesses.  Over the next 90 days, patient will take all medications as prescribed.  Over the next 90 days, patient will adhere to provider recommendations and attend scheduled medical appointments  Over the next 90 days, patient will follow recommended safety measures to prevent falls and injuries.  Interventions:   Discussed plan for on going care management. Patient's spouse [name] reports that he is doing well. She continues to assist with medications and transportation for appointments. Denied urgent concerns or changes in care management needs. Reports Mr. Mcclish does not require additional assistance. Agreed to notify PCP or contact care management team if his status changes/declines and additional assistance is needed.   Patient Self Care Activities:   Calls pharmacy for medication refills  Spouse assist with medication management.   Please see past updates related to this goal by clicking on the "Past Updates" button in the selected goal         Mr. Reier caregiver/spouse verbalized understanding of the information discussed during the brief telephonic outreach today. Declined need for mailed/printed instructions.    Mr. Fluke caregiver/spouse reports that he is doing very well. Decline urgent concerns or need for additional assistance. No further outreach  required. Mrs. Bogosian agreed to notify PCP or contact the care management team directly if Mr. Ruppert status declines and additional assistance is needed.    Horris Latino [facility] Practice/[facility] Care Management [phone]

## 2020-01-08 NOTE — Chronic Care Management (AMB) (Signed)
Chronic Care Management   Follow Up Note    Name: Bobby Peterson MRN: [id number] DOB: 1940-10-19  Primary Care Provider: Virginia Crews, MD Reason for referral : Chronic Care Management   Bobby Peterson is a 79 y.o. year old male who is a primary care patient of Bacigalupo, Dionne Bucy, MD. The CCM team was initially consulted for assistance with chronic disease management and care coordination. A brief telephonic outreach was conducted today with his spouse Bobby Peterson.  Review of Bobby Peterson status, including review of consultants reports, relevant labs and test results was conducted today. Collaboration with appropriate care team members was performed as part of the comprehensive evaluation and provision of chronic care management services.  [name]    SDOH (Social Determinants of Health) assessments performed: No     Outpatient Encounter Medications as of 01/02/2020  Medication Sig  . albuterol (VENTOLIN HFA) 108 (90 Base) MCG/ACT inhaler Inhale into the lungs every 6 (six) hours as needed for wheezing or shortness of breath.  [name] aspirin 81 MG tablet Take 81 mg by mouth daily.  . furosemide (LASIX) 40 MG tablet Take 1 tablet (40 mg total) by mouth daily.  [name] gabapentin (NEURONTIN) 300 MG capsule Take 1 capsule (300 mg total) by mouth 2 (two) times daily.  [name] ipratropium-albuterol (DUONEB) 0.5-2.5 (3) MG/3ML SOLN Take 3 mLs by nebulization 4 (four) times daily as needed.  . metoprolol succinate (TOPROL-XL) 50 MG 24 hr tablet TAKE 3 TABLETS (150 MG TOTAL)  DAILY. TAKE WITH OR IMMEDIATELY FOLLOWING A MEAL.  . nitrofurantoin, macrocrystal-monohydrate, (MACROBID) 100 MG capsule TAKE 1 CAPSULE (100 MG TOTAL) BY MOUTH AT BEDTIME.  [name] omeprazole (PRILOSEC) 40 MG capsule TAKE 1 CAPSULE DAILY BEFORE BREAKFAST.  [name] OXYGEN Inhale 2 L/min into the lungs continuous.   . pravastatin (PRAVACHOL) 20 MG tablet Take 1 tablet (20 mg total) by mouth daily.  . tamsulosin (FLOMAX) 0.4 MG CAPS capsule Take 1  capsule (0.4 mg total) by mouth daily.  . vitamin B-12 (CYANOCOBALAMIN) 1000 MCG tablet Take 1,000 mcg by mouth once a week. *Take on Monday*   No facility-administered encounter medications on file as of 01/02/2020.      Goals Addressed            This Visit's Progress   . "I'm going to take care of him and I'll let you all know if we need any help" (pt-stated)       Current Barriers:  . 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    Case Manager Clinical Goal(s):  [name] Over the next 90 days, patient will work with care management team to address needs related to management of COPD/Chronic Respiratory Failure . Over the next 90 days, patient will not be hospitalized for complications related to chronic illnesses. . Over the next 90 days, patient will take all medications as prescribed. . Over the next 90 days, patient will adhere to provider recommendations and attend scheduled medical appointments . Over the next 90 days, patient will follow recommended safety measures to prevent falls and injuries.  Interventions:  . Discussed plan for on going care management. Patient's spouse Bobby Peterson reports that he is doing well. She continues to assist with medications and transportation for appointments. Denied urgent concerns or changes in care management needs. Reports Bobby Peterson does not require additional assistance. Agreed to notify PCP or contact care management team if his status changes/declines and additional assistance is needed.   Patient Self Care  Activities:  . Calls pharmacy for medication refills . Spouse assist with medication management.   Please see past updates related to this goal by clicking on the "Past Updates" button in the selected goal          PLAN Mr. Granier caregiver/spouse reports that he is doing very well. Decline urgent concerns or need for additional assistance. No further outreach required. [name] agreed to notify  PCP or contact the care management team directly if Bobby Peterson status declines and additional assistance is needed.    Bobby Peterson [facility] Care Management [phone]

## 2020-01-23 DIAGNOSIS — J449 Chronic obstructive pulmonary disease, unspecified: Secondary | ICD-10-CM | POA: Diagnosis not present

## 2020-01-30 DIAGNOSIS — J449 Chronic obstructive pulmonary disease, unspecified: Secondary | ICD-10-CM | POA: Diagnosis not present

## 2020-01-31 ENCOUNTER — Other Ambulatory Visit: Payer: Self-pay | Admitting: Physician Assistant

## 2020-01-31 NOTE — Telephone Encounter (Signed)
Requested medication (s) are due for refill today: no  Requested medication (s) are on the active medication list: yes  Last refill: 11/16/2019  Future visit scheduled: yes  Notes to clinic:  medication not assigned to a protocol   Requested Prescriptions  Pending Prescriptions Disp Refills   nitrofurantoin, macrocrystal-monohydrate, (MACROBID) 100 MG capsule [Pharmacy Med Name: NITROFURANTOIN MONOHYDRATE 100 MG Capsule] 90 capsule 0    Sig: TAKE 1 CAPSULE AT BEDTIME      Off-Protocol Failed - 01/31/2020 12:33 PM      Failed - Medication not assigned to a protocol, review manually.      Passed - Valid encounter within last 12 months    Recent Outpatient Visits           4 months ago Encounter for annual physical exam   [facility] [place], Dionne Bucy, MD   11 months ago Atherosclerosis of native coronary artery of native heart without angina pectoris   [facility], Dionne Bucy, MD   1 year ago Essential hypertension   [place], Dionne Bucy, MD   1 year ago Rectal bleeding   [place], [place]   1 year ago Pneumonia of right lower lobe due to Streptococcus pneumoniae [facility])   [facility] [place], [place]       Future Appointments             In 1 month Bacigalupo, Dionne Bucy, MD [facility], [place]

## 2020-02-22 DIAGNOSIS — J449 Chronic obstructive pulmonary disease, unspecified: Secondary | ICD-10-CM | POA: Diagnosis not present

## 2020-03-01 DIAGNOSIS — J449 Chronic obstructive pulmonary disease, unspecified: Secondary | ICD-10-CM | POA: Diagnosis not present

## 2020-03-05 ENCOUNTER — Ambulatory Visit: Payer: Medicare HMO | Admitting: Family Medicine

## 2020-03-24 DIAGNOSIS — J449 Chronic obstructive pulmonary disease, unspecified: Secondary | ICD-10-CM | POA: Diagnosis not present

## 2020-03-30 ENCOUNTER — Other Ambulatory Visit: Payer: Self-pay

## 2020-03-30 ENCOUNTER — Encounter: Payer: Self-pay | Admitting: Family Medicine

## 2020-03-30 ENCOUNTER — Ambulatory Visit: Payer: Self-pay

## 2020-03-30 ENCOUNTER — Telehealth (INDEPENDENT_AMBULATORY_CARE_PROVIDER_SITE_OTHER): Payer: Medicare HMO | Admitting: Family Medicine

## 2020-03-30 DIAGNOSIS — M10371 Gout due to renal impairment, right ankle and foot: Secondary | ICD-10-CM

## 2020-03-30 MED ORDER — COLCHICINE 0.6 MG PO TABS: ORAL_TABLET | ORAL | 0 refills | Status: DC

## 2020-03-30 NOTE — Progress Notes (Signed)
Virtual telephone visit    Virtual Visit via Telephone Note   This visit type was conducted due to national recommendations for restrictions regarding the COVID-19 Pandemic (e.g. social distancing) in an effort to limit this patient's exposure and mitigate transmission in our community. Due to his co-morbid illnesses, this patient is at least at moderate risk for complications without adequate follow up. This format is felt to be most appropriate for this patient at this time. The patient did not have access to video technology or had technical difficulties with video requiring transitioning to audio format only (telephone). Physical exam was limited to content and character of the telephone converstion.     Patient location: home Provider location: [place] involved in the visit: patient, provider   I discussed the limitations of evaluation and management by telemedicine and the availability of in person appointments. The patient expressed understanding and agreed to proceed.    Visit Date: 03/30/2020  Today's healthcare provider: Lavon Paganini, MD   Chief Complaint  Patient presents with  . Gout   Subjective    HPI  Patient C/O right foot gout x 4 days. Patient reports pain, redness, and swelling. Feels like similar episodes of gout in the past.  Has not had flare in a few years.     Patient Active Problem List   Diagnosis Date Noted  . 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 Smoking 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 pack years 05/10/2016  . Allergic rhinitis 07/08/2015  . 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 Coronary atherosclerosis 07/08/2015  . Chronic diastolic heart failure ([place]) 07/08/2015  . Chronic kidney disease (CKD), stage III (moderate) 07/08/2015  . Elevated blood sugar 07/08/2015  . Esophageal reflux 07/08/2015  . HLD (hyperlipidemia) 07/08/2015  . Essential hypertension  07/08/2015  . Calculus of kidney 07/08/2015  . COPD (chronic obstructive pulmonary disease) ([place]) 12/03/2013  . Chronic hypoxemic respiratory failure ([place]) 12/03/2013   Social History   Tobacco Use  . Smoking status: Former Smoker    Packs/day: 1.00    Years: 50.00    Pack years: 50.00    Types: Cigarettes    Quit date: 12/04/2003    Years since quitting: 16.3  . Smokeless tobacco: Current User    Types: Chew  . Tobacco comment: Has been chewing tobacco since quitting smoking in 2005.  Substance Use Topics  . Alcohol use: No  . Drug use: No   Allergies  Allergen Reactions  . Fenofibrate     Other reaction(s): Headache  . Lisinopril Cough  . Niacin     Other reaction(s): Dizziness  . Phenergan [Promethazine Hcl]     NVD  . Simvastatin     Other reaction(s): Headache      Medications: Outpatient Medications Prior to Visit  Medication Sig  . albuterol (VENTOLIN HFA) 108 (90 Base) MCG/ACT inhaler Inhale into the lungs every 6 (six) hours as needed for wheezing or shortness of breath.  [name] aspirin 81 MG tablet Take 81 mg by mouth daily.  . furosemide (LASIX) 40 MG tablet Take 1 tablet (40 mg total) by mouth daily.  [name] gabapentin (NEURONTIN) 300 MG capsule Take 1 capsule (300 mg total) by mouth 2 (two) times daily.  . metoprolol succinate (TOPROL-XL) 50 MG 24 hr tablet TAKE 3 TABLETS (150 MG TOTAL)  DAILY. TAKE WITH OR IMMEDIATELY FOLLOWING A MEAL.  . nitrofurantoin, macrocrystal-monohydrate, (MACROBID) 100 MG capsule TAKE 1 CAPSULE  AT BEDTIME  . omeprazole (PRILOSEC) 40 MG capsule TAKE 1 CAPSULE DAILY BEFORE BREAKFAST.  [name] OXYGEN Inhale 2 L/min into the lungs continuous.   . pravastatin (PRAVACHOL) 20 MG tablet Take 1 tablet (20 mg total) by mouth daily.  . tamsulosin (FLOMAX) 0.4 MG CAPS capsule Take 1 capsule (0.4 mg total) by mouth daily.  . vitamin B-12 (CYANOCOBALAMIN) 1000 MCG tablet Take 1,000 mcg by mouth once a week. *Take on Monday*  . ipratropium-albuterol (DUONEB)  0.5-2.5 (3) MG/3ML SOLN Take 3 mLs by nebulization 4 (four) times daily as needed. (Patient not taking: Reported on 03/30/2020)   No facility-administered medications prior to visit.    Review of Systems  Constitutional: Negative.   Respiratory: Positive for cough and shortness of breath.   Cardiovascular: Negative for chest pain and palpitations.  Musculoskeletal:       Right foot pain    Last CBC Lab Results  Component Value Date   WBC 9.8 04/09/2019   HGB 13.2 04/09/2019   HCT 40.2 04/09/2019   MCV 87 04/09/2019   MCH 28.4 04/09/2019   RDW 14.7 04/09/2019   PLT 285 04/09/2019      Objective    There were no vitals taken for this visit.    Speaking in full sentences in NAD Video was sent by patients daughter that showed him comfortably sitting in chair with Pinch O2 in place R foot swollen with erythema at ankle and 1st MCP No calf swelling on tenderness exhibited   Assessment & Plan     1. 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 - recurrent problem - no flares in last few years - is on loop diuretic, not thiazide diuretic, but needs this for HFpEF -Treat with colchicine until resolution -Discussed return precautions -Calf does not appear edematous and limiting exam is not concerning for DVT at this time, but did give return precautions around this  Meds ordered this encounter  Medications  . colchicine 0.6 MG tablet    Sig: Take 1.2mg  once at first sign of gout flare and then 0.6mg  1 hour later, then 0.6mg  BID until resolution    Dispense:  20 tablet    Refill:  0    Return if symptoms worsen or fail to improve.    I discussed the assessment and treatment plan with the patient. The patient was provided an opportunity to ask questions and all were answered. The patient agreed with the plan and demonstrated an understanding of the instructions.   The patient was advised to call back or seek an in-person evaluation if the symptoms worsen or if  the condition fails to improve as anticipated.  I provided 15 minutes of non-face-to-face time during this encounter.  I, Lavon Paganini, MD, have reviewed all documentation for this visit. The documentation on 03/31/20 for the exam, diagnosis, procedures, and orders are all accurate and complete.   Lydie Stammen, Dionne Bucy, MD, MPH [facility]

## 2020-03-30 NOTE — Telephone Encounter (Signed)
Pt. And wife report pt.'s right foot started swelling 2 days ago. Foot is red. States he "has had gout before." No known injury, no open skin areas. Hurts to walk on foot.Pt. Does not have transportation to come in today. No virtual visit available. Please advise pt.  Reason for Disposition . [1] Swollen foot AND [2] no fever  (Exceptions: localized bump from bunions, calluses, insect bite, sting)  Answer Assessment - Initial Assessment Questions 1. ONSET: "When did the pain start?"      2 days ago 2. LOCATION: "Where is the pain located?"      Right foot 3. PAIN: "How bad is the pain?"    (Scale 1-10; or mild, moderate, severe)  - MILD (1-3): doesn't interfere with normal activities.   - MODERATE (4-7): interferes with normal activities (e.g., work or school) or awakens from sleep, limping.   - SEVERE (8-10): excruciating pain, unable to do any normal activities, unable to walk.      10 4. WORK OR EXERCISE: "Has there been any recent work or exercise that involved this part of the body?"      No 5. CAUSE: "What do you think is causing the foot pain?"     Gout 6. OTHER SYMPTOMS: "Do you have any other symptoms?" (e.g., leg pain, rash, fever, numbness)     Pain 7. PREGNANCY: "Is there any chance you are pregnant?" "When was your last menstrual period?"     n/a  Protocols used: FOOT PAIN-A-AH

## 2020-03-30 NOTE — Telephone Encounter (Signed)
Patient is going to call back to get help for the virtual

## 2020-03-30 NOTE — Telephone Encounter (Signed)
Really needs to be seen in some capacity to evaluate for gout, but also rule out DVT.  Bobby Peterson's 11 am is open (could use for virtual visit) or we can try to fit into my 240 that is blocked

## 2020-04-01 DIAGNOSIS — J449 Chronic obstructive pulmonary disease, unspecified: Secondary | ICD-10-CM | POA: Diagnosis not present

## 2020-04-19 IMAGING — DX DG CHEST 1V PORT
1 series · 1 of 1 positions shown · non-contrast
Comparison: One-view chest x-ray 02/14/2018

CLINICAL DATA: Acute respiratory failure.

EXAM:
PORTABLE CHEST 1 VIEW

[chest ap]
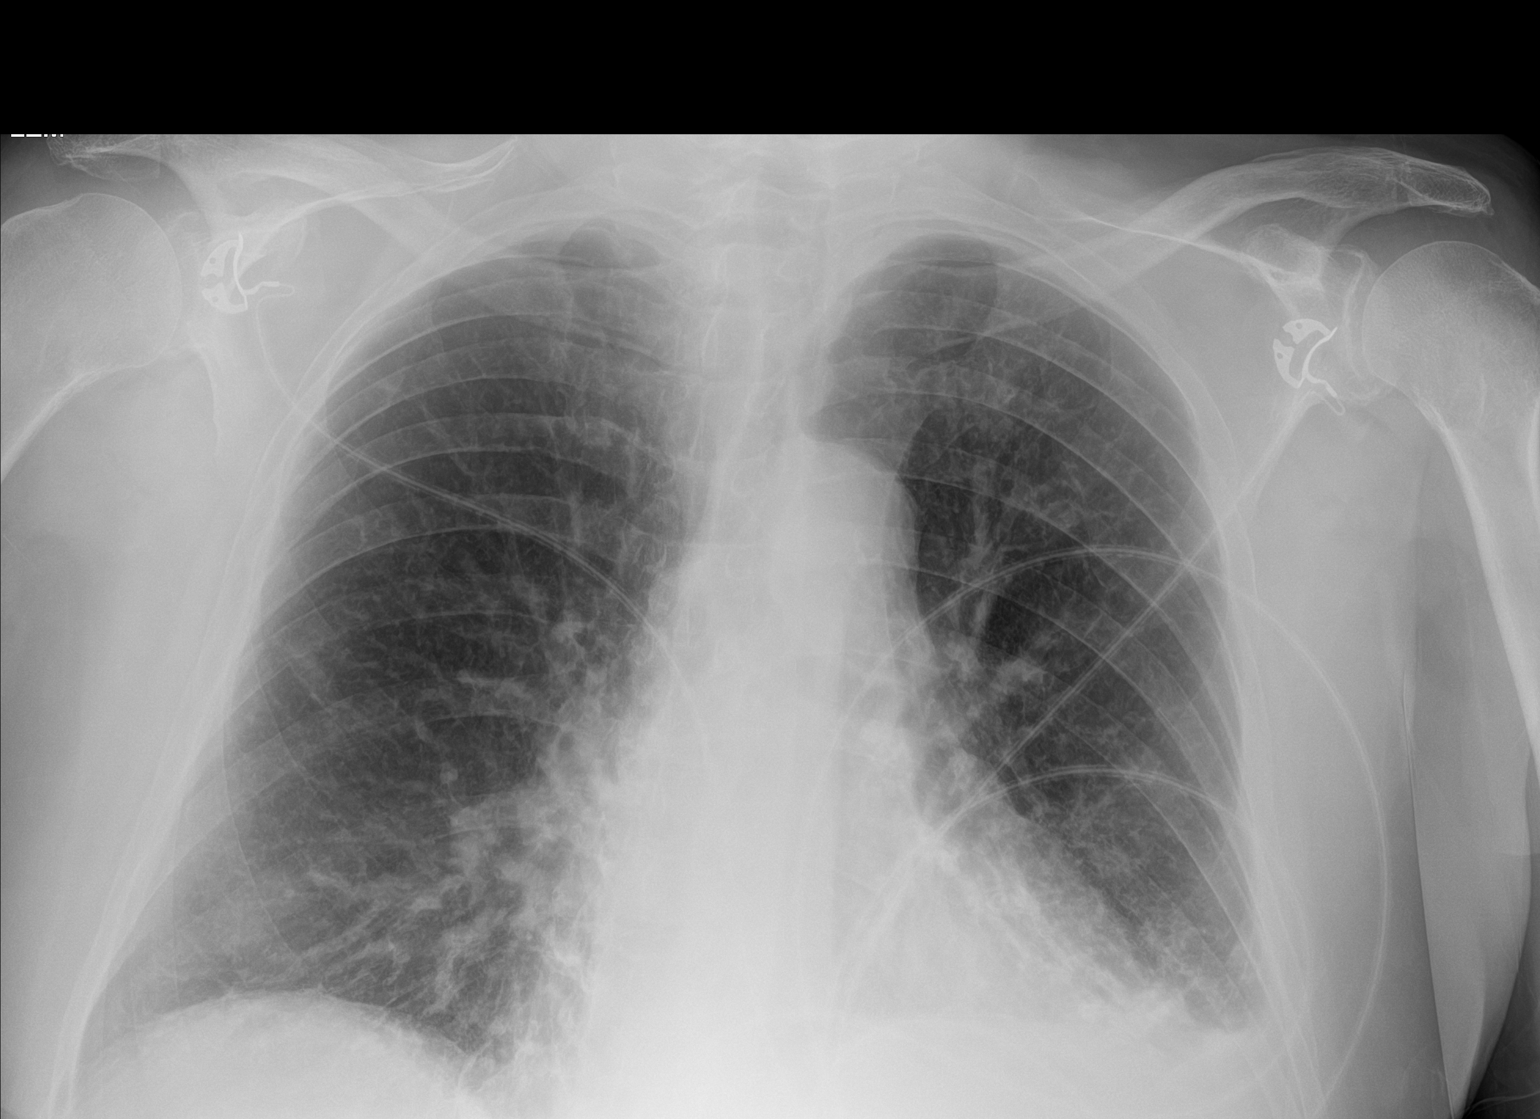

[1 of 1 positions shown; findings below may reference images not displayed]

FINDINGS: Heart is mildly enlarged. This is exaggerated by low lung volumes.
Left basilar airspace disease remains. Mild pulmonary vascular
congestion is noted.
IMPRESSION: 1. Persistent left basilar airspace disease likely reflects a
combination of effusion and atelectasis. Infection is not excluded.

## 2020-04-23 ENCOUNTER — Telehealth: Payer: Self-pay | Admitting: Family Medicine

## 2020-04-23 MED ORDER — PREDNISONE 20 MG PO TABS: ORAL_TABLET | ORAL | 0 refills | Status: DC

## 2020-04-23 NOTE — Telephone Encounter (Signed)
Patient experiencing gout flare up started last night, patient states previous medication prescribed caused diarrhea (Rx name unknown). Patient states PCP advise she would send in prednisone for any future flare ups, patient would like a follow up call today   Bobby Peterson (N), [place] Phone:  [phone]  Fax:  [phone]

## 2020-04-23 NOTE — Telephone Encounter (Signed)
Phone call ret'd. to pt's. Wife.  Stated that pt. Has had a gout flare-up of the right ankle and foot, and is requesting Rx for Prednisone.  Reported pt. Has redness and swelling of the right ankle that is starting to move into the top of foot and toes.  Reported he is not able to put full weight on the right foot.  Reported that he was given Rx for Colchicine in August, and took it for approx. One week, but stopped it due to onset of diarrhea.  Wife stated patient has had Prednisone in the past that seemed to work better than the Colchicine, and did not cause diarrhea.  Advised will send note to Dr. Jacinto Reap. To review and advise.  Wife verb. Understanding.

## 2020-04-23 NOTE — Addendum Note (Signed)
Addended by: Virginia Crews on: 04/23/2020 11:36 AM   Modules accepted: Orders

## 2020-04-23 NOTE — Telephone Encounter (Signed)
Prednisone taper sent to [place].  If not improving, consider evaluation.

## 2020-04-23 NOTE — Telephone Encounter (Signed)
Pt advised.   Thanks,   -Quanda Pavlicek  

## 2020-04-24 DIAGNOSIS — J449 Chronic obstructive pulmonary disease, unspecified: Secondary | ICD-10-CM | POA: Diagnosis not present

## 2020-04-28 ENCOUNTER — Other Ambulatory Visit: Payer: Self-pay | Admitting: Family Medicine

## 2020-04-28 DIAGNOSIS — R202 Paresthesia of skin: Secondary | ICD-10-CM

## 2020-04-28 DIAGNOSIS — I5032 Chronic diastolic (congestive) heart failure: Secondary | ICD-10-CM

## 2020-04-28 DIAGNOSIS — E782 Mixed hyperlipidemia: Secondary | ICD-10-CM

## 2020-04-28 NOTE — Telephone Encounter (Signed)
Requested medication (s) are due for refill today - yes  Requested medication (s) are on the active medication list -yes  Future visit scheduled -no  Last refill: 02/01/20  Notes to clinic: Request for medication  not assigned protocol.  Requested Prescriptions  Pending Prescriptions Disp Refills   nitrofurantoin, macrocrystal-monohydrate, (MACROBID) 100 MG capsule [Pharmacy Med Name: NITROFURANTOIN MONOHYDRATE 100 MG Capsule] 90 capsule 0    Sig: TAKE 1 CAPSULE AT BEDTIME      Off-Protocol Failed - 04/28/2020  1:58 PM      Failed - Medication not assigned to a protocol, review manually.      Passed - Valid encounter within last 12 months    Recent Outpatient Visits           4 weeks ago 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   [facility] [place], Dionne Bucy, MD   7 months ago Encounter for annual physical exam   [facility] [place], Dionne Bucy, MD   1 year ago Atherosclerosis of native coronary artery of native heart without angina pectoris   [facility], Dionne Bucy, MD   1 year ago Essential hypertension   [place], Dionne Bucy, MD   1 year ago Rectal bleeding   [place], [place]                  Requested Prescriptions  Pending Prescriptions Disp Refills   nitrofurantoin, macrocrystal-monohydrate, (MACROBID) 100 MG capsule [Pharmacy Med Name: NITROFURANTOIN MONOHYDRATE 100 MG Capsule] 90 capsule 0    Sig: TAKE 1 CAPSULE AT BEDTIME      Off-Protocol Failed - 04/28/2020  1:58 PM      Failed - Medication not assigned to a protocol, review manually.      Passed - Valid encounter within last 12 months    Recent Outpatient Visits           4 weeks ago 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   [facility] [place], Dionne Bucy, MD   7 months ago Encounter for annual physical exam   [facility]  [place], Dionne Bucy, MD   1 year ago Atherosclerosis of native coronary artery of native heart without angina pectoris   [facility], Dionne Bucy, MD   1 year ago Essential hypertension   [place], Dionne Bucy, MD   1 year ago Rectal bleeding   [facility] [place], [place]

## 2020-04-28 NOTE — Telephone Encounter (Signed)
Patient passes RF protocol- but did not have labs at last visit- note to call office to set up labs

## 2020-05-01 DIAGNOSIS — J449 Chronic obstructive pulmonary disease, unspecified: Secondary | ICD-10-CM | POA: Diagnosis not present

## 2020-06-01 DIAGNOSIS — J449 Chronic obstructive pulmonary disease, unspecified: Secondary | ICD-10-CM | POA: Diagnosis not present

## 2020-07-01 DIAGNOSIS — J449 Chronic obstructive pulmonary disease, unspecified: Secondary | ICD-10-CM | POA: Diagnosis not present

## 2020-08-01 DIAGNOSIS — J449 Chronic obstructive pulmonary disease, unspecified: Secondary | ICD-10-CM | POA: Diagnosis not present

## 2020-08-03 ENCOUNTER — Other Ambulatory Visit: Payer: Self-pay | Admitting: Family Medicine

## 2020-08-03 DIAGNOSIS — I5032 Chronic diastolic (congestive) heart failure: Secondary | ICD-10-CM

## 2020-08-03 DIAGNOSIS — E782 Mixed hyperlipidemia: Secondary | ICD-10-CM

## 2020-09-01 DIAGNOSIS — J449 Chronic obstructive pulmonary disease, unspecified: Secondary | ICD-10-CM | POA: Diagnosis not present

## 2020-09-03 NOTE — Progress Notes (Addendum)
Subjective:   Bobby Peterson is a 80 y.o. male who presents for Medicare Annual/Subsequent preventive examination.  I connected with Bobby Peterson today by telephone and verified that I am speaking with the correct person using two identifiers. Location patient: home Location provider: work Persons participating in the virtual visit: patient, provider.   I discussed the limitations, risks, security and privacy concerns of performing an evaluation and management service by telephone and the availability of in person appointments. I also discussed with the patient that there may be a patient responsible charge related to this service. The patient expressed understanding and verbally consented to this telephonic visit.    Interactive audio and video telecommunications were attempted between this provider and patient, however failed, due to patient having technical difficulties OR patient did not have access to video capability.  We continued and completed visit with audio only.   Review of Systems    N/A  Cardiac Risk Factors include: advanced age (>45men, >13 women);hypertension;male gender;dyslipidemia     Objective:    There were no vitals filed for this visit. There is no height or weight on file to calculate BMI.  Advanced Directives 09/07/2020 09/05/2019 05/01/2019 08/02/2018 08/02/2018 02/14/2018 08/23/2017  Does Patient Have a Medical Advance Directive? Yes No No No No No No  Type of Paramedic of [place];Living will - - - - - -  Copy of [place] in Chart? No - copy requested - - - - - -  Would patient like information on creating a medical advance directive? - No - Patient declined No - Patient declined No - Patient declined - No - Patient declined -  Some encounter information is confidential and restricted. Go to Review Flowsheets activity to see all data.    Current Medications: NOT verified due to patient not being able to.    Outpatient Encounter Medications as of 09/07/2020  Medication Sig  . albuterol (VENTOLIN HFA) 108 (90 Base) MCG/ACT inhaler Inhale into the lungs every 6 (six) hours as needed for wheezing or shortness of breath.  [name] aspirin 81 MG tablet Take 81 mg by mouth daily.  . colchicine 0.6 MG tablet Take 1.2mg  once at first sign of gout flare and then 0.6mg  1 hour later, then 0.6mg  BID until resolution  . furosemide (LASIX) 40 MG tablet TAKE 1 TABLET EVERY DAY  . gabapentin (NEURONTIN) 300 MG capsule TAKE 1 CAPSULE TWICE DAILY  . ipratropium-albuterol (DUONEB) 0.5-2.5 (3) MG/3ML SOLN Take 3 mLs by nebulization 4 (four) times daily as needed. (Patient not taking: Reported on 03/30/2020)  . metoprolol succinate (TOPROL-XL) 50 MG 24 hr tablet TAKE 3 TABLETS (150 MG TOTAL)  DAILY. TAKE WITH OR IMMEDIATELY FOLLOWING A MEAL.  . nitrofurantoin, macrocrystal-monohydrate, (MACROBID) 100 MG capsule TAKE 1 CAPSULE AT BEDTIME  . omeprazole (PRILOSEC) 40 MG capsule TAKE 1 CAPSULE DAILY BEFORE BREAKFAST.  [name] OXYGEN Inhale 2 L/min into the lungs continuous.   . pravastatin (PRAVACHOL) 20 MG tablet TAKE 1 TABLET (20 MG TOTAL) BY MOUTH DAILY. (NEED MD APPOINTMENT)  . predniSONE (DELTASONE) 20 MG tablet Take 40mg  daily with breakfast x4 days, then 20mg  daily with breakfast x4 days, then 10mg  daily x4d, then stop  . tamsulosin (FLOMAX) 0.4 MG CAPS capsule TAKE 1 CAPSULE EVERY DAY  . vitamin B-12 (CYANOCOBALAMIN) 1000 MCG tablet Take 1,000 mcg by mouth once a week. *Take on Monday*   No facility-administered encounter medications on file as of 09/07/2020.    Allergies (  verified) Fenofibrate, Lisinopril, Niacin, Phenergan [promethazine hcl], and Simvastatin   History: Past Medical History:  Diagnosis Date  . Acute respiratory failure with hypoxia ([place]) 12/03/2013   Overview:  Overview:  2 L O2 continuously  Last Assessment & Plan:  Continue 2 L O2 continuously   . Atrial tachycardia ([place]) 04/21/2016  . 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 BP (high blood pressure) 07/08/2015  . Chronic diastolic CHF (congestive heart failure) ([place])   . Chronic diastolic heart failure ([place]) 07/08/2015  . Chronic hypoxemic respiratory failure ([place]) 12/03/2013   2 L O2 continuously   . Chronic respiratory failure ([facility])    a. on home O2  . COPD (chronic obstructive pulmonary disease) ([place])   . COPD (chronic obstructive pulmonary disease) ([place]) 12/03/2013   May fifth 2015 simple spirometry>> ratio 45%, FEV1 0.95 L (34% per day)   . Coronary artery disease   . Coronary atherosclerosis 07/08/2015   Dr. Ubaldo Glassing   . Esophageal reflux 07/08/2015  . Hernia, umbilical   . Hypercholesteremia   . Hypertension   . Obesity   . Renal cancer ([place])    a. s/p nephrectomy in 1980's  . Smoking 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 pack years 05/10/2016   Past Surgical History:  Procedure Laterality Date  . CHOLECYSTECTOMY N/A 12/22/2014   Procedure: LAPAROSCOPIC CHOLECYSTECTOMY WITH INTRAOPERATIVE CHOLANGIOGRAM;  Surgeon: Christene Lye, MD;  Location: [facility] ORS;  Service: General;  Laterality: N/A;  . COLONOSCOPY    . ERCP N/A 01/05/2015   Procedure: ENDOSCOPIC RETROGRADE CHOLANGIOPANCREATOGRAPHY (ERCP);  Surgeon: Hulen Luster, MD;  Location[facility];  Service: Endoscopy;  Laterality: N/A;  . ERCP N/A 02/19/2015   Procedure: ENDOSCOPIC RETROGRADE CHOLANGIOPANCREATOGRAPHY (ERCP);  Surgeon: Hulen Luster, MD;  Location: [facility] ENDOSCOPY;  Service: Gastroenterology;  Laterality: N/A;  . GALLBLADDER SURGERY  11/2014  . NEPHRECTOMY  1990  . OMENTECTOMY  12/22/2014   Procedure: OMENTECTOMY;  Surgeon: Christene Lye, MD;  Location: [facility] ORS;  Service: General;;  Partial omentectomy  . STENT REMOVAL    . UMBILICAL HERNIA REPAIR N/A 12/22/2014   Procedure: HERNIA REPAIR UMBILICAL ADULT;  Surgeon: Christene Lye, MD;  Location: [facility] ORS;  Service: General;  Laterality: N/A;   Family History  Problem Relation Age of Onset  . Hypertension Mother    Social  History   Socioeconomic History  . Marital status: Married    Spouse name: Not on file  . Number of children: 1  . Years of education: Not on file  . Highest education level: 7th grade  Occupational History  . Occupation: retired  Tobacco Use  . Smoking status: Former Smoker    Packs/day: 1.00    Years: 50.00    Pack years: 50.00    Types: Cigarettes    Quit date: 12/04/2003    Years since quitting: 16.7  . Smokeless tobacco: Current User    Types: Chew  . Tobacco comment: Has been chewing tobacco since quitting smoking in 2005.  Substance and Sexual Activity  . Alcohol use: No  . Drug use: No  . Sexual activity: Not on file  Other Topics Concern  . Not on file  Social History Narrative  . Not on file   Social Determinants of Health   Financial Resource Strain: Low Risk   . Difficulty of Paying Living Expenses: Not hard at all  Food Insecurity: No Food Insecurity  . Worried About Charity fundraiser in the Last Year: Never true  .  Ran Out of Food in the Last Year: Never true  Transportation Needs: No Transportation Needs  . Lack of Transportation (Medical): No  . Lack of Transportation (Non-Medical): No  Physical Activity: Inactive  . Days of Exercise per Week: 0 days  . Minutes of Exercise per Session: 0 min  Stress: No Stress Concern Present  . Feeling of Stress : Not at all  Social Connections: Moderately Integrated  . Frequency of Communication with Friends and Family: More than three times a week  . Frequency of Social Gatherings with Friends and Family: More than three times a week  . Attends Religious Services: More than 4 times per year  . Active Member of Clubs or Organizations: No  . Attends BankerClub or Organization Meetings: Never  . Marital Status: Married    Tobacco Counseling Ready to quit: Not Answered Counseling given: Not Answered Comment: Has been chewing tobacco since quitting smoking in 2005.   Clinical Intake:  Pre-visit preparation  completed: Yes  Pain : No/denies pain     Nutritional Risks: None Diabetes: No  How often do you need to have someone help you when you read instructions, pamphlets, or other written materials from your doctor or pharmacy?: 4 - Often (Due to being unable to see.)  Diabetic? No  Interpreter Needed?: No  Information entered by :: [facility], LPN   Activities of Daily Living In your present state of health, do you have any difficulty performing the following activities: 09/07/2020  Hearing? N  Vision? Y  Comment Unable to see - unsure cause.  Difficulty concentrating or making decisions? N  Walking or climbing stairs? Y  Comment Due to SOB from COPD.  Dressing or bathing? N  Doing errands, shopping? Y  Comment Does not drive.  Preparing Food and eating ? N  Using the Toilet? N  In the past six months, have you accidently leaked urine? N  Do you have problems with loss of bowel control? N  Managing your Medications? N  Managing your Finances? Y  Comment Daughter manages finances.  Housekeeping or managing your Housekeeping? N  Some recent data might be hidden    Patient Care Team: Erasmo DownerBacigalupo, Angela M, MD as PCP - General (Family Medicine) Salena SanerGonzalez, Carmen L, MD as Consulting Physician (Pulmonary Disease)  Indicate any recent Medical Services you may have received from other than [facility] providers in the past year (date may be approximate).     Assessment:   This is a routine wellness examination for Bobby StallionGeorge.  Hearing/Vision screen No exam data present  Dietary issues and exercise activities discussed: Current Exercise Habits: The patient does not participate in regular exercise at present, Exercise limited by: respiratory conditions(s)  Goals      Patient Stated   .  "I'm going to take care of him and I'll let you all know if we need any help" (pt-stated)      Current Barriers:  . 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    Case  Manager Clinical Goal(s):  [name]. Over the next 90 days, patient will work with care management team to address needs related to management of COPD/Chronic Respiratory Failure . Over the next 90 days, patient will not be hospitalized for complications related to chronic illnesses. . Over the next 90 days, patient will take all medications as prescribed. . Over the next 90 days, patient will adhere to provider recommendations and attend scheduled medical appointments . Over the next 90 days, patient will  follow recommended safety measures to prevent falls and injuries.  Interventions:  . Discussed plan for on going care management. Patient's spouse Bobby Peterson reports that he is doing well. She continues to assist with medications and transportation for appointments. Denied urgent concerns or changes in care management needs. Reports Bobby Peterson does not require additional assistance. Agreed to notify PCP or contact care management team if his status changes/declines and additional assistance is needed.   Patient Self Care Activities:  . Calls pharmacy for medication refills . Spouse assist with medication management.   Please see past updates related to this goal by clicking on the "Past Updates" button in the selected goal        Other   .  Advanced Care Planning      Current Barriers:  . No Advanced Directives in place  Nurse Case Manager Clinical Goal(s):  [name] Over the next 30 days, the patient will work with the care management team towards completion of advanced directives; patient/spouse decline AD conversation at this time but willing to receive telephone outreach in the future to address  Interventions:  . Provided education to patient re: Advanced Care Planning and rationale for completion  Patient Self Care Activities:  . Calls provider office for new concerns or questions . Does not adhere to provider recommendations regarding support through hospice/palliative care  Initial goal  documentation     .  DIET - INCREASE WATER INTAKE      Recommend to drink at least 6-8 8oz glasses of water per day.      Depression Screen PHQ 2/9 Scores 09/07/2020 10/01/2019 09/05/2019 06/20/2019 12/03/2018  PHQ - 2 Score 0 0 0 0 0    Fall Risk Fall Risk  09/07/2020 10/01/2019 09/05/2019 06/20/2019  Falls in the past year? 0 0 0 0  Number falls in past yr: 0 - 0 -  Injury with Fall? 0 - 0 -  Risk for fall due to : - Medication side effect - Impaired balance/gait;Medication side effect  Follow up - Falls prevention discussed - Falls prevention discussed    FALL RISK PREVENTION PERTAINING TO THE HOME:  Any stairs in or around the home? Yes  If so, are there any without handrails? No  Home free of loose throw rugs in walkways, pet beds, electrical cords, etc? Yes  Adequate lighting in your home to reduce risk of falls? Yes   ASSISTIVE DEVICES UTILIZED TO PREVENT FALLS:  Life alert? No  Use of a cane, walker or w/c? No  Grab bars in the bathroom? Yes  Shower chair or bench in shower? Yes  Elevated toilet seat or a handicapped toilet? Yes    Cognitive Function: Declined today.          Immunizations Immunization History  Administered Date(s) Administered  . PFIZER(Purple Top)SARS-COV-2 Vaccination 09/13/2019, 10/08/2019  . Pneumococcal Conjugate-13 06/02/2014, 05/10/2016  . Pneumococcal Polysaccharide-23 06/05/2010, 12/04/2011    TDAP status: Due, Education has been provided regarding the importance of this vaccine. Advised may receive this vaccine at local pharmacy or Health Dept. Aware to provide a copy of the vaccination record if obtained from local pharmacy or Health Dept. Verbalized acceptance and understanding.  Flu Vaccine status: Declined, Education has been provided regarding the importance of this vaccine but patient still declined. Advised may receive this vaccine at local pharmacy or Health Dept. Aware to provide a copy of the vaccination record if obtained from local  pharmacy or Health Dept. Verbalized acceptance and understanding.  Pneumococcal  vaccine status: Up to date  Covid-19 vaccine status: Completed vaccines  Qualifies for Shingles Vaccine? Yes   Zostavax completed No   Shingrix Completed?: No.    Education has been provided regarding the importance of this vaccine. Patient has been advised to call insurance company to determine out of pocket expense if they have not yet received this vaccine. Advised may also receive vaccine at local pharmacy or Health Dept. Verbalized acceptance and understanding.  Screening Tests Health Maintenance  Topic Date Due  . COVID-19 Vaccine (3 - Booster for Pfizer series) 04/09/2020  . INFLUENZA VACCINE  10/29/2020 (Originally 03/01/2020)  . TETANUS/TDAP  09/07/2021 (Originally 09/28/1959)  . PNA vac Low Risk Adult  Completed    Health Maintenance  Health Maintenance Due  Topic Date Due  . COVID-19 Vaccine (3 - Booster for Pfizer series) 04/09/2020    Colorectal cancer screening: No longer required.   Lung Cancer Screening: (Low Dose CT Chest recommended if Age 53-80 years, 30 pack-year currently smoking OR have quit w/in 15years.) does not qualify.   Additional Screening:  Vision Screening: Recommended annual ophthalmology exams for early detection of glaucoma and other disorders of the eye. Is the patient up to date with their annual eye exam? No Who is the provider or what is the name of the office in which the patient attends annual eye exams? No provider per patient. If pt is not established with a provider, would they like to be referred to a provider to establish care? No .   Dental Screening: Recommended annual dental exams for proper oral hygiene  Community Resource Referral / Chronic Care Management: CRR required this visit?  No   CCM required this visit?  No      Plan:     I have personally reviewed and noted the following in the patient's chart:   . Medical and social history . Use  of alcohol, tobacco or illicit drugs  . Current medications and supplements . Functional ability and status . Nutritional status . Physical activity . Advanced directives . List of other physicians . Hospitalizations, surgeries, and ER visits in previous 12 months . Vitals . Screenings to include cognitive, depression, and falls . Referrals and appointments  In addition, I have reviewed and discussed with patient certain preventive protocols, quality metrics, and best practice recommendations. A written personalized care plan for preventive services as well as general preventive health recommendations were provided to patient.     Anaja Monts [place], [place]   [id number]   Nurse Notes: Declined receiving a future flu shot. Pt states he has received his Covid booster. Requested he bring the vaccine card to next in office apt.

## 2020-09-07 ENCOUNTER — Other Ambulatory Visit: Payer: Self-pay

## 2020-09-07 ENCOUNTER — Ambulatory Visit (INDEPENDENT_AMBULATORY_CARE_PROVIDER_SITE_OTHER): Payer: Medicare HMO

## 2020-09-07 DIAGNOSIS — Z0000 Encounter for general adult medical examination without abnormal findings: Secondary | ICD-10-CM | POA: Diagnosis not present

## 2020-09-07 NOTE — Patient Instructions (Signed)
Bobby Peterson , Thank you for taking time to come for your Medicare Wellness Visit. I appreciate your ongoing commitment to your health goals. Please review the following plan we discussed and let me know if I can assist you in the future.   Screening recommendations/referrals: Colonoscopy: No longer required.  Recommended yearly ophthalmology/optometry visit for glaucoma screening and checkup Recommended yearly dental visit for hygiene and checkup  Vaccinations: Influenza vaccine: Currenlty due, declined receiving.  Pneumococcal vaccine: Completed series Tdap vaccine: Currently due, declined receiving. Shingles vaccine: Shingrix discussed. Please contact your pharmacy for coverage information.     Advanced directives: Please bring a copy of your POA (Power of Attorney) and/or Living Will to your next appointment.   Conditions/risks identified: Recommend to drink at least 6-8 8oz glasses of water per day.  Next appointment: None, declined scheduling a follow up with PCP or an AWV for 2023 at this time.  Preventive Care 56 Years and Older, Male Preventive care refers to lifestyle choices and visits with your health care provider that can promote health and wellness. What does preventive care include?  A yearly physical exam. This is also called an annual well check.  Dental exams once or twice a year.  Routine eye exams. Ask your health care provider how often you should have your eyes checked.  Personal lifestyle choices, including:  Daily care of your teeth and gums.  Regular physical activity.  Eating a healthy diet.  Avoiding tobacco and drug use.  Limiting alcohol use.  Practicing safe sex.  Taking low doses of aspirin every day.  Taking vitamin and mineral supplements as recommended by your health care provider. What happens during an annual well check? The services and screenings done by your health care provider during your annual well check will depend on your  age, overall health, lifestyle risk factors, and family history of disease. Counseling  Your health care provider may ask you questions about your:  Alcohol use.  Tobacco use.  Drug use.  Emotional well-being.  Home and relationship well-being.  Sexual activity.  Eating habits.  History of falls.  Memory and ability to understand (cognition).  Work and work Statistician. Screening  You may have the following tests or measurements:  Height, weight, and BMI.  Blood pressure.  Lipid and cholesterol levels. These may be checked every 5 years, or more frequently if you are over 13 years old.  Skin check.  Lung cancer screening. You may have this screening every year starting at age 80 if you have a 30-pack-year history of smoking and currently smoke or have quit within the past 15 years.  Fecal occult blood test (FOBT) of the stool. You may have this test every year starting at age 58.  Flexible sigmoidoscopy or colonoscopy. You may have a sigmoidoscopy every 5 years or a colonoscopy every 10 years starting at age 85.  Prostate cancer screening. Recommendations will vary depending on your family history and other risks.  Hepatitis C blood test.  Hepatitis B blood test.  Sexually transmitted disease (STD) testing.  Diabetes screening. This is done by checking your blood sugar (glucose) after you have not eaten for a while (fasting). You may have this done every 1-3 years.  Abdominal aortic aneurysm (AAA) screening. You may need this if you are a current or former smoker.  Osteoporosis. You may be screened starting at age 78 if you are at high risk. Talk with your health care provider about your test results, treatment options, and  if necessary, the need for more tests. Vaccines  Your health care provider may recommend certain vaccines, such as:  Influenza vaccine. This is recommended every year.  Tetanus, diphtheria, and acellular pertussis (Tdap, Td) vaccine. You  may need a Td booster every 10 years.  Zoster vaccine. You may need this after age 60.  Pneumococcal 13-valent conjugate (PCV13) vaccine. One dose is recommended after age 26.  Pneumococcal polysaccharide (PPSV23) vaccine. One dose is recommended after age 51. Talk to your health care provider about which screenings and vaccines you need and how often you need them. This information is not intended to replace advice given to you by your health care provider. Make sure you discuss any questions you have with your health care provider. Document Released: 08/14/2015 Document Revised: 04/06/2016 Document Reviewed: 05/19/2015 Elsevier Interactive Patient Education  2017 [place] Prevention in the Home Falls can cause injuries. They can happen to people of all ages. There are many things you can do to make your home safe and to help prevent falls. What can I do on the outside of my home?  Regularly fix the edges of walkways and driveways and fix any cracks.  Remove anything that might make you trip as you walk through a door, such as a raised step or threshold.  Trim any bushes or trees on the path to your home.  Use bright outdoor lighting.  Clear any walking paths of anything that might make someone trip, such as rocks or tools.  Regularly check to see if handrails are loose or broken. Make sure that both sides of any steps have handrails.  Any raised decks and porches should have guardrails on the edges.  Have any leaves, snow, or ice cleared regularly.  Use sand or salt on walking paths during winter.  Clean up any spills in your garage right away. This includes oil or grease spills. What can I do in the bathroom?  Use night lights.  Install grab bars by the toilet and in the tub and shower. Do not use towel bars as grab bars.  Use non-skid mats or decals in the tub or shower.  If you need to sit down in the shower, use a plastic, non-slip stool.  Keep the floor  dry. Clean up any water that spills on the floor as soon as it happens.  Remove soap buildup in the tub or shower regularly.  Attach bath mats securely with double-sided non-slip rug tape.  Do not have throw rugs and other things on the floor that can make you trip. What can I do in the bedroom?  Use night lights.  Make sure that you have a light by your bed that is easy to reach.  Do not use any sheets or blankets that are too big for your bed. They should not hang down onto the floor.  Have a firm chair that has side arms. You can use this for support while you get dressed.  Do not have throw rugs and other things on the floor that can make you trip. What can I do in the kitchen?  Clean up any spills right away.  Avoid walking on wet floors.  Keep items that you use a lot in easy-to-reach places.  If you need to reach something above you, use a strong step stool that has a grab bar.  Keep electrical cords out of the way.  Do not use floor polish or wax that makes floors slippery. If you  must use wax, use non-skid floor wax.  Do not have throw rugs and other things on the floor that can make you trip. What can I do with my stairs?  Do not leave any items on the stairs.  Make sure that there are handrails on both sides of the stairs and use them. Fix handrails that are broken or loose. Make sure that handrails are as long as the stairways.  Check any carpeting to make sure that it is firmly attached to the stairs. Fix any carpet that is loose or worn.  Avoid having throw rugs at the top or bottom of the stairs. If you do have throw rugs, attach them to the floor with carpet tape.  Make sure that you have a light switch at the top of the stairs and the bottom of the stairs. If you do not have them, ask someone to add them for you. What else can I do to help prevent falls?  Wear shoes that:  Do not have high heels.  Have rubber bottoms.  Are comfortable and fit you  well.  Are closed at the toe. Do not wear sandals.  If you use a stepladder:  Make sure that it is fully opened. Do not climb a closed stepladder.  Make sure that both sides of the stepladder are locked into place.  Ask someone to hold it for you, if possible.  Clearly mark and make sure that you can see:  Any grab bars or handrails.  First and last steps.  Where the edge of each step is.  Use tools that help you move around (mobility aids) if they are needed. These include:  Canes.  Walkers.  Scooters.  Crutches.  Turn on the lights when you go into a dark area. Replace any light bulbs as soon as they burn out.  Set up your furniture so you have a clear path. Avoid moving your furniture around.  If any of your floors are uneven, fix them.  If there are any pets around you, be aware of where they are.  Review your medicines with your doctor. Some medicines can make you feel dizzy. This can increase your chance of falling. Ask your doctor what other things that you can do to help prevent falls. This information is not intended to replace advice given to you by your health care provider. Make sure you discuss any questions you have with your health care provider. Document Released: 05/14/2009 Document Revised: 12/24/2015 Document Reviewed: 08/22/2014 Elsevier Interactive Patient Education  2017 [employer].

## 2020-09-29 DIAGNOSIS — J449 Chronic obstructive pulmonary disease, unspecified: Secondary | ICD-10-CM | POA: Diagnosis not present

## 2020-10-03 ENCOUNTER — Other Ambulatory Visit: Payer: Self-pay | Admitting: Family Medicine

## 2020-10-03 DIAGNOSIS — J449 Chronic obstructive pulmonary disease, unspecified: Secondary | ICD-10-CM

## 2020-10-03 DIAGNOSIS — I5032 Chronic diastolic (congestive) heart failure: Secondary | ICD-10-CM

## 2020-10-03 NOTE — Telephone Encounter (Signed)
Requested medication (s) are due for refill today: yes to both  Requested medication (s) are on the active medication list: yes  Last refill:  both meds:08/04/20  Future visit scheduled: no  Notes to clinic:  Macrobid: no protocol assigned to this med Lasix: overdue lab work   Requested Prescriptions  Pending Prescriptions Disp Refills   furosemide (LASIX) 40 MG tablet [Pharmacy Med Name: FUROSEMIDE 40 MG Tablet] 60 tablet     Sig: TAKE 1 TABLET EVERY DAY      Cardiovascular:  Diuretics - Loop Failed - 10/03/2020  1:23 PM      Failed - K in normal range and within 360 days    Potassium  Date Value Ref Range Status  09/06/2019 4.6 3.5 - 5.2 mmol/L Final  10/11/2013 4.6 3.5 - 5.1 mmol/L Final          Failed - Ca in normal range and within 360 days    Calcium  Date Value Ref Range Status  09/06/2019 9.2 8.6 - 10.2 mg/dL Final   Calcium, Total  Date Value Ref Range Status  10/11/2013 9.7 8.5 - 10.1 mg/dL Final          Failed - Na in normal range and within 360 days    Sodium  Date Value Ref Range Status  09/06/2019 144 134 - 144 mmol/L Final  10/11/2013 141 136 - 145 mmol/L Final          Failed - Cr in normal range and within 360 days    Creatinine  Date Value Ref Range Status  10/11/2013 2.14 (H) 0.60 - 1.30 mg/dL Final   Creatinine, Ser  Date Value Ref Range Status  09/06/2019 1.80 (H) 0.76 - 1.27 mg/dL Final          Failed - Valid encounter within last 6 months    Recent Outpatient Visits           6 months ago 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   [facility] [place], Dionne Bucy, MD   1 year ago Encounter for annual physical exam   [facility] [place], Dionne Bucy, MD   1 year ago Atherosclerosis of native coronary artery of native heart without angina pectoris   [facility], Dionne Bucy, MD   1 year ago Essential hypertension   [facility] [place],  MD   2 years ago Rectal bleeding   [facility] [place], [place]                Passed - Last BP in normal range    BP Readings from Last 1 Encounters:  09/06/19 (!) 124/58            nitrofurantoin, macrocrystal-monohydrate, (MACROBID) 100 MG capsule [Pharmacy Med Name: NITROFURANTOIN MONOHYDRATE 100 MG Capsule] 60 capsule     Sig: TAKE 1 CAPSULE AT BEDTIME      Off-Protocol Failed - 10/03/2020  1:23 PM      Failed - Medication not assigned to a protocol, review manually.      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   [facility] [place], Dionne Bucy, MD   1 year ago Encounter for annual physical exam   [facility] [place], Dionne Bucy, MD   1 year  ago Atherosclerosis of native coronary artery of native heart without angina pectoris   [facility] [place], Dionne Bucy, MD   1 year ago Essential hypertension   [place], Dionne Bucy, MD   2 years ago Rectal bleeding   [facility] [place], [place]                 Signed Prescriptions Disp Refills   ipratropium-albuterol (DUONEB) 0.5-2.5 (3) MG/3ML SOLN 360 mL 11    Sig: INHALE THE CONTENTS OF 1 VIAL VIA NEBULIZER FOUR TIMES DAILY AS NEEDED      Pulmonology:  Combination Products Passed - 10/03/2020  1:23 PM      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

## 2020-10-03 NOTE — Telephone Encounter (Signed)
Requested Prescriptions  Pending Prescriptions Disp Refills  . 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, Dionne Bucy, MD   1 year ago Encounter for annual physical exam   [facility] [place], Dionne Bucy, MD   1 year ago Atherosclerosis of native coronary artery of native heart without angina pectoris   [facility], Dionne Bucy, MD   1 year ago Essential hypertension   [place], Dionne Bucy, MD   2 years ago Rectal bleeding   [facility] [place], [place]             . furosemide (LASIX) 40 MG tablet [Pharmacy Med Name: FUROSEMIDE 40 MG Tablet] 60 tablet     Sig: TAKE 1 TABLET EVERY DAY     Cardiovascular:  Diuretics - Loop Failed - 10/03/2020  1:23 PM      Failed - K in normal range and within 360 days    Potassium  Date Value Ref Range Status  09/06/2019 4.6 3.5 - 5.2 mmol/L Final  10/11/2013 4.6 3.5 - 5.1 mmol/L Final         Failed - Ca in normal range and within 360 days    Calcium  Date Value Ref Range Status  09/06/2019 9.2 8.6 - 10.2 mg/dL Final   Calcium, Total  Date Value Ref Range Status  10/11/2013 9.7 8.5 - 10.1 mg/dL Final         Failed - Na in normal range and within 360 days    Sodium  Date Value Ref Range Status  09/06/2019 144 134 - 144 mmol/L Final  10/11/2013 141 136 - 145 mmol/L Final         Failed - Cr in normal range and within 360 days    Creatinine  Date Value Ref Range Status  10/11/2013 2.14 (H) 0.60 - 1.30  mg/dL Final   Creatinine, Ser  Date Value Ref Range Status  09/06/2019 1.80 (H) 0.76 - 1.27 mg/dL Final         Failed - Valid encounter within last 6 months    Recent Outpatient Visits          6 months ago 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 nitrofurantoin,  macrocrystal-monohydrate, (MACROBID) 100 MG capsule [Pharmacy Med Name: NITROFURANTOIN MONOHYDRATE 100 MG Capsule] 60 capsule     Sig: TAKE 1 CAPSULE AT BEDTIME     Off-Protocol Failed - 10/03/2020  1:23 PM      Failed - Medication not assigned to a protocol, review manually.      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

## 2020-10-04 IMAGING — DX DG CHEST 1V
1 series · 1 of 1 positions shown · non-contrast
Comparison: 03/15/2018

CLINICAL DATA: Worsening shortness of breath

EXAM:
CHEST  1 VIEW

[chest ap]
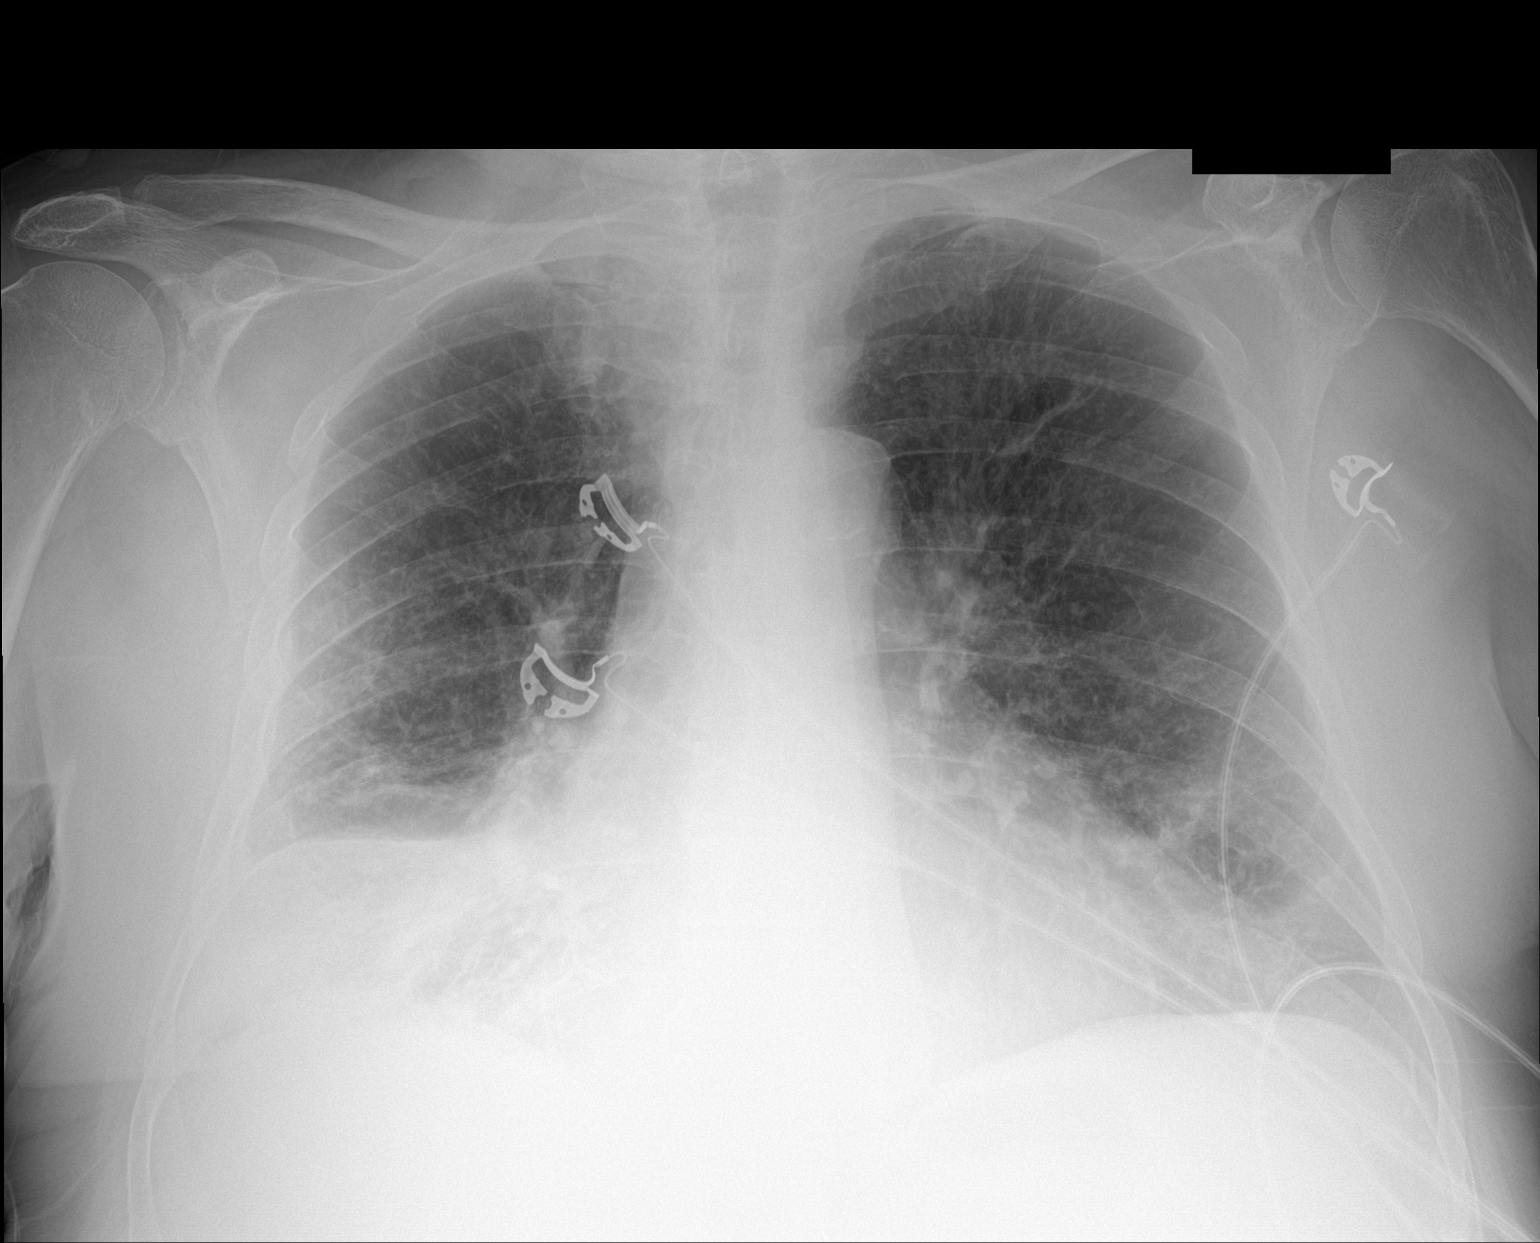

[1 of 1 positions shown; findings below may reference images not displayed]

FINDINGS: Cardiomegaly, vascular congestion. Focal right lower lobe airspace
opacity concerning for pneumonia. Atelectasis or scarring at the
left base. No effusions or acute bony abnormality.
IMPRESSION: Worsening airspace opacity at the right lung base concerning for
pneumonia.

Cardiomegaly, vascular congestion.

Left base atelectasis or scarring.

## 2020-10-06 ENCOUNTER — Other Ambulatory Visit: Payer: Self-pay | Admitting: Family Medicine

## 2020-10-06 DIAGNOSIS — E782 Mixed hyperlipidemia: Secondary | ICD-10-CM

## 2020-10-06 DIAGNOSIS — I5032 Chronic diastolic (congestive) heart failure: Secondary | ICD-10-CM

## 2020-10-06 DIAGNOSIS — R202 Paresthesia of skin: Secondary | ICD-10-CM

## 2020-10-06 MED ORDER — METOPROLOL SUCCINATE ER 50 MG PO TB24: ORAL_TABLET | ORAL | 0 refills | Status: DC

## 2020-10-06 MED ORDER — TAMSULOSIN HCL 0.4 MG PO CAPS: 0.4000 mg | ORAL_CAPSULE | Freq: Every day | ORAL | 1 refills | Status: DC

## 2020-10-06 MED ORDER — GABAPENTIN 300 MG PO CAPS: 300.0000 mg | ORAL_CAPSULE | Freq: Two times a day (BID) | ORAL | 1 refills | Status: DC

## 2020-10-06 NOTE — Telephone Encounter (Signed)
Copied from [place] [id number]. Topic: Quick Communication - Rx Refill/Question >> Oct 06, 2020  2:36 PM Lenon Curt, Everette A wrote: Medication: omeprazole (PRILOSEC) 40 MG  furosemide (LASIX) 40 MG tablet  pravastatin (PRAVACHOL) 20 MG tablet  tamsulosin (FLOMAX) 0.4 MG CAPS capsule  gabapentin (NEURONTIN) 300 MG capsule  metoprolol succinate (TOPROL-XL) 50 MG 24 hr tablet nitrofurantoin, macrocrystal-monohydrate, (MACROBID) 100 MG capsule   Has the patient contacted their pharmacy? Pharmacy has contacted patient and requested that they contact their PCP  Preferred Pharmacy (with phone number or street name): [place], [place]  Phone:  [phone]  Agent: Please be advised that RX refills may take up to 3 business days. We ask that you follow-up with your pharmacy.

## 2020-10-06 NOTE — Telephone Encounter (Signed)
Requested Prescriptions  Pending Prescriptions Disp Refills  . 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 [place], Dionne Bucy, MD   1 year ago Encounter for annual physical exam   [facility] [place], Dionne Bucy, MD   1 year ago Atherosclerosis of native coronary artery of native heart without angina pectoris   [facility], Dionne Bucy, MD   1 year ago Essential hypertension   [place], Dionne Bucy, MD   2 years ago Rectal bleeding   [facility] [place], [place]             . pravastatin (PRAVACHOL) 20 MG tablet 60 tablet 0     Cardiovascular:  Antilipid - Statins Failed - 10/06/2020  2:51 PM      Failed - Total Cholesterol in normal range and within 360 days    Cholesterol, Total  Date Value Ref Range Status  03/06/2019 105 100 - 199 mg/dL Final         Failed - LDL in normal range and within 360 days    LDL Calculated  Date Value Ref Range Status  03/06/2019 55 0 - 99 mg/dL Final         Failed - HDL in normal range and within 360 days    HDL  Date Value Ref Range Status  03/06/2019 34 (L) >39 mg/dL Final         Failed - Triglycerides in normal range and within 360 days    Triglycerides  Date Value Ref Range Status  03/06/2019 81 0 - 149 mg/dL Final         Passed - Patient is not pregnant      Passed - Valid encounter within last 12 months    Recent Outpatient Visits          6 months ago 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 furosemide (LASIX) 40 MG tablet 60 tablet 5    Sig: Take 1 tablet (40 mg total) by mouth daily.     Cardiovascular:  Diuretics - Loop Failed - 10/06/2020  2:51 PM      Failed - K in normal range and within 360 days    Potassium  Date Value Ref Range Status  09/06/2019 4.6 3.5 - 5.2 mmol/L Final  10/11/2013 4.6 3.5 - 5.1 mmol/L Final         Failed - Ca in normal range and within 360 days    Calcium  Date Value Ref Range Status  09/06/2019 9.2 8.6 - 10.2 mg/dL Final  Calcium, Total  Date Value Ref Range Status  10/11/2013 9.7 8.5 - 10.1 mg/dL Final         Failed - Na in normal range and within 360 days    Sodium  Date Value Ref Range Status  09/06/2019 144 134 - 144 mmol/L Final  10/11/2013 141 136 - 145 mmol/L Final         Failed - Cr in normal range and within 360 days    Creatinine  Date Value Ref Range Status  10/11/2013 2.14 (H) 0.60 - 1.30 mg/dL Final   Creatinine, Ser  Date Value Ref Range Status  09/06/2019 1.80 (H) 0.76 - 1.27 mg/dL Final         Failed - Valid encounter within last 6 months    Recent Outpatient Visits          6 months ago 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 tamsulosin (FLOMAX) 0.4 MG CAPS capsule 90 capsule 1    Sig: Take 1 capsule (0.4 mg total) by mouth daily.     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 gabapentin (NEURONTIN) 300 MG capsule 180 capsule 1    Sig: Take 1 capsule (300 mg total) by mouth 2 (two) times daily.     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 [place], Dionne Bucy, MD   1 year ago Atherosclerosis of native coronary  artery of native heart without angina pectoris   [facility] [place], Dionne Bucy, MD   1 year ago Essential hypertension   [place], Dionne Bucy, MD   2 years ago Rectal bleeding   [place], [place]              . metoprolol succinate (TOPROL-XL) 50 MG 24 hr tablet 180 tablet 0    Sig: Take with or immediately following a meal.     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   [facility] [place], Dionne Bucy, MD   1 year ago Encounter for annual physical exam   [facility] [place], Dionne Bucy, MD   1 year ago Atherosclerosis of native coronary artery of native heart without angina pectoris   [facility] [place], Dionne Bucy, MD   1 year ago Essential hypertension   [place], Dionne Bucy, MD   2 years ago Rectal bleeding   [place], [place]             Passed - Last BP in normal range    BP Readings from Last 1 Encounters:  09/06/19 (!) 124/58         Passed - Last Heart Rate in normal range    Pulse Readings from Last 1 Encounters:  09/06/19 90         . nitrofurantoin, macrocrystal-monohydrate, (MACROBID) 100 MG capsule 60 capsule     Sig: Take 1 capsule (100 mg total) by mouth at bedtime.     Off-Protocol Failed - 10/06/2020  2:51 PM      Failed - Medication not assigned to a protocol, review manually.      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

## 2020-10-09 ENCOUNTER — Other Ambulatory Visit: Payer: Self-pay

## 2020-10-09 MED ORDER — OMEPRAZOLE 40 MG PO CPDR: 40.0000 mg | DELAYED_RELEASE_CAPSULE | Freq: Every day | ORAL | 0 refills | Status: DC

## 2020-10-09 MED ORDER — METOPROLOL SUCCINATE ER 50 MG PO TB24: 50.0000 mg | ORAL_TABLET | Freq: Every day | ORAL | 0 refills | Status: DC

## 2020-10-09 MED ORDER — PRAVASTATIN SODIUM 20 MG PO TABS: 20.0000 mg | ORAL_TABLET | Freq: Every day | ORAL | 0 refills | Status: DC

## 2020-10-27 ENCOUNTER — Telehealth: Payer: Self-pay

## 2020-10-27 NOTE — Telephone Encounter (Signed)
Copied from [place] [id number]. Topic: General - Other >> Oct 27, 2020 11:15 AM Pawlus, Brayton Layman A wrote: Reason for CRM: Pts wife called in stating she spoke with [place] and wanted to know if Dr B would send in 270 pills of metoprolol succinate (TOPROL-XL) 50 MG 24 hr tablet, Caller stated she only received 180. Please advise.

## 2020-10-28 NOTE — Telephone Encounter (Signed)
Please advise 

## 2020-10-28 NOTE — Telephone Encounter (Signed)
That's fine if he's taking 3 pills daily

## 2020-10-29 NOTE — Telephone Encounter (Signed)
Patient is only taking one tablet daily.

## 2020-10-30 DIAGNOSIS — J449 Chronic obstructive pulmonary disease, unspecified: Secondary | ICD-10-CM | POA: Diagnosis not present

## 2020-10-30 NOTE — Telephone Encounter (Signed)
Then he only needs 90 for 3 month supply, not 270. I'm confused about request for 270

## 2020-10-30 NOTE — Telephone Encounter (Signed)
Please advise 

## 2020-11-02 ENCOUNTER — Telehealth: Payer: Self-pay | Admitting: Family Medicine

## 2020-11-02 NOTE — Telephone Encounter (Signed)
Ok. Please send in Rx for 150mg  daily #[id number]. Thanks!

## 2020-11-02 NOTE — Telephone Encounter (Signed)
Patient's wife reports that she has been giving patient 3 tablets of metoprolol every morning. Patient reports that the bottle does say to give one tablet daily, but that she does not know when or why this has been changed. She reports that he had been taking 3 50mg  tablets daily for a total of 150 mg daily. Please advise.

## 2020-11-02 NOTE — Telephone Encounter (Signed)
FU

## 2020-11-02 NOTE — Telephone Encounter (Signed)
Patient's wife is calling again regarding prescription for patient.  She needs to talk about the 90 day supply that was ordered.  She seems to think it should have been 270 day supply.  Please call to explain and speak with wife regarding script at [phone]

## 2020-11-02 NOTE — Telephone Encounter (Signed)
Pt returned call to speak with [place] please advise

## 2020-11-03 MED ORDER — METOPROLOL SUCCINATE ER 50 MG PO TB24: 150.0000 mg | ORAL_TABLET | Freq: Every day | ORAL | 2 refills | Status: DC

## 2020-11-03 NOTE — Telephone Encounter (Signed)
New prescription for metoprolol 150mg  daily sent.

## 2020-11-03 NOTE — Telephone Encounter (Signed)
Left detailed voicemail

## 2020-11-11 ENCOUNTER — Other Ambulatory Visit: Payer: Self-pay | Admitting: Family Medicine

## 2020-11-11 DIAGNOSIS — I2510 Atherosclerotic heart disease of native coronary artery without angina pectoris: Secondary | ICD-10-CM

## 2020-11-11 NOTE — Telephone Encounter (Signed)
Please advise patient insurance will not cover 3 tablets a day of the metoprolol 50mg . They will cover 1 1/2 tablets of the 100mg  which comes out to the same total daily dose. Please send in prescription for the 100mg  tablets and advise patient to only take 1 1/2 tablets a day. Also, he is overdue for follow up with dr. B and needs to schedule an appointment within the next month.

## 2020-11-12 MED ORDER — METOPROLOL SUCCINATE ER 100 MG PO TB24: 150.0000 mg | ORAL_TABLET | Freq: Every day | ORAL | 1 refills | Status: DC

## 2020-11-12 MED ORDER — METOPROLOL SUCCINATE ER 100 MG PO TB24: 150.0000 mg | ORAL_TABLET | Freq: Every day | ORAL | 0 refills | Status: DC

## 2020-11-23 IMAGING — CR DG CHEST 2V
1 series · 2 of 2 positions shown · non-contrast
Comparison: 08/30/2018

CLINICAL DATA: Follow-up right lower lobe pneumonia

EXAM:
CHEST - 2 VIEW

[Series 1: dg chest 2 view · 0.14mm/px · 2 of 2 slices shown]
[im 1/2]
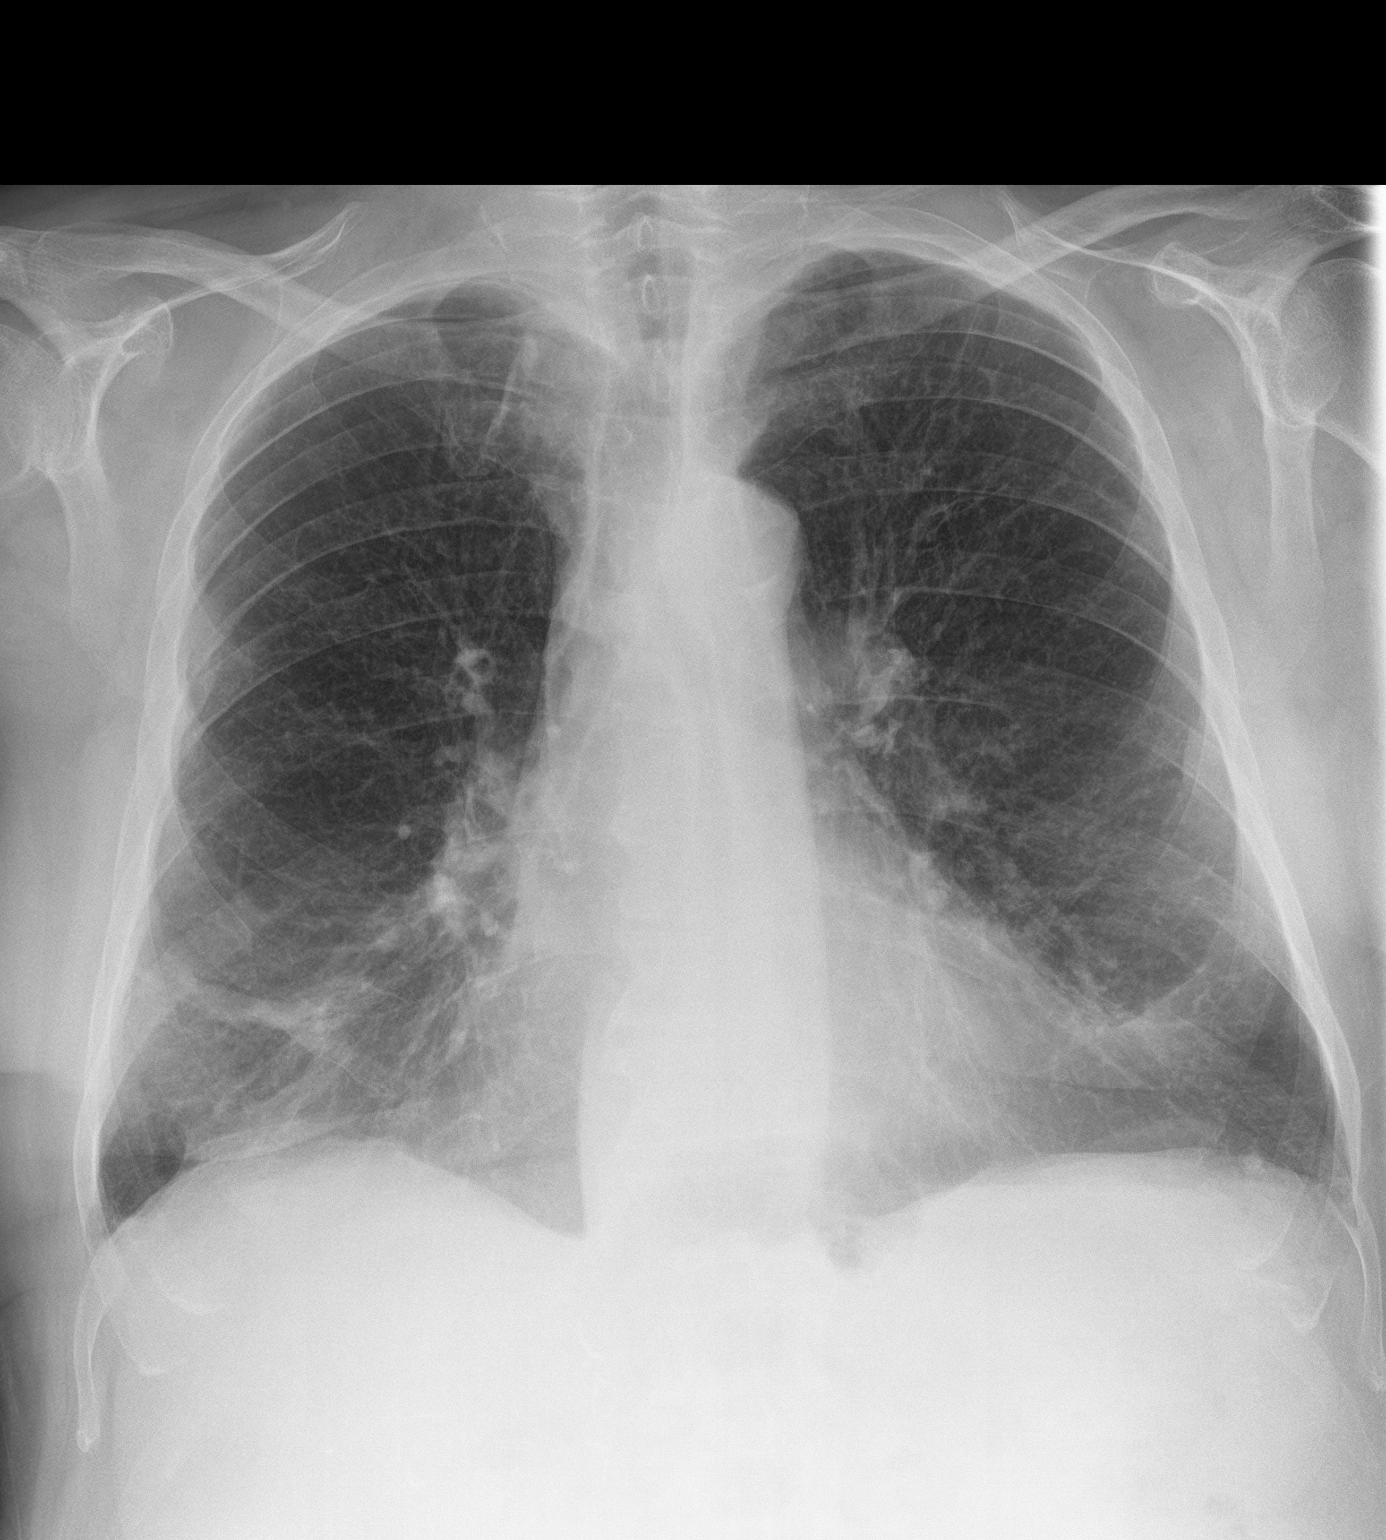
[im 2/2]
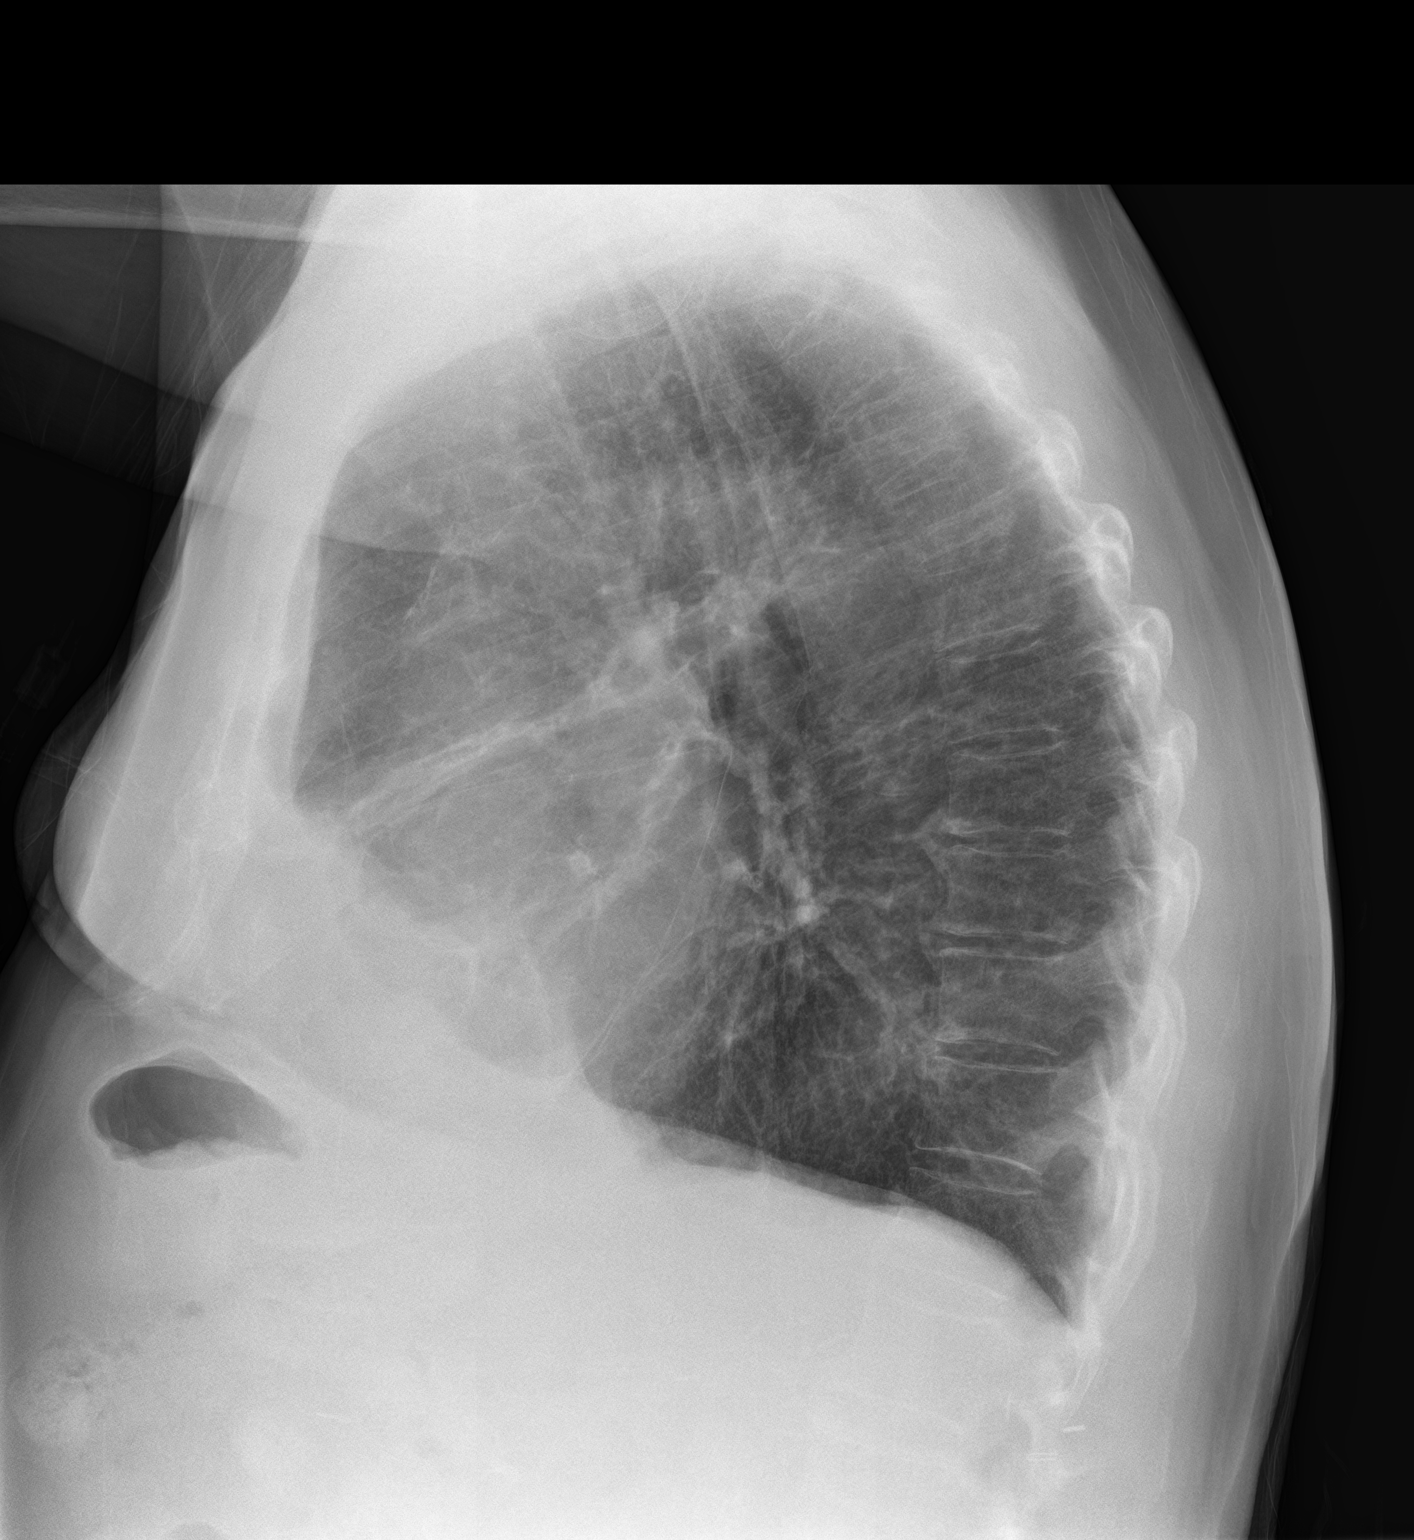

[2 of 2 positions shown; findings below may reference images not displayed]

FINDINGS: Right lower lobe pneumonia has improved. Subsegmental atelectasis at
the left base persists. Upper normal heart size. No pneumothorax.
IMPRESSION: Improved right lower lobe pneumonia.  No pneumothorax.

## 2020-11-29 DIAGNOSIS — J449 Chronic obstructive pulmonary disease, unspecified: Secondary | ICD-10-CM | POA: Diagnosis not present

## 2020-12-30 DIAGNOSIS — J449 Chronic obstructive pulmonary disease, unspecified: Secondary | ICD-10-CM | POA: Diagnosis not present

## 2021-01-13 ENCOUNTER — Other Ambulatory Visit: Payer: Self-pay | Admitting: Family Medicine

## 2021-01-13 DIAGNOSIS — E782 Mixed hyperlipidemia: Secondary | ICD-10-CM

## 2021-01-13 NOTE — Telephone Encounter (Signed)
Courtesy refill. Patient must have office visit for further refills. Requested Prescriptions  Pending Prescriptions Disp Refills  . omeprazole (PRILOSEC) 40 MG capsule [Pharmacy Med Name: OMEPRAZOLE 40 MG Capsule Delayed Release] 30 capsule 0    Sig: TAKE 1 CAPSULE DAILY. PLEASE SCHEDULE OFFICE VISIT BEFORE ANY FUTURE REFILLS.     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 pravastatin (PRAVACHOL) 20 MG tablet [Pharmacy Med Name: PRAVASTATIN SODIUM 20 MG Tablet] 30 tablet 0    Sig: TAKE 1 TABLET DAILY. PLEASE SCHEDULE OFFICE VISIT BEFORE ANY FUTURE REFILLS.     Cardiovascular:  Antilipid - Statins Failed - 01/13/2021  7:24 PM      Failed - Total Cholesterol in normal range and within 360 days    Cholesterol, Total  Date Value Ref Range Status  03/06/2019 105 100 - 199 mg/dL Final         Failed - LDL in normal range and within 360 days    LDL Calculated  Date Value Ref Range Status  03/06/2019 55 0 - 99 mg/dL Final         Failed - HDL in normal range and within 360 days    HDL  Date Value Ref Range Status  03/06/2019 34 (L) >39 mg/dL Final         Failed - Triglycerides in normal range and within 360 days    Triglycerides  Date Value Ref Range Status  03/06/2019 81 0 - 149 mg/dL Final         Passed -  Patient is not pregnant      Passed - Valid encounter within last 12 months    Recent Outpatient Visits          9 months ago 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

## 2021-01-29 DIAGNOSIS — J449 Chronic obstructive pulmonary disease, unspecified: Secondary | ICD-10-CM | POA: Diagnosis not present

## 2021-03-01 DIAGNOSIS — J449 Chronic obstructive pulmonary disease, unspecified: Secondary | ICD-10-CM | POA: Diagnosis not present

## 2021-03-08 ENCOUNTER — Telehealth: Payer: Self-pay | Admitting: Family Medicine

## 2021-03-08 DIAGNOSIS — E782 Mixed hyperlipidemia: Secondary | ICD-10-CM

## 2021-03-08 NOTE — Telephone Encounter (Signed)
Requested medication (s) are due for refill today: no  Requested medication (s) are on the active medication list:yes   Last refill:  01/16/2021  Future visit scheduled: no  Notes to clinic: Failed protocol:   Total Cholesterol in normal range and within 360 days   LDL in normal range and within 360 days   HDL in normal range and within 360 days   Triglycerides in normal range and within 360 days     Requested Prescriptions  Pending Prescriptions Disp Refills   tamsulosin (FLOMAX) 0.4 MG CAPS capsule [Pharmacy Med Name: TAMSULOSIN HYDROCHLORIDE 0.4 MG Capsule] 90 capsule 1    Sig: TAKE [place]      Urology: Alpha-Adrenergic Blocker Passed - 03/08/2021 10:44 AM      Passed - Last BP in normal range    BP Readings from Last 1 Encounters:  09/06/19 (!) 124/58          Passed - Valid encounter within last 12 months    Recent Outpatient Visits           11 months ago 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 [place], [place]                  pravastatin (PRAVACHOL) 20 MG tablet [Pharmacy Med Name: PRAVASTATIN SODIUM 20 MG Tablet] 60 tablet     Sig: TAKE 1 TABLET DAILY. PLEASE SCHEDULE OFFICE VISIT BEFORE ANY FUTURE REFILLS.      Cardiovascular:  Antilipid - Statins Failed - 03/08/2021 10:44 AM      Failed - Total Cholesterol in normal range and within 360 days    Cholesterol, Total  Date Value Ref Range Status  03/06/2019 105 100 - 199 mg/dL Final          Failed - LDL in normal range  and within 360 days    LDL Calculated  Date Value Ref Range Status  03/06/2019 55 0 - 99 mg/dL Final          Failed - HDL in normal range and within 360 days    HDL  Date Value Ref Range Status  03/06/2019 34 (L) >39 mg/dL Final          Failed - Triglycerides in normal range and within 360 days    Triglycerides  Date Value Ref Range Status  03/06/2019 81 0 - 149 mg/dL Final          Passed - Patient is not pregnant      Passed - Valid encounter within last 12 months    Recent Outpatient Visits           11 months ago 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

## 2021-03-09 ENCOUNTER — Telehealth: Payer: Self-pay | Admitting: Family Medicine

## 2021-03-09 NOTE — Telephone Encounter (Signed)
[facility] faxed refill request for the following medications:  omeprazole (PRILOSEC) 40 MG capsule   Please advise.

## 2021-03-10 NOTE — Telephone Encounter (Signed)
According to last refill on 01/13/21 patient will need office visit prior to any further refills, will reach out to patient to remind him that he is due for follow up. KW

## 2021-03-11 ENCOUNTER — Other Ambulatory Visit: Payer: Self-pay

## 2021-03-11 NOTE — Telephone Encounter (Signed)
[facility] faxed refill request for the following medications:  tamsulosin (FLOMAX) 0.4 MG CAPS capsule   omeprazole (PRILOSEC) 40 MG capsule    Please advise.

## 2021-03-15 MED ORDER — OMEPRAZOLE 40 MG PO CPDR: DELAYED_RELEASE_CAPSULE | ORAL | 0 refills | Status: DC

## 2021-03-15 MED ORDER — TAMSULOSIN HCL 0.4 MG PO CAPS: 0.4000 mg | ORAL_CAPSULE | Freq: Every day | ORAL | 0 refills | Status: DC

## 2021-03-29 ENCOUNTER — Other Ambulatory Visit: Payer: Self-pay | Admitting: Family Medicine

## 2021-03-29 DIAGNOSIS — E782 Mixed hyperlipidemia: Secondary | ICD-10-CM

## 2021-04-01 DIAGNOSIS — J449 Chronic obstructive pulmonary disease, unspecified: Secondary | ICD-10-CM | POA: Diagnosis not present

## 2021-04-02 ENCOUNTER — Encounter: Payer: Self-pay | Admitting: Family Medicine

## 2021-04-02 ENCOUNTER — Ambulatory Visit (INDEPENDENT_AMBULATORY_CARE_PROVIDER_SITE_OTHER): Payer: Medicare HMO | Admitting: Family Medicine

## 2021-04-02 VITALS — BP 135/67 | Wt 187.0 lb

## 2021-04-02 DIAGNOSIS — E785 Hyperlipidemia, unspecified: Secondary | ICD-10-CM

## 2021-04-02 DIAGNOSIS — M109 Gout, unspecified: Secondary | ICD-10-CM | POA: Diagnosis not present

## 2021-04-02 DIAGNOSIS — N400 Enlarged prostate without lower urinary tract symptoms: Secondary | ICD-10-CM

## 2021-04-02 DIAGNOSIS — N321 Vesicointestinal fistula: Secondary | ICD-10-CM | POA: Diagnosis not present

## 2021-04-02 DIAGNOSIS — J9611 Chronic respiratory failure with hypoxia: Secondary | ICD-10-CM

## 2021-04-02 DIAGNOSIS — J449 Chronic obstructive pulmonary disease, unspecified: Secondary | ICD-10-CM | POA: Diagnosis not present

## 2021-04-02 DIAGNOSIS — I10 Essential (primary) hypertension: Secondary | ICD-10-CM

## 2021-04-02 MED ORDER — PREDNISONE 20 MG PO TABS: ORAL_TABLET | ORAL | 0 refills | Status: DC

## 2021-04-02 MED ORDER — PRAVASTATIN SODIUM 20 MG PO TABS: ORAL_TABLET | ORAL | 0 refills | Status: DC

## 2021-04-02 NOTE — Progress Notes (Addendum)
Virtual telephone visit    Virtual Visit via Telephone Note   This visit type was conducted due to national recommendations for restrictions regarding the COVID-19 Pandemic (e.g. social distancing) in an effort to limit this patient's exposure and mitigate transmission in our community. Due to his co-morbid illnesses, this patient is at least at moderate risk for complications without adequate follow up. This format is felt to be most appropriate for this patient at this time. The patient did not have access to video technology or had technical difficulties with video requiring transitioning to audio format only (telephone). Physical exam was limited to content and character of the telephone converstion.    Patient location: home Provider location: office  I discussed the limitations of evaluation and management by telemedicine and the availability of in person appointments. The patient expressed understanding and agreed to proceed.   Visit Date: 04/02/2021  Today's healthcare provider: Vernie Murders, PA-C   No chief complaint on file.  Subjective    HPI  Flare of gout in knees and needs refill of medications. Was advised to get follow up appointment and labs. States he is unable to come to the office for any significant time to accomplish anything more than a blood draw for labs due to severe dyspnea and need to stay on oxygen for hypoxia.   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 on  home O2   COPD (chronic obstructive pulmonary disease) (HCC)    COPD (chronic obstructive pulmonary disease) ([place]) 12/03/2013   May fifth 2015 simple spirometry>> ratio 45%, FEV1 0.95 L (34% per day)    Coronary artery disease    Coronary atherosclerosis 07/08/2015   Dr. Ubaldo Glassing    Esophageal reflux [id number]   Hernia, umbilical    Hypercholesteremia    Hypertension    Obesity    Renal cancer ([place])    a. s/p nephrectomy in 1980's   Smoking 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 pack years 05/10/2016   Past Surgical History:  Procedure Laterality Date   CHOLECYSTECTOMY N/A 12/22/2014   Procedure: LAPAROSCOPIC CHOLECYSTECTOMY WITH INTRAOPERATIVE CHOLANGIOGRAM;  Surgeon: Christene Lye, MD;  Location: [facility] ORS;  Service: General;  Laterality: N/A;   COLONOSCOPY     ERCP N/A 01/05/2015   Procedure: ENDOSCOPIC RETROGRADE CHOLANGIOPANCREATOGRAPHY (ERCP);  Surgeon: Hulen Luster, MD;  Location[facility];  Service: Endoscopy;  Laterality: N/A;   ERCP N/A 02/19/2015   Procedure: ENDOSCOPIC RETROGRADE CHOLANGIOPANCREATOGRAPHY (ERCP);  Surgeon: Hulen Luster, MD;  Location: [facility] ENDOSCOPY;  Service: Gastroenterology;  Laterality: N/A;   GALLBLADDER SURGERY  11/2014   NEPHRECTOMY  1990   OMENTECTOMY  12/22/2014   Procedure: OMENTECTOMY;  Surgeon: Christene Lye, MD;  Location: [facility] ORS;  Service: General;;  Partial omentectomy   STENT REMOVAL     UMBILICAL HERNIA REPAIR N/A 12/22/2014   Procedure: HERNIA REPAIR UMBILICAL ADULT;  Surgeon: Christene Lye, MD;  Location: [facility] ORS;  Service: General;  Laterality: N/A;  Family History  Problem Relation Age of Onset   Hypertension Mother    Social History   Tobacco Use   Smoking status: Former    Packs/day: 1.00    Years: 50.00    Pack years: 50.00    Types: Cigarettes    Quit date: 12/04/2003    Years since quitting: 17.3   Smokeless tobacco: Current    Types: Chew   Tobacco comments:    Has been chewing tobacco since quitting smoking in 2005.   Substance Use Topics   Alcohol use: No   Drug use: No   Allergies  Allergen Reactions   Fenofibrate     Other reaction(s): Headache   Lisinopril Cough   Niacin     Other reaction(s): Dizziness   Phenergan [Promethazine Hcl]     NVD   Simvastatin     Other reaction(s): Headache    Medications: Outpatient Medications Prior to Visit  Medication Sig   albuterol (VENTOLIN HFA) 108 (90 Base) MCG/ACT inhaler Inhale into the lungs every 6 (six) hours as needed for wheezing or shortness of breath.   aspirin 81 MG tablet Take 81 mg by mouth daily.   colchicine 0.6 MG tablet Take 1.'2mg'$  once at first sign of gout flare and then 0.'6mg'$  1 hour later, then 0.'6mg'$  BID until resolution   furosemide (LASIX) 40 MG tablet TAKE 1 TABLET EVERY DAY   gabapentin (NEURONTIN) 300 MG capsule Take 1 capsule (300 mg total) by mouth 2 (two) times daily.   ipratropium-albuterol (DUONEB) 0.5-2.5 (3) MG/3ML SOLN INHALE THE CONTENTS OF 1 VIAL VIA NEBULIZER FOUR TIMES DAILY AS NEEDED   metoprolol succinate (TOPROL-XL) 100 MG 24 hr tablet Take 1.5 tablets (150 mg total) by mouth daily. Take with or immediately following a meal.   nitrofurantoin, macrocrystal-monohydrate, (MACROBID) 100 MG capsule TAKE 1 CAPSULE AT BEDTIME   omeprazole (PRILOSEC) 40 MG capsule TAKE 1 CAPSULE DAILY. PLEASE SCHEDULE OFFICE VISIT BEFORE ANY FUTURE REFILLS.   OXYGEN Inhale 2 L/min into the lungs continuous.    pravastatin (PRAVACHOL) 20 MG tablet TAKE 1 TABLET DAILY. PLEASE SCHEDULE OFFICE VISIT BEFORE ANY FUTURE REFILLS.   tamsulosin (FLOMAX) 0.4 MG CAPS capsule Take 1 capsule (0.4 mg total) by mouth daily. Please schedule office visit before any future refills.   vitamin B-12 (CYANOCOBALAMIN) 1000 MCG tablet Take 1,000 mcg by mouth once a week. *Take on Monday*   predniSONE (DELTASONE) 20 MG tablet Take '40mg'$  daily with breakfast x4 days, then '20mg'$  daily with breakfast x4 days, then '10mg'$  daily x4d, then stop (Patient not taking: Reported  on 04/02/2021)   No facility-administered medications prior to visit.    Review of Systems  Constitutional:  Negative for appetite change, chills and fever.  Respiratory:  Negative for chest tightness, shortness of breath and wheezing.   Cardiovascular:  Negative for chest pain and palpitations.  Gastrointestinal:  Negative for abdominal pain, nausea and vomiting.     Objective    BP 135/67   Wt 187 lb (84.8 kg)   SpO2 96% (on oxygen at home)  BMI 31.12 kg/m   Difficulty with telephone connection. Weak signal to hear Mr. Dutson. Wife took over the conversation and requests refills of medications.   Assessment & Plan     1. Gouty arthritis Some flare of gouty arthritis and requests refill of the prednisone. Due for fasting labs for follow up. - CBC with Differential/Platelet; Future - Comprehensive metabolic panel; Future - Uric acid; Future - predniSONE (DELTASONE) 20  MG tablet; Take '40mg'$  daily with breakfast x4 days, then '20mg'$  daily with breakfast x4 days, then '10mg'$  daily x4d, then stop  Dispense: 14 tablet; Refill: 0  2. Chronic hypoxemic respiratory failure (HCC) Presently on oxygen by nasal cannula 24 hours a day. Wife states he is unable to leave home due to persistent dyspnea. She will get family to help transport him for blood tests but take him back home immediately to continue the oxygen therapy. - CBC with Differential/Platelet; Future  3. 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  4. Colovesical fistula Take Nitrofurantoin 100 mg hs for infection prophylaxis. States he still has episodes of "peeing gas". Recheck labs next week. - CBC with Differential/Platelet; Future - Comprehensive metabolic panel; Future  5. Benign fibroma of prostate Tamsulosin 0.4 mg qd continues to help with urine flow.  6. Hyperlipidemia, unspecified hyperlipidemia type Tolerating the Pravastatin 20 mg qd. Will order follow up labs  and get enough refill locally to take until lab reports available for review. - Comprehensive metabolic panel; Future - Lipid panel; Future - pravastatin (PRAVACHOL) 20 MG tablet; TAKE 1 TABLET DAILY BY MOUTH.  Dispense: 30 tablet; Refill: 0  7. Essential hypertension Needs follow up labs. Still taking Metoprolol and Furosemide. - CBC with Differential/Platelet; Future - Comprehensive metabolic panel; Future - Lipid panel; Future      No follow-ups on file.    I discussed the assessment and treatment plan with the patient. The patient was provided an opportunity to ask questions and all were answered. The patient agreed with the plan and demonstrated an understanding of the instructions.   The patient was advised to call back or seek an in-person evaluation if the symptoms worsen or if the condition fails to improve as anticipated.  I provided 20 minutes of non-face-to-face time during this encounter.  I, Evlyn Amason, PA-C, have reviewed all documentation for this visit. The documentation on 04/02/21 for the exam, diagnosis, procedures, and orders are all accurate and complete.   Vernie Murders, PA-C [employer] [phone] (phone) [phone] (fax)  [place]

## 2021-04-06 ENCOUNTER — Other Ambulatory Visit: Payer: Self-pay

## 2021-04-06 DIAGNOSIS — I10 Essential (primary) hypertension: Secondary | ICD-10-CM

## 2021-04-06 DIAGNOSIS — E785 Hyperlipidemia, unspecified: Secondary | ICD-10-CM

## 2021-04-06 DIAGNOSIS — N321 Vesicointestinal fistula: Secondary | ICD-10-CM

## 2021-04-06 DIAGNOSIS — J9611 Chronic respiratory failure with hypoxia: Secondary | ICD-10-CM

## 2021-04-06 DIAGNOSIS — M109 Gout, unspecified: Secondary | ICD-10-CM

## 2021-04-07 ENCOUNTER — Other Ambulatory Visit: Payer: Self-pay

## 2021-04-07 ENCOUNTER — Other Ambulatory Visit: Payer: Self-pay | Admitting: Family Medicine

## 2021-04-07 LAB — COMPREHENSIVE METABOLIC PANEL
ALT: 10 IU/L (ref 0–44)
AST: 10 IU/L (ref 0–40)
Albumin/Globulin Ratio: 1.8 (ref 1.2–2.2)
Albumin: 4.2 g/dL (ref 3.6–4.6)
Alkaline Phosphatase: 99 IU/L (ref 44–121)
BUN/Creatinine Ratio: 8 — ABNORMAL LOW (ref 10–24)
BUN: 13 mg/dL (ref 8–27)
Bilirubin Total: 0.5 mg/dL (ref 0.0–1.2)
CO2: 27 mmol/L (ref 20–29)
Calcium: 9.3 mg/dL (ref 8.6–10.2)
Chloride: 101 mmol/L (ref 96–106)
Creatinine, Ser: 1.67 mg/dL — ABNORMAL HIGH (ref 0.76–1.27)
Globulin, Total: 2.4 g/dL (ref 1.5–4.5)
Glucose: 197 mg/dL — ABNORMAL HIGH (ref 65–99)
Potassium: 4.3 mmol/L (ref 3.5–5.2)
Sodium: 145 mmol/L — ABNORMAL HIGH (ref 134–144)
Total Protein: 6.6 g/dL (ref 6.0–8.5)
eGFR: 41 mL/min/{1.73_m2} — ABNORMAL LOW (ref >59)

## 2021-04-07 LAB — CBC WITH DIFFERENTIAL/PLATELET
Basophils Absolute: 0.1 10*3/uL (ref 0.0–0.2)
Basos: 0 %
EOS (ABSOLUTE): 0.3 10*3/uL (ref 0.0–0.4)
Eos: 2 %
Hematocrit: 46.5 % (ref 37.5–51.0)
Hemoglobin: 15.3 g/dL (ref 13.0–17.7)
Immature Grans (Abs): 0.1 10*3/uL (ref 0.0–0.1)
Immature Granulocytes: 1 %
Lymphocytes Absolute: 2 10*3/uL (ref 0.7–3.1)
Lymphs: 11 %
MCH: 28.9 pg (ref 26.6–33.0)
MCHC: 32.9 g/dL (ref 31.5–35.7)
MCV: 88 fL (ref 79–97)
Monocytes Absolute: 1.1 10*3/uL — ABNORMAL HIGH (ref 0.1–0.9)
Monocytes: 6 %
Neutrophils Absolute: 14.8 10*3/uL — ABNORMAL HIGH (ref 1.4–7.0)
Neutrophils: 80 %
Platelets: 263 10*3/uL (ref 150–450)
RBC: 5.3 x10E6/uL (ref 4.14–5.80)
RDW: 14.1 % (ref 11.6–15.4)
WBC: 18.4 10*3/uL — ABNORMAL HIGH (ref 3.4–10.8)

## 2021-04-07 LAB — LIPID PANEL
Chol/HDL Ratio: 4.1 ratio (ref 0.0–5.0)
Cholesterol, Total: 124 mg/dL (ref 100–199)
HDL: 30 mg/dL — ABNORMAL LOW (ref >39)
LDL Chol Calc (NIH): 68 mg/dL (ref 0–99)
Triglycerides: 149 mg/dL (ref 0–149)
VLDL Cholesterol Cal: 26 mg/dL (ref 5–40)

## 2021-04-07 LAB — URIC ACID: Uric Acid: 8 mg/dL (ref 3.8–8.4)

## 2021-04-07 MED ORDER — LEVOFLOXACIN 500 MG PO TABS: 500.0000 mg | ORAL_TABLET | Freq: Every day | ORAL | 0 refills | Status: AC

## 2021-04-07 NOTE — Telephone Encounter (Signed)
Requested Prescriptions  Pending Prescriptions Disp Refills  . tamsulosin (FLOMAX) 0.4 MG CAPS capsule [Pharmacy Med Name: TAMSULOSIN HYDROCHLORIDE 0.4 MG Capsule] 90 capsule 3    Sig: TAKE [place]     Urology: Alpha-Adrenergic Blocker Failed - 04/07/2021 11:32 AM      Failed - Valid encounter within last 12 months    Recent Outpatient Visits          5 days ago Gouty arthritis   [place], PA-C   1 year ago 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   [facility] [place], Dionne Bucy, MD   1 year ago Encounter for annual physical exam   [facility], Dionne Bucy, MD   2 years ago Atherosclerosis of native coronary artery of native heart without angina pectoris   [facility], Dionne Bucy, MD   2 years ago Essential hypertension   [place], Dionne Bucy, MD             Passed - Last BP in normal range    BP Readings from Last 1 Encounters:  04/02/21 135/67

## 2021-04-08 ENCOUNTER — Other Ambulatory Visit: Payer: Self-pay | Admitting: *Deleted

## 2021-04-08 DIAGNOSIS — R739 Hyperglycemia, unspecified: Secondary | ICD-10-CM | POA: Diagnosis not present

## 2021-04-09 LAB — HEMOGLOBIN A1C
Est. average glucose Bld gHb Est-mCnc: 212 mg/dL
Hgb A1c MFr Bld: 9 % — ABNORMAL HIGH (ref 4.8–5.6)

## 2021-04-13 ENCOUNTER — Other Ambulatory Visit: Payer: Self-pay | Admitting: Family Medicine

## 2021-04-13 DIAGNOSIS — I2510 Atherosclerotic heart disease of native coronary artery without angina pectoris: Secondary | ICD-10-CM

## 2021-04-13 DIAGNOSIS — E785 Hyperlipidemia, unspecified: Secondary | ICD-10-CM

## 2021-04-14 NOTE — Telephone Encounter (Signed)
Patient has been seen in office- to continue medications- request for 90 day supply- Rx sent to pharmacy

## 2021-04-16 ENCOUNTER — Ambulatory Visit: Payer: Self-pay | Admitting: *Deleted

## 2021-04-16 ENCOUNTER — Telehealth: Payer: Self-pay | Admitting: Family Medicine

## 2021-04-16 NOTE — Telephone Encounter (Signed)
Pt wife is calling to report her husband BS on 04-09-2021 5 pm BS 188, 9-10 -2022 730 am BS 121, 6 PM BS 168, 04-11-2021 9 AM BS 145, 630 PM BS 161, 04-12-2021 930 AM BS 160, 04-13-2021 7AM BS 156,6 pm BS 167, 04-14-2021 5 am BS 140, 545 PM BS 153, 04-15-2021 7AM BS 140, 7 PM BS 178 and 04-16-2021 BS 148 before 11 am

## 2021-04-16 NOTE — Telephone Encounter (Signed)
Reason for Disposition  Health Information question, no triage required and triager able to answer question  Answer Assessment - Initial Assessment Questions 1. REASON FOR CALL or QUESTION: "What is your reason for calling today?" or "How can I best help you?" or "What question do you have that I can help answer?"     Wife Bobby Peterson calling in.  Husband recently diagnosed with diabetes.   Was asked to keep a log of blood sugar.   This morning 148 before he ate.  She just wanted to be sure everything was ok.   Husband not having symptoms.   His blood test showed he had high sugar so they are keeping his logs.   The agent is sending in the readings.  Protocols used: Information Only Call - No Triage-A-AH

## 2021-04-16 NOTE — Telephone Encounter (Signed)
Wife Lavina Hamman called in to report his blood sugar readings as was requested during [place] last OV.   He was recently diagnosed with diabetes.   The agent sent the readings in but [name] wanted to touch base with me (nurse) to be sure the readings were ok.   She went over them with me and the highest reading was 188.   I let her know she was doing an excellent job of keeping the log of his glucose every morning and evening and to continue it.  In reading the chart of his last OV it was requested he have an appt to discuss diabetes treatment.   I made him an appt with Dr. Brita Romp for 05/31/2021 at 1:20.   I let her know I would see if he could be seen sooner.  She was agreeable to this plan.   No other questions from [place] at this point.  I will put him on the wait list for an earlier appt.  Notes sent to [facility].

## 2021-05-01 DIAGNOSIS — J449 Chronic obstructive pulmonary disease, unspecified: Secondary | ICD-10-CM | POA: Diagnosis not present

## 2021-05-15 ENCOUNTER — Other Ambulatory Visit: Payer: Self-pay | Admitting: Family Medicine

## 2021-05-15 DIAGNOSIS — R202 Paresthesia of skin: Secondary | ICD-10-CM

## 2021-05-15 NOTE — Telephone Encounter (Signed)
Requested medications are due for refill today yes  Requested medications are on the active medication list yes  Last refill 03/08/21 (mail order)  Last visit 9/2  Future visit scheduled 10/31  Notes to clinic Paresthesias not addressed in a visit within 360 days  Requested Prescriptions  Pending Prescriptions Disp Refills   gabapentin (NEURONTIN) 300 MG capsule [Pharmacy Med Name: GABAPENTIN 300 MG Capsule] 180 capsule 1    Sig: TAKE [place]     Neurology: Anticonvulsants - gabapentin Failed - 05/15/2021  1:20 AM      Failed - Valid encounter within last 12 months    Recent Outpatient Visits           1 month ago Gouty arthritis   [place], PA-C   1 year ago 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   [facility] [place], Dionne Bucy, MD   1 year ago Encounter for annual physical exam   [facility] [place], Dionne Bucy, MD   2 years ago Atherosclerosis of native coronary artery of native heart without angina pectoris   [facility], Dionne Bucy, MD   2 years ago Essential hypertension   [place], Dionne Bucy, MD       Future Appointments             In 2 weeks [place], Dionne Bucy, MD [facility] [place]

## 2021-05-17 ENCOUNTER — Other Ambulatory Visit: Payer: Self-pay

## 2021-05-17 ENCOUNTER — Telehealth: Payer: Self-pay | Admitting: Family Medicine

## 2021-05-17 MED ORDER — OMEPRAZOLE 40 MG PO CPDR: DELAYED_RELEASE_CAPSULE | ORAL | 0 refills | Status: AC

## 2021-05-17 NOTE — Telephone Encounter (Signed)
[facility] faxed refill request for the following medications:  omeprazole (PRILOSEC) 40 MG capsule  Last Rx: 04/14/21 LOV: 04/02/21 NOV: 05/31/21 Please advise. Thanks TNP

## 2021-05-19 ENCOUNTER — Telehealth: Payer: Self-pay

## 2021-05-19 NOTE — Telephone Encounter (Signed)
Copied from [place] [id number]. Topic: General - Call Back - No Documentation >> May 18, 2021 10:26 AM Scherrie Gerlach wrote: Wife [name] states she has been checking the pt's sugars as Dr B asked..  Some of the readings have been 93/120/144/125/115/120/110/116/106/121/161/145/156/167/140   highest sept 9 at 5 pm 188.  She wants to know if you think he has diabetes? She says she is tired of doing this and do you think she needs to keep checking them until his appt on 10/31?

## 2021-05-20 ENCOUNTER — Other Ambulatory Visit: Payer: Self-pay

## 2021-05-20 DIAGNOSIS — R739 Hyperglycemia, unspecified: Secondary | ICD-10-CM

## 2021-05-20 MED ORDER — METFORMIN HCL 500 MG PO TABS
500.0000 mg | ORAL_TABLET | Freq: Two times a day (BID) | ORAL | 3 refills | Status: DC
Start: 2021-05-20 — End: 2021-06-29

## 2021-05-20 NOTE — Telephone Encounter (Signed)
He does have diabetes based on A1c of 9 last month. Did Bobby Peterson not start treatment for this?  I guess he just recommended these blood sugar checks?  Stop checking blood sugars so often. Only check if he is feeling bad for now.  Start metformin 500mg  BID if agreeable and we will discuss more at upcoming visit.

## 2021-05-31 ENCOUNTER — Encounter: Payer: Self-pay | Admitting: Family Medicine

## 2021-05-31 ENCOUNTER — Other Ambulatory Visit: Payer: Self-pay

## 2021-05-31 ENCOUNTER — Ambulatory Visit (INDEPENDENT_AMBULATORY_CARE_PROVIDER_SITE_OTHER): Payer: Medicare HMO | Admitting: Family Medicine

## 2021-05-31 VITALS — BP 125/69 | HR 88 | Temp 98.0°F | Ht 65.0 in | Wt 172.6 lb

## 2021-05-31 DIAGNOSIS — E1159 Type 2 diabetes mellitus with other circulatory complications: Secondary | ICD-10-CM

## 2021-05-31 DIAGNOSIS — E119 Type 2 diabetes mellitus without complications: Secondary | ICD-10-CM | POA: Insufficient documentation

## 2021-05-31 DIAGNOSIS — E1169 Type 2 diabetes mellitus with other specified complication: Secondary | ICD-10-CM | POA: Diagnosis not present

## 2021-05-31 DIAGNOSIS — E1165 Type 2 diabetes mellitus with hyperglycemia: Secondary | ICD-10-CM

## 2021-05-31 DIAGNOSIS — I152 Hypertension secondary to endocrine disorders: Secondary | ICD-10-CM | POA: Diagnosis not present

## 2021-05-31 DIAGNOSIS — E785 Hyperlipidemia, unspecified: Secondary | ICD-10-CM

## 2021-05-31 NOTE — Assessment & Plan Note (Signed)
Previously well controlled Continue pravastatin 20mg 

## 2021-05-31 NOTE — Patient Instructions (Signed)
  Diet Recommendations for Diabetes   Starchy (carb) foods include: Bread, rice, pasta, potatoes, corn, crackers, bagels, muffins, all baked goods.  (Fruits, milk, and yogurt also have carbohydrate, but most of these foods will not spike your blood sugar as the starchy foods will.)  A few fruits do cause high blood sugars; use small portions of bananas (limit to 1/2 at a time), grapes, watermelon, and most tropical fruits.    Protein foods include: Meat, fish, poultry, eggs, dairy foods, and beans such as pinto and kidney beans (beans also provide carbohydrate).   1. Limit starchy foods to TWO per meal and ONE per snack. ONE portion of a starchy  food is equal to the following:   - ONE slice of bread (or its equivalent, such as half of a hamburger bun).   - 1/2 cup of a "scoopable" starchy food such as potatoes or rice.   - 15 grams of carbohydrate as shown on food label.  2. Include at every meal: a protein food, a carb food, and vegetables and/or fruit.   - Obtain twice as many veg's as protein or carbohydrate foods for both lunch and dinner.   - Fresh or frozen veg's are best.   - Try to keep frozen veg's on hand for a quick vegetable serving.

## 2021-05-31 NOTE — Assessment & Plan Note (Addendum)
Chronic, well-controlled BP 125/69 in office Continue lasix 40mg  and metoprolol 100mg 

## 2021-05-31 NOTE — Progress Notes (Signed)
Established patient visit   Patient: Bobby Peterson   DOB: Nov 09, 1940   80 y.o. Male  MRN: [id number] Visit Date: 05/31/2021  Today's healthcare provider: Lavon Paganini, MD   Chief Complaint  Patient presents with   Diabetes   Subjective    Glucose in 105-110s according to home blood sugar log after starting metformin, improved from before Discussed importance of sugar management to prevent complications of diabetes Education on diabetes diet   Diabetes He presents for his follow-up diabetic visit. He has type 2 diabetes mellitus. His disease course has been improving. Associated symptoms include polyuria. Symptoms are improving. Risk factors for coronary artery disease include diabetes mellitus, hypertension, tobacco exposure and male sex. Current diabetic treatment includes oral agent (monotherapy) and diet.       Medications: Outpatient Medications Prior to Visit  Medication Sig   albuterol (VENTOLIN HFA) 108 (90 Base) MCG/ACT inhaler Inhale into the lungs every 6 (six) hours as needed for wheezing or shortness of breath.   aspirin 81 MG tablet Take 81 mg by mouth daily.   furosemide (LASIX) 40 MG tablet TAKE 1 TABLET EVERY DAY   gabapentin (NEURONTIN) 300 MG capsule TAKE 1 CAPSULE TWICE DAILY   ipratropium-albuterol (DUONEB) 0.5-2.5 (3) MG/3ML SOLN INHALE THE CONTENTS OF 1 VIAL VIA NEBULIZER FOUR TIMES DAILY AS NEEDED   metFORMIN (GLUCOPHAGE) 500 MG tablet Take 1 tablet (500 mg total) by mouth 2 (two) times daily with a meal.   metoprolol succinate (TOPROL-XL) 100 MG 24 hr tablet TAKE 1 AND 1/2 TABLETS DAILY. TAKE WITH OR IMMEDIATELY FOLLOWING A MEAL.   nitrofurantoin, macrocrystal-monohydrate, (MACROBID) 100 MG capsule TAKE 1 CAPSULE AT BEDTIME   omeprazole (PRILOSEC) 40 MG capsule TAKE 1 CAPSULE DAILY   OXYGEN Inhale 2 L/min into the lungs continuous.    pravastatin (PRAVACHOL) 20 MG tablet TAKE 1 TABLET DAILY. PLEASE SCHEDULE OFFICE VISIT BEFORE ANY FUTURE  REFILLS.   predniSONE (DELTASONE) 20 MG tablet Take 40mg  daily with breakfast x4 days, then 20mg  daily with breakfast x4 days, then 10mg  daily x4d, then stop   tamsulosin (FLOMAX) 0.4 MG CAPS capsule TAKE 1 CAPSULE EVERY DAY   vitamin B-12 (CYANOCOBALAMIN) 1000 MCG tablet Take 1,000 mcg by mouth once a week. *Take on Monday*   No facility-administered medications prior to visit.    Review of Systems  Constitutional:  Negative for appetite change.  HENT:  Positive for hearing loss.   Eyes:  Positive for visual disturbance.  Gastrointestinal:  Positive for diarrhea.  Endocrine: Positive for polyuria.  All other systems reviewed and are negative.     Objective    BP 125/69 (BP Location: Left Arm, Patient Position: Sitting, Cuff Size: Normal)   Pulse 88   Temp 98 F (36.7 C) (Oral)   Ht 5\' 5"  (1.651 m)   Wt 172 lb 9.6 oz (78.3 kg)   SpO2 98%   BMI 28.72 kg/m  {Show previous vital signs (optional):23777}  Physical Exam Vitals reviewed.  Constitutional:      General: He is not in acute distress.    Appearance: Normal appearance. He is not ill-appearing or toxic-appearing.  HENT:     Head: Normocephalic and atraumatic.     Right Ear: External ear normal.     Left Ear: External ear normal.     Nose: Nose normal.     Mouth/Throat:     Mouth: Mucous membranes are moist.     Pharynx: Oropharynx is clear. No posterior oropharyngeal  erythema.  Eyes:     General: No scleral icterus.    Extraocular Movements: Extraocular movements intact.     Conjunctiva/sclera: Conjunctivae normal.     Pupils: Pupils are equal, round, and reactive to light.  Cardiovascular:     Rate and Rhythm: Normal rate and regular rhythm.     Pulses: Normal pulses.     Heart sounds: Normal heart sounds. No murmur heard.   No friction rub. No gallop.  Pulmonary:     Effort: Pulmonary effort is normal. No respiratory distress.     Breath sounds: Normal breath sounds. No wheezing, rhonchi or rales.   Abdominal:     General: There is no distension.  Musculoskeletal:        General: Normal range of motion.     Cervical back: Normal range of motion and neck supple.  Skin:    General: Skin is warm and dry.     Coloration: Skin is not jaundiced.     Findings: No rash.     Comments: [place] on LUQ  Neurological:     General: No focal deficit present.     Mental Status: He is alert and oriented to person, place, and time.  Psychiatric:        Mood and Affect: Mood normal.      No results found for any visits on 05/31/21.  Assessment & Plan     Problem List Items Addressed This Visit       Cardiovascular and Mediastinum   Hypertension associated with diabetes ([place])    Chronic, well-controlled BP 125/69 in office Continue lasix 40mg  and metoprolol 100mg         Endocrine   Hyperlipidemia associated with type 2 diabetes mellitus ([place])    Previously well controlled Continue pravastatin 20mg       Type 2 diabetes mellitus with hyperglycemia, without long-term current use of insulin (HCC) - Primary    Chronic, improving Provided education on diabetes diet Referral to diabetes education Continue metformin 500mg  twice daily F/u in 2 months for HgbA1C      Relevant Orders   Ambulatory referral to diabetic education     Return in about 2 months (around 07/31/2021) for chronic disease f/u.     Nelva Nay, Medical Student 05/31/2021, 3:16 PM  Patient seen along with MS3 student Nelva Nay. I personally evaluated this patient along with the student, and verified all aspects of the history, physical exam, and medical decision making as documented by the student. I agree with the student's documentation and have made all necessary edits.  Delance Weide, Dionne Bucy, MD, MPH [facility]

## 2021-05-31 NOTE — Assessment & Plan Note (Signed)
Chronic, improving Provided education on diabetes diet Referral to diabetes education Continue metformin 500mg  twice daily F/u in 2 months for HgbA1C

## 2021-06-01 DIAGNOSIS — J449 Chronic obstructive pulmonary disease, unspecified: Secondary | ICD-10-CM | POA: Diagnosis not present

## 2021-06-16 ENCOUNTER — Ambulatory Visit: Payer: Self-pay | Admitting: *Deleted

## 2021-06-16 NOTE — Telephone Encounter (Signed)
Called with urinary frequency for 2 days along with mild diarrhea when he voids. Does have pain with voiding today. Reported he had a tomato seed come out the tip of his penis 2 days ago while voiding. "I have a hole on my insides" and I take Macrobid everyday-takes as prescribes. Patient denies having fever and pink-tinged urine/bloody urine. 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  Advised needed to come in for appointment. He refused saying with the urine frequency and the little stools coming with it he could not come in. He offered to have a urine sample dropped off but would not accept appointment and requested antibiotic be sent to his pharmacy. Routing to physician for further advice/review.       Reason for Disposition  All other males with painful urination  Answer Assessment - Initial Assessment Questions 1. SYMPTOM: "What's the main symptom you're concerned about?" (e.g., frequency, incontinence)     Urinary frequency 2. ONSET: "When did the frequency  start?"     Monday 3. PAIN: "Is there any pain?" If Yes, ask: "How bad is it?" (Scale: 1-10; mild, moderate, severe)     enough 4. CAUSE: "What do you think is causing the symptoms?"     Unknown but says he had a tomato seed come out the penis while urinating on Monday 5. OTHER SYMPTOMS: "Do you have any other symptoms?" (e.g., fever, flank pain, blood in urine, pain with urination)     Pain with urination 6. PREGNANCY: "Is there any chance you are pregnant?" "When was your last menstrual period?"     na  Protocols used: Urinary Symptoms-A-AH, Urination Pain - Male-A-AH

## 2021-06-16 NOTE — Telephone Encounter (Signed)
Tammy patient daughter called in on patient behalf. Patient experiencing urgency but not urinating for a few days, patient experiencing diarrhea and the metformin might be the cause of the diarrhea. Spouse unsure if she would take him to urgent care. 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 No answer, left message on voicemail to call clinic back [phone].

## 2021-06-17 DIAGNOSIS — R300 Dysuria: Secondary | ICD-10-CM | POA: Diagnosis not present

## 2021-06-17 DIAGNOSIS — N390 Urinary tract infection, site not specified: Secondary | ICD-10-CM | POA: Diagnosis not present

## 2021-06-17 NOTE — Telephone Encounter (Signed)
Patient reports he still has the burning with urination. He refused appointment and stated is waiting on his daughter to call him back because she was calling the provider because he needs antibiotic send in.

## 2021-06-17 NOTE — Telephone Encounter (Signed)
LMTCB-if patient agreed to phone visit-please schedule him with Mikey Kirschner. Thanks.

## 2021-06-18 ENCOUNTER — Other Ambulatory Visit: Payer: Self-pay | Admitting: Physician Assistant

## 2021-06-18 NOTE — Progress Notes (Signed)
Spoke to [place] and his wife regarding his recent urinary symptoms.  Today he states the dysuria is improved, he is urinating more frequently but is also drinking more water. He denies hematuria. States the "tomato seed" episode has not happened again. Feels overall better.  He states that he went to [facility] urgent [facility] yesterday, they tested his urine and said it was negative, but that he has a chest infection and was put on Levaquin for a week.   His daughter Lynelle Smoke has also been in touch and concerned, she states she heard he did have a UTI and that was what the antibiotics were for.   We will try to get a copy of the UC notes to see results of the urine dip and determine next follow up care.  For now, patient feels comfortable and will continue abx. He is aware to call if he sees hematuria, if dysuria increases, if fevers/chills, etc .

## 2021-06-18 NOTE — Telephone Encounter (Signed)
Hope he scheduled the appt with you.  Here is an email I received from his daughter:  Hi Dr. Jacinto Reap, You called me once about my Dad a couple yrs ago after I emailed you a video of his ankle (gout flare). He had me call in today for him. The nurse called him. I'm not sure what all his Wife told the nurse but I want to make extra sure you know what's going on. He was up most of the night last night with the urge to pee and he's not peeing a whole lot at a time. He is drinking a lot of water already. He said one of the Drs told him he had a kidney infection about 6-8 weeks ago. He was supposedly put on an antibiotic for a week and told not to take the nitrofurantoin while he was taking that. Then after that was gone to start taking the nitro again. 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 It must be getting bigger. He spoke with a surgeon about that several years ago and the surgeon told him he could try to repair it but he thought he wouldnt make it off the table. In other words he thought he would die in surgery.  He's had a lot of diarrhea too, but I had assumed it was from the metformin he started but its been weeks he's been on that. And it hasnt gone away and it happens when he pee's.  He said its real bad and he messes on himself, thats why he doesnt feel like he would be able to come in to see you. He's afraid he would poop on himself. :(  He came in for a check up recently and I had fig someone would recheck his urine to make sure it was gone but they didnt and no blood work was done at that time. He's got worse since then. Please check in on him.  I left my number too when I called in. Its still the same you called a couple years ago when we talked before, [phone] Im his only blood child. He has 3 other  step daughters by his wife but Im his actual daughter. Call me anytime to check in or talk to me about things. They both are so stubborn and try to put things off until its bad. I told him get it handled now before he ends up in the hospital again. Thanks Dr B.   Sincerely, Mrs. Amada Kingfisher

## 2021-06-21 ENCOUNTER — Ambulatory Visit: Payer: Self-pay | Admitting: *Deleted

## 2021-06-21 NOTE — Telephone Encounter (Signed)
       Reason for Disposition  [1] SEVERE diarrhea (e.g., 7 or more times / day more than normal) AND [2] present > 24 hours (1 day)  Answer Assessment - Initial Assessment Questions 1. DIARRHEA SEVERITY: "How bad is the diarrhea?" "How many more stools have you had in the past 24 hours than normal?"    - NO DIARRHEA (SCALE 0)   - MILD (SCALE 1-3): Few loose or mushy BMs; increase of 1-3 stools over normal daily number of stools; mild increase in ostomy output.   -  MODERATE (SCALE 4-7): Increase of 4-6 stools daily over normal; moderate increase in ostomy output. * SEVERE (SCALE 8-10; OR 'WORST POSSIBLE'): Increase of 7 or more stools daily over normal; moderate increase in ostomy output; incontinence.      2. ONSET: "When did the diarrhea begin?"       3. BM CONSISTENCY: "How loose or watery is the diarrhea?"      Loose 4. VOMITING: "Are you also vomiting?" If Yes, ask: "How many times in the past 24 hours?"      no 5. ABDOMINAL PAIN: "Are you having any abdominal pain?" If Yes, ask: "What does it feel like?" (e.g., crampy, dull, intermittent, constant)      Little cramping above waist line 6. ABDOMINAL PAIN SEVERITY: If present, ask: "How bad is the pain?"  (e.g., Scale 1-10; mild, moderate, or severe)   - MILD (1-3): doesn't interfere with normal activities, abdomen soft and not tender to touch    - MODERATE (4-7): interferes with normal activities or awakens from sleep, abdomen tender to touch    - SEVERE (8-10): excruciating pain, doubled over, unable to do any normal activities       *No Answer* 7. ORAL INTAKE: If vomiting, "Have you been able to drink liquids?" "How much liquids have you had in the past 24 hours?"     NA 8. HYDRATION: "Any signs of dehydration?" (e.g., dry mouth [not just dry lips], too weak to stand, dizziness, new weight loss) "When did you last urinate?"     Yes 9. EXPOSURE: "Have you traveled to a foreign country recently?" "Have you been exposed to anyone  with diarrhea?" "Could you have eaten any food that was spoiled?"     *No Answer* 10. ANTIBIOTIC USE: "Are you taking antibiotics now or have you taken antibiotics in the past 2 months?" Yes   11. OTHER SYMPTOMS: "Do you have any other symptoms?" (e.g., fever, blood in stool)       No  Protocols used: Diarrhea-A-AH

## 2021-06-21 NOTE — Telephone Encounter (Signed)
Please Review

## 2021-06-21 NOTE — Telephone Encounter (Signed)
Pt's daughter Lynelle Smoke calling, pt on line. Tammy on DPR. Reports pt with diarrhea, "Started 1 week after starting Metformin. Med started 05/20/21. States multiple times throughout day, "Every time I urinate I go both ways." Reports mild abdominal cramping.Also has had episodes of bowel incontinence. States is staying hydrated. Requesting to come off Metformin. States was on Glipizide years ago and he did fine with that med. States "His BS was running in the 120's with watching his diet so not sure why he had to start this anyway."  Pt last took dose this AM. Pt is on antibiotic for UTI, went to [facility]  06/16/21. Assured NT would route to practice for review.   Please advise:  Would like CB to wife [phone]

## 2021-06-21 NOTE — Telephone Encounter (Signed)
Patient advised as below. Advised to check fasting blood sugars and call back if sugars are elevated in the next week or two.

## 2021-06-25 ENCOUNTER — Other Ambulatory Visit: Payer: Self-pay | Admitting: Family Medicine

## 2021-06-25 ENCOUNTER — Ambulatory Visit: Payer: Self-pay

## 2021-06-25 DIAGNOSIS — U071 COVID-19: Secondary | ICD-10-CM | POA: Diagnosis not present

## 2021-06-25 DIAGNOSIS — Z20822 Contact with and (suspected) exposure to covid-19: Secondary | ICD-10-CM | POA: Diagnosis not present

## 2021-06-25 NOTE — Telephone Encounter (Deleted)
Reason for Disposition . MILD difficulty breathing (e.g., minimal/no SOB at rest, SOB with walking, pulse <100)  Answer Assessment - Initial Assessment Questions 1. COVID-19 DIAGNOSIS: "Who made your COVID-19 diagnosis?" "Was it confirmed by a positive lab test or self-test?" If not diagnosed by a doctor (or NP/PA), ask "Are there lots of cases (community spread) where you live?" Note: See public health department website, if unsure.     *** 2. COVID-19 EXPOSURE: "Was there any known exposure to COVID before the symptoms began?" CDC Definition of close contact: within 6 feet (2 meters) for a total of 15 minutes or more over a 24-hour period.      *** 3. ONSET: "When did the COVID-19 symptoms start?"      *** 4. WORST SYMPTOM: "What is your worst symptom?" (e.g., cough, fever, shortness of breath, muscle aches)     *** 5. COUGH: "Do you have a cough?" If Yes, ask: "How bad is the cough?"       *** 6. FEVER: "Do you have a fever?" If Yes, ask: "What is your temperature, how was it measured, and when did it start?"     *** 7. RESPIRATORY STATUS: "Describe your breathing?" (e.g., shortness of breath, wheezing, unable to speak)      *** 8. BETTER-SAME-WORSE: "Are you getting better, staying the same or getting worse compared to yesterday?"  If getting worse, ask, "In what way?"     *** 9. HIGH RISK DISEASE: "Do you have any chronic medical problems?" (e.g., asthma, heart or lung disease, weak immune system, obesity, etc.)     *** 10. VACCINE: "Have you had the COVID-19 vaccine?" If Yes, ask: "Which one, how many shots, when did you get it?"       *** 11. BOOSTER: "Have you received your COVID-19 booster?" If Yes, ask: "Which one and when did you get it?"       *** 12. PREGNANCY: "Is there any chance you are pregnant?" "When was your last menstrual period?"       *** 13. OTHER SYMPTOMS: "Do you have any other symptoms?"  (e.g., chills, fatigue, headache, loss of smell or taste, muscle pain,  sore throat)       *** 14. O2 SATURATION MONITOR:  "Do you use an oxygen saturation monitor (pulse oximeter) at home?" If Yes, ask "What is your reading (oxygen level) today?" "What is your usual oxygen saturation reading?" (e.g., 95%)       ***  Protocols used: Coronavirus (COVID-19) Diagnosed or Suspected-A-AH

## 2021-06-25 NOTE — Telephone Encounter (Addendum)
Pt.'s daughter, Lynelle Smoke, reports pt. Has tested positive for COVID 19 Wednesday. Has cough, sore throat. O2 saturation 90-90 %. Uses oxygen at home. Daughter asking about Paxlovid prescription. Attempted to call on call doctor. No answer. Daughter will try a virtual visit with UC or do virtual visit through [email or web address] Instructed to go to ED for worsening of symptoms.    Answer Assessment - Initial Assessment Questions 1. COVID-19 DIAGNOSIS: "Who made your COVID-19 diagnosis?" "Was it confirmed by a positive lab test or self-test?" If not diagnosed by a doctor (or NP/PA), ask "Are there lots of cases (community spread) where you live?" Note: See public health department website, if unsure.     Home  2. COVID-19 EXPOSURE: "Was there any known exposure to COVID before the symptoms began?" CDC Definition of close contact: within 6 feet (2 meters) for a total of 15 minutes or more over a 24-hour period.      No 3. ONSET: "When did the COVID-19 symptoms start?"      Wednesday 4. WORST SYMPTOM: "What is your worst symptom?" (e.g., cough, fever, shortness of breath, muscle aches)     Cough, sore throat 5. COUGH: "Do you have a cough?" If Yes, ask: "How bad is the cough?"       Yes 6. FEVER: "Do you have a fever?" If Yes, ask: "What is your temperature, how was it measured, and when did it start?"     No 7. RESPIRATORY STATUS: "Describe your breathing?" (e.g., shortness of breath, wheezing, unable to speak)      No 8. BETTER-SAME-WORSE: "Are you getting better, staying the same or getting worse compared to yesterday?"  If getting worse, ask, "In what way?"     Same 9. HIGH RISK DISEASE: "Do you have any chronic medical problems?" (e.g., asthma, heart or lung disease, weak immune system, obesity, etc.)     COPD 10. VACCINE: "Have you had the COVID-19 vaccine?" If Yes, ask: "Which one, how many shots, when did you get it?"       yES 11. BOOSTER: "Have you received your COVID-19 booster?" If Yes,  ask: "Which one and when did you get it?"       Yes 12. PREGNANCY: "Is there any chance you are pregnant?" "When was your last menstrual period?"       N/a 13. OTHER SYMPTOMS: "Do you have any other symptoms?"  (e.g., chills, fatigue, headache, loss of smell or taste, muscle pain, sore throat)       Sore throat 14. O2 SATURATION MONITOR:  "Do you use an oxygen saturation monitor (pulse oximeter) at home?" If Yes, ask "What is your reading (oxygen level) today?" "What is your usual oxygen saturation reading?" (e.g., 95%)       90-91  Protocols used: Coronavirus (COVID-19) Diagnosed or Suspected-A-AH

## 2021-06-28 ENCOUNTER — Inpatient Hospital Stay
Admission: EM | Admit: 2021-06-28 | Discharge: 2021-07-03 | DRG: 177 | Disposition: A | Discharge status: Home / Self Care | Payer: Medicare HMO | Attending: Internal Medicine | Admitting: Internal Medicine

## 2021-06-28 ENCOUNTER — Encounter: Payer: Self-pay | Admitting: Radiology

## 2021-06-28 ENCOUNTER — Other Ambulatory Visit: Payer: Self-pay

## 2021-06-28 ENCOUNTER — Telehealth: Payer: Medicare HMO | Admitting: Family Medicine

## 2021-06-28 ENCOUNTER — Telehealth: Payer: Self-pay

## 2021-06-28 ENCOUNTER — Emergency Department: Payer: Medicare HMO

## 2021-06-28 DIAGNOSIS — I5032 Chronic diastolic (congestive) heart failure: Secondary | ICD-10-CM | POA: Diagnosis not present

## 2021-06-28 DIAGNOSIS — Z87891 Personal history of nicotine dependence: Secondary | ICD-10-CM

## 2021-06-28 DIAGNOSIS — J1282 Pneumonia due to coronavirus disease 2019: Secondary | ICD-10-CM | POA: Diagnosis not present

## 2021-06-28 DIAGNOSIS — U071 COVID-19: Principal | ICD-10-CM | POA: Diagnosis present

## 2021-06-28 DIAGNOSIS — Z8249 Family history of ischemic heart disease and other diseases of the circulatory system: Secondary | ICD-10-CM | POA: Diagnosis not present

## 2021-06-28 DIAGNOSIS — J441 Chronic obstructive pulmonary disease with (acute) exacerbation: Secondary | ICD-10-CM | POA: Diagnosis present

## 2021-06-28 DIAGNOSIS — A419 Sepsis, unspecified organism: Secondary | ICD-10-CM | POA: Diagnosis not present

## 2021-06-28 DIAGNOSIS — Z7984 Long term (current) use of oral hypoglycemic drugs: Secondary | ICD-10-CM

## 2021-06-28 DIAGNOSIS — N321 Vesicointestinal fistula: Secondary | ICD-10-CM | POA: Diagnosis not present

## 2021-06-28 DIAGNOSIS — J449 Chronic obstructive pulmonary disease, unspecified: Secondary | ICD-10-CM | POA: Diagnosis present

## 2021-06-28 DIAGNOSIS — K219 Gastro-esophageal reflux disease without esophagitis: Secondary | ICD-10-CM | POA: Diagnosis present

## 2021-06-28 DIAGNOSIS — Z85528 Personal history of other malignant neoplasm of kidney: Secondary | ICD-10-CM | POA: Diagnosis not present

## 2021-06-28 DIAGNOSIS — I2510 Atherosclerotic heart disease of native coronary artery without angina pectoris: Secondary | ICD-10-CM | POA: Diagnosis present

## 2021-06-28 DIAGNOSIS — Z7982 Long term (current) use of aspirin: Secondary | ICD-10-CM

## 2021-06-28 DIAGNOSIS — N183 Chronic kidney disease, stage 3 unspecified: Secondary | ICD-10-CM | POA: Diagnosis not present

## 2021-06-28 DIAGNOSIS — Z79899 Other long term (current) drug therapy: Secondary | ICD-10-CM

## 2021-06-28 DIAGNOSIS — E1122 Type 2 diabetes mellitus with diabetic chronic kidney disease: Secondary | ICD-10-CM | POA: Diagnosis present

## 2021-06-28 DIAGNOSIS — N210 Calculus in bladder: Secondary | ICD-10-CM | POA: Diagnosis not present

## 2021-06-28 DIAGNOSIS — J9601 Acute respiratory failure with hypoxia: Secondary | ICD-10-CM | POA: Diagnosis not present

## 2021-06-28 DIAGNOSIS — J9621 Acute and chronic respiratory failure with hypoxia: Secondary | ICD-10-CM | POA: Diagnosis not present

## 2021-06-28 DIAGNOSIS — Z9981 Dependence on supplemental oxygen: Secondary | ICD-10-CM | POA: Diagnosis not present

## 2021-06-28 DIAGNOSIS — R0602 Shortness of breath: Secondary | ICD-10-CM | POA: Diagnosis present

## 2021-06-28 DIAGNOSIS — I130 Hypertensive heart and chronic kidney disease with heart failure and stage 1 through stage 4 chronic kidney disease, or unspecified chronic kidney disease: Secondary | ICD-10-CM | POA: Diagnosis present

## 2021-06-28 DIAGNOSIS — J189 Pneumonia, unspecified organism: Secondary | ICD-10-CM | POA: Diagnosis not present

## 2021-06-28 DIAGNOSIS — E119 Type 2 diabetes mellitus without complications: Secondary | ICD-10-CM

## 2021-06-28 DIAGNOSIS — N3289 Other specified disorders of bladder: Secondary | ICD-10-CM | POA: Diagnosis not present

## 2021-06-28 DIAGNOSIS — E7800 Pure hypercholesterolemia, unspecified: Secondary | ICD-10-CM | POA: Diagnosis present

## 2021-06-28 DIAGNOSIS — J440 Chronic obstructive pulmonary disease with acute lower respiratory infection: Secondary | ICD-10-CM | POA: Diagnosis present

## 2021-06-28 DIAGNOSIS — Z905 Acquired absence of kidney: Secondary | ICD-10-CM

## 2021-06-28 DIAGNOSIS — R6520 Severe sepsis without septic shock: Secondary | ICD-10-CM | POA: Diagnosis not present

## 2021-06-28 DIAGNOSIS — I509 Heart failure, unspecified: Secondary | ICD-10-CM | POA: Diagnosis not present

## 2021-06-28 DIAGNOSIS — K769 Liver disease, unspecified: Secondary | ICD-10-CM | POA: Diagnosis not present

## 2021-06-28 DIAGNOSIS — I517 Cardiomegaly: Secondary | ICD-10-CM | POA: Diagnosis not present

## 2021-06-28 LAB — HEPATIC FUNCTION PANEL
ALT: 14 U/L (ref 0–44)
AST: 19 U/L (ref 15–41)
Albumin: 3.5 g/dL (ref 3.5–5.0)
Alkaline Phosphatase: 71 U/L (ref 38–126)
Bilirubin, Direct: 0.1 mg/dL (ref 0.0–0.2)
Indirect Bilirubin: 0.8 mg/dL (ref 0.3–0.9)
Total Bilirubin: 0.9 mg/dL (ref 0.3–1.2)
Total Protein: 6.9 g/dL (ref 6.5–8.1)

## 2021-06-28 LAB — CBC
HCT: 46.7 % (ref 39.0–52.0)
Hemoglobin: 14.9 g/dL (ref 13.0–17.0)
MCH: 28.7 pg (ref 26.0–34.0)
MCHC: 31.9 g/dL (ref 30.0–36.0)
MCV: 89.8 fL (ref 80.0–100.0)
Platelets: 192 10*3/uL (ref 150–400)
RBC: 5.2 MIL/uL (ref 4.22–5.81)
RDW: 15.3 % (ref 11.5–15.5)
WBC: 9.5 10*3/uL (ref 4.0–10.5)
nRBC: 0 % (ref 0.0–0.2)

## 2021-06-28 LAB — RESP PANEL BY RT-PCR (FLU A&B, COVID) ARPGX2
Influenza A by PCR: NEGATIVE
Influenza B by PCR: NEGATIVE
SARS Coronavirus 2 by RT PCR: POSITIVE — AB

## 2021-06-28 LAB — BASIC METABOLIC PANEL
Anion gap: 8 (ref 5–15)
BUN: 42 mg/dL — ABNORMAL HIGH (ref 8–23)
CO2: 27 mmol/L (ref 22–32)
Calcium: 8.6 mg/dL — ABNORMAL LOW (ref 8.9–10.3)
Chloride: 102 mmol/L (ref 98–111)
Creatinine, Ser: 2.01 mg/dL — ABNORMAL HIGH (ref 0.61–1.24)
GFR, Estimated: 33 mL/min — ABNORMAL LOW (ref >60)
Glucose, Bld: 136 mg/dL — ABNORMAL HIGH (ref 70–99)
Potassium: 3.8 mmol/L (ref 3.5–5.1)
Sodium: 137 mmol/L (ref 135–145)

## 2021-06-28 LAB — LACTIC ACID, PLASMA
Lactic Acid, Venous: 1.6 mmol/L (ref 0.5–1.9)
Lactic Acid, Venous: 1.4 mmol/L (ref 0.5–1.9)

## 2021-06-28 LAB — LIPASE, BLOOD: Lipase: 33 U/L (ref 11–51)

## 2021-06-28 LAB — PROCALCITONIN: Procalcitonin: 0.14 ng/mL

## 2021-06-28 LAB — TROPONIN I (HIGH SENSITIVITY): Troponin I (High Sensitivity): 11 ng/L (ref <18)

## 2021-06-28 MED ORDER — METHYLPREDNISOLONE SODIUM SUCC 125 MG IJ SOLR
125.0000 mg | Freq: Once | INTRAMUSCULAR | Status: AC
  Administered 2021-06-28: 19:00:00 125 mg via INTRAVENOUS
  Filled 2021-06-28: qty 2

## 2021-06-28 MED ORDER — SODIUM CHLORIDE 0.9 % IV SOLN: 1.0000 g | Freq: Once | INTRAVENOUS | Status: DC

## 2021-06-28 MED ORDER — SODIUM CHLORIDE 0.9 % IV SOLN
500.0000 mg | Freq: Once | INTRAVENOUS | Status: AC
  Administered 2021-06-28: 19:00:00 500 mg via INTRAVENOUS
  Filled 2021-06-28: qty 500

## 2021-06-28 MED ORDER — IOHEXOL 300 MG/ML  SOLN
100.0000 mL | Freq: Once | INTRAMUSCULAR | Status: AC | PRN
  Administered 2021-06-28: 20:00:00 60 mL via INTRAVENOUS

## 2021-06-28 MED ORDER — SODIUM CHLORIDE 0.9 % IV SOLN: 500.0000 mg | Freq: Once | INTRAVENOUS | Status: DC

## 2021-06-28 MED ORDER — ALBUTEROL SULFATE HFA 108 (90 BASE) MCG/ACT IN AERS
2.0000 | INHALATION_SPRAY | Freq: Once | RESPIRATORY_TRACT | Status: AC
  Administered 2021-06-28: 19:00:00 2 via RESPIRATORY_TRACT
  Filled 2021-06-28: qty 6.7

## 2021-06-28 MED ORDER — LACTATED RINGERS IV BOLUS
1000.0000 mL | Freq: Once | INTRAVENOUS | Status: AC
  Administered 2021-06-28: 19:00:00 1000 mL via INTRAVENOUS

## 2021-06-28 MED ORDER — PIPERACILLIN-TAZOBACTAM 3.375 G IVPB 30 MIN
3.3750 g | Freq: Once | INTRAVENOUS | Status: AC
  Administered 2021-06-28: 21:00:00 3.375 g via INTRAVENOUS
  Filled 2021-06-28: qty 50

## 2021-06-28 NOTE — Telephone Encounter (Signed)
FYI-patient at the ED

## 2021-06-28 NOTE — ED Notes (Signed)
Pt back from CT

## 2021-06-28 NOTE — Telephone Encounter (Signed)
Copied from [place] [id number]. Topic: Complaint - Provider (non-sensitive) >> Jun 25, 2021 11:28 AM Oneta Rack wrote: Caller wanted practice administrator to be aware [facility] web site reflects Saturday hours.

## 2021-06-28 NOTE — Telephone Encounter (Signed)
Will reach out to marketing to let them know to have it removed until we start Saturday hrs back.

## 2021-06-28 NOTE — ED Notes (Signed)
Pt bumped up to 4L [place] at this time. MD aware of decline.

## 2021-06-28 NOTE — ED Triage Notes (Signed)
Pt here with covid symtoms. Pt tested positive on Wed with a at home test. Pt has a hx of COPD and HF.

## 2021-06-28 NOTE — ED Notes (Signed)
Pt to CT

## 2021-06-28 NOTE — ED Provider Notes (Signed)
[facility] Emergency Department Provider Note   ____________________________________________   Event Date/Time   First MD Initiated Contact with Patient 06/28/21 1818     (approximate)  I have reviewed the triage vital signs and the nursing notes.   HISTORY  Chief Complaint Covid Symptoms    HPI Bobby Peterson is a 80 y.o. 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  Patient reports that he initially tested positive for COVID-19 9 days ago, has been feeling increasingly weak with cough and difficulty breathing since then.  He denies any fevers or pain in his chest, does state that he has been having off and on burning when he urinates along with difficulty emptying his bladder.  He reports some pain in the center of his lower abdomen, but denies any pain in his flanks and has not had any hematuria.  He recently completed a course of antibiotics for UTI, states pain has returned since then.  Patient wears 2 L of supplemental oxygen chronically, noted to have O2 sats in the mid 80s on this and increased to 4 L in triage.        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 on home O2   COPD (chronic obstructive pulmonary disease) (HCC)    COPD (chronic obstructive pulmonary disease) ([place]) 12/03/2013   May fifth 2015 simple spirometry>> ratio 45%,  FEV1 0.95 L (34% per day)    Coronary artery disease    Coronary atherosclerosis 07/08/2015   Dr. Ubaldo Glassing    Esophageal reflux 54/12/5679   Hernia, umbilical    Hypercholesteremia    Hypertension    Obesity    Renal cancer ([place])    a. 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   Coronary atherosclerosis 07/08/2015   Chronic diastolic heart failure ([place]) 07/08/2015   Chronic kidney disease (CKD), stage III (moderate) ([place]) 07/08/2015   Esophageal reflux 07/08/2015   Hyperlipidemia associated with type 2 diabetes mellitus ([place]) 07/08/2015   Hypertension associated with diabetes ([place]) 07/08/2015   Calculus of kidney 07/08/2015   COPD (chronic obstructive pulmonary disease) ([place]) 12/03/2013   Chronic hypoxemic respiratory failure ([place]) 12/03/2013    Past Surgical History:  Procedure Laterality Date   CHOLECYSTECTOMY N/A 12/22/2014   Procedure: LAPAROSCOPIC CHOLECYSTECTOMY WITH INTRAOPERATIVE CHOLANGIOGRAM;  Surgeon: Christene Lye, MD;  Location: [facility] ORS;  Service: General;  Laterality: N/A;  COLONOSCOPY     ERCP N/A 01/05/2015   Procedure: ENDOSCOPIC RETROGRADE CHOLANGIOPANCREATOGRAPHY (ERCP);  Surgeon: Hulen Luster, MD;  Location: [facility] ENDOSCOPY;  Service: Endoscopy;  Laterality: N/A;   ERCP N/A 02/19/2015   Procedure: ENDOSCOPIC RETROGRADE CHOLANGIOPANCREATOGRAPHY (ERCP);  Surgeon: Hulen Luster, MD;  Location: [facility] ENDOSCOPY;  Service: Gastroenterology;  Laterality: N/A;   GALLBLADDER SURGERY  11/2014   NEPHRECTOMY  1990   OMENTECTOMY   12/22/2014   Procedure: OMENTECTOMY;  Surgeon: Christene Lye, MD;  Location: [facility] ORS;  Service: General;;  Partial omentectomy   STENT REMOVAL     UMBILICAL HERNIA REPAIR N/A 12/22/2014   Procedure: HERNIA REPAIR UMBILICAL ADULT;  Surgeon: Christene Lye, MD;  Location: [facility] ORS;  Service: General;  Laterality: N/A;    Prior to Admission medications   Medication Sig Start Date End Date Taking? Authorizing Provider  albuterol (VENTOLIN HFA) 108 (90 Base) MCG/ACT inhaler Inhale into the lungs every 6 (six) hours as needed for wheezing or shortness of breath.    [provider]  aspirin 81 MG tablet Take 81 mg by mouth daily.    [provider]  furosemide (LASIX) 40 MG tablet TAKE 1 TABLET EVERY DAY 10/06/20   Bacigalupo, Dionne Bucy, MD  gabapentin (NEURONTIN) 300 MG capsule TAKE 1 CAPSULE TWICE DAILY 05/17/21   Bacigalupo, Dionne Bucy, MD  ipratropium-albuterol (DUONEB) 0.5-2.5 (3) MG/3ML SOLN INHALE THE CONTENTS OF 1 VIAL VIA NEBULIZER FOUR TIMES DAILY AS NEEDED 10/03/20   Bacigalupo, Dionne Bucy, MD  levofloxacin (LEVAQUIN) 500 MG tablet Take 500 mg by mouth daily. 06/17/21   [provider]  metFORMIN (GLUCOPHAGE) 500 MG tablet Take 1 tablet (500 mg total) by mouth 2 (two) times daily with a meal. 05/20/21   Bacigalupo, Dionne Bucy, MD  metoprolol succinate (TOPROL-XL) 100 MG 24 hr tablet TAKE 1 AND 1/2 TABLETS DAILY. TAKE WITH OR IMMEDIATELY FOLLOWING A MEAL. 04/13/21   Virginia Crews, MD  nitrofurantoin, macrocrystal-monohydrate, (MACROBID) 100 MG capsule TAKE 1 CAPSULE AT BEDTIME 10/06/20   Virginia Crews, MD  omeprazole (PRILOSEC) 40 MG capsule TAKE 1 CAPSULE DAILY 05/17/21   Virginia Crews, MD  OXYGEN Inhale 2 L/min into the lungs continuous.     [provider]  pravastatin (PRAVACHOL) 20 MG tablet TAKE 1 TABLET DAILY. PLEASE SCHEDULE OFFICE VISIT BEFORE ANY FUTURE REFILLS. 04/14/21   Chrismon, Vickki Muff, PA-C  predniSONE (DELTASONE) 20 MG  tablet Take 40mg  daily with breakfast x4 days, then 20mg  daily with breakfast x4 days, then 10mg  daily x4d, then stop 04/02/21   Chrismon, Simona Huh E, PA-C  tamsulosin (FLOMAX) 0.4 MG CAPS capsule TAKE 1 CAPSULE EVERY DAY 04/07/21   Bacigalupo, Dionne Bucy, MD  vitamin B-12 (CYANOCOBALAMIN) 1000 MCG tablet Take 1,000 mcg by mouth once a week. *Take on Monday*    [provider]    Allergies Fenofibrate, Lisinopril, Niacin, Phenergan [promethazine hcl], and Simvastatin  Family History  Problem Relation Age of Onset   Hypertension Mother     Social History Social History   Tobacco Use   Smoking status: Former    Packs/day: 1.00    Years: 50.00    Pack years: 50.00    Types: Cigarettes    Quit date: 12/04/2003    Years since quitting: 17.5   Smokeless tobacco: Current    Types: Chew   Tobacco comments:    Has been chewing tobacco since quitting smoking in 2005.  Substance Use Topics   Alcohol use:  No   Drug use: No    Review of Systems  Constitutional: No fever/chills.  Positive for generalized weakness and malaise. Eyes: No visual changes. ENT: No sore throat. Cardiovascular: Denies chest pain. Respiratory: Positive for cough and shortness of breath. Gastrointestinal: Positive for abdominal pain.  No nausea, no vomiting.  No diarrhea.  No constipation. Genitourinary: Positive for dysuria. Musculoskeletal: Negative for back pain. Skin: Negative for rash. Neurological: Negative for headaches, focal weakness or numbness.  ____________________________________________   PHYSICAL EXAM:  VITAL SIGNS: ED Triage Vitals  Enc Vitals Group     BP 06/28/21 1016 99/63     Pulse Rate 06/28/21 1016 (!) 124     Resp 06/28/21 1016 18     Temp 06/28/21 1016 98 F (36.7 C)     Temp Source 06/28/21 1016 Oral     SpO2 06/28/21 1016 93 %     Weight 06/28/21 1017 172 lb 9.9 oz (78.3 kg)     Height 06/28/21 1017 5\' 5"  (1.651 m)     Head Circumference --      Peak Flow --      Pain  Score 06/28/21 1016 10     Pain Loc --      Pain Edu? --      Excl. in [place]? --     Constitutional: Alert and oriented. Eyes: Conjunctivae are normal. Head: Atraumatic. Nose: No congestion/rhinnorhea. Mouth/Throat: Mucous membranes are dry. Neck: Normal ROM Cardiovascular: Tachycardic, regular rhythm. Grossly normal heart sounds.  2+ radial pulses bilaterally. Respiratory: Tachypneic with increased respiratory effort.  No retractions. Lungs with expiratory wheezing throughout. Gastrointestinal: Soft and tender to palpation in suprapubic area. No distention. Genitourinary: deferred Musculoskeletal: No lower extremity tenderness nor edema. Neurologic:  Normal speech and language. No gross focal neurologic deficits are appreciated. Skin:  Skin is warm, dry and intact. No rash noted. Psychiatric: Mood and affect are normal. Speech and behavior are normal.  ____________________________________________   LABS (all labs ordered are listed, but only abnormal results are displayed)  Labs Reviewed  RESP PANEL BY RT-PCR (FLU A&B, COVID) ARPGX2 - Abnormal; Notable for the following components:      Result Value   SARS Coronavirus 2 by RT PCR POSITIVE (*)    All other components within normal limits  BASIC METABOLIC PANEL - Abnormal; Notable for the following components:   Glucose, Bld 136 (*)    BUN 42 (*)    Creatinine, Ser 2.01 (*)    Calcium 8.6 (*)    GFR, Estimated 33 (*)    All other components within normal limits  CULTURE, BLOOD (ROUTINE X 2)  CULTURE, BLOOD (ROUTINE X 2)  URINE CULTURE  CBC  LACTIC ACID, PLASMA  LACTIC ACID, PLASMA  PROCALCITONIN  HEPATIC FUNCTION PANEL  LIPASE, BLOOD  URINALYSIS, ROUTINE W REFLEX MICROSCOPIC  TROPONIN I (HIGH SENSITIVITY)   ____________________________________________  EKG  ED ECG REPORT I, Blake Divine, the attending physician, personally viewed and interpreted this ECG.   Date: 06/28/2021  EKG Time: 10:13  Rate: 125   Rhythm: sinus tachycardia  Axis: Normal  Intervals:none  ST&T Change: Inferolateral ST depressions   PROCEDURES  Procedure(s) performed (including Critical Care):  .Critical Care Performed by: Blake Divine, MD Authorized by: Blake Divine, MD   Critical care provider statement:    Critical care time (minutes):  45   Critical care time was exclusive of:  Separately billable procedures and treating other patients and teaching time   Critical care was necessary  to treat or prevent imminent or life-threatening deterioration of the following conditions:  Respiratory failure   Critical care was time spent personally by me on the following activities:  Development of treatment plan with patient or surrogate, discussions with consultants, evaluation of patient's response to treatment, examination of patient, ordering and review of laboratory studies, ordering and review of radiographic studies, ordering and performing treatments and interventions, pulse oximetry, re-evaluation of patient's condition and review of old charts   I assumed direction of critical care for this patient from another provider in my specialty: no     Care discussed with: admitting provider     ____________________________________________   INITIAL IMPRESSION / ASSESSMENT AND PLAN / ED COURSE      80 year old male with past medical history of hypertension, hyperlipidemia, CAD, 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  Patient noted to be tachypneic with increased respiratory effort, significant wheezing noted on exam.  Patient did have PCR testing confirming COVID-positive today and we will need to hold off on nebulized breathing treatments, treat with albuterol inhaler and IV steroids.  Chest x-ray reviewed by me and shows no infiltrate or edema, but  given his history of COPD with purulent sputum, we will treat with Rocephin and azithromycin.  This would also cover for potential UTI, bladder scan testing for urinary retention is pending.  Labs remarkable for AKI and patient appears dehydrated, will receive IV fluid bolus.  We will also add on troponin given his diffuse ST depressions, however he denies any chest pain.  Bladder scan shows minimal urine, however urine sample that patient provided appears to contain fecal matter.  CT scan was obtained and read demonstrates significant colovesicular fistula, additionally shows what appears to be multifocal pneumonia, likely related to COVID-19.  We will cover with Zosyn and azithromycin, patient remained stable from a respiratory perspective on 4 L nasal cannula.  Troponin is within normal limits and I doubt ACS or PE.  Case discussed with hospitalist for admission.      ____________________________________________   FINAL CLINICAL IMPRESSION(S) / ED DIAGNOSES  Final diagnoses:  Acute respiratory failure with hypoxia ([place])  COVID-19  Colovesical fistula  Sepsis with acute hypoxic respiratory failure without septic shock, due to unspecified organism [facility])     ED Discharge Orders     None        Note:  This document was prepared using Dragon voice recognition software and may include unintentional dictation errors.    Blake Divine, MD 06/28/21 [phone]

## 2021-06-28 NOTE — ED Notes (Signed)
Pt resting with eyes closed, respirations even and unlabored at this time.

## 2021-06-28 NOTE — Telephone Encounter (Signed)
Noted  

## 2021-06-28 NOTE — ED Notes (Signed)
Full rainbow sent to lab.  

## 2021-06-28 NOTE — Consult Note (Signed)
CODE SEPSIS - PHARMACY COMMUNICATION  **Broad Spectrum Antibiotics should be administered within 1 hour of Sepsis diagnosis**  Time Code Sepsis Called/Page Received: 1904  Antibiotics Ordered: 1906  Time of 1st antibiotic administration: [place] ,PharmD, BCPS Clinical Pharmacist  06/28/2021  7:31 PM

## 2021-06-28 NOTE — Sepsis Progress Note (Signed)
Elink following Code Sepsis. 

## 2021-06-29 ENCOUNTER — Encounter: Payer: Self-pay | Admitting: Internal Medicine

## 2021-06-29 ENCOUNTER — Telehealth: Payer: Self-pay

## 2021-06-29 DIAGNOSIS — U071 COVID-19: Principal | ICD-10-CM

## 2021-06-29 DIAGNOSIS — N1832 Chronic kidney disease, stage 3b: Secondary | ICD-10-CM

## 2021-06-29 DIAGNOSIS — I5032 Chronic diastolic (congestive) heart failure: Secondary | ICD-10-CM

## 2021-06-29 DIAGNOSIS — J1282 Pneumonia due to coronavirus disease 2019: Secondary | ICD-10-CM

## 2021-06-29 DIAGNOSIS — J9621 Acute and chronic respiratory failure with hypoxia: Secondary | ICD-10-CM

## 2021-06-29 LAB — URINALYSIS, ROUTINE W REFLEX MICROSCOPIC
Glucose, UA: NEGATIVE mg/dL
Ketones, ur: 40 mg/dL — AB
Nitrite: NEGATIVE
Protein, ur: 30 mg/dL — AB
Specific Gravity, Urine: 1.015 (ref 1.005–1.030)
pH: 7 (ref 5.0–8.0)

## 2021-06-29 LAB — URINE CULTURE

## 2021-06-29 LAB — URINALYSIS, MICROSCOPIC (REFLEX): WBC, UA: >50 WBC/hpf (ref 0–5)

## 2021-06-29 MED ORDER — HALOPERIDOL LACTATE 5 MG/ML IJ SOLN
INTRAMUSCULAR | Status: AC
  Filled 2021-06-29: qty 1

## 2021-06-29 MED ORDER — METOPROLOL TARTRATE 50 MG PO TABS
ORAL_TABLET | ORAL | Status: AC
  Filled 2021-06-29: qty 3

## 2021-06-29 MED ORDER — HALOPERIDOL LACTATE 5 MG/ML IJ SOLN
1.0000 mg | Freq: Three times a day (TID) | INTRAMUSCULAR | Status: DC | PRN
  Administered 2021-06-29: 1 mg via INTRAVENOUS

## 2021-06-29 MED ORDER — PRAVASTATIN SODIUM 20 MG PO TABS
20.0000 mg | ORAL_TABLET | Freq: Every day | ORAL | Status: DC
  Administered 2021-06-29 – 2021-07-02 (×4): 20 mg via ORAL
  Filled 2021-06-29 (×3): qty 1

## 2021-06-29 MED ORDER — IPRATROPIUM-ALBUTEROL 0.5-2.5 (3) MG/3ML IN SOLN: 3.0000 mL | Freq: Four times a day (QID) | RESPIRATORY_TRACT | Status: DC | PRN

## 2021-06-29 MED ORDER — TAMSULOSIN HCL 0.4 MG PO CAPS
0.4000 mg | ORAL_CAPSULE | Freq: Every day | ORAL | Status: DC
  Administered 2021-06-29 – 2021-07-03 (×5): 0.4 mg via ORAL
  Filled 2021-06-29 (×4): qty 1

## 2021-06-29 MED ORDER — METHYLPREDNISOLONE SODIUM SUCC 40 MG IJ SOLR
40.0000 mg | Freq: Two times a day (BID) | INTRAMUSCULAR | Status: DC
  Administered 2021-06-29 – 2021-06-30 (×3): 40 mg via INTRAVENOUS
  Filled 2021-06-29: qty 1

## 2021-06-29 MED ORDER — GABAPENTIN 300 MG PO CAPS
ORAL_CAPSULE | ORAL | Status: AC
  Filled 2021-06-29: qty 1

## 2021-06-29 MED ORDER — PANTOPRAZOLE SODIUM 40 MG PO TBEC
40.0000 mg | DELAYED_RELEASE_TABLET | Freq: Every day | ORAL | Status: DC
  Administered 2021-06-29 – 2021-07-03 (×5): 40 mg via ORAL
  Filled 2021-06-29 (×4): qty 1

## 2021-06-29 MED ORDER — PANTOPRAZOLE SODIUM 40 MG PO TBEC
DELAYED_RELEASE_TABLET | ORAL | Status: AC
  Filled 2021-06-29: qty 1

## 2021-06-29 MED ORDER — VITAMIN B-12 1000 MCG PO TABS: 1000.0000 ug | ORAL_TABLET | ORAL | Status: DC

## 2021-06-29 MED ORDER — ASPIRIN EC 81 MG PO TBEC
DELAYED_RELEASE_TABLET | ORAL | Status: AC
  Filled 2021-06-29: qty 1

## 2021-06-29 MED ORDER — SODIUM CHLORIDE 0.9 % IV SOLN
100.0000 mg | Freq: Every day | INTRAVENOUS | Status: AC
  Administered 2021-06-30 – 2021-07-03 (×4): 100 mg via INTRAVENOUS
  Filled 2021-06-29 (×4): qty 100

## 2021-06-29 MED ORDER — METOPROLOL SUCCINATE ER 50 MG PO TB24
150.0000 mg | ORAL_TABLET | Freq: Every day | ORAL | Status: DC
  Administered 2021-06-29 – 2021-07-03 (×5): 150 mg via ORAL
  Filled 2021-06-29 (×4): qty 3

## 2021-06-29 MED ORDER — HALOPERIDOL LACTATE 5 MG/ML IJ SOLN
2.0000 mg | Freq: Once | INTRAMUSCULAR | Status: AC
  Administered 2021-06-29: 22:00:00 2 mg via INTRAMUSCULAR

## 2021-06-29 MED ORDER — METHYLPREDNISOLONE SODIUM SUCC 40 MG IJ SOLR
INTRAMUSCULAR | Status: AC
  Filled 2021-06-29: qty 1

## 2021-06-29 MED ORDER — TAMSULOSIN HCL 0.4 MG PO CAPS
ORAL_CAPSULE | ORAL | Status: AC
  Filled 2021-06-29: qty 1

## 2021-06-29 MED ORDER — ASPIRIN EC 81 MG PO TBEC
81.0000 mg | DELAYED_RELEASE_TABLET | Freq: Every day | ORAL | Status: DC
  Administered 2021-06-29 – 2021-07-03 (×5): 81 mg via ORAL
  Filled 2021-06-29 (×4): qty 1

## 2021-06-29 MED ORDER — GABAPENTIN 300 MG PO CAPS
300.0000 mg | ORAL_CAPSULE | Freq: Two times a day (BID) | ORAL | Status: DC
  Administered 2021-06-29 – 2021-07-03 (×8): 300 mg via ORAL
  Filled 2021-06-29 (×7): qty 1

## 2021-06-29 MED ORDER — SODIUM CHLORIDE 0.9 % IV SOLN
200.0000 mg | Freq: Once | INTRAVENOUS | Status: AC
  Administered 2021-06-29: 200 mg via INTRAVENOUS
  Filled 2021-06-29: qty 200

## 2021-06-29 MED ORDER — HALOPERIDOL LACTATE 5 MG/ML IJ SOLN: 5.0000 mg | Freq: Once | INTRAMUSCULAR | Status: DC

## 2021-06-29 MED ORDER — NITROFURANTOIN MONOHYD MACRO 100 MG PO CAPS
100.0000 mg | ORAL_CAPSULE | Freq: Every day | ORAL | Status: DC
  Administered 2021-06-29 – 2021-07-02 (×4): 100 mg via ORAL
  Filled 2021-06-29 (×6): qty 1

## 2021-06-29 NOTE — ED Notes (Signed)
Pt took off R mitt. Would not allow nurse to reapply. Pt is extremely restless and agitated and has continued to try to get OOB several times and continues to call out for daughter Tammy,who cannot come tonight and is aware of his delirium.

## 2021-06-29 NOTE — ED Notes (Addendum)
Bobby Peterson, Medic Student entered Pt's room with Bobby Peterson, Medic Student in response to the bed alarm. Pt noted to be sitting in the middle of bed and stating that he needs to leave. Pt also stated that he is getting up because his "hands are hot" from his gloves. Pt removed one glove. Bobby Peterson, Medic Student attempted to reapply glove, Pt not accepting and raised his fist at [facility] and stated, "don't you put that glove on me or I'll punch you." Pt informed that this was inappropriate behavior and Dashana Guizar reapplied glove. Pt upset and yelling for his daughter. Elie Goody, RN notified. Will continue to monitor.

## 2021-06-29 NOTE — ED Notes (Signed)
Helped pt up to toilet for BM.

## 2021-06-29 NOTE — Telephone Encounter (Signed)
Copied from [place] [id number]. Topic: Complaint - Provider (non-sensitive) >> Jun 25, 2021 11:28 AM Oneta Rack wrote: Caller wanted practice administrator to be aware [facility] web site reflects Saturday hours.

## 2021-06-29 NOTE — ED Notes (Signed)
Pt ate some breakfast with assistance from wife. Lunch tray now provided at bedside.

## 2021-06-29 NOTE — Consult Note (Signed)
Remdesivir - Pharmacy Brief Note   O:  ALT: 14 CXR: No evidence of pneumonia SpO2: 93% on 4L   A/P:  Remdesivir 200 mg IVPB once followed by 100 mg IVPB daily x 4 days.   Pearla Dubonnet, PharmD Clinical Pharmacist 06/29/2021 8:12 AM

## 2021-06-29 NOTE — ED Notes (Signed)
Warm blankets provided to pt and wife.

## 2021-06-29 NOTE — ED Notes (Signed)
Pt attempted to get OOB twice. Already got pt up to toilet. Pt moaninga nd groaning but cannot identify an area of pain. Calling out "I want my daughter!". Pt was not this way today when wife was at bedside. Bed alarm placed. Will call family to have hopefully come sit with pt.

## 2021-06-29 NOTE — ED Notes (Signed)
Soft mitts applied to pt. Pt attempted for fourth time to get out of bed and threatened to remove lines and monitoring equipment.

## 2021-06-29 NOTE — ED Notes (Signed)
Fall monitor applied to Pt's bed, turned on. Pt advised not to get up out of bed. Will continue to monitor.

## 2021-06-29 NOTE — ED Notes (Signed)
Pt decreased from 6L to 4L [place]. Sats remained between 92-95%.

## 2021-06-29 NOTE — ED Notes (Signed)
Pt satting 87 on 3LNC, increased to 6 and sats increased to 91. MD notified.

## 2021-06-29 NOTE — Progress Notes (Addendum)
[facility] [place] NAME: Bobby Peterson    MR#:  [id number]  DATE OF BIRTH:  November 16, 1940  SUBJECTIVE:   patient very frustrated. Wants to go home. Wife at bedside. No respiratory distress. Appears weak anticoagulation. Has chronic shortness of breath with oxygen at home. Came in with cough and increasing shortness of breath found to have covered pneumonia. REVIEW OF SYSTEMS:   Review of Systems  Constitutional:  Negative for chills, fever and weight loss.  HENT:  Negative for ear discharge, ear pain and nosebleeds.   Eyes:  Negative for blurred vision, pain and discharge.  Respiratory:  Negative for sputum production, shortness of breath, wheezing and stridor.   Cardiovascular:  Negative for chest pain, palpitations, orthopnea and PND.  Gastrointestinal:  Negative for abdominal pain, diarrhea, nausea and vomiting.  Genitourinary:  Negative for frequency and urgency.  Musculoskeletal:  Negative for back pain and joint pain.  Neurological:  Negative for sensory change, speech change, focal weakness and weakness.  Psychiatric/Behavioral:  Negative for depression and hallucinations. The patient is not nervous/anxious.   Tolerating Diet:yes Tolerating PT: pending  DRUG ALLERGIES:   Allergies  Allergen Reactions   Fenofibrate     Other reaction(s): Headache   Lisinopril Cough   Niacin     Other reaction(s): Dizziness   Phenergan [Promethazine Hcl]     NVD   Simvastatin     Other reaction(s): Headache    VITALS:  Blood pressure (!) 155/80, pulse (!) 102, temperature 98 F (36.7 C), temperature source Oral, resp. rate (!) 26, height 5\' 5"  (1.651 m), weight 78.3 kg, SpO2 100 %.  PHYSICAL EXAMINATION:   Physical Exam  GENERAL:  80 y.o.-year-old patient lying in the bed with no acute distress.  HEENT: Head atraumatic, normocephalic. Oropharynx and nasopharynx clear.  LUNGS: decreased breath sounds bilaterally, no wheezing, rales,  rhonchi. No use of accessory muscles of respiration.  CARDIOVASCULAR: S1, S2 normal. No murmurs, rubs, or gallops.  ABDOMEN: Soft, nontender, nondistended. Bowel sounds present. No organomegaly or mass.  EXTREMITIES: No cyanosis, clubbing or edema b/l.    NEUROLOGIC: nonfocal, weak PSYCHIATRIC:  patient is alert and oriented x 3.  SKIN: No obvious rash, lesion, or ulcer.   LABORATORY PANEL:  CBC Recent Labs  Lab 06/28/21 1019  WBC 9.5  HGB 14.9  HCT 46.7  PLT 192    Chemistries  Recent Labs  Lab 06/28/21 1019  NA 137  K 3.8  CL 102  CO2 27  GLUCOSE 136*  BUN 42*  CREATININE 2.01*  CALCIUM 8.6*  AST 19  ALT 14  ALKPHOS 71  BILITOT 0.9   Cardiac Enzymes No results for input(s): TROPONINI in the last 168 hours. RADIOLOGY:  DG Chest 2 View  Result Date: 06/28/2021 CLINICAL DATA:  Flu symptoms.  COPD and heart failure. EXAM: CHEST - 2 VIEW COMPARISON:  09/21/2018 FINDINGS: Hyperinflation. Osteopenia. Midline trachea. Borderline cardiomegaly. Atherosclerosis in the transverse aorta. No pleural effusion or pneumothorax. Lower lung predominant interstitial thickening. No lobar consolidation. Bibasilar scarring. IMPRESSION: No evidence of pneumonia. COPD as evidenced by hyperinflation and basilar predominant interstitial thickening. Aortic Atherosclerosis (ICD10-I70.0). Electronically Signed   By: Abigail Miyamoto M.D.   On: 06/28/2021 10:53   CT Abdomen Pelvis W Contrast  Result Date: 06/28/2021 CLINICAL DATA:  Moderate-sized hiatal hernia. EXAM: CT ABDOMEN AND PELVIS WITH CONTRAST TECHNIQUE: Multidetector CT imaging of the abdomen and pelvis was performed using the standard protocol following bolus administration  of intravenous contrast. CONTRAST:  81mL OMNIPAQUE IOHEXOL 300 MG/ML  SOLN COMPARISON:  CT with IV contrast 12/08/2017, CT with oral contrast 01/12/2015. FINDINGS: Lower chest: There small areas of peripheral consolidation concerning for multifocal pneumonia in the lung  bases, including in the anterior and posteromedial right lower lobe, the anterior right middle lobe, and the anterior base of the left lower lobe. There is mild cardiomegaly increased from prior studies but no pericardial effusion. There is a moderate-sized hiatal hernia. A calcified granuloma is again noted in the left lower lobe. Hepatobiliary: The liver is normal in size and mildly steatotic included with focal fat in the left lobe chronically noted as well as left hepatic pneumobilia with partially air distended common bile duct post cholecystectomy. There is chronic prominence of the common bile duct, measuring 10 mm. There is a 1.5 x 0.8 cm nodular lesion along the posterolateral aspect of the right lobe of the liver which would either represent a subcapsular lesion or a peritoneal lesion, previously 1.3 x 0.8 cm. Pancreas: No mass enhancement or ductal dilatation. Spleen: Calcified granulomas are again seen. No splenomegaly or mass enhancement. 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 There is mild chronic nodular thickening of the left adrenal gland with left kidney again demonstrating a 2.1 cm cyst in the inferior pole and few tiny lower pole cortical hypodensities which are stable but too small to fully characterize. There are no appreciable stones or hydronephrosis. Evaluation of the bladder again demonstrates a direct fistula to the mid sigmoid colon along the left anterior wall with moderate generalized thickening of the bladder and perivesical stranding consistent with cystitis. A large bladder stone has developed measuring 4.0 x 3.3 by 2.7 cm, and there is air in the anterior bladder. Stomach/Bowel: Unremarkable contracted stomach and small bowel. The appendix is normal caliber, well visible. There are diffuse colonic diverticula greatest in the sigmoid with increased stranding along the mid third of the sigmoid  colon including the portion communicating with the left anterior bladder wall, findings consistent with acute sigmoid diverticulitis. There is no free air or abscess. Vascular/Lymphatic: Aortic and branch vessel atherosclerosis. No enlarged abdominal or pelvic lymph nodes. Reproductive: Enlarged prostate 5 cm transverse, previously 4.7 cm. Other: No free air, hemorrhage or fluid. Small umbilical and inguinal fat hernias. Musculoskeletal: There is osteopenia with degenerative changes of the thoracic and lumbar spine but no worrisome regional skeletal lesion. IMPRESSION: 1. Peripheral opacities in the lung bases most likely due to multifocal pneumonia. 2. As in 2019 there is direct fistulous communication between the mid sigmoid colon and left anterior bladder wall with air in the bladder, moderate bladder thickening and adjacent stranding consistent with cystitis, and increased stranding around the mid sigmoid colon consistent with at least a mild acute diverticulitis. No free air or abscess is seen. 3. A large stone has developed in the dependent portion of the bladder measuring up to 4 cm. 4. 1.5 x 0.8 cm nodular lesion adjacent the posterolateral right hepatic lobe, only slightly larger than on the prior studies, probable benign process but PET-CT may be indicated given the slight interval change. This would either be a subcapsular protruding liver lesion or a peritoneal lesion abutting the liver capsule. 5. Prostatomegaly and remaining findings discussed above. Electronically Signed   By: Telford Nab M.D.   On: 06/28/2021 20:50   ASSESSMENT AND PLAN:  Bobby Peterson is a 80 y.o. male with medical  history significant for COPD on home O2 at 2 L, diastolic CHF, CKD 3, DM, CAD, colovesical fistula, who tested positive for COVID 9 days prior who presents to the ED with a complaint of progressively worsening symptoms of cough and shortness of breath as well as generalized weakness.  He denies fever or chills.   Denies chest pain, nausea, vomiting or diarrhea.   CT abdomen and pelvis Lung bases with multifocal pneumonia Fistulous communication between the mid sigmoid colon and bladder similar to 2019 with increased stranding consistent with cystitis and mild acute diverticulitis Bladder stone a 4 cm Nodular lesion liver    Pneumonia due to COVID-19 virus  Acute on chronic respiratory failure with hypoxia (HCC) -Chest CT showing multifocal pneumonia with positive COVID test 9 days prior --No strong evidence for sepsis or secondary bacterial infection --Patient received abx in the ER but will hold off for now --Remdesivir, methylprednisolone, albuterol, antitussives, vitamins --supplemental oxygen   Colovesical fistula, chronic --UA unable to analyse due to fecal material --Holding off on antibiotics. Was recently treated with levaquin --Consider urology/surgery consult if needed     COPD (chronic obstructive pulmonary disease --Albuterol prn     Coronary artery disease --continue aspirin, metoprolol, pravastatin     Chronic diastolic heart failure  --euvolemic --continue lasix, metoprolol     Chronic kidney disease (CKD), stage III  --Slightly above baseline       Diabetes mellitus, type II (HCC) --sliding scale insulin   Procedures: Family communication :tammy dter on the phone Consults : CODE STATUS: full DVT Prophylaxis :lovenox Level of care: Progressive Status is: Inpatient  Remains inpatient appropriate because: covid PNA        TOTAL TIME TAKING CARE OF THIS PATIENT: 25 minutes.  >50% time spent on counselling and coordination of care  Note: This dictation was prepared with Dragon dictation along with smaller phrase technology. Any transcriptional errors that result from this process are unintentional.  Fritzi Mandes M.D    [facility]   CC: Primary care physician; Virginia Crews, MD Patient ID: Bobby Peterson, male   DOB: 1940-11-10, 80 y.o.    MRN: [id number]

## 2021-06-29 NOTE — ED Notes (Signed)
Tried to call wife and daughter. Could not reach wife. Spoke with Lynelle Smoke, daughter. 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  Pt is restless and has attempted to get OOB. Asked Tammy to come sit with him and will inform CN that sitter may be needed. Applying bed alarm. Tammy states that in prior hospitalizations pt has had to have telesitter and has ripped out lines etc.

## 2021-06-29 NOTE — H&P (Signed)
History and Physical    Bobby Peterson OVZ:[id number] DOB: [birth year] DOA: 06/28/2021  PCP: Virginia Crews, MD   Patient coming from: home  I have personally briefly reviewed patient's relevant medical records in [place]  Chief Complaint: shortness of breath  HPI: Bobby Peterson is a 80 y.o. male with medical history significant for COPD on home O2 at 2 L, diastolic CHF, CKD 3, DM, CAD, colovesical fistula, who tested positive for COVID 9 days prior who presents to the ED with a complaint of progressively worsening symptoms of cough and shortness of breath as well as generalized weakness.  He denies fever or chills.  Denies chest pain, nausea, vomiting or diarrhea.  He does endorse lower abdominal pain and recently completed a course of antibiotics for UTI.  ED course: tachycardic at 124 with a low BP of 99/63, tachypneic to 31, and hypoxic to 84% on O2 at home flow rate, requiring 4 L to maintain sats in the mid to high 90s Blood work WBC 9500 with lactic acid 1.6-1.4, and procalcitonin 0.14 Creatinine 2.01, up from baseline of around 1.6 Urinalysis unable to run due to fecal material in urine  EKG, personally viewed and interpreted: Sinus tach at 125 with no acute ST-T wave changes  CT abdomen and pelvis Lung bases with multifocal pneumonia Fistulous communication between the mid sigmoid colon and bladder similar to 2019 with increased stranding consistent with cystitis and mild acute diverticulitis Bladder stone a 4 cm Nodular lesion liver  Patient treated for sepsis with Zosyn and azithromycin, as well as with Ventolin and methylprednisolone.  Hospitalist consulted for admission.   Review of Systems: As per HPI otherwise all other systems on review of systems negative.   Assessment/Plan    Pneumonia due to COVID-19 virus   Acute on chronic respiratory failure with hypoxia (HCC)   Possible sepsis, low suspicion -Chest CT showing multifocal pneumonia with  positive COVID test 9 days prior --No strong evidence for sepsis or secondary bacterial infection --Patient received abx in the ER but will hold off for now --bariticitinib, methylprednisolone, albuterol, antitussives, vitamins --supplemental oxygen --covid orderset orders --continue to monitor closely for developing sepsis   Possible UTI Colovesical fistula, chronic --UA unable to analyse due to fecal material --consider urology consult --Holding off on antibiotics. Was recently treated with levaquin --Consider urology/surgery consult    COPD (chronic obstructive pulmonary disease) ([place]) --Albuterol prn    Coronary artery disease --continue aspirin, metoprolol, pravastatin    Chronic diastolic heart failure (HCC) --euvolemic --continue lasix, metoprolol    Chronic kidney disease (CKD), stage III  --Slightly above baseline      Diabetes mellitus, type II (HCC) --sliding scale insulin   DVT prophylaxis: Lovenox  Code Status: full code  Family Communication:  none  Disposition Plan: Back to previous home environment Consults called: none  Status:At the time of admission, it appears that the appropriate admission status for this patient is INPATIENT. This is judged to be reasonable and necessary in order to provide the required intensity of service to ensure the patient's safety given the presenting symptoms, physical exam findings, and initial radiographic and laboratory data in the context of their  Comorbid conditions.   Patient requires inpatient status due to high intensity of service, high risk for further deterioration and high frequency of surveillance required.   I certify that at the point of admission it is my clinical judgment that the patient will require inpatient hospital care spanning beyond 2  midnights     Physical Exam: Vitals:   06/28/21 2100 06/28/21 2200 06/28/21 2230 06/28/21 2330  BP: 107/79 128/66 125/66 125/68  Pulse: (!) 117 98 96 97  Resp: 20  20 17 20   Temp:      TempSrc:      SpO2: 97% 96% 93% 93%  Weight:      Height:       Constitutional: Alert, oriented x 3 . In mild to moderate respiratory distress HEENT:      Head: Normocephalic and atraumatic.         Eyes: PERLA, EOMI, Conjunctivae are normal. Sclera is non-icteric.       Mouth/Throat: Mucous membranes are moist.       Neck: Supple with no signs of meningismus. Cardiovascular: Regular rate and rhythm. No murmurs, gallops, or rubs. 2+ symmetrical distal pulses are present . No JVD. No  LE edema Respiratory: Respiratory effort increased.Lungs sounds coarse bilaterally. Speaking in short sentences Gastrointestinal: Soft, non tender, non distended. Positive bowel sounds.  Genitourinary: No CVA tenderness. Musculoskeletal: Nontender with normal range of motion in all extremities. No cyanosis, or erythema of extremities. Neurologic:  Face is symmetric. Moving all extremities. No gross focal neurologic deficits . Skin: Skin is warm, dry.  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 on home O2   COPD (chronic obstructive pulmonary disease) (HCC)    COPD (chronic obstructive pulmonary disease) ([place]) 12/03/2013   May fifth 2015 simple spirometry>> ratio 45%, FEV1 0.95 L (34% per day)    Coronary artery disease    Coronary atherosclerosis 07/08/2015   Dr. Ubaldo Glassing    Esophageal reflux 40/0/8676   Hernia, umbilical     Hypercholesteremia    Hypertension    Obesity    Renal cancer ([place])    a. s/p nephrectomy in 1980's   Smoking 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 pack years 05/10/2016    Past Surgical History:  Procedure Laterality Date   CHOLECYSTECTOMY N/A 12/22/2014   Procedure: LAPAROSCOPIC CHOLECYSTECTOMY WITH INTRAOPERATIVE CHOLANGIOGRAM;  Surgeon: Christene Lye, MD;  Location: [facility] ORS;  Service: General;  Laterality: N/A;   COLONOSCOPY     ERCP N/A 01/05/2015   Procedure: ENDOSCOPIC RETROGRADE CHOLANGIOPANCREATOGRAPHY (ERCP);  Surgeon: Hulen Luster, MD;  [facility];  Service: Endoscopy;  Laterality: N/A;   ERCP N/A 02/19/2015   Procedure: ENDOSCOPIC RETROGRADE CHOLANGIOPANCREATOGRAPHY (ERCP);  Surgeon: Hulen Luster, MD;  Location: [facility] ENDOSCOPY;  Service: Gastroenterology;  Laterality: N/A;   GALLBLADDER SURGERY  11/2014   NEPHRECTOMY  1990   OMENTECTOMY  12/22/2014   Procedure: OMENTECTOMY;  Surgeon: Christene Lye, MD;  Location: [facility] ORS;  Service: General;;  Partial omentectomy   STENT REMOVAL     UMBILICAL HERNIA REPAIR N/A 12/22/2014   Procedure: HERNIA REPAIR UMBILICAL ADULT;  Surgeon: Christene Lye, MD;  Location: [facility] ORS;  Service: General;  Laterality:  N/A;     reports that he quit smoking about 17 years ago. His smoking use included cigarettes. He has a 50.00 pack-year smoking history. His smokeless tobacco use includes chew. He reports that he does not drink alcohol and does not use drugs.  Allergies  Allergen Reactions   Fenofibrate     Other reaction(s): Headache   Lisinopril Cough   Niacin     Other reaction(s): Dizziness   Phenergan [Promethazine Hcl]     NVD   Simvastatin     Other reaction(s): Headache    Family History  Problem Relation Age of Onset   Hypertension Mother       Prior to Admission medications   Medication Sig Start Date End Date Taking? Authorizing Provider  albuterol (VENTOLIN HFA) 108 (90 Base) MCG/ACT inhaler Inhale into the lungs  every 6 (six) hours as needed for wheezing or shortness of breath.    [provider]  aspirin 81 MG tablet Take 81 mg by mouth daily.    [provider]  furosemide (LASIX) 40 MG tablet TAKE 1 TABLET EVERY DAY 10/06/20   Bacigalupo, Dionne Bucy, MD  gabapentin (NEURONTIN) 300 MG capsule TAKE 1 CAPSULE TWICE DAILY 05/17/21   Bacigalupo, Dionne Bucy, MD  ipratropium-albuterol (DUONEB) 0.5-2.5 (3) MG/3ML SOLN INHALE THE CONTENTS OF 1 VIAL VIA NEBULIZER FOUR TIMES DAILY AS NEEDED 10/03/20   Bacigalupo, Dionne Bucy, MD  levofloxacin (LEVAQUIN) 500 MG tablet Take 500 mg by mouth daily. 06/17/21   [provider]  metFORMIN (GLUCOPHAGE) 500 MG tablet Take 1 tablet (500 mg total) by mouth 2 (two) times daily with a meal. 05/20/21   Bacigalupo, Dionne Bucy, MD  metoprolol succinate (TOPROL-XL) 100 MG 24 hr tablet TAKE 1 AND 1/2 TABLETS DAILY. TAKE WITH OR IMMEDIATELY FOLLOWING A MEAL. 04/13/21   Virginia Crews, MD  nitrofurantoin, macrocrystal-monohydrate, (MACROBID) 100 MG capsule TAKE 1 CAPSULE AT BEDTIME 10/06/20   Virginia Crews, MD  omeprazole (PRILOSEC) 40 MG capsule TAKE 1 CAPSULE DAILY 05/17/21   Virginia Crews, MD  OXYGEN Inhale 2 L/min into the lungs continuous.     [provider]  pravastatin (PRAVACHOL) 20 MG tablet TAKE 1 TABLET DAILY. PLEASE SCHEDULE OFFICE VISIT BEFORE ANY FUTURE REFILLS. 04/14/21   Chrismon, Vickki Muff, PA-C  predniSONE (DELTASONE) 20 MG tablet Take 40mg  daily with breakfast x4 days, then 20mg  daily with breakfast x4 days, then 10mg  daily x4d, then stop 04/02/21   Chrismon, Simona Huh E, PA-C  tamsulosin (FLOMAX) 0.4 MG CAPS capsule TAKE 1 CAPSULE EVERY DAY 04/07/21   Bacigalupo, Dionne Bucy, MD  vitamin B-12 (CYANOCOBALAMIN) 1000 MCG tablet Take 1,000 mcg by mouth once a week. *Take on Monday*    [provider]      Labs on Admission: I have personally reviewed following labs and imaging studies  CBC: Recent Labs  Lab 06/28/21 1019   WBC 9.5  HGB 14.9  HCT 46.7  MCV 89.8  PLT 937   Basic Metabolic Panel: Recent Labs  Lab 06/28/21 1019  NA 137  K 3.8  CL 102  CO2 27  GLUCOSE 136*  BUN 42*  CREATININE 2.01*  CALCIUM 8.6*   GFR: Estimated Creatinine Clearance: 27.3 mL/min (A) (by C-G formula based on SCr of 2.01 mg/dL (H)). Liver Function Tests: Recent Labs  Lab 06/28/21 1019  AST 19  ALT 14  ALKPHOS 71  BILITOT 0.9  PROT 6.9  ALBUMIN 3.5   Recent Labs  Lab 06/28/21 1019  LIPASE 33   No results for input(s): AMMONIA in the last 168 hours. Coagulation Profile: No results for input(s): INR, PROTIME in the last 168 hours. Cardiac Enzymes: No results for input(s): CKTOTAL, CKMB, CKMBINDEX, TROPONINI in the last 168 hours. BNP (last 3 results) No results for input(s): PROBNP in the last 8760 hours. HbA1C: No results for input(s): HGBA1C in the last 72 hours. CBG: No results for input(s): GLUCAP in the last 168 hours. Lipid Profile: No results for input(s): CHOL, HDL, LDLCALC, TRIG, CHOLHDL, LDLDIRECT in the last 72 hours. Thyroid Function Tests: No results for input(s): TSH, T4TOTAL, FREET4, T3FREE, THYROIDAB in the last 72 hours. Anemia Panel: No results for input(s): VITAMINB12, FOLATE, FERRITIN, TIBC, IRON, RETICCTPCT in the last 72 hours. Urine analysis:    Component Value Date/Time   COLORURINE YELLOW (A) 02/15/2018 0030   APPEARANCEUR CLOUDY (A) 02/15/2018 0030   APPEARANCEUR Cloudy (A) 11/16/2017 1015   LABSPEC 1.011 02/15/2018 0030   PHURINE 6.0 02/15/2018 0030   GLUCOSEU NEGATIVE 02/15/2018 0030   HGBUR MODERATE (A) 02/15/2018 0030   BILIRUBINUR small 05/14/2018 1057   BILIRUBINUR Negative 11/16/2017 1015   KETONESUR 5 (A) 02/15/2018 0030   PROTEINUR Positive (A) 05/14/2018 1057   PROTEINUR NEGATIVE 02/15/2018 0030   UROBILINOGEN 4.0 (A) 05/14/2018 1057   NITRITE negative 05/14/2018 1057   NITRITE NEGATIVE 02/15/2018 0030   LEUKOCYTESUR Large (3+) (A) 05/14/2018 1057    LEUKOCYTESUR 3+ (A) 11/16/2017 1015    Radiological Exams on Admission: DG Chest 2 View  Result Date: 06/28/2021 CLINICAL DATA:  Flu symptoms.  COPD and heart failure. EXAM: CHEST - 2 VIEW COMPARISON:  09/21/2018 FINDINGS: Hyperinflation. Osteopenia. Midline trachea. Borderline cardiomegaly. Atherosclerosis in the transverse aorta. No pleural effusion or pneumothorax. Lower lung predominant interstitial thickening. No lobar consolidation. Bibasilar scarring. IMPRESSION: No evidence of pneumonia. COPD as evidenced by hyperinflation and basilar predominant interstitial thickening. Aortic Atherosclerosis (ICD10-I70.0). Electronically Signed   By: Abigail Miyamoto M.D.   On: 06/28/2021 10:53   CT Abdomen Pelvis W Contrast  Result Date: 06/28/2021 CLINICAL DATA:  Moderate-sized hiatal hernia. EXAM: CT ABDOMEN AND PELVIS WITH CONTRAST TECHNIQUE: Multidetector CT imaging of the abdomen and pelvis was performed using the standard protocol following bolus administration of intravenous contrast. CONTRAST:  40mL OMNIPAQUE IOHEXOL 300 MG/ML  SOLN COMPARISON:  CT with IV contrast 12/08/2017, CT with oral contrast 01/12/2015. FINDINGS: Lower chest: There small areas of peripheral consolidation concerning for multifocal pneumonia in the lung bases, including in the anterior and posteromedial right lower lobe, the anterior right middle lobe, and the anterior base of the left lower lobe. There is mild cardiomegaly increased from prior studies but no pericardial effusion. There is a moderate-sized hiatal hernia. A calcified granuloma is again noted in the left lower lobe. Hepatobiliary: The liver is normal in size and mildly steatotic included with focal fat in the left lobe chronically noted as well as left hepatic pneumobilia with partially air distended common bile duct post cholecystectomy. There is chronic prominence of the common bile duct, measuring 10 mm. There is a 1.5 x 0.8 cm nodular lesion along the posterolateral  aspect of the right lobe of the liver which would either represent a subcapsular lesion or a peritoneal lesion, previously 1.3 x 0.8 cm. Pancreas: No mass enhancement or ductal dilatation. Spleen: Calcified granulomas are again seen. No splenomegaly or mass enhancement. 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 There is mild chronic  nodular thickening of the left adrenal gland with left kidney again demonstrating a 2.1 cm cyst in the inferior pole and few tiny lower pole cortical hypodensities which are stable but too small to fully characterize. There are no appreciable stones or hydronephrosis. Evaluation of the bladder again demonstrates a direct fistula to the mid sigmoid colon along the left anterior wall with moderate generalized thickening of the bladder and perivesical stranding consistent with cystitis. A large bladder stone has developed measuring 4.0 x 3.3 by 2.7 cm, and there is air in the anterior bladder. Stomach/Bowel: Unremarkable contracted stomach and small bowel. The appendix is normal caliber, well visible. There are diffuse colonic diverticula greatest in the sigmoid with increased stranding along the mid third of the sigmoid colon including the portion communicating with the left anterior bladder wall, findings consistent with acute sigmoid diverticulitis. There is no free air or abscess. Vascular/Lymphatic: Aortic and branch vessel atherosclerosis. No enlarged abdominal or pelvic lymph nodes. Reproductive: Enlarged prostate 5 cm transverse, previously 4.7 cm. Other: No free air, hemorrhage or fluid. Small umbilical and inguinal fat hernias. Musculoskeletal: There is osteopenia with degenerative changes of the thoracic and lumbar spine but no worrisome regional skeletal lesion. IMPRESSION: 1. Peripheral opacities in the lung bases most likely due to multifocal pneumonia. 2. As in 2019 there is direct  fistulous communication between the mid sigmoid colon and left anterior bladder wall with air in the bladder, moderate bladder thickening and adjacent stranding consistent with cystitis, and increased stranding around the mid sigmoid colon consistent with at least a mild acute diverticulitis. No free air or abscess is seen. 3. A large stone has developed in the dependent portion of the bladder measuring up to 4 cm. 4. 1.5 x 0.8 cm nodular lesion adjacent the posterolateral right hepatic lobe, only slightly larger than on the prior studies, probable benign process but PET-CT may be indicated given the slight interval change. This would either be a subcapsular protruding liver lesion or a peritoneal lesion abutting the liver capsule. 5. Prostatomegaly and remaining findings discussed above. Electronically Signed   By: Telford Nab M.D.   On: 06/28/2021 20:50       Athena Masse MD [facility]   06/29/2021, 12:05 AM

## 2021-06-30 LAB — CBC
HCT: 47.5 % (ref 39.0–52.0)
Hemoglobin: 15.2 g/dL (ref 13.0–17.0)
MCH: 28.5 pg (ref 26.0–34.0)
MCHC: 32 g/dL (ref 30.0–36.0)
MCV: 89.1 fL (ref 80.0–100.0)
Platelets: 260 10*3/uL (ref 150–400)
RBC: 5.33 MIL/uL (ref 4.22–5.81)
RDW: 15.4 % (ref 11.5–15.5)
WBC: 9.7 10*3/uL (ref 4.0–10.5)
nRBC: 0 % (ref 0.0–0.2)

## 2021-06-30 LAB — GLUCOSE, CAPILLARY
Glucose-Capillary: 178 mg/dL — ABNORMAL HIGH (ref 70–99)
Glucose-Capillary: 165 mg/dL — ABNORMAL HIGH (ref 70–99)
Glucose-Capillary: 136 mg/dL — ABNORMAL HIGH (ref 70–99)

## 2021-06-30 LAB — HEMOGLOBIN A1C
Hgb A1c MFr Bld: 6.1 % — ABNORMAL HIGH (ref 4.8–5.6)
Mean Plasma Glucose: 128 mg/dL

## 2021-06-30 LAB — CREATININE, SERUM
Creatinine, Ser: 1.25 mg/dL — ABNORMAL HIGH (ref 0.61–1.24)
GFR, Estimated: 57 mL/min — ABNORMAL LOW (ref >60)

## 2021-06-30 MED ORDER — PREDNISONE 50 MG PO TABS
50.0000 mg | ORAL_TABLET | Freq: Every day | ORAL | Status: DC
  Administered 2021-07-01 – 2021-07-03 (×3): 50 mg via ORAL
  Filled 2021-06-30 (×3): qty 1

## 2021-06-30 MED ORDER — TRAZODONE HCL 50 MG PO TABS
25.0000 mg | ORAL_TABLET | Freq: Every evening | ORAL | Status: DC | PRN
  Administered 2021-06-30 – 2021-07-02 (×2): 25 mg via ORAL
  Filled 2021-06-30 (×3): qty 1

## 2021-06-30 MED ORDER — INSULIN ASPART 100 UNIT/ML IJ SOLN
0.0000 [IU] | Freq: Every day | INTRAMUSCULAR | Status: DC
  Administered 2021-07-01: 21:00:00 2 [IU] via SUBCUTANEOUS
  Filled 2021-06-30: qty 1

## 2021-06-30 MED ORDER — INSULIN ASPART 100 UNIT/ML IJ SOLN: 0.0000 [IU] | Freq: Three times a day (TID) | INTRAMUSCULAR | Status: DC

## 2021-06-30 MED ORDER — ENOXAPARIN SODIUM 40 MG/0.4ML IJ SOSY
40.0000 mg | PREFILLED_SYRINGE | INTRAMUSCULAR | Status: DC
  Administered 2021-07-01 – 2021-07-03 (×3): 40 mg via SUBCUTANEOUS
  Filled 2021-06-30 (×3): qty 0.4

## 2021-06-30 MED ORDER — INSULIN ASPART 100 UNIT/ML IJ SOLN
0.0000 [IU] | Freq: Three times a day (TID) | INTRAMUSCULAR | Status: DC
  Administered 2021-06-30: 17:00:00 2 [IU] via SUBCUTANEOUS
  Administered 2021-07-01: 3 [IU] via SUBCUTANEOUS
  Administered 2021-07-01 – 2021-07-02 (×2): 2 [IU] via SUBCUTANEOUS
  Filled 2021-06-30 (×3): qty 1

## 2021-06-30 MED ORDER — ENOXAPARIN SODIUM 30 MG/0.3ML IJ SOSY
30.0000 mg | PREFILLED_SYRINGE | INTRAMUSCULAR | Status: DC
  Administered 2021-06-30: 30 mg via SUBCUTANEOUS
  Filled 2021-06-30: qty 0.3

## 2021-06-30 NOTE — Progress Notes (Signed)
[facility] [place] NAME: Bobby Peterson    MR#:  [id number]  DATE OF BIRTH:  May 22, 1941  SUBJECTIVE:   Pt appears "happy" now that he gets to sit in the recliner REVIEW OF SYSTEMS:   Review of Systems  Constitutional:  Negative for chills, fever and weight loss.  HENT:  Negative for ear discharge, ear pain and nosebleeds.   Eyes:  Negative for blurred vision, pain and discharge.  Respiratory:  Positive for shortness of breath. Negative for sputum production, wheezing and stridor.   Cardiovascular:  Negative for chest pain, palpitations, orthopnea and PND.  Gastrointestinal:  Negative for abdominal pain, diarrhea, nausea and vomiting.  Genitourinary:  Negative for frequency and urgency.  Musculoskeletal:  Negative for back pain and joint pain.  Neurological:  Positive for weakness. Negative for sensory change, speech change and focal weakness.  Psychiatric/Behavioral:  Negative for depression and hallucinations. The patient is not nervous/anxious.   Tolerating Diet:yes Tolerating PT: NO PT needs  DRUG ALLERGIES:   Allergies  Allergen Reactions   Fenofibrate     Other reaction(s): Headache   Lisinopril Cough   Niacin     Other reaction(s): Dizziness   Phenergan [Promethazine Hcl]     NVD   Simvastatin     Other reaction(s): Headache    VITALS:  Blood pressure (!) 153/98, pulse (!) 110, temperature 98.7 F (37.1 C), resp. rate 19, height 5\' 5"  (1.651 m), weight 78.3 kg, SpO2 98 %.  PHYSICAL EXAMINATION:   Physical Exam  GENERAL:  80 y.o.-year-old patient lying in the bed with no acute distress.  HEENT: Head atraumatic, normocephalic. Oropharynx and nasopharynx clear.  LUNGS: decreased breath sounds bilaterally, no wheezing, rales, rhonchi. No use of accessory muscles of respiration.  CARDIOVASCULAR: S1, S2 normal. No murmurs, rubs, or gallops.  ABDOMEN: Soft, nontender, nondistended. Bowel sounds present. No organomegaly or  mass.  EXTREMITIES: No cyanosis, clubbing or edema b/l.    NEUROLOGIC: nonfocal, weak PSYCHIATRIC:  patient is alert and oriented x 3.  SKIN: No obvious rash, lesion, or ulcer.   LABORATORY PANEL:  CBC Recent Labs  Lab 06/30/21 0612  WBC 9.7  HGB 15.2  HCT 47.5  PLT 260     Chemistries  Recent Labs  Lab 06/28/21 1019 06/30/21 0612  NA 137  --   K 3.8  --   CL 102  --   CO2 27  --   GLUCOSE 136*  --   BUN 42*  --   CREATININE 2.01* 1.25*  CALCIUM 8.6*  --   AST 19  --   ALT 14  --   ALKPHOS 71  --   BILITOT 0.9  --     Cardiac Enzymes No results for input(s): TROPONINI in the last 168 hours. RADIOLOGY:  CT Abdomen Pelvis W Contrast  Result Date: 06/28/2021 CLINICAL DATA:  Moderate-sized hiatal hernia. EXAM: CT ABDOMEN AND PELVIS WITH CONTRAST TECHNIQUE: Multidetector CT imaging of the abdomen and pelvis was performed using the standard protocol following bolus administration of intravenous contrast. CONTRAST:  62mL OMNIPAQUE IOHEXOL 300 MG/ML  SOLN COMPARISON:  CT with IV contrast 12/08/2017, CT with oral contrast 01/12/2015. FINDINGS: Lower chest: There small areas of peripheral consolidation concerning for multifocal pneumonia in the lung bases, including in the anterior and posteromedial right lower lobe, the anterior right middle lobe, and the anterior base of the left lower lobe. There is mild cardiomegaly increased from prior studies but no  pericardial effusion. There is a moderate-sized hiatal hernia. A calcified granuloma is again noted in the left lower lobe. Hepatobiliary: The liver is normal in size and mildly steatotic included with focal fat in the left lobe chronically noted as well as left hepatic pneumobilia with partially air distended common bile duct post cholecystectomy. There is chronic prominence of the common bile duct, measuring 10 mm. There is a 1.5 x 0.8 cm nodular lesion along the posterolateral aspect of the right lobe of the liver which would  either represent a subcapsular lesion or a peritoneal lesion, previously 1.3 x 0.8 cm. Pancreas: No mass enhancement or ductal dilatation. Spleen: Calcified granulomas are again seen. No splenomegaly or mass enhancement. 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 There is mild chronic nodular thickening of the left adrenal gland with left kidney again demonstrating a 2.1 cm cyst in the inferior pole and few tiny lower pole cortical hypodensities which are stable but too small to fully characterize. There are no appreciable stones or hydronephrosis. Evaluation of the bladder again demonstrates a direct fistula to the mid sigmoid colon along the left anterior wall with moderate generalized thickening of the bladder and perivesical stranding consistent with cystitis. A large bladder stone has developed measuring 4.0 x 3.3 by 2.7 cm, and there is air in the anterior bladder. Stomach/Bowel: Unremarkable contracted stomach and small bowel. The appendix is normal caliber, well visible. There are diffuse colonic diverticula greatest in the sigmoid with increased stranding along the mid third of the sigmoid colon including the portion communicating with the left anterior bladder wall, findings consistent with acute sigmoid diverticulitis. There is no free air or abscess. Vascular/Lymphatic: Aortic and branch vessel atherosclerosis. No enlarged abdominal or pelvic lymph nodes. Reproductive: Enlarged prostate 5 cm transverse, previously 4.7 cm. Other: No free air, hemorrhage or fluid. Small umbilical and inguinal fat hernias. Musculoskeletal: There is osteopenia with degenerative changes of the thoracic and lumbar spine but no worrisome regional skeletal lesion. IMPRESSION: 1. Peripheral opacities in the lung bases most likely due to multifocal pneumonia. 2. As in 2019 there is direct fistulous communication between the mid sigmoid colon  and left anterior bladder wall with air in the bladder, moderate bladder thickening and adjacent stranding consistent with cystitis, and increased stranding around the mid sigmoid colon consistent with at least a mild acute diverticulitis. No free air or abscess is seen. 3. A large stone has developed in the dependent portion of the bladder measuring up to 4 cm. 4. 1.5 x 0.8 cm nodular lesion adjacent the posterolateral right hepatic lobe, only slightly larger than on the prior studies, probable benign process but PET-CT may be indicated given the slight interval change. This would either be a subcapsular protruding liver lesion or a peritoneal lesion abutting the liver capsule. 5. Prostatomegaly and remaining findings discussed above. Electronically Signed   By: Telford Nab M.D.   On: 06/28/2021 20:50   ASSESSMENT AND PLAN:  Bobby Peterson is a 80 y.o. male with medical history significant for COPD on home O2 at 2 L, diastolic CHF, CKD 3, DM, CAD, colovesical fistula, who tested positive for COVID 9 days prior who presents to the ED with a complaint of progressively worsening symptoms of cough and shortness of breath as well as generalized weakness.  He denies fever or chills.  Denies chest pain, nausea, vomiting or diarrhea.   CT abdomen and pelvis Lung bases with multifocal pneumonia  Fistulous communication between the mid sigmoid colon and bladder similar to 2019 with increased stranding consistent with cystitis and mild acute diverticulitis Bladder stone a 4 cm Nodular lesion liver    Pneumonia due to COVID-19 virus  Acute on chronic respiratory failure with hypoxia (HCC) -Chest CT showing multifocal pneumonia with positive COVID test 9 days prior --No strong evidence for sepsis or secondary bacterial infection --Patient received abx in the ER but will hold off for now --Remdesivir (2/5), methylprednisolone, albuterol, antitussives, vitamins --supplemental oxygen   Colovesical fistula,  chronic --UA unable to analyse due to fecal material --Holding off on antibiotics. Was recently treated with levaquin --Consider urology/surgery consult if needed    COPD (chronic obstructive pulmonary disease --Albuterol prn   Coronary artery disease --continue aspirin, metoprolol, pravastatin   Chronic diastolic heart failure  --euvolemic --continue lasix, metoprolol     Chronic kidney disease (CKD), stage III  --Slightly above baseline    Diabetes mellitus, type II (HCC) --sliding scale insulin --on metformin at home    Family communication :tammy dter on the phone Consults : CODE STATUS: full DVT Prophylaxis :lovenox Level of care: Med-Surg Status is: Inpatient  Remains inpatient appropriate because: covid PNA        TOTAL TIME TAKING CARE OF THIS PATIENT: 25 minutes.  >50% time spent on counselling and coordination of care  Note: This dictation was prepared with Dragon dictation along with smaller phrase technology. Any transcriptional errors that result from this process are unintentional.  Bobby Peterson M.D    [facility]   CC: Primary care physician; Virginia Crews, MD Patient ID: Bobby Peterson, male   DOB: [birth year], 80 y.o.   MRN: [id number]

## 2021-06-30 NOTE — TOC Initial Note (Signed)
Transition of Care [facility]) - Initial/Assessment Note    Patient Details  Name: Bobby Peterson MRN: [id number] Date of Birth: 06-Sep-1940  [facility] CM/SW Contact:    Shelbie Hutching, RN Phone Number: 06/30/2021, 10:29 AM  Clinical Narrative:                 Patient admitted to the hospital with COVID pneumonia.  Patient is from home with his wife.  Plan for discharge tentatively on Saturday.  TOC consult for home heath and DME needs.  PT worked with patient and is recommending no follow up at this time.   TOC will cont to follow for any other discharge needs.    Expected Discharge Plan: Home/Self Care Barriers to Discharge: Continued Medical Work up   Patient Goals and CMS Choice        Expected Discharge Plan and Services Expected Discharge Plan: Home/Self Care   Discharge Planning Services: CM Consult   Living arrangements for the past 2 months: Single Family Home                 DME Arranged: N/A DME Agency: NA       HH Arranged: NA HH Agency: NA        Prior Living Arrangements/Services Living arrangements for the past 2 months: Single Family Home Lives with:: Spouse Patient language and need for interpreter reviewed:: Yes Do you feel safe going back to the place where you live?: Yes      Need for Family Participation in Patient Care: Yes (Comment) (COVID) Care giver support system in place?: Yes (comment) (wife)   Criminal Activity/Legal Involvement Pertinent to Current Situation/Hospitalization: No - Comment as needed  Activities of Daily Living Home Assistive Devices/Equipment: None ADL Screening (condition at time of admission) Patient's cognitive ability adequate to safely complete daily activities?: Yes Is the patient deaf or have difficulty hearing?: Yes Does the patient have difficulty seeing, even when wearing glasses/contacts?: Yes Does the patient have difficulty concentrating, remembering, or making decisions?: No Patient able  to express need for assistance with ADLs?: Yes Does the patient have difficulty dressing or bathing?: Yes Independently performs ADLs?: No Communication: Needs assistance Is this a change from baseline?: Change from baseline, expected to last <3 days Dressing (OT): Needs assistance Is this a change from baseline?: Change from baseline, expected to last <3days Grooming: Needs assistance Is this a change from baseline?: Change from baseline, expected to last <3 days Feeding: Needs assistance Is this a change from baseline?: Change from baseline, expected to last <3 days Bathing: Needs assistance Is this a change from baseline?: Change from baseline, expected to last <3 days Toileting: Needs assistance Is this a change from baseline?: Change from baseline, expected to last <3 days In/Out Bed: Needs assistance Is this a change from baseline?: Change from baseline, expected to last <3 days Walks in Home: Needs assistance Is this a change from baseline?: Change from baseline, expected to last <3 days Does the patient have difficulty walking or climbing stairs?: Yes Weakness of Legs: Both Weakness of Arms/Hands: None  Permission Sought/Granted                  Emotional Assessment       Orientation: : Oriented to Self, Oriented to Place Alcohol / Substance Use: Not Applicable Psych Involvement: No (comment)  Admission diagnosis:  Colovesical fistula [N32.1] Acute respiratory failure with hypoxia ([place]) [J96.01] Sepsis with acute hypoxic respiratory failure without septic shock,  due to unspecified organism ([place]) [A41.9, R65.20, J96.01] Pneumonia due to COVID-19 virus [U07.1, J12.82] COVID-19 [U07.1] Patient Active Problem List   Diagnosis Date Noted   Pneumonia due to COVID-19 virus 06/28/2021   Diabetes mellitus, type II ([place]) 05/31/2021   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   Acute on chronic respiratory failure with hypoxia ([place]) 02/14/2018   Gouty arthritis 08/11/2017    Smoking 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 pack years 05/10/2016   Allergic rhinitis 07/08/2015   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   Coronary artery disease 07/08/2015   Chronic diastolic heart failure ([place]) 07/08/2015   Chronic kidney disease (CKD), stage III (moderate) ([place]) 07/08/2015   Esophageal reflux 07/08/2015   Hyperlipidemia associated with type 2 diabetes mellitus ([place]) 07/08/2015   Hypertension associated with diabetes ([place]) 07/08/2015   Calculus of kidney 07/08/2015   COPD (chronic obstructive pulmonary disease) ([place]) 12/03/2013   Chronic hypoxemic respiratory failure ([place]) 12/03/2013   PCP:  Virginia Crews, MD Pharmacy:   [facility] [address]), [place]) [place] [address] Phone: [phone] Fax: [facility] [place], [place] [address] Phone: [phone] Fax: [phone]     Social Determinants of Health (SDOH) Interventions    Readmission Risk Interventions No flowsheet data found.

## 2021-06-30 NOTE — Evaluation (Signed)
Physical Therapy Evaluation Patient Details Name: Bobby Peterson MRN: [id number] DOB: 30-Jan-1941 Today's Date: 06/30/2021  History of Present Illness  Bobby Peterson is a 80 y.o. male with medical history significant for COPD on home O2 at 2 L, diastolic CHF, CKD 3, DM, CAD, colovesical fistula, who tested positive for COVID 9 days prior who presents to the ED with a complaint of progressively worsening symptoms of cough and shortness of breath as well as generalized weakness.   Clinical Impression  Pt admitted with above diagnosis. Pt received upright in recliner in sacral sitting sliding out towards edge of recliner. Pt's wife having difficulty assisting pt. Pt alert and oriented to person and situation. Thinks it is the 1920's and required cuing to understand he is in the hospital. Then able to report why he is here. Pt does require some assist from wife for PLOF, home lay out, DME available. Per wife, pt is indep with household amb with no AD despite being legally blind. Good understanding of home lay out and reporting being indep with bathing, dressing, feeding ADL's. Pt able to stand with sueprvision with safe hand placement and no difficulty. In standing good balance with no unsteadiness noted. Pt impulsive attempting to amb in room with O2 connecting to wall. Pt required MAX multimodal cues and redirection for pt to stop and sit at EOB. Pt displaying good ability to transfer again from bed and amb short distance back to recliner. Due to being legally blind, HoH, and difficutly with following simple commands, deferred further mobility. Per wife pt is at baseline mobility and she is confident pt is safe to return to home environment. Although assessment is limited, pt did perform transfers and short ambulation distances in room without balance deficits and physical assist. Anticipate pt is safe to return back to home environment with no PT F/u. Pt currently with functional limitations due to the  deficits listed below (see PT Problem List). Pt will benefit from skilled PT to increase their independence and safety with mobility to allow discharge to the venue listed below.      Recommendations for follow up therapy are one component of a multi-disciplinary discharge planning process, led by the attending physician.  Recommendations may be updated based on patient status, additional functional criteria and insurance authorization.  Follow Up Recommendations No PT follow up    Assistance Recommended at Discharge Frequent or constant Supervision/Assistance  Functional Status Assessment Patient has had a recent decline in their functional status and demonstrates the ability to make significant improvements in function in a reasonable and predictable amount of time.  Equipment Recommendations  None recommended by PT    Recommendations for Other Services       Precautions / Restrictions Precautions Precautions: Fall Restrictions Weight Bearing Restrictions: No      Mobility  Bed Mobility               General bed mobility comments: Received and returned to recliner Patient Response: Flat affect;Restless  Transfers Overall transfer level: Needs assistance Equipment used: None Transfers: Sit to/from Stand Sit to Stand: Min guard           General transfer comment: Stands with safe hand placement on chair rails    Ambulation/Gait Ambulation/Gait assistance: Supervision Gait Distance (Feet): 2 Feet Assistive device: None Gait Pattern/deviations: Step-to pattern;Shuffle       General Gait Details: Impulsively trying to ambulate with O2 plugged into wall. Required max redirection. Difficult to re-direct. Deferred further ambulation  due to difficulty folloing commands and being legally blind and HoH.  Stairs            Wheelchair Mobility    Modified Rankin (Stroke Patients Only)       Balance Overall balance assessment: Needs  assistance Sitting-balance support: No upper extremity supported;Feet supported Sitting balance-Leahy Scale: Fair     Standing balance support: During functional activity;No upper extremity supported Standing balance-Leahy Scale: Fair Standing balance comment: no LOB or unsteadiness appreciated with standing balance                             Pertinent Vitals/Pain Pain Assessment: Faces Faces Pain Scale: Hurts a little bit Pain Location: Lower abdomen Pain Descriptors / Indicators: Discomfort Pain Intervention(s): Monitored during session    Home Living Family/patient expects to be discharged to:: Private residence Living Arrangements: Spouse/significant other Available Help at Discharge: Family;Available 24 hours/day Type of Home: House Home Access: Ramped entrance       Home Layout: One level Home Equipment: Conservation officer, nature (2 wheels);BSC/3in1;Shower seat;Cane - single point      Prior Function Prior Level of Function : Independent/Modified Independent             Mobility Comments: Per wife, pt amb with no AD or assist in household despite being legally blind. ADLs Comments: Wife reports he is independent with bathing, dressing, feeding.     Hand Dominance        Extremity/Trunk Assessment   Upper Extremity Assessment Upper Extremity Assessment: Overall WFL for tasks assessed    Lower Extremity Assessment Lower Extremity Assessment: Overall WFL for tasks assessed    Cervical / Trunk Assessment Cervical / Trunk Assessment: Normal  Communication   Communication: HOH  Cognition Arousal/Alertness: Awake/alert Behavior During Therapy: Flat affect Overall Cognitive Status: Impaired/Different from baseline Area of Impairment: Orientation;Following commands;Safety/judgement                 Orientation Level: Place;Time     Following Commands: Follows one step commands inconsistently Safety/Judgement: Decreased awareness of safety      General Comments: Oriented to person and situation. Per EMR, hospital delirium, no known history of cognitive deficits in EMR.        General Comments      Exercises Other Exercises Other Exercises: Role of PT in acute setting, D/c recs.   Assessment/Plan    PT Assessment Patient needs continued PT services  PT Problem List Decreased cognition;Decreased mobility;Decreased safety awareness       PT Treatment Interventions DME instruction;Therapeutic exercise;Gait training;Balance training;Neuromuscular re-education;Functional mobility training;Therapeutic activities;Patient/family education    PT Goals (Current goals can be found in the Care Plan section)  Acute Rehab PT Goals Patient Stated Goal: return home PT Goal Formulation: With patient Time For Goal Achievement: 07/14/21 Potential to Achieve Goals: Good    Frequency Min 2X/week   Barriers to discharge        Co-evaluation               AM-PAC PT "6 Clicks" Mobility  Outcome Measure Help needed turning from your back to your side while in a flat bed without using bedrails?: A Little Help needed moving from lying on your back to sitting on the side of a flat bed without using bedrails?: A Little Help needed moving to and from a bed to a chair (including a wheelchair)?: None Help needed standing up from a chair using your arms (  e.g., wheelchair or bedside chair)?: A Little Help needed to walk in hospital room?: A Little Help needed climbing 3-5 steps with a railing? : A Lot 6 Click Score: 18    End of Session Equipment Utilized During Treatment: Gait belt Activity Tolerance: Patient tolerated treatment well Patient left: in chair;with family/visitor present;with chair alarm set;with call bell/phone within reach Nurse Communication: Mobility status PT Visit Diagnosis: Other abnormalities of gait and mobility (R26.89)    Time: 0447-1580 PT Time Calculation (min) (ACUTE ONLY): 14 min   Charges:   PT  Evaluation $PT Eval Low Complexity: [place] M. Fairly IV, PT, DPT Physical Therapist- [facility]   06/30/2021, 11:13 AM

## 2021-06-30 NOTE — Progress Notes (Signed)
PHARMACIST - PHYSICIAN COMMUNICATION  CONCERNING:  Enoxaparin (Lovenox) for DVT Prophylaxis    RECOMMENDATION: Patient was prescribed enoxaprin 30mg  q24 hours for VTE prophylaxis.   Filed Weights   06/28/21 1017  Weight: 78.3 kg (172 lb 9.9 oz)    Body mass index is 28.73 kg/m.  Estimated Creatinine Clearance: 44 mL/min (A) (by C-G formula based on SCr of 1.25 mg/dL (H)).    Patient is candidate for enoxaparin 40mg  every 24 hours based on CrCl >22ml/min and Weight >45kg  DESCRIPTION: Pharmacy has adjusted enoxaparin dose per [facility] policy.  Patient is now receiving enoxaparin 40 mg every 24 hours    Tykeem Lanzer Rodriguez-Guzman PharmD, BCPS 06/30/2021 9:37 AM

## 2021-06-30 NOTE — Progress Notes (Signed)
Spoke with family member about patient getting up several times without assistance from bed to chair, even though educating patient about safety awareness. Daughter agreed to stay the night with patient. He is currently sitting in chair with bed alarm in place, using urinal.

## 2021-07-01 LAB — GLUCOSE, CAPILLARY
Glucose-Capillary: 109 mg/dL — ABNORMAL HIGH (ref 70–99)
Glucose-Capillary: 219 mg/dL — ABNORMAL HIGH (ref 70–99)
Glucose-Capillary: 182 mg/dL — ABNORMAL HIGH (ref 70–99)
Glucose-Capillary: 225 mg/dL — ABNORMAL HIGH (ref 70–99)

## 2021-07-01 NOTE — Progress Notes (Signed)
   07/01/21 1944  Assess: MEWS Score  BP 100/82  Pulse Rate (!) 102  Resp (!) 22  SpO2 94 %  O2 Device Nasal Cannula  O2 Flow Rate (L/min) 2 L/min  Assess: MEWS Score  MEWS Temp 0  MEWS Systolic 1  MEWS Pulse 1  MEWS RR 1  MEWS LOC 0  MEWS Score 3  MEWS Score Color Yellow  Assess: if the MEWS score is Yellow or Red  Were vital signs taken at a resting state? Yes  Focused Assessment No change from prior assessment  Does the patient meet 2 or more of the SIRS criteria? Yes  Does the patient have a confirmed or suspected source of infection? Yes  Provider and Rapid Response Notified? No  MEWS guidelines implemented *See Row Information* Yes  Treat  MEWS Interventions Administered scheduled meds/treatments  Pain Scale 0-10  Pain Score 0  Take Vital Signs  Increase Vital Sign Frequency  Yellow: Q 2hr X 2 then Q 4hr X 2, if remains yellow, continue Q 4hrs  Escalate  MEWS: Escalate Yellow: discuss with charge nurse/RN and consider discussing with provider and RRT  Notify: Charge Nurse/RN  Name of Charge Nurse/RN Notified Leamon Arnt RN  Date Charge Nurse/RN Notified 07/01/21  Time Charge Nurse/RN Notified 2000  Document  Patient Outcome Other (Comment)  Progress note created (see row info) Yes  Assess: SIRS CRITERIA  SIRS Temperature  0  SIRS Pulse 1  SIRS Respirations  1  SIRS WBC 0  SIRS Score Sum  2

## 2021-07-01 NOTE — Plan of Care (Signed)
  Problem: Safety: Goal: Ability to remain free from injury will improve Outcome: Progressing Note: Family at bedside to assist with patient safety.

## 2021-07-01 NOTE — Progress Notes (Signed)
[place] at [place] NAME: Bobby Peterson    MR#:  [id number]  DATE OF BIRTH:  September 18, 1940  SUBJECTIVE:  when I entered the room wife was trying to get patient walked to the bathroom. Bed alarm went off. Explained wife to call for help and not allow patient to walk by himself for safety reason. No respiratory distress. REVIEW OF SYSTEMS:   Review of Systems  Constitutional:  Negative for chills, fever and weight loss.  HENT:  Negative for ear discharge, ear pain and nosebleeds.   Eyes:  Negative for blurred vision, pain and discharge.  Respiratory:  Positive for shortness of breath. Negative for sputum production, wheezing and stridor.   Cardiovascular:  Negative for chest pain, palpitations, orthopnea and PND.  Gastrointestinal:  Negative for abdominal pain, diarrhea, nausea and vomiting.  Genitourinary:  Negative for frequency and urgency.  Musculoskeletal:  Negative for back pain and joint pain.  Neurological:  Positive for weakness. Negative for sensory change, speech change and focal weakness.  Psychiatric/Behavioral:  Negative for depression and hallucinations. The patient is not nervous/anxious.   Tolerating Diet:yes Tolerating PT: NO PT needs  DRUG ALLERGIES:   Allergies  Allergen Reactions   Fenofibrate     Other reaction(s): Headache   Lisinopril Cough   Niacin     Other reaction(s): Dizziness   Phenergan [Promethazine Hcl]     NVD   Simvastatin     Other reaction(s): Headache    VITALS:  Blood pressure 114/74, pulse (!) 108, temperature 97.8 F (36.6 C), temperature source Oral, resp. rate 20, height 5\' 5"  (1.651 m), weight 78.3 kg, SpO2 100 %.  PHYSICAL EXAMINATION:   Physical Exam  GENERAL:  80 y.o.-year-old patient lying in the bed with no acute distress.  HEENT: Head atraumatic, normocephalic. Oropharynx and nasopharynx clear.  LUNGS: decreased breath sounds bilaterally, no wheezing, rales, rhonchi. No use of  accessory muscles of respiration.  CARDIOVASCULAR: S1, S2 normal. No murmurs, rubs, or gallops.  ABDOMEN: Soft, nontender, nondistended. Bowel sounds present. No organomegaly or mass.  EXTREMITIES: No cyanosis, clubbing or edema b/l.    NEUROLOGIC: nonfocal, weak PSYCHIATRIC:  patient is alert and oriented x 3.  SKIN: No obvious rash, lesion, or ulcer.   LABORATORY PANEL:  CBC Recent Labs  Lab 06/30/21 0612  WBC 9.7  HGB 15.2  HCT 47.5  PLT 260     Chemistries  Recent Labs  Lab 06/28/21 1019 06/30/21 0612  NA 137  --   K 3.8  --   CL 102  --   CO2 27  --   GLUCOSE 136*  --   BUN 42*  --   CREATININE 2.01* 1.25*  CALCIUM 8.6*  --   AST 19  --   ALT 14  --   ALKPHOS 71  --   BILITOT 0.9  --     Cardiac Enzymes No results for input(s): TROPONINI in the last 168 hours. RADIOLOGY:  No results found. ASSESSMENT AND PLAN:  Bobby Peterson is a 80 y.o. male with medical history significant for COPD on home O2 at 2 L, diastolic CHF, CKD 3, DM, CAD, colovesical fistula, who tested positive for COVID 9 days prior who presents to the ED with a complaint of progressively worsening symptoms of cough and shortness of breath as well as generalized weakness.  He denies fever or chills.  Denies chest pain, nausea, vomiting or diarrhea.   CT abdomen and pelvis  Lung bases with multifocal pneumonia Fistulous communication between the mid sigmoid colon and bladder similar to 2019 with increased stranding consistent with cystitis and mild acute diverticulitis Bladder stone a 4 cm Nodular lesion liver    Pneumonia due to COVID-19 virus  Acute on chronic respiratory failure with hypoxia (HCC) -Chest CT showing multifocal pneumonia with positive COVID test 9 days prior --No strong evidence for sepsis or secondary bacterial infection --Patient received abx in the ER but will hold off for now --Remdesivir (3/5), methylprednisolone, albuterol, antitussives, vitamins --supplemental  oxygen   Colovesical fistula, chronic --UA unable to analyse due to fecal material --Holding off on antibiotics. Was recently treated with levaquin --stable so far    COPD (chronic obstructive pulmonary disease --Albuterol prn   Coronary artery disease --continue aspirin, metoprolol, pravastatin   Chronic diastolic heart failure  --euvolemic --continue lasix, metoprolol     Chronic kidney disease (CKD), stage III  --Slightly above baseline    Diabetes mellitus, type II (HCC) --sliding scale insulin --on metformin at home    Family communication :t wife in the room Consults : CODE STATUS: full DVT Prophylaxis :lovenox Level of care: Med-Surg Status is: Inpatient  Remains inpatient appropriate because: covid PNA        TOTAL TIME TAKING CARE OF THIS PATIENT: 25 minutes.  >50% time spent on counselling and coordination of care  Note: This dictation was prepared with Dragon dictation along with smaller phrase technology. Any transcriptional errors that result from this process are unintentional.  Fritzi Mandes M.D    [facility]   CC: Primary care physician; Virginia Crews, MD Patient ID: Bobby Peterson, male   DOB: [birth year], 80 y.o.   MRN: [id number]

## 2021-07-02 LAB — GLUCOSE, CAPILLARY
Glucose-Capillary: 109 mg/dL — ABNORMAL HIGH (ref 70–99)
Glucose-Capillary: 105 mg/dL — ABNORMAL HIGH (ref 70–99)
Glucose-Capillary: 135 mg/dL — ABNORMAL HIGH (ref 70–99)
Glucose-Capillary: 161 mg/dL — ABNORMAL HIGH (ref 70–99)

## 2021-07-02 MED ORDER — HALOPERIDOL LACTATE 5 MG/ML IJ SOLN
1.0000 mg | Freq: Four times a day (QID) | INTRAMUSCULAR | Status: DC | PRN
Start: 2021-07-02 — End: 2021-07-03
  Administered 2021-07-02: 1 mg via INTRAMUSCULAR
  Filled 2021-07-02: qty 1

## 2021-07-02 NOTE — Care Management Important Message (Signed)
Important Message  Patient Details  Name: Bobby Peterson MRN: [id number] Date of Birth: 1940/08/18   Medicare Important Message Given:  Yes  Patient is in an isolation room so I called his room [phone] to review the Important Message from Medicare with him. His wife, [name] answered and said he couldn't talk to good right now so I asked if I could review the Important Message from Medicare with her and she replied yes.  She said he has been wanting to discharge and was in agreement with the upcoming discharge. I wished him well and thanked her for her time.    Juliann Pulse A Taegen Delker 07/02/2021, 10:30 AM

## 2021-07-02 NOTE — Progress Notes (Signed)
[place] at [place] NAME: Bobby Peterson    MR#:  [id number]  DATE OF BIRTH:  July 01, 1941  SUBJECTIVE:  no respiratory distress. Wife at bedside. Patient overall doing okay. Patient asking when he can go home. REVIEW OF SYSTEMS:   Review of Systems  Constitutional:  Negative for chills, fever and weight loss.  HENT:  Negative for ear discharge, ear pain and nosebleeds.   Eyes:  Negative for blurred vision, pain and discharge.  Respiratory:  Positive for shortness of breath. Negative for sputum production, wheezing and stridor.   Cardiovascular:  Negative for chest pain, palpitations, orthopnea and PND.  Gastrointestinal:  Negative for abdominal pain, diarrhea, nausea and vomiting.  Genitourinary:  Negative for frequency and urgency.  Musculoskeletal:  Negative for back pain and joint pain.  Neurological:  Positive for weakness. Negative for sensory change, speech change and focal weakness.  Psychiatric/Behavioral:  Negative for depression and hallucinations. The patient is not nervous/anxious.   Tolerating Diet:yes Tolerating PT: NO PT needs  DRUG ALLERGIES:   Allergies  Allergen Reactions   Fenofibrate     Other reaction(s): Headache   Lisinopril Cough   Niacin     Other reaction(s): Dizziness   Phenergan [Promethazine Hcl]     NVD   Simvastatin     Other reaction(s): Headache    VITALS:  Blood pressure 105/67, pulse 97, temperature 97.7 F (36.5 C), temperature source Oral, resp. rate 20, height 5\' 5"  (1.651 m), weight 78.3 kg, SpO2 95 %.  PHYSICAL EXAMINATION:   Physical Exam  GENERAL:  80 y.o.-year-old patient lying in the bed with no acute distress.  HEENT: Head atraumatic, normocephalic. Oropharynx and nasopharynx clear.  LUNGS: decreased breath sounds bilaterally, no wheezing, rales, rhonchi. No use of accessory muscles of respiration.  CARDIOVASCULAR: S1, S2 normal. No murmurs, rubs, or gallops.  ABDOMEN: Soft,  nontender, nondistended. Bowel sounds present. No organomegaly or mass.  EXTREMITIES: No cyanosis, clubbing or edema b/l.    NEUROLOGIC: nonfocal, weak PSYCHIATRIC:  patient is alert and oriented x 3.  SKIN: No obvious rash, lesion, or ulcer.   LABORATORY PANEL:  CBC Recent Labs  Lab 06/30/21 0612  WBC 9.7  HGB 15.2  HCT 47.5  PLT 260     Chemistries  Recent Labs  Lab 06/28/21 1019 06/30/21 0612  NA 137  --   K 3.8  --   CL 102  --   CO2 27  --   GLUCOSE 136*  --   BUN 42*  --   CREATININE 2.01* 1.25*  CALCIUM 8.6*  --   AST 19  --   ALT 14  --   ALKPHOS 71  --   BILITOT 0.9  --     Cardiac Enzymes No results for input(s): TROPONINI in the last 168 hours. RADIOLOGY:  No results found. ASSESSMENT AND PLAN:  Bobby Peterson is a 80 y.o. male with medical history significant for COPD on home O2 at 2 L, diastolic CHF, CKD 3, DM, CAD, colovesical fistula, who tested positive for COVID 9 days prior who presents to the ED with a complaint of progressively worsening symptoms of cough and shortness of breath as well as generalized weakness.  He denies fever or chills.  Denies chest pain, nausea, vomiting or diarrhea.   CT abdomen and pelvis Lung bases with multifocal pneumonia Fistulous communication between the mid sigmoid colon and bladder similar to 2019 with increased stranding consistent with cystitis  and mild acute diverticulitis Bladder stone a 4 cm Nodular lesion liver    Pneumonia due to COVID-19 virus  Acute on chronic respiratory failure with hypoxia (HCC) -Chest CT showing multifocal pneumonia with positive COVID test 9 days prior --No strong evidence for sepsis or secondary bacterial infection --Patient received abx in the ER but will hold off for now --Remdesivir (4/5), po prednisone albuterol, antitussives, vitamins --supplemental oxygen  Colovesical fistula, chronic --UA unable to analyse due to fecal material --Holding off on antibiotics. Was  recently treated with levaquin --stable so far    COPD (chronic obstructive pulmonary disease --Albuterol prn   Coronary artery disease --continue aspirin, metoprolol, pravastatin   Chronic diastolic heart failure  --euvolemic --continue lasix, metoprolol     Chronic kidney disease (CKD), stage III  --Slightly above baseline    Diabetes mellitus, type II (HCC) --sliding scale insulin --on metformin at home    Family communication :t wife in the room Consults : CODE STATUS: full DVT Prophylaxis :lovenox Level of care: Med-Surg Status is: Inpatient  Remains inpatient appropriate because: covid PNA  discussed with patient and wife if remains stable will discharged home tomorrow      TOTAL TIME TAKING CARE OF THIS PATIENT: 25 minutes.  >50% time spent on counselling and coordination of care  Note: This dictation was prepared with Dragon dictation along with smaller phrase technology. Any transcriptional errors that result from this process are unintentional.  Fritzi Mandes M.D    [facility]   CC: Primary care physician; Virginia Crews, MD Patient ID: Bobby Peterson, male   DOB: May 25, 1941, 80 y.o.   MRN: [id number]

## 2021-07-02 NOTE — Plan of Care (Signed)
  Problem: Education: Goal: Knowledge of risk factors and measures for prevention of condition will improve Outcome: Progressing   Problem: Coping: Goal: Psychosocial and spiritual needs will be supported Outcome: Progressing   Problem: Respiratory: Goal: Will maintain a patent airway Outcome: Progressing   

## 2021-07-02 NOTE — Progress Notes (Addendum)
PT Cancellation Note  Patient Details Name: Bobby Peterson MRN: [id number] DOB: Mar 29, 1941   Cancelled Treatment:    Reason Eval/Treat Not Completed: Patient's level of consciousness;Fatigue/lethargy limiting ability to participate. Pt received upright in recliner earlier this morning. Pt unable to keep eyes open and appears to be falling asleep upright. Pt only nodding yes if asked if he was tired. Resistant to attempt standing with PT to return to bed for safer sleeping position. Pt repositioned to reclined state in recliner for pt comfort and safety. RN notified. Will re-attempt as available and appropriate.   [name] Caster. Fairly IV, PT, DPT Physical Therapist- [facility]  07/02/2021, 12:35 PM

## 2021-07-03 ENCOUNTER — Inpatient Hospital Stay: Payer: Medicare HMO

## 2021-07-03 LAB — CULTURE, BLOOD (ROUTINE X 2)
Culture: NO GROWTH
Culture: NO GROWTH

## 2021-07-03 LAB — GLUCOSE, CAPILLARY
Glucose-Capillary: 111 mg/dL — ABNORMAL HIGH (ref 70–99)
Glucose-Capillary: 119 mg/dL — ABNORMAL HIGH (ref 70–99)

## 2021-07-03 MED ORDER — PREDNISONE 20 MG PO TABS: ORAL_TABLET | ORAL | 0 refills | Status: AC

## 2021-07-03 NOTE — Progress Notes (Addendum)
Bobby Peterson to be D/C'd Home per MD order.  Discussed prescriptions and follow up appointments with the patient and wife. Prescriptions was eprescribed, medication list explained in detail.   Allergies as of 07/03/2021       Reactions   Fenofibrate    Other reaction(s): Headache   Lisinopril Cough   Niacin    Other reaction(s): Dizziness   Phenergan [promethazine Hcl]    NVD   Simvastatin    Other reaction(s): Headache        Medication List     TAKE these medications    albuterol 108 (90 Base) MCG/ACT inhaler Commonly known as: VENTOLIN HFA Inhale into the lungs every 6 (six) hours as needed for wheezing or shortness of breath.   aspirin 81 MG tablet Take 81 mg by mouth daily.   furosemide 40 MG tablet Commonly known as: LASIX TAKE 1 TABLET EVERY DAY   gabapentin 300 MG capsule Commonly known as: NEURONTIN TAKE 1 CAPSULE TWICE DAILY   ipratropium-albuterol 0.5-2.5 (3) MG/3ML Soln Commonly known as: DUONEB INHALE THE CONTENTS OF 1 VIAL VIA NEBULIZER FOUR TIMES DAILY AS NEEDED   metFORMIN 500 MG tablet Commonly known as: GLUCOPHAGE Take 500 mg by mouth 2 (two) times daily with a meal.   metoprolol succinate 100 MG 24 hr tablet Commonly known as: TOPROL-XL TAKE 1 AND 1/2 TABLETS DAILY. TAKE WITH OR IMMEDIATELY FOLLOWING A MEAL. What changed: See the new instructions.   nitrofurantoin (macrocrystal-monohydrate) 100 MG capsule Commonly known as: MACROBID TAKE 1 CAPSULE AT BEDTIME   omeprazole 40 MG capsule Commonly known as: PRILOSEC TAKE 1 CAPSULE DAILY What changed:  how much to take how to take this when to take this   OXYGEN Inhale 2 L/min into the lungs continuous.   pravastatin 20 MG tablet Commonly known as: PRAVACHOL TAKE 1 TABLET DAILY. PLEASE SCHEDULE OFFICE VISIT BEFORE ANY FUTURE REFILLS. What changed:  how much to take how to take this when to take this additional instructions   predniSONE 20 MG tablet Commonly known as:  DELTASONE Take 20 mg daily for 3 days Start taking on: July 04, 2021   tamsulosin 0.4 MG Caps capsule Commonly known as: FLOMAX TAKE 1 CAPSULE EVERY DAY   vitamin B-12 1000 MCG tablet Commonly known as: CYANOCOBALAMIN Take 1,000 mcg by mouth once a week. *Take on Monday*        Vitals:   07/03/21 0728 07/03/21 1144  BP: 114/76 139/71  Pulse: 89 78  Resp: 18 18  Temp: 98 F (36.7 C) 97.8 F (36.6 C)  SpO2: 97% 91%    Skin clean, dry and intact without evidence of skin break down, no evidence of skin tears noted. IV catheter discontinued intact. Site without signs and symptoms of complications. Dressing and pressure applied. Pt denies pain at this time. No complaints noted.  An After Visit Summary was printed and given to the patient. Patient escorted via [place], and D/C home via private auto.  Bobby Peterson

## 2021-07-03 NOTE — Discharge Summary (Signed)
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] Acute respiratory failure with hypoxia ([place]) [J96.01] Sepsis with acute hypoxic respiratory failure without septic shock, due to unspecified organism ([place]) [A41.9, R65.20, J96.01] Pneumonia due to COVID-19 virus [U07.1, J12.82] COVID-19 [U07.1]  DISCHARGE DIAGNOSIS:  acute on chronic hypoxic respiratory failure secondary to COVID pneumonia and COPD exacerbation  SECONDARY DIAGNOSIS:   Past Medical History:  Diagnosis Date   Acute respiratory failure with hypoxia ([place]) 12/03/2013   Overview:  Overview:  2 L O2 continuously  Last Assessment & Plan:  Continue 2 L O2 continuously    Atrial tachycardia (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 on home O2   COPD (chronic obstructive pulmonary disease) (HCC)    COPD (chronic obstructive pulmonary disease) ([place]) 12/03/2013   May fifth 2015 simple spirometry>> ratio 45%, FEV1 0.95 L (34% per day)    Coronary artery disease    Coronary atherosclerosis 07/08/2015   Dr. Ubaldo Glassing    Esophageal reflux 81/03/2992   Hernia, umbilical    Hypercholesteremia    Hypertension    Obesity    Renal cancer ([place])    a. s/p nephrectomy in 1980's   Smoking 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 pack years 05/10/2016    HOSPITAL COURSE:  Bobby Peterson is a 80 y.o. male with medical history significant for COPD on home O2 at 2 L, diastolic CHF, CKD 3, DM, CAD,  colovesical fistula, who tested positive for COVID 9 days prior who presents to the ED with a complaint of progressively worsening symptoms of cough and shortness of breath as well as generalized weakness.  He denies fever or chills.  Denies chest pain, nausea, vomiting or diarrhea.   CT abdomen and pelvis Lung bases with multifocal pneumonia Fistulous communication between the mid sigmoid colon and bladder similar to 2019 with increased stranding consistent with cystitis and mild acute diverticulitis Bladder stone a 4 cm Nodular lesion liver      Pneumonia due to COVID-19 virus  Acute on chronic respiratory failure with hypoxia (HCC) -Chest CT showing multifocal pneumonia with positive COVID test 9 days prior --No strong evidence for sepsis or secondary bacterial infection --Patient received abx in the ER but will hold off for now --Remdesivir (5/5), po prednisone albuterol, antitussives, vitamins --supplemental oxygen -- repeat chest x-ray today remains about the same. Patient overall remains at his baseline. He is eager to go home. Wife at bedside agrees with plan.   Colovesical fistula, chronic --UA unable to analyse due to fecal material --Holding off on antibiotics. Was recently treated with levaquin --stable so far    COPD (chronic obstructive pulmonary disease --Albuterol prn   Coronary artery disease --continue aspirin, metoprolol, pravastatin   Chronic diastolic heart failure  --euvolemic --continue lasix, metoprolol     Chronic kidney disease (CKD), stage III  --Slightly above baseline    Diabetes mellitus, type II (HCC) --sliding scale insulin --  on metformin at home   patient completed treatment for COVID pneumonia. He will be discharged to home. He will continue uses chronic home oxygen and follow-up with his primary care as outpatient   Family communication : wife in the room Consults : CODE STATUS: full DVT Prophylaxis :lovenox Level of care:  Med-Surg Status is: Inpatient          CONSULTS OBTAINED:    DRUG ALLERGIES:   Allergies  Allergen Reactions   Fenofibrate     Other reaction(s): Headache   Lisinopril Cough   Niacin     Other reaction(s): Dizziness   Phenergan [Promethazine Hcl]     NVD   Simvastatin     Other reaction(s): Headache    DISCHARGE MEDICATIONS:   Allergies as of 07/03/2021       Reactions   Fenofibrate    Other reaction(s): Headache   Lisinopril Cough   Niacin    Other reaction(s): Dizziness   Phenergan [promethazine Hcl]    NVD   Simvastatin    Other reaction(s): Headache        Medication List     TAKE these medications    albuterol 108 (90 Base) MCG/ACT inhaler Commonly known as: VENTOLIN HFA Inhale into the lungs every 6 (six) hours as needed for wheezing or shortness of breath.   aspirin 81 MG tablet Take 81 mg by mouth daily.   furosemide 40 MG tablet Commonly known as: LASIX TAKE 1 TABLET EVERY DAY   gabapentin 300 MG capsule Commonly known as: NEURONTIN TAKE 1 CAPSULE TWICE DAILY   ipratropium-albuterol 0.5-2.5 (3) MG/3ML Soln Commonly known as: DUONEB INHALE THE CONTENTS OF 1 VIAL VIA NEBULIZER FOUR TIMES DAILY AS NEEDED   metFORMIN 500 MG tablet Commonly known as: GLUCOPHAGE Take 500 mg by mouth 2 (two) times daily with a meal.   metoprolol succinate 100 MG 24 hr tablet Commonly known as: TOPROL-XL TAKE 1 AND 1/2 TABLETS DAILY. TAKE WITH OR IMMEDIATELY FOLLOWING A MEAL. What changed: See the new instructions.   nitrofurantoin (macrocrystal-monohydrate) 100 MG capsule Commonly known as: MACROBID TAKE 1 CAPSULE AT BEDTIME   omeprazole 40 MG capsule Commonly known as: PRILOSEC TAKE 1 CAPSULE DAILY What changed:  how much to take how to take this when to take this   OXYGEN Inhale 2 L/min into the lungs continuous.   pravastatin 20 MG tablet Commonly known as: PRAVACHOL TAKE 1 TABLET DAILY. PLEASE SCHEDULE OFFICE VISIT BEFORE ANY FUTURE  REFILLS. What changed:  how much to take how to take this when to take this additional instructions   predniSONE 20 MG tablet Commonly known as: DELTASONE Take 20 mg daily for 3 days Start taking on: July 04, 2021   tamsulosin 0.4 MG Caps capsule Commonly known as: FLOMAX TAKE 1 CAPSULE EVERY DAY   vitamin B-12 1000 MCG tablet Commonly known as: CYANOCOBALAMIN Take 1,000 mcg by mouth once a week. *Take on Monday*        If you experience worsening of your admission symptoms, develop shortness of breath, life threatening emergency, suicidal or homicidal thoughts you must seek medical attention immediately by calling 911 or calling your MD immediately  if symptoms less severe.  You Must read complete instructions/literature along with all the possible adverse reactions/side effects for all the Medicines you take and that have been prescribed to you. Take any new Medicines after you have completely understood and accept all the possible adverse reactions/side effects.   Please note  You were  cared for by a hospitalist during your hospital stay. If you have any questions about your discharge medications or the care you received while you were in the hospital after you are discharged, you can call the unit and asked to speak with the hospitalist on call if the hospitalist that took care of you is not available. Once you are discharged, your primary care physician will handle any further medical issues. Please note that NO REFILLS for any discharge medications will be authorized once you are discharged, as it is imperative that you return to your primary care physician (or establish a relationship with a primary care physician if you do not have one) for your aftercare needs so that they can reassess your need for medications and monitor your lab values. Today   SUBJECTIVE   some confusion last night. Received Haldol. This morning sitting on bedside commode denies any complaints mild  cough. No respiratory distress remains on 4 L nasal cannula oxygen which is chronic for him wife at bedside.  VITAL SIGNS:  Blood pressure 139/71, pulse 78, temperature 97.8 F (36.6 C), resp. rate 18, height 5\' 5"  (1.651 m), weight 78.3 kg, SpO2 91 %.  I/O:   Intake/Output Summary (Last 24 hours) at 07/03/2021 1231 Last data filed at 07/03/2021 0700 Gross per 24 hour  Intake 0 ml  Output 125 ml  Net -125 ml    PHYSICAL EXAMINATION:  GENERAL:  80 y.o.-year-old patient lying in the bed with no acute distress.  LUNGS:decreased breath sounds bilaterally, no wheezing, rales,rhonchi or crepitation. No use of accessory muscles of respiration.  CARDIOVASCULAR: S1, S2 normal. No murmurs, rubs, or gallops.  ABDOMEN: Soft, non-tender, non-distended. Bowel sounds present. No organomegaly or mass.  EXTREMITIES: No pedal edema, cyanosis, or clubbing.  NEUROLOGIC: non-focal PSYCHIATRIC:  patient is alert  SKIN: No obvious rash, lesion, or ulcer.   DATA REVIEW:   CBC  Recent Labs  Lab 06/30/21 0612  WBC 9.7  HGB 15.2  HCT 47.5  PLT 260    Chemistries  Recent Labs  Lab 06/28/21 1019 06/30/21 0612  NA 137  --   K 3.8  --   CL 102  --   CO2 27  --   GLUCOSE 136*  --   BUN 42*  --   CREATININE 2.01* 1.25*  CALCIUM 8.6*  --   AST 19  --   ALT 14  --   ALKPHOS 71  --   BILITOT 0.9  --     Microbiology Results   Recent Results (from the past 240 hour(s))  Resp Panel by RT-PCR (Flu A&B, Covid) Nasopharyngeal Swab     Status: Abnormal   Collection Time: 06/28/21 10:19 AM   Specimen: Nasopharyngeal Swab; Nasopharyngeal(NP) swabs in vial transport medium  Result Value Ref Range Status   SARS Coronavirus 2 by RT PCR POSITIVE (A) NEGATIVE Final    Comment: RESULT CALLED TO, READ BACK BY AND VERIFIED WITH: C/SAMANTHA HAMILTON 06/28/21 1157 AMK (NOTE) SARS-CoV-2 target nucleic acids are DETECTED.  The SARS-CoV-2 RNA is generally detectable in upper respiratory specimens during  the acute phase of infection. Positive results are indicative of the presence of the identified virus, but do not rule out bacterial infection or co-infection with other pathogens not detected by the test. Clinical correlation with patient history and other diagnostic information is necessary to determine patient infection status. The expected result is Negative.  Fact Sheet for Patients: EntrepreneurPulse.com.au  Fact Sheet for Healthcare Providers: IncredibleEmployment.be  This test is not yet approved or cleared by the [place] and  has been authorized for detection and/or diagnosis of SARS-CoV-2 by FDA under an Emergency Use Authorization (EUA).  This EUA will remain in effect (meaning this test ca n be used) for the duration of  the COVID-19 declaration under Section 564(b)(1) of the Act, 21 U.S.C. section 360bbb-3(b)(1), unless the authorization is terminated or revoked sooner.     Influenza A by PCR NEGATIVE NEGATIVE Final   Influenza B by PCR NEGATIVE NEGATIVE Final    Comment: (NOTE) The Xpert Xpress SARS-CoV-2/FLU/RSV plus assay is intended as an aid in the diagnosis of influenza from Nasopharyngeal swab specimens and should not be used as a sole basis for treatment. Nasal washings and aspirates are unacceptable for Xpert Xpress SARS-CoV-2/FLU/RSV testing.  Fact Sheet for Patients: EntrepreneurPulse.com.au  Fact Sheet for Healthcare Providers: IncredibleEmployment.be  This test is not yet approved or cleared by the [place] FDA and has been authorized for detection and/or diagnosis of SARS-CoV-2 by FDA under an Emergency Use Authorization (EUA). This EUA will remain in effect (meaning this test can be used) for the duration of the COVID-19 declaration under Section 564(b)(1) of the Act, 21 U.S.C. section 360bbb-3(b)(1), unless the authorization is terminated  or revoked.  Performed at [facility], [address] [place], [place] [address]   Culture, blood (routine x 2)     Status: None   Collection Time: 06/28/21  7:05 PM   Specimen: BLOOD  Result Value Ref Range Status   Specimen Description BLOOD BLOOD LEFT HAND  Final   Special Requests   Final    BOTTLES DRAWN AEROBIC AND ANAEROBIC Blood Culture results may not be optimal due to an inadequate volume of blood received in culture bottles   Culture   Final    NO GROWTH 5 DAYS Performed at [facility], [address], [place], [place] [address]    Report Status 07/03/2021 FINAL  Final  Culture, blood (routine x 2)     Status: None   Collection Time: 06/28/21  7:05 PM   Specimen: BLOOD  Result Value Ref Range Status   Specimen Description BLOOD RIGHT ANTECUBITAL  Final   Special Requests   Final    BOTTLES DRAWN AEROBIC AND ANAEROBIC Blood Culture results may not be optimal due to an inadequate volume of blood received in culture bottles   Culture   Final    NO GROWTH 5 DAYS Performed at [facility], [address], [place], [place] [address]    Report Status 07/03/2021 FINAL  Final  Urine Culture     Status: Abnormal   Collection Time: 06/28/21  7:05 PM   Specimen: Urine, Clean Catch  Result Value Ref Range Status   Specimen Description   Final    URINE, CLEAN CATCH Performed at [facility], [address], [place], [place] [address]    Special Requests   Final    NONE Performed at [facility], [address] [place], [place] [address]    Culture MULTIPLE SPECIES PRESENT, SUGGEST RECOLLECTION (A)  Final   Report Status 06/29/2021 FINAL  Final    RADIOLOGY:  No results found.   CODE STATUS:  Code Status History     Date Active Date Inactive Code Status Order ID Comments User Context   08/02/2018 1432 08/06/2018 1554 DNR [id number]  Saundra Shelling, MD ED   02/15/2018 0150 02/16/2018 1526 Full Code [id number]  Amelia Jo, MD Inpatient   04/21/2016 [phone] 04/21/2016 1703 Full Code [id number]  Holley Raring, NP ED   01/04/2015 1401 01/07/2015 1737 Full Code [id number]  Greggory Keen, MD Inpatient   01/03/2015 1503 01/04/2015 1401 Full Code [id number]  Christene Lye, MD Inpatient    Questions for Most Recent Historical Code Status (Order [id number])     Question Answer   In the event of cardiac or respiratory ARREST Do not call a "code blue"   In the event of cardiac or respiratory ARREST Do not perform Intubation, CPR, defibrillation or ACLS   In the event of cardiac or respiratory ARREST Use medication by any route, position, wound care, and other measures to relive pain and suffering. May use oxygen, suction and manual treatment of airway obstruction as needed for comfort.             TOTAL TIME TAKING CARE OF THIS PATIENT: 40 minutes.    Fritzi Mandes M.D  [facility]    CC: Primary care physician; Virginia Crews, MD

## 2021-07-03 NOTE — Progress Notes (Signed)
Patient continued to attempt to get out of bed despite educating patient about safety. Notified covering provider to obtain an order to assist with patient's restlessness.   An order was given for 1-2 mg of haldol IM. Given to the patient. Will monitor for patient safety.

## 2021-07-03 NOTE — Progress Notes (Signed)
Assessed patient many times after receiving 1 mg haldol IM. Patient has not attempted to get out of bed since having received the haldol. Will continue to monitor for safety.

## 2021-07-04 DIAGNOSIS — U071 COVID-19: Secondary | ICD-10-CM | POA: Diagnosis not present

## 2021-07-04 DIAGNOSIS — N3000 Acute cystitis without hematuria: Secondary | ICD-10-CM | POA: Diagnosis not present

## 2021-07-04 DIAGNOSIS — R0602 Shortness of breath: Secondary | ICD-10-CM | POA: Diagnosis not present

## 2021-07-04 DIAGNOSIS — R410 Disorientation, unspecified: Secondary | ICD-10-CM | POA: Diagnosis not present

## 2021-07-05 ENCOUNTER — Other Ambulatory Visit: Payer: Self-pay

## 2021-07-05 ENCOUNTER — Inpatient Hospital Stay
Admission: EM | Admit: 2021-07-05 | Discharge: 2021-08-01 | DRG: 871 | Disposition: E | Payer: Medicare HMO | Attending: Internal Medicine | Admitting: Internal Medicine

## 2021-07-05 ENCOUNTER — Emergency Department: Payer: Medicare HMO

## 2021-07-05 DIAGNOSIS — J9690 Respiratory failure, unspecified, unspecified whether with hypoxia or hypercapnia: Secondary | ICD-10-CM | POA: Diagnosis not present

## 2021-07-05 DIAGNOSIS — E1142 Type 2 diabetes mellitus with diabetic polyneuropathy: Secondary | ICD-10-CM | POA: Diagnosis present

## 2021-07-05 DIAGNOSIS — N400 Enlarged prostate without lower urinary tract symptoms: Secondary | ICD-10-CM | POA: Diagnosis present

## 2021-07-05 DIAGNOSIS — N1832 Chronic kidney disease, stage 3b: Secondary | ICD-10-CM | POA: Diagnosis present

## 2021-07-05 DIAGNOSIS — G9341 Metabolic encephalopathy: Secondary | ICD-10-CM | POA: Diagnosis not present

## 2021-07-05 DIAGNOSIS — E8809 Other disorders of plasma-protein metabolism, not elsewhere classified: Secondary | ICD-10-CM | POA: Diagnosis present

## 2021-07-05 DIAGNOSIS — E1122 Type 2 diabetes mellitus with diabetic chronic kidney disease: Secondary | ICD-10-CM | POA: Diagnosis present

## 2021-07-05 DIAGNOSIS — Z7189 Other specified counseling: Secondary | ICD-10-CM | POA: Diagnosis not present

## 2021-07-05 DIAGNOSIS — A4150 Gram-negative sepsis, unspecified: Principal | ICD-10-CM | POA: Diagnosis present

## 2021-07-05 DIAGNOSIS — R410 Disorientation, unspecified: Secondary | ICD-10-CM

## 2021-07-05 DIAGNOSIS — J9621 Acute and chronic respiratory failure with hypoxia: Secondary | ICD-10-CM | POA: Diagnosis present

## 2021-07-05 DIAGNOSIS — J690 Pneumonitis due to inhalation of food and vomit: Secondary | ICD-10-CM

## 2021-07-05 DIAGNOSIS — R0902 Hypoxemia: Secondary | ICD-10-CM | POA: Diagnosis present

## 2021-07-05 DIAGNOSIS — J9601 Acute respiratory failure with hypoxia: Secondary | ICD-10-CM | POA: Diagnosis not present

## 2021-07-05 DIAGNOSIS — E2740 Unspecified adrenocortical insufficiency: Secondary | ICD-10-CM | POA: Diagnosis present

## 2021-07-05 DIAGNOSIS — R6521 Severe sepsis with septic shock: Secondary | ICD-10-CM | POA: Diagnosis not present

## 2021-07-05 DIAGNOSIS — Z905 Acquired absence of kidney: Secondary | ICD-10-CM | POA: Diagnosis not present

## 2021-07-05 DIAGNOSIS — Z87891 Personal history of nicotine dependence: Secondary | ICD-10-CM | POA: Diagnosis not present

## 2021-07-05 DIAGNOSIS — F05 Delirium due to known physiological condition: Secondary | ICD-10-CM | POA: Diagnosis present

## 2021-07-05 DIAGNOSIS — N390 Urinary tract infection, site not specified: Secondary | ICD-10-CM | POA: Diagnosis present

## 2021-07-05 DIAGNOSIS — Z7984 Long term (current) use of oral hypoglycemic drugs: Secondary | ICD-10-CM

## 2021-07-05 DIAGNOSIS — N3000 Acute cystitis without hematuria: Secondary | ICD-10-CM | POA: Diagnosis present

## 2021-07-05 DIAGNOSIS — I5032 Chronic diastolic (congestive) heart failure: Secondary | ICD-10-CM | POA: Diagnosis present

## 2021-07-05 DIAGNOSIS — E1143 Type 2 diabetes mellitus with diabetic autonomic (poly)neuropathy: Secondary | ICD-10-CM | POA: Diagnosis not present

## 2021-07-05 DIAGNOSIS — R4182 Altered mental status, unspecified: Secondary | ICD-10-CM | POA: Diagnosis not present

## 2021-07-05 DIAGNOSIS — R918 Other nonspecific abnormal finding of lung field: Secondary | ICD-10-CM | POA: Diagnosis not present

## 2021-07-05 DIAGNOSIS — I130 Hypertensive heart and chronic kidney disease with heart failure and stage 1 through stage 4 chronic kidney disease, or unspecified chronic kidney disease: Secondary | ICD-10-CM | POA: Diagnosis present

## 2021-07-05 DIAGNOSIS — E7800 Pure hypercholesterolemia, unspecified: Secondary | ICD-10-CM | POA: Diagnosis present

## 2021-07-05 DIAGNOSIS — N179 Acute kidney failure, unspecified: Secondary | ICD-10-CM | POA: Diagnosis not present

## 2021-07-05 DIAGNOSIS — I2510 Atherosclerotic heart disease of native coronary artery without angina pectoris: Secondary | ICD-10-CM | POA: Diagnosis present

## 2021-07-05 DIAGNOSIS — Z9981 Dependence on supplemental oxygen: Secondary | ICD-10-CM

## 2021-07-05 DIAGNOSIS — Z85528 Personal history of other malignant neoplasm of kidney: Secondary | ICD-10-CM | POA: Diagnosis not present

## 2021-07-05 DIAGNOSIS — Z66 Do not resuscitate: Secondary | ICD-10-CM | POA: Diagnosis not present

## 2021-07-05 DIAGNOSIS — Z515 Encounter for palliative care: Secondary | ICD-10-CM | POA: Diagnosis not present

## 2021-07-05 DIAGNOSIS — R059 Cough, unspecified: Secondary | ICD-10-CM | POA: Diagnosis not present

## 2021-07-05 DIAGNOSIS — Z8249 Family history of ischemic heart disease and other diseases of the circulatory system: Secondary | ICD-10-CM

## 2021-07-05 DIAGNOSIS — N321 Vesicointestinal fistula: Secondary | ICD-10-CM | POA: Diagnosis not present

## 2021-07-05 DIAGNOSIS — I4891 Unspecified atrial fibrillation: Secondary | ICD-10-CM | POA: Diagnosis present

## 2021-07-05 DIAGNOSIS — Z888 Allergy status to other drugs, medicaments and biological substances status: Secondary | ICD-10-CM

## 2021-07-05 DIAGNOSIS — J449 Chronic obstructive pulmonary disease, unspecified: Secondary | ICD-10-CM | POA: Diagnosis present

## 2021-07-05 DIAGNOSIS — R0602 Shortness of breath: Secondary | ICD-10-CM | POA: Diagnosis not present

## 2021-07-05 DIAGNOSIS — R404 Transient alteration of awareness: Secondary | ICD-10-CM | POA: Diagnosis not present

## 2021-07-05 DIAGNOSIS — J441 Chronic obstructive pulmonary disease with (acute) exacerbation: Secondary | ICD-10-CM | POA: Diagnosis not present

## 2021-07-05 DIAGNOSIS — U071 COVID-19: Secondary | ICD-10-CM | POA: Diagnosis not present

## 2021-07-05 DIAGNOSIS — J9611 Chronic respiratory failure with hypoxia: Secondary | ICD-10-CM | POA: Diagnosis present

## 2021-07-05 DIAGNOSIS — J9811 Atelectasis: Secondary | ICD-10-CM | POA: Diagnosis not present

## 2021-07-05 DIAGNOSIS — K219 Gastro-esophageal reflux disease without esophagitis: Secondary | ICD-10-CM | POA: Diagnosis present

## 2021-07-05 DIAGNOSIS — Z79899 Other long term (current) drug therapy: Secondary | ICD-10-CM

## 2021-07-05 DIAGNOSIS — Z7982 Long term (current) use of aspirin: Secondary | ICD-10-CM

## 2021-07-05 DIAGNOSIS — A419 Sepsis, unspecified organism: Secondary | ICD-10-CM

## 2021-07-05 NOTE — Telephone Encounter (Signed)
Please see if Junie Panning or Dr Ky Barban or any provider can fit this patient in for hospital f/u this week.

## 2021-07-05 NOTE — Telephone Encounter (Signed)
Transition Care Management Follow-up Telephone Call Date of discharge and from where: [facility] 07/03/21  How have you been since you were released from the hospital? Doing OK, still with some psychosis, sits up to sleep Any questions or concerns? No, per daughter  Items Reviewed: Did the pt receive and understand the discharge instructions provided? Yes  Medications obtained and verified? Yes  Other? No  Any new allergies since your discharge? No  Dietary orders reviewed? No Do you have support at home? Yes   Home Care and Equipment/Supplies: Were home health services ordered? no   Functional Questionnaire: (I = Independent and D = Dependent) ADLs: I  Bathing/Dressing- I  Meal Prep- I  Eating- I  Maintaining continence- I  Transferring/Ambulation- I  Managing Meds- I  Follow up appointments reviewed:  PCP Hospital f/u appt confirmed? No  . [facility] f/u appt confirmed? Yes  Scheduled to see  09/02/20 Dr.Dees Are transportation arrangements needed? No  If their condition worsens, is the pt aware to call PCP or go to the Emergency Dept.? Yes Was the patient provided with contact information for the PCP's office or ED? Yes Was to pt encouraged to call back with questions or concerns? Yes

## 2021-07-05 NOTE — Telephone Encounter (Signed)
Appt scheduled for 07/07/21

## 2021-07-05 NOTE — ED Triage Notes (Signed)
Wife said pt urinated and deficated on self which is not normal and has confusion. Pt is alert and oriented at this time.

## 2021-07-06 ENCOUNTER — Inpatient Hospital Stay: Payer: Medicare HMO

## 2021-07-06 DIAGNOSIS — E1142 Type 2 diabetes mellitus with diabetic polyneuropathy: Secondary | ICD-10-CM | POA: Diagnosis present

## 2021-07-06 DIAGNOSIS — F05 Delirium due to known physiological condition: Secondary | ICD-10-CM | POA: Diagnosis present

## 2021-07-06 DIAGNOSIS — E1122 Type 2 diabetes mellitus with diabetic chronic kidney disease: Secondary | ICD-10-CM | POA: Diagnosis present

## 2021-07-06 DIAGNOSIS — N179 Acute kidney failure, unspecified: Secondary | ICD-10-CM | POA: Diagnosis present

## 2021-07-06 DIAGNOSIS — R6521 Severe sepsis with septic shock: Secondary | ICD-10-CM | POA: Diagnosis present

## 2021-07-06 DIAGNOSIS — J690 Pneumonitis due to inhalation of food and vomit: Secondary | ICD-10-CM | POA: Diagnosis not present

## 2021-07-06 DIAGNOSIS — Z87891 Personal history of nicotine dependence: Secondary | ICD-10-CM | POA: Diagnosis not present

## 2021-07-06 DIAGNOSIS — J9601 Acute respiratory failure with hypoxia: Secondary | ICD-10-CM | POA: Diagnosis not present

## 2021-07-06 DIAGNOSIS — R410 Disorientation, unspecified: Secondary | ICD-10-CM | POA: Diagnosis present

## 2021-07-06 DIAGNOSIS — R918 Other nonspecific abnormal finding of lung field: Secondary | ICD-10-CM | POA: Diagnosis not present

## 2021-07-06 DIAGNOSIS — Z85528 Personal history of other malignant neoplasm of kidney: Secondary | ICD-10-CM | POA: Diagnosis not present

## 2021-07-06 DIAGNOSIS — A4150 Gram-negative sepsis, unspecified: Secondary | ICD-10-CM | POA: Diagnosis present

## 2021-07-06 DIAGNOSIS — J9621 Acute and chronic respiratory failure with hypoxia: Secondary | ICD-10-CM | POA: Diagnosis not present

## 2021-07-06 DIAGNOSIS — Z905 Acquired absence of kidney: Secondary | ICD-10-CM | POA: Diagnosis not present

## 2021-07-06 DIAGNOSIS — Z515 Encounter for palliative care: Secondary | ICD-10-CM | POA: Diagnosis not present

## 2021-07-06 DIAGNOSIS — N390 Urinary tract infection, site not specified: Secondary | ICD-10-CM | POA: Diagnosis not present

## 2021-07-06 DIAGNOSIS — N1832 Chronic kidney disease, stage 3b: Secondary | ICD-10-CM | POA: Diagnosis present

## 2021-07-06 DIAGNOSIS — E2740 Unspecified adrenocortical insufficiency: Secondary | ICD-10-CM | POA: Diagnosis present

## 2021-07-06 DIAGNOSIS — G9341 Metabolic encephalopathy: Secondary | ICD-10-CM

## 2021-07-06 DIAGNOSIS — R0902 Hypoxemia: Secondary | ICD-10-CM | POA: Diagnosis not present

## 2021-07-06 DIAGNOSIS — J441 Chronic obstructive pulmonary disease with (acute) exacerbation: Secondary | ICD-10-CM | POA: Diagnosis not present

## 2021-07-06 DIAGNOSIS — I130 Hypertensive heart and chronic kidney disease with heart failure and stage 1 through stage 4 chronic kidney disease, or unspecified chronic kidney disease: Secondary | ICD-10-CM | POA: Diagnosis present

## 2021-07-06 DIAGNOSIS — I2510 Atherosclerotic heart disease of native coronary artery without angina pectoris: Secondary | ICD-10-CM | POA: Diagnosis present

## 2021-07-06 DIAGNOSIS — J449 Chronic obstructive pulmonary disease, unspecified: Secondary | ICD-10-CM | POA: Diagnosis not present

## 2021-07-06 DIAGNOSIS — J9690 Respiratory failure, unspecified, unspecified whether with hypoxia or hypercapnia: Secondary | ICD-10-CM | POA: Diagnosis not present

## 2021-07-06 DIAGNOSIS — E1143 Type 2 diabetes mellitus with diabetic autonomic (poly)neuropathy: Secondary | ICD-10-CM

## 2021-07-06 DIAGNOSIS — Z66 Do not resuscitate: Secondary | ICD-10-CM | POA: Diagnosis not present

## 2021-07-06 DIAGNOSIS — K219 Gastro-esophageal reflux disease without esophagitis: Secondary | ICD-10-CM | POA: Diagnosis present

## 2021-07-06 DIAGNOSIS — E8809 Other disorders of plasma-protein metabolism, not elsewhere classified: Secondary | ICD-10-CM | POA: Diagnosis present

## 2021-07-06 DIAGNOSIS — N321 Vesicointestinal fistula: Secondary | ICD-10-CM | POA: Diagnosis not present

## 2021-07-06 DIAGNOSIS — N3000 Acute cystitis without hematuria: Secondary | ICD-10-CM | POA: Diagnosis present

## 2021-07-06 DIAGNOSIS — I5032 Chronic diastolic (congestive) heart failure: Secondary | ICD-10-CM | POA: Diagnosis present

## 2021-07-06 LAB — BASIC METABOLIC PANEL
Anion gap: 8 (ref 5–15)
BUN: 44 mg/dL — ABNORMAL HIGH (ref 8–23)
CO2: 32 mmol/L (ref 22–32)
Calcium: 9.4 mg/dL (ref 8.9–10.3)
Chloride: 101 mmol/L (ref 98–111)
Creatinine, Ser: 1.43 mg/dL — ABNORMAL HIGH (ref 0.61–1.24)
GFR, Estimated: 49 mL/min — ABNORMAL LOW (ref >60)
Glucose, Bld: 159 mg/dL — ABNORMAL HIGH (ref 70–99)
Potassium: 4.7 mmol/L (ref 3.5–5.1)
Sodium: 141 mmol/L (ref 135–145)

## 2021-07-06 LAB — PROCALCITONIN: Procalcitonin: <0.1 ng/mL

## 2021-07-06 LAB — CBC
HCT: 43.3 % (ref 39.0–52.0)
Hemoglobin: 13.8 g/dL (ref 13.0–17.0)
MCH: 28.3 pg (ref 26.0–34.0)
MCHC: 31.9 g/dL (ref 30.0–36.0)
MCV: 88.7 fL (ref 80.0–100.0)
Platelets: 271 10*3/uL (ref 150–400)
RBC: 4.88 MIL/uL (ref 4.22–5.81)
RDW: 14.9 % (ref 11.5–15.5)
WBC: 14.6 10*3/uL — ABNORMAL HIGH (ref 4.0–10.5)
nRBC: 0 % (ref 0.0–0.2)

## 2021-07-06 LAB — TROPONIN I (HIGH SENSITIVITY)
Troponin I (High Sensitivity): 8 ng/L (ref <18)
Troponin I (High Sensitivity): 11 ng/L (ref <18)

## 2021-07-06 LAB — COMPREHENSIVE METABOLIC PANEL
ALT: 14 U/L (ref 0–44)
AST: 15 U/L (ref 15–41)
Albumin: 3.1 g/dL — ABNORMAL LOW (ref 3.5–5.0)
Alkaline Phosphatase: 64 U/L (ref 38–126)
Anion gap: 8 (ref 5–15)
BUN: 44 mg/dL — ABNORMAL HIGH (ref 8–23)
CO2: 28 mmol/L (ref 22–32)
Calcium: 9.4 mg/dL (ref 8.9–10.3)
Chloride: 101 mmol/L (ref 98–111)
Creatinine, Ser: 1.38 mg/dL — ABNORMAL HIGH (ref 0.61–1.24)
GFR, Estimated: 51 mL/min — ABNORMAL LOW (ref >60)
Glucose, Bld: 187 mg/dL — ABNORMAL HIGH (ref 70–99)
Potassium: 5 mmol/L (ref 3.5–5.1)
Sodium: 137 mmol/L (ref 135–145)
Total Bilirubin: 1 mg/dL (ref 0.3–1.2)
Total Protein: 6.3 g/dL — ABNORMAL LOW (ref 6.5–8.1)

## 2021-07-06 LAB — CBC WITH DIFFERENTIAL/PLATELET
Abs Immature Granulocytes: 0.13 10*3/uL — ABNORMAL HIGH (ref 0.00–0.07)
Basophils Absolute: 0 10*3/uL (ref 0.0–0.1)
Basophils Relative: 0 %
Eosinophils Absolute: 0 10*3/uL (ref 0.0–0.5)
Eosinophils Relative: 0 %
HCT: 43.7 % (ref 39.0–52.0)
Hemoglobin: 13.9 g/dL (ref 13.0–17.0)
Immature Granulocytes: 1 %
Lymphocytes Relative: 4 %
Lymphs Abs: 0.7 10*3/uL (ref 0.7–4.0)
MCH: 28.5 pg (ref 26.0–34.0)
MCHC: 31.8 g/dL (ref 30.0–36.0)
MCV: 89.7 fL (ref 80.0–100.0)
Monocytes Absolute: 1 10*3/uL (ref 0.1–1.0)
Monocytes Relative: 6 %
Neutro Abs: 14.5 10*3/uL — ABNORMAL HIGH (ref 1.7–7.7)
Neutrophils Relative %: 89 %
Platelets: 282 10*3/uL (ref 150–400)
RBC: 4.87 MIL/uL (ref 4.22–5.81)
RDW: 15.3 % (ref 11.5–15.5)
WBC: 16.5 10*3/uL — ABNORMAL HIGH (ref 4.0–10.5)
nRBC: 0 % (ref 0.0–0.2)

## 2021-07-06 LAB — URINALYSIS, COMPLETE (UACMP) WITH MICROSCOPIC
Bilirubin Urine: NEGATIVE
Glucose, UA: NEGATIVE mg/dL
Ketones, ur: NEGATIVE mg/dL
Nitrite: NEGATIVE
Protein, ur: 30 mg/dL — AB
Specific Gravity, Urine: 1.006 (ref 1.005–1.030)
WBC, UA: >50 WBC/hpf — ABNORMAL HIGH (ref 0–5)
pH: 8 (ref 5.0–8.0)

## 2021-07-06 LAB — PROTIME-INR
INR: 1.1 (ref 0.8–1.2)
Prothrombin Time: 14 seconds (ref 11.4–15.2)

## 2021-07-06 LAB — MAGNESIUM: Magnesium: 2.4 mg/dL (ref 1.7–2.4)

## 2021-07-06 LAB — TSH: TSH: 3.305 u[IU]/mL (ref 0.350–4.500)

## 2021-07-06 LAB — CORTISOL-AM, BLOOD: Cortisol - AM: 6.1 ug/dL — ABNORMAL LOW (ref 6.7–22.6)

## 2021-07-06 MED ORDER — TAMSULOSIN HCL 0.4 MG PO CAPS
0.4000 mg | ORAL_CAPSULE | Freq: Every day | ORAL | Status: DC
  Administered 2021-07-06 – 2021-07-08 (×3): 0.4 mg via ORAL
  Filled 2021-07-06 (×3): qty 1

## 2021-07-06 MED ORDER — SODIUM CHLORIDE 0.9 % IV SOLN: 1.0000 g | Freq: Once | INTRAVENOUS | Status: DC

## 2021-07-06 MED ORDER — METOPROLOL SUCCINATE ER 50 MG PO TB24
150.0000 mg | ORAL_TABLET | Freq: Every day | ORAL | Status: DC
  Administered 2021-07-06 – 2021-07-07 (×2): 150 mg via ORAL
  Filled 2021-07-06 (×2): qty 3

## 2021-07-06 MED ORDER — ALBUTEROL SULFATE HFA 108 (90 BASE) MCG/ACT IN AERS: 2.0000 | INHALATION_SPRAY | Freq: Four times a day (QID) | RESPIRATORY_TRACT | Status: DC | PRN

## 2021-07-06 MED ORDER — ONDANSETRON HCL 4 MG PO TABS: 4.0000 mg | ORAL_TABLET | Freq: Four times a day (QID) | ORAL | Status: DC | PRN

## 2021-07-06 MED ORDER — ASPIRIN EC 81 MG PO TBEC
81.0000 mg | DELAYED_RELEASE_TABLET | Freq: Every day | ORAL | Status: DC
  Administered 2021-07-06 – 2021-07-08 (×3): 81 mg via ORAL
  Filled 2021-07-06 (×3): qty 1

## 2021-07-06 MED ORDER — PANTOPRAZOLE SODIUM 40 MG PO TBEC
40.0000 mg | DELAYED_RELEASE_TABLET | Freq: Every day | ORAL | Status: DC
  Administered 2021-07-06 – 2021-07-08 (×3): 40 mg via ORAL
  Filled 2021-07-06 (×3): qty 1

## 2021-07-06 MED ORDER — ENOXAPARIN SODIUM 40 MG/0.4ML IJ SOSY
40.0000 mg | PREFILLED_SYRINGE | INTRAMUSCULAR | Status: DC
  Administered 2021-07-06 – 2021-07-08 (×3): 40 mg via SUBCUTANEOUS
  Filled 2021-07-06 (×3): qty 0.4

## 2021-07-06 MED ORDER — VITAMIN B-12 1000 MCG PO TABS: 1000.0000 ug | ORAL_TABLET | ORAL | Status: DC

## 2021-07-06 MED ORDER — ALBUTEROL SULFATE (2.5 MG/3ML) 0.083% IN NEBU: 2.5000 mg | INHALATION_SOLUTION | Freq: Four times a day (QID) | RESPIRATORY_TRACT | Status: DC | PRN

## 2021-07-06 MED ORDER — LACTATED RINGERS IV SOLN: INTRAVENOUS | Status: DC

## 2021-07-06 MED ORDER — MAGNESIUM HYDROXIDE 400 MG/5ML PO SUSP: 30.0000 mL | Freq: Every day | ORAL | Status: DC | PRN

## 2021-07-06 MED ORDER — GABAPENTIN 300 MG PO CAPS
ORAL_CAPSULE | ORAL | Status: AC
  Filled 2021-07-06: qty 1

## 2021-07-06 MED ORDER — TRAZODONE HCL 50 MG PO TABS: 25.0000 mg | ORAL_TABLET | Freq: Every evening | ORAL | Status: DC | PRN

## 2021-07-06 MED ORDER — ACETAMINOPHEN 325 MG PO TABS: 650.0000 mg | ORAL_TABLET | Freq: Four times a day (QID) | ORAL | Status: DC | PRN

## 2021-07-06 MED ORDER — ACETAMINOPHEN 650 MG RE SUPP
650.0000 mg | Freq: Four times a day (QID) | RECTAL | Status: DC | PRN
  Filled 2021-07-06: qty 1

## 2021-07-06 MED ORDER — GABAPENTIN 300 MG PO CAPS
300.0000 mg | ORAL_CAPSULE | Freq: Two times a day (BID) | ORAL | Status: DC
  Administered 2021-07-06 – 2021-07-08 (×5): 300 mg via ORAL
  Filled 2021-07-06 (×4): qty 1

## 2021-07-06 MED ORDER — PIPERACILLIN-TAZOBACTAM 3.375 G IVPB
3.3750 g | Freq: Three times a day (TID) | INTRAVENOUS | Status: DC
  Administered 2021-07-06 – 2021-07-08 (×7): 3.375 g via INTRAVENOUS
  Filled 2021-07-06 (×7): qty 50

## 2021-07-06 MED ORDER — PIPERACILLIN-TAZOBACTAM 3.375 G IVPB 30 MIN
3.3750 g | Freq: Once | INTRAVENOUS | Status: AC
  Administered 2021-07-06: 3.375 g via INTRAVENOUS
  Filled 2021-07-06: qty 50

## 2021-07-06 MED ORDER — SODIUM CHLORIDE 0.9 % IV SOLN: 1.0000 g | Freq: Two times a day (BID) | INTRAVENOUS | Status: DC

## 2021-07-06 MED ORDER — ONDANSETRON HCL 4 MG/2ML IJ SOLN: 4.0000 mg | Freq: Four times a day (QID) | INTRAMUSCULAR | Status: DC | PRN

## 2021-07-06 MED ORDER — PRAVASTATIN SODIUM 20 MG PO TABS
20.0000 mg | ORAL_TABLET | Freq: Every day | ORAL | Status: DC
  Administered 2021-07-06 – 2021-07-08 (×3): 20 mg via ORAL
  Filled 2021-07-06 (×3): qty 1

## 2021-07-06 MED ORDER — FUROSEMIDE 40 MG PO TABS
40.0000 mg | ORAL_TABLET | Freq: Every day | ORAL | Status: DC
  Administered 2021-07-06 – 2021-07-07 (×2): 40 mg via ORAL
  Filled 2021-07-06 (×2): qty 1

## 2021-07-06 MED ORDER — IPRATROPIUM-ALBUTEROL 0.5-2.5 (3) MG/3ML IN SOLN
3.0000 mL | Freq: Four times a day (QID) | RESPIRATORY_TRACT | Status: DC | PRN
  Administered 2021-07-06: 3 mL via RESPIRATORY_TRACT
  Filled 2021-07-06: qty 3

## 2021-07-06 NOTE — H&P (Signed)
Bobby Peterson   PATIENT NAME: Bobby Peterson    MR#:  [id number]  DATE OF BIRTH:  1941/02/24  DATE OF ADMISSION:  07/11/2021  PRIMARY CARE PHYSICIAN: Virginia Crews, MD   Patient is coming from: Home  REQUESTING/REFERRING PHYSICIAN: Rudene Re, MD  CHIEF COMPLAINT:   Chief Complaint  Patient presents with   Altered Mental Status    HISTORY OF PRESENT ILLNESS:  Bobby Peterson is a 80 y.o. Caucasian male with medical history significant for COPD on home O2 at 2 L/min, diastolic CHF, stage III chronic kidney disease, type II diabetes mellitus, coronary artery disease, colovesical fistula and recent COVID pneumonia and acute on chronic respiratory failure for which she was admitted here on 11/29 and discharged on 12/3.  He did fairly well after his discharge, per his wife but unfortunately became more confused over the last 24 hours and has developed visual hallucinations.  He had urinary and stool incontinence.  He did not have any reported chest pain or dyspnea or cough or wheezing.  No fever or chills.  No nausea or vomiting or abdominal pain were reported.   ED Course: Upon presentation to the emergency room, vital signs were within normal with a pulse ox of 97% on 2 L of O2 by nasal cannula.  Labs revealed BUN of 44 and creatinine of 1.38.  And previous levels with potassium 5 and total protein 6.3 and albumin of 3.1.  CBC showed leukocytosis 16.8.  UA was positive for UTI..  EKG as reviewed by me : EKG was suboptimal showed?  Atrial fibrillation with controlled tach response of 96.  We will be repeat it Imaging: Chest x-ray showed mild bibasilar atelectasis.  The patient was given IV Zosyn.  Will be admitted to a he will medical telemetry bed for further evaluation and management. PAST MEDICAL HISTORY:   Past Medical History:  Diagnosis Date   Acute respiratory failure with hypoxia ([place]) 12/03/2013   Overview:  Overview:  2 L O2 continuously  Last Assessment &  Plan:  Continue 2 L O2 continuously    Atrial tachycardia ([place]) 04/21/2016   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   BP (high blood pressure) 07/08/2015   Chronic diastolic CHF (congestive heart failure) ([facility])    Chronic diastolic heart failure ([place]) 07/08/2015   Chronic hypoxemic respiratory failure ([facility]) 12/03/2013   2 L O2 continuously    Chronic respiratory failure ([facility])    a. on home O2   COPD (chronic obstructive pulmonary disease) ([facility])    COPD (chronic obstructive pulmonary disease) ([place]) 12/03/2013   May fifth 2015 simple spirometry>> ratio 45%, FEV1 0.95 L (34% per day)    Coronary artery disease    Coronary atherosclerosis 07/08/2015   Dr. Ubaldo Glassing    Esophageal reflux 49/01/262   Hernia, umbilical    Hypercholesteremia    Hypertension    Obesity    Renal cancer ([place])    a. s/p nephrectomy in 1980's   Smoking 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 pack years 05/10/2016    PAST SURGICAL HISTORY:   Past Surgical History:  Procedure Laterality Date   CHOLECYSTECTOMY N/A 12/22/2014   Procedure: LAPAROSCOPIC CHOLECYSTECTOMY WITH INTRAOPERATIVE CHOLANGIOGRAM;  Surgeon: Christene Lye, MD;  Location: [facility] ORS;  Service: General;  Laterality: N/A;   COLONOSCOPY     ERCP N/A 01/05/2015   Procedure: ENDOSCOPIC RETROGRADE CHOLANGIOPANCREATOGRAPHY (ERCP);  Surgeon: Hulen Luster, MD;  [facility] ENDOSCOPY;  Service: Endoscopy;  Laterality: N/A;  ERCP N/A 02/19/2015   Procedure: ENDOSCOPIC RETROGRADE CHOLANGIOPANCREATOGRAPHY (ERCP);  Surgeon: Hulen Luster, MD;  Location: [facility] ENDOSCOPY;  Service: Gastroenterology;  Laterality: N/A;   GALLBLADDER SURGERY  11/2014   NEPHRECTOMY  1990   OMENTECTOMY  12/22/2014   Procedure: OMENTECTOMY;  Surgeon: Christene Lye, MD;  Location: [facility] ORS;  Service: General;;  Partial omentectomy   STENT REMOVAL     UMBILICAL HERNIA REPAIR N/A 12/22/2014   Procedure: HERNIA REPAIR UMBILICAL ADULT;  Surgeon: Christene Lye, MD;  Location: [facility] ORS;  Service: General;   Laterality: N/A;    SOCIAL HISTORY:   Social History   Tobacco Use   Smoking status: Former    Packs/day: 1.00    Years: 50.00    Pack years: 50.00    Types: Cigarettes    Quit date: 12/04/2003    Years since quitting: 17.6   Smokeless tobacco: Current    Types: Chew   Tobacco comments:    Has been chewing tobacco since quitting smoking in 2005.  Substance Use Topics   Alcohol use: No    FAMILY HISTORY:   Family History  Problem Relation Age of Onset   Hypertension Mother     DRUG ALLERGIES:   Allergies  Allergen Reactions   Fenofibrate     Other reaction(s): Headache   Lisinopril Cough   Niacin     Other reaction(s): Dizziness   Phenergan [Promethazine Hcl]     NVD   Simvastatin     Other reaction(s): Headache    REVIEW OF SYSTEMS:   ROS As per history of present illness. All pertinent systems were reviewed above. Constitutional, HEENT, cardiovascular, respiratory, GI, GU, musculoskeletal, neuro, psychiatric, endocrine, integumentary and hematologic systems were reviewed and are otherwise negative/unremarkable except for positive findings mentioned above in the HPI.   MEDICATIONS AT HOME:   Prior to Admission medications   Medication Sig Start Date End Date Taking? Authorizing Provider  Alcohol Swabs (DROPSAFE ALCOHOL PREP) 70 % PADS Apply topically. 04/30/21  Yes [provider]  aspirin 81 MG tablet Take 81 mg by mouth daily.   Yes [provider]  Blood Glucose Monitoring Suppl (TRUE METRIX METER) w/Device KIT  04/30/21  Yes [provider]  furosemide (LASIX) 40 MG tablet TAKE 1 TABLET EVERY DAY Patient taking differently: Take 40 mg by mouth daily. 10/06/20  Yes Bacigalupo, Dionne Bucy, MD  gabapentin (NEURONTIN) 300 MG capsule TAKE 1 CAPSULE TWICE DAILY Patient taking differently: Take 300 mg by mouth 2 (two) times daily. 05/17/21  Yes Bacigalupo, Dionne Bucy, MD  metFORMIN (GLUCOPHAGE) 500 MG tablet Take 500 mg by mouth 2 (two)  times daily with a meal.   Yes Bacigalupo, Dionne Bucy, MD  metoprolol succinate (TOPROL-XL) 100 MG 24 hr tablet TAKE 1 AND 1/2 TABLETS DAILY. TAKE WITH OR IMMEDIATELY FOLLOWING A MEAL. Patient taking differently: Take 150 mg by mouth daily. 04/13/21  Yes Bacigalupo, Dionne Bucy, MD  nitrofurantoin, macrocrystal-monohydrate, (MACROBID) 100 MG capsule TAKE 1 CAPSULE AT BEDTIME Patient taking differently: Take 100 mg by mouth at bedtime. 10/06/20  Yes Bacigalupo, Dionne Bucy, MD  omeprazole (PRILOSEC) 40 MG capsule TAKE 1 CAPSULE DAILY Patient taking differently: Take 40 mg by mouth daily. TAKE 1 CAPSULE DAILY 05/17/21  Yes Bacigalupo, Dionne Bucy, MD  OXYGEN Inhale 2 L/min into the lungs continuous.    Yes [provider]  pravastatin (PRAVACHOL) 20 MG tablet TAKE 1 TABLET DAILY. PLEASE SCHEDULE OFFICE VISIT BEFORE ANY FUTURE REFILLS. Patient taking  differently: Take 20 mg by mouth daily. 04/14/21  Yes Chrismon, Vickki Muff, PA-C  tamsulosin (FLOMAX) 0.4 MG CAPS capsule TAKE 1 CAPSULE EVERY DAY Patient taking differently: Take 0.4 mg by mouth daily. 04/07/21  Yes Virginia Crews, MD  TRUE METRIX BLOOD GLUCOSE TEST test strip SMARTSIG:Via Meter 04/30/21  Yes [provider]  TRUEplus Lancets 33G [place]  04/30/21  Yes [provider]  vitamin B-12 (CYANOCOBALAMIN) 1000 MCG tablet Take 1,000 mcg by mouth once a week. *Take on Monday*   Yes [provider]  albuterol (VENTOLIN HFA) 108 (90 Base) MCG/ACT inhaler Inhale into the lungs every 6 (six) hours as needed for wheezing or shortness of breath.    [provider]  ipratropium-albuterol (DUONEB) 0.5-2.5 (3) MG/3ML SOLN INHALE THE CONTENTS OF 1 VIAL VIA NEBULIZER FOUR TIMES DAILY AS NEEDED 10/03/20   Bacigalupo, Dionne Bucy, MD  predniSONE (DELTASONE) 20 MG tablet Take 20 mg daily for 3 days Patient not taking: Reported on 07/08/2021 07/04/21   Fritzi Mandes, MD      VITAL SIGNS:  Blood pressure 131/80, pulse 93, temperature 97.9  F (36.6 C), temperature source Oral, resp. rate 17, height $RemoveBe'5\' 5"'PLWcuFQXm$  (1.651 m), weight 74.8 kg, SpO2 97 %.  PHYSICAL EXAMINATION:  Physical Exam  GENERAL:  80 y.o.-year-old Caucasian male patient lying in the bed with no acute distress.  He was mildly confused but cooperative and follows instructions.  He was alert and oriented to place and person.[name] EYES: Pupils equal, round, reactive to light and accommodation. No scleral icterus. Extraocular muscles intact.  HEENT: Head atraumatic, normocephalic. Oropharynx and nasopharynx clear.  NECK:  Supple, no jugular venous distention. No thyroid enlargement, no tenderness.  LUNGS: Normal breath sounds bilaterally, no wheezing, rales,rhonchi or crepitation. No use of accessory muscles of respiration.  CARDIOVASCULAR: Regular rate and rhythm, S1, S2 normal. No murmurs, rubs, or gallops.  ABDOMEN: Soft, nondistended, nontender. Bowel sounds present. No organomegaly or mass.  EXTREMITIES: Trace bilateral extremity pitting l edema, with no cyanosis, or clubbing.  NEUROLOGIC: Cranial nerves II through XII are intact. Muscle strength 5/5 in all extremities. Sensation intact. Gait not checked.  PSYCHIATRIC: The patient is alert and oriented x 2 to place and person but not to time.  Normal affect and good eye contact. SKIN: No obvious rash, lesion, or ulcer.   LABORATORY PANEL:   CBC Recent Labs  Lab 07/20/2021 2315  WBC 16.5*  HGB 13.9  HCT 43.7  PLT 282   ------------------------------------------------------------------------------------------------------------------  Chemistries  Recent Labs  Lab 07/19/2021 2315  NA 137  K 5.0  CL 101  CO2 28  GLUCOSE 187*  BUN 44*  CREATININE 1.38*  CALCIUM 9.4  AST 15  ALT 14  ALKPHOS 64  BILITOT 1.0   ------------------------------------------------------------------------------------------------------------------  Cardiac Enzymes No results for input(s): TROPONINI in the last 168  hours. ------------------------------------------------------------------------------------------------------------------  RADIOLOGY:  DG Chest Portable 1 View  Result Date: 07/22/2021 CLINICAL DATA:  Cough and confusion, initial encounter EXAM: PORTABLE CHEST 1 VIEW COMPARISON:  07/03/2021 FINDINGS: Cardiac shadow is stable. Aortic calcifications are noted. Lungs are well aerated bilaterally with mild bibasilar atelectasis. No sizable effusion is seen. No bony abnormality is noted. IMPRESSION: Mild bibasilar atelectasis. Electronically Signed   By: Inez Catalina M.D.   On: 07/10/2021 23:35      IMPRESSION AND PLAN:  Principal Problem:   Sepsis due to gram-negative UTI ([place])  1.  Acute metabolic encephalopathy with delirium secondary to UTI. - The patient will  be admitted to a medical telemetry bed. - We will continue antibiotic therapy with IV Zosyn specially given his colovesical fistula. - We will follow urine and blood cultures. - The patient will be placed on hydration with IV normal saline. - We will monitor his mental status.  2.  Sepsis due to UTI. - Management as above.  3.  Type 2 diabetes mellitus with peripheral neuropathy and stage III chronic kidney disease. - The patient will be placed on supplement coverage with NovoLog. - We will continue Neurontin. - We will hold off metformin. - We will follow BMP.  4.  GERD. - We will continue PPI therapy.  5.  Dyslipidemia. - We will continue statin therapy.  6.  BPH. - We will continue Flomax.  7.  COPD without exacerbation with chronic respiratory failure. - We will continue his home O2.  8.  Recent COVID-19 pneumonia. - The patient has been adequately managed and it has been 9 days since he he came here to the hospital and was positive several days before.  He has no current respiratory symptoms.  I do not believe he needs isolation at this time.  9.  Abnormal EKG with questionable new onset A. fib. - Repeat EKG.   His rate is fairly controlled. - We will add a TSH and magnesium level.   DVT prophylaxis: Lovenox.  Code Status: full code.  Family Communication:  The plan of care was discussed in details with the patient (and family). I answered all questions. The patient agreed to proceed with the above mentioned plan. Further management will depend upon hospital course. Disposition Plan: Back to previous home environment Consults called: none.  All the records are reviewed and case discussed with ED provider.  Status is: Inpatient  Remains inpatient appropriate because:Ongoing diagnostic testing needed not appropriate for outpatient work up, Unsafe d/c plan, IV treatments appropriate due to intensity of illness or inability to take PO, and Inpatient level of care appropriate due to severity of illness   Dispo: The patient is from: Home              Anticipated d/c is to: Home              Patient currently is not medically stable to d/c.              Difficult to place patient: No  TOTAL TIME TAKING CARE OF THIS PATIENT: 55 minutes.     Christel Mormon M.D on 07/06/2021 at 2:05 AM  [facility]   From 7 PM-7 AM, contact night-coverage [email or web address]  CC: Primary care physician; Virginia Crews, MD

## 2021-07-06 NOTE — ED Provider Notes (Signed)
[facility] Emergency Department Provider Note  ____________________________________________  Time seen: Approximately 12:54 AM  I have reviewed the triage vital signs and the nursing notes.   HISTORY  Chief Complaint Altered Mental Status   HPI Bobby Peterson is a 80 y.o. male with a history of colovesicular fistula, CHF, hypertension, hyperlipidemia, CAD, COPD, chronic respiratory failure on 2 L nasal cannula who presents from home for confusion.  According to the wife patient has been more confused over the last 24 hours.  Has urinated and defecated on himself which is not normal.  No vomiting or diarrhea.  Patient is alert and oriented x2, denies chest pain, changes in his chronic shortness of breath, abdominal pain, nausea, vomiting, headache.  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 on home O2   COPD (chronic obstructive pulmonary disease) (HCC)    COPD (chronic obstructive pulmonary disease) ([place]) 12/03/2013   May fifth 2015 simple spirometry>> ratio 45%, FEV1 0.95 L (34% per day)    Coronary artery disease    Coronary atherosclerosis 07/08/2015   Dr. Ubaldo Glassing    Esophageal reflux 91/12/7559   Hernia, umbilical    Hypercholesteremia    Hypertension    Obesity    Renal cancer ([place])    a. 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   Coronary artery disease 07/08/2015   Chronic diastolic heart failure ([place]) 07/08/2015   Chronic kidney disease (CKD), stage III (moderate) ([place]) 07/08/2015   Esophageal reflux 07/08/2015   Hyperlipidemia associated with type 2 diabetes mellitus ([place]) 07/08/2015   Hypertension associated with diabetes ([place]) 07/08/2015   Calculus of kidney 07/08/2015   COPD (chronic obstructive pulmonary disease) ([place]) 12/03/2013   Chronic hypoxemic respiratory failure ([place]) 12/03/2013    Past Surgical History:  Procedure Laterality Date   CHOLECYSTECTOMY N/A 12/22/2014   Procedure: LAPAROSCOPIC CHOLECYSTECTOMY WITH INTRAOPERATIVE CHOLANGIOGRAM;  Surgeon: Christene Lye, MD;  Location: [facility] ORS;  Service: General;  Laterality: N/A;   COLONOSCOPY     ERCP N/A 01/05/2015   Procedure: ENDOSCOPIC RETROGRADE CHOLANGIOPANCREATOGRAPHY (ERCP);  Surgeon: Hulen Luster, MD;  Location: [facility] ENDOSCOPY;  Service: Endoscopy;  Laterality: N/A;   ERCP N/A 02/19/2015   Procedure: ENDOSCOPIC RETROGRADE CHOLANGIOPANCREATOGRAPHY (ERCP);  Surgeon: Hulen Luster, MD;  Location: [facility] ENDOSCOPY;  Service: Gastroenterology;  Laterality: N/A;   GALLBLADDER SURGERY  11/2014   NEPHRECTOMY  1990   OMENTECTOMY  12/22/2014   Procedure: OMENTECTOMY;  Surgeon: Christene Lye, MD;  Location: [facility] ORS;  Service: General;;  Partial omentectomy   STENT REMOVAL     UMBILICAL HERNIA REPAIR N/A 12/22/2014   Procedure: HERNIA REPAIR UMBILICAL ADULT;  Surgeon: Christene Lye, MD;  Location: [facility] ORS;  Service: General;  Laterality: N/A;    Prior to Admission medications   Medication Sig Start Date End Date  Taking? Authorizing Provider  Alcohol Swabs (DROPSAFE ALCOHOL PREP) 70 % PADS Apply topically. 04/30/21  Yes [provider]  aspirin 81 MG tablet Take 81 mg by mouth daily.   Yes [provider]  Blood Glucose Monitoring Suppl (TRUE METRIX METER) w/Device KIT  04/30/21  Yes [provider]  furosemide (LASIX) 40 MG tablet TAKE 1 TABLET EVERY DAY Patient taking differently: Take 40 mg by mouth daily. 10/06/20  Yes Bacigalupo, Dionne Bucy, MD  gabapentin (NEURONTIN) 300 MG capsule TAKE 1 CAPSULE TWICE DAILY Patient taking differently: Take 300 mg by mouth 2 (two) times daily. 05/17/21  Yes Bacigalupo, Dionne Bucy, MD  metFORMIN (GLUCOPHAGE) 500 MG tablet Take 500 mg by mouth 2 (two) times daily with a meal.   Yes Bacigalupo, Dionne Bucy, MD  metoprolol succinate (TOPROL-XL) 100 MG 24 hr tablet TAKE 1 AND 1/2 TABLETS DAILY. TAKE WITH OR IMMEDIATELY FOLLOWING A MEAL. Patient taking differently: Take 150 mg by mouth daily. 04/13/21  Yes Bacigalupo, Dionne Bucy, MD  nitrofurantoin, macrocrystal-monohydrate, (MACROBID) 100 MG capsule TAKE 1 CAPSULE AT BEDTIME Patient taking differently: Take 100 mg by mouth at bedtime. 10/06/20  Yes Bacigalupo, Dionne Bucy, MD  omeprazole (PRILOSEC) 40 MG capsule TAKE 1 CAPSULE DAILY Patient taking differently: Take 40 mg by mouth daily. TAKE 1 CAPSULE DAILY 05/17/21  Yes Bacigalupo, Dionne Bucy, MD  OXYGEN Inhale 2 L/min into the lungs continuous.    Yes [provider]  pravastatin (PRAVACHOL) 20 MG tablet TAKE 1 TABLET DAILY. PLEASE SCHEDULE OFFICE VISIT BEFORE ANY FUTURE REFILLS. Patient taking differently: Take 20 mg by mouth daily. 04/14/21  Yes Chrismon, Vickki Muff, PA-C  tamsulosin (FLOMAX) 0.4 MG CAPS capsule TAKE 1 CAPSULE EVERY DAY Patient taking differently: Take 0.4 mg by mouth daily. 04/07/21  Yes Virginia Crews, MD  TRUE METRIX BLOOD GLUCOSE TEST test strip SMARTSIG:Via Meter 04/30/21  Yes [provider]  TRUEplus Lancets 33G [place]   04/30/21  Yes [provider]  vitamin B-12 (CYANOCOBALAMIN) 1000 MCG tablet Take 1,000 mcg by mouth once a week. *Take on Monday*   Yes [provider]  albuterol (VENTOLIN HFA) 108 (90 Base) MCG/ACT inhaler Inhale into the lungs every 6 (six) hours as needed for wheezing or shortness of breath.    [provider]  ipratropium-albuterol (DUONEB) 0.5-2.5 (3) MG/3ML SOLN INHALE THE CONTENTS OF 1 VIAL VIA NEBULIZER FOUR TIMES DAILY AS NEEDED 10/03/20   Brita Romp, Dionne Bucy, MD  predniSONE (DELTASONE) 20 MG tablet Take 20 mg daily for 3 days Patient not taking: Reported on 07/20/2021 07/04/21   Fritzi Mandes, MD    Allergies Fenofibrate, Lisinopril, Niacin, Phenergan [promethazine hcl], and Simvastatin  Family History  Problem Relation Age of Onset   Hypertension Mother     Social History Social History   Tobacco Use   Smoking status: Former    Packs/day: 1.00    Years: 50.00    Pack years: 50.00    Types: Cigarettes    Quit date: 12/04/2003    Years since quitting: 17.6   Smokeless tobacco: Current    Types: Chew   Tobacco comments:  Has been chewing tobacco since quitting smoking in 2005.  Substance Use Topics   Alcohol use: No   Drug use: No    Review of Systems  Constitutional: Negative for fever. + confusion Eyes: Negative for visual changes. ENT: Negative for sore throat. Neck: No neck pain  Cardiovascular: Negative for chest pain. Respiratory: Negative for shortness of breath. Gastrointestinal: Negative for abdominal pain, vomiting or diarrhea. Genitourinary: Negative for dysuria. Musculoskeletal: Negative for back pain. Skin: Negative for rash. Neurological: Negative for headaches, weakness or numbness. Psych: No SI or HI  ____________________________________________   PHYSICAL EXAM:  VITAL SIGNS: ED Triage Vitals  Enc Vitals Group     BP 07/23/2021 2301 116/78     Pulse Rate 07/04/2021 2301 99     Resp 07/06/21 0000 20     Temp  07/17/2021 2301 97.9 F (36.6 C)     Temp Source 07/29/2021 2301 Oral     SpO2 07/30/2021 2259 92 %     Weight 07/01/2021 2303 165 lb (74.8 kg)     Height 07/21/2021 2303 $RemoveBefor'5\' 5"'[id number]$  (1.651 m)     Head Circumference --      Peak Flow --      Pain Score 07/22/2021 2303 0     Pain Loc --      Pain Edu? --      Excl. in [place]? --     Constitutional: Alert and oriented to self and place, answers to questions appropriately,  follows commands,  no apparent distress. HEENT:      Head: Normocephalic and atraumatic.         Eyes: Conjunctivae are normal. Sclera is non-icteric.       Mouth/Throat: Mucous membranes are moist.       Neck: Supple with no signs of meningismus. Cardiovascular: Regular rate and rhythm. No murmurs, gallops, or rubs. 2+ symmetrical distal pulses are present in all extremities. No JVD. Respiratory: Normal respiratory effort. Lungs are clear to auscultation bilaterally.  Gastrointestinal: Soft, non tender, and non distended with positive bowel sounds. No rebound or guarding. Genitourinary: No CVA tenderness. Musculoskeletal:  No edema, cyanosis, or erythema of extremities. Neurologic: Normal speech and language. Face is symmetric. Moving all extremities. No gross focal neurologic deficits are appreciated. Skin: Skin is warm, dry and intact. No rash noted. Psychiatric: Mood and affect are normal. Speech and behavior are normal.  ____________________________________________   LABS (all labs ordered are listed, but only abnormal results are displayed)  Labs Reviewed  URINALYSIS, COMPLETE (UACMP) WITH MICROSCOPIC - Abnormal; Notable for the following components:      Result Value   Color, Urine YELLOW (*)    APPearance TURBID (*)    Hgb urine dipstick LARGE (*)    Protein, ur 30 (*)    Leukocytes,Ua LARGE (*)    WBC, UA >50 (*)    Bacteria, UA MANY (*)    All other components within normal limits  CBC WITH DIFFERENTIAL/PLATELET - Abnormal; Notable for the following components:    WBC 16.5 (*)    Neutro Abs 14.5 (*)    Abs Immature Granulocytes 0.13 (*)    All other components within normal limits  COMPREHENSIVE METABOLIC PANEL - Abnormal; Notable for the following components:   Glucose, Bld 187 (*)    BUN 44 (*)    Creatinine, Ser 1.38 (*)    Total Protein 6.3 (*)    Albumin 3.1 (*)    GFR, Estimated 51 (*)    All other components  within normal limits  TROPONIN I (HIGH SENSITIVITY)  TROPONIN I (HIGH SENSITIVITY)   ____________________________________________  EKG  ED ECG REPORT I, Rudene Re, the attending physician, personally viewed and interpreted this ECG.  Sinus rhythm with a rate of 96, normal intervals, normal axis, no ST elevations, mild ST depressions laterally, unchanged from prior ____________________________________________  RADIOLOGY  I have personally reviewed the images performed during this visit and I agree with the Radiologist's read.   Interpretation by Radiologist:  DG Chest Portable 1 View  Result Date: 07/26/2021 CLINICAL DATA:  Cough and confusion, initial encounter EXAM: PORTABLE CHEST 1 VIEW COMPARISON:  07/03/2021 FINDINGS: Cardiac shadow is stable. Aortic calcifications are noted. Lungs are well aerated bilaterally with mild bibasilar atelectasis. No sizable effusion is seen. No bony abnormality is noted. IMPRESSION: Mild bibasilar atelectasis. Electronically Signed   By: Inez Catalina M.D.   On: 07/04/2021 23:35     ____________________________________________   PROCEDURES  Procedure(s) performed:yes .1-3 Lead EKG Interpretation Performed by: Rudene Re, MD Authorized by: Rudene Re, MD     Interpretation: abnormal     ECG rate assessment: normal     Rhythm: sinus rhythm     Ectopy: PAC     Conduction: normal     Critical Care performed:  None ____________________________________________   INITIAL IMPRESSION / ASSESSMENT AND PLAN / ED COURSE  80 y.o. male with a history of  colovesicular fistula, CHF, hypertension, hyperlipidemia, CAD, COPD, chronic respiratory failure on 2 L nasal cannula who presents from home for confusion and after urinating and defecating on himself today.  I tried several times to reach patient's wife over the phone unsuccessfully to get more history as patient here is alert and oriented x2, following commands, answering questions appropriately.  He requests a urinal when he needs to urinate and has not been incontinent here.  He does have a history of known colovesicular fistula making him more prone for urinary tract infections.  He does have a white count of 16.5 with a left shift and a UA that is positive but has no tachycardia, no fever. Lab work otherwise remains unchanged from baseline. Urine sent for culture. Will give IV zosyn.  _________________________ 1:09 AM on 07/06/2021 -----------------------------------  Spoke with patient's wife on the phone.  According to her since patient came home from the hospital on Saturday he has been getting progressively more confused, according to her has been seeing things that are not around the house.  He has had no vomiting or diarrhea as far as she can tell.  Therefore seems like patient has delirium in the setting of a UTI.  We will consult the hospitalist for admission      _____________________________________________ Please note:  Patient was evaluated in Emergency Department today for the symptoms described in the history of present illness. Patient was evaluated in the context of the global COVID-19 pandemic, which necessitated consideration that the patient might be at risk for infection with the SARS-CoV-2 virus that causes COVID-19. Institutional protocols and algorithms that pertain to the evaluation of patients at risk for COVID-19 are in a state of rapid change based on information released by regulatory bodies including the CDC and federal and state organizations. These policies and  algorithms were followed during the patient's care in the ED.  Some ED evaluations and interventions may be delayed as a result of limited staffing during the pandemic.   [place] Controlled Substance Database was reviewed by me. ____________________________________________   FINAL CLINICAL IMPRESSION(S) /  ED DIAGNOSES   Final diagnoses:  Delirium  Acute cystitis without hematuria      NEW MEDICATIONS STARTED DURING THIS VISIT:  ED Discharge Orders     None        Note:  This document was prepared using Dragon voice recognition software and may include unintentional dictation errors.    Alfred Levins, [place], MD 07/06/21 [place]

## 2021-07-06 NOTE — ED Notes (Signed)
RN placed pt on NRB at 15l/min.

## 2021-07-06 NOTE — ED Notes (Signed)
Pt placed in hospital bed with no issue.

## 2021-07-06 NOTE — Progress Notes (Addendum)
PROGRESS NOTE    Bobby Peterson  PYP:[id number] DOB: July 28, 1941 DOA: 07/08/2021 PCP: Virginia Crews, MD   Chief complaint.  Altered mental status. Brief Narrative:  Bobby Peterson is a 80 y.o. Caucasian male with medical history significant for COPD on home O2 at 2 L/min, diastolic CHF, stage III chronic kidney disease, type II diabetes mellitus, coronary artery disease, colovesical fistula and recent COVID pneumonia and acute on chronic respiratory failure for which she was admitted here on 11/29 and discharged on 12/3. She has been having altered mental status after discharge.  Per family, patient was confused, agitated, sometimes violent, urination and defecation all over the house. Upon arriving the hospital, he was a found to have abnormal urine, started Zosyn for possible UTI.   Assessment & Plan:   Principal Problem:   Sepsis due to gram-negative UTI ([place])  Acute metabolic encephalopathy with delirium.  Likely due to UTI. Urinary tract infection. Colovesical fistula. Blood culture was sent out.  Zosyn was started. Patient may have some baseline dementia.  Aspiration pneumonia of bilateral lower lobes. Acute on chronic hypoxemic respiratory failure. During my examination, patient does not clear his upper airway.  He coughed up food debris after eating. Chest x-ray showed bilateral infiltrates/atelectasis. Obtain speech therapy evaluation. Continue Zosyn. Patient developed significant hypoxemia on 2 L oxygen, currently on 4 L oxygen, will need increase to 8 L.  Continue to follow.  This is due to aspiration. Patient long-term prognosis is poor.  May consider palliative care/hospice after seen by speech therapy.  Type 2 diabetes mellitus with peripheral neuropathy and stage IIIa chronic kidney disease. Continue sliding scale insulin.  Addendum. 1250. Patient oxygenation is worse, which is due to aspiration.  Patient will be n.p.o. pending speech therapy evaluation.   Increased oxygen to 12 L.  Obtain RT eval and treat. Due to severity of aspiration, patient condition may be terminal.  Palliative care consult will be obtained.  Patient condition is critical, currently requiring heated high flow.  Oxygen saturations still low.  We will transfer to stepdown unit.  DVT prophylaxis: Lovenox Code Status: DNR Family Communication: Daughter and wife updated at bedside. Disposition Plan:    Status is: Inpatient  Remains inpatient appropriate because: Severity of disease and IV antibiotics.        I/O last 3 completed shifts: In: 42.9 [IV Piggyback:42.9] Out: -  No intake/output data recorded.     Consultants:  None  Procedures: None  Antimicrobials: Zosyn.  Subjective: Patient was eating when I walk in the room.  He has silent aspiration, he does not clear his airway.  He later coughed up food debris. He does not feel short of breath, he still on 2 L oxygen. No fever chills  No abdominal pain or nausea vomiting. No dysuria hematuria.  Objective: Vitals:   07/06/21 0700 07/06/21 0730 07/06/21 0800 07/06/21 0830  BP: 104/78 127/74 101/60 113/60  Pulse: 92 (!) 41 97 97  Resp: 17  19 (!) 21  Temp:      TempSrc:      SpO2: 95% (!) 74% 98% 100%  Weight:      Height:        Intake/Output Summary (Last 24 hours) at 07/06/2021 1058 Last data filed at 07/06/2021 0146 Gross per 24 hour  Intake 42.92 ml  Output --  Net 42.92 ml   Filed Weights   07/27/2021 2303  Weight: 74.8 kg    Examination:  General exam: Appears calm and comfortable  Respiratory system: Rhonchi in the right lower field. Respiratory effort normal. Cardiovascular system: S1 & S2 heard, RRR. No JVD, murmurs, rubs, gallops or clicks. No pedal edema. Gastrointestinal system: Abdomen is nondistended, soft and nontender. No organomegaly or masses felt. Normal bowel sounds heard. Central nervous system: Alert and oriented x3. No focal neurological  deficits. Extremities: Symmetric 5 x 5 power. Skin: No rashes, lesions or ulcers Psychiatry: Mood & affect appropriate.     Data Reviewed: I have personally reviewed following labs and imaging studies  CBC: Recent Labs  Lab 06/30/21 0612 07/29/2021 2315 07/06/21 0257  WBC 9.7 16.5* 14.6*  NEUTROABS  --  14.5*  --   HGB 15.2 13.9 13.8  HCT 47.5 43.7 43.3  MCV 89.1 89.7 88.7  PLT 260 282 709   Basic Metabolic Panel: Recent Labs  Lab 06/30/21 0612 07/21/2021 2315 07/06/21 0257  NA  --  137 141  K  --  5.0 4.7  CL  --  101 101  CO2  --  28 32  GLUCOSE  --  187* 159*  BUN  --  44* 44*  CREATININE 1.25* 1.38* 1.43*  CALCIUM  --  9.4 9.4  MG  --   --  2.4   GFR: Estimated Creatinine Clearance: 37.6 mL/min (A) (by C-G formula based on SCr of 1.43 mg/dL (H)). Liver Function Tests: Recent Labs  Lab 07/31/2021 2315  AST 15  ALT 14  ALKPHOS 64  BILITOT 1.0  PROT 6.3*  ALBUMIN 3.1*   No results for input(s): LIPASE, AMYLASE in the last 168 hours. No results for input(s): AMMONIA in the last 168 hours. Coagulation Profile: Recent Labs  Lab 07/06/21 0257  INR 1.1   Cardiac Enzymes: No results for input(s): CKTOTAL, CKMB, CKMBINDEX, TROPONINI in the last 168 hours. BNP (last 3 results) No results for input(s): PROBNP in the last 8760 hours. HbA1C: No results for input(s): HGBA1C in the last 72 hours. CBG: Recent Labs  Lab 07/02/21 1139 07/02/21 1638 07/02/21 2022 07/03/21 0729 07/03/21 1146  GLUCAP 105* 135* 161* 111* 119*   Lipid Profile: No results for input(s): CHOL, HDL, LDLCALC, TRIG, CHOLHDL, LDLDIRECT in the last 72 hours. Thyroid Function Tests: Recent Labs    07/06/21 0257  TSH 3.305   Anemia Panel: No results for input(s): VITAMINB12, FOLATE, FERRITIN, TIBC, IRON, RETICCTPCT in the last 72 hours. Sepsis Labs: Recent Labs  Lab 07/06/21 0257  PROCALCITON <0.10    Recent Results (from the past 240 hour(s))  Resp Panel by RT-PCR (Flu A&B,  Covid) Nasopharyngeal Swab     Status: Abnormal   Collection Time: 06/28/21 10:19 AM   Specimen: Nasopharyngeal Swab; Nasopharyngeal(NP) swabs in vial transport medium  Result Value Ref Range Status   SARS Coronavirus 2 by RT PCR POSITIVE (A) NEGATIVE Final    Comment: RESULT CALLED TO, READ BACK BY AND VERIFIED WITH: C/SAMANTHA HAMILTON 06/28/21 1157 AMK (NOTE) SARS-CoV-2 target nucleic acids are DETECTED.  The SARS-CoV-2 RNA is generally detectable in upper respiratory specimens during the acute phase of infection. Positive results are indicative of the presence of the identified virus, but do not rule out bacterial infection or co-infection with other pathogens not detected by the test. Clinical correlation with patient history and other diagnostic information is necessary to determine patient infection status. The expected result is Negative.  Fact Sheet for Patients: EntrepreneurPulse.com.au  Fact Sheet for Healthcare Providers: IncredibleEmployment.be  This test is not yet approved or cleared by the [place] FDA  and  has been authorized for detection and/or diagnosis of SARS-CoV-2 by FDA under an Emergency Use Authorization (EUA).  This EUA will remain in effect (meaning this test ca n be used) for the duration of  the COVID-19 declaration under Section 564(b)(1) of the Act, 21 U.S.C. section 360bbb-3(b)(1), unless the authorization is terminated or revoked sooner.     Influenza A by PCR NEGATIVE NEGATIVE Final   Influenza B by PCR NEGATIVE NEGATIVE Final    Comment: (NOTE) The Xpert Xpress SARS-CoV-2/FLU/RSV plus assay is intended as an aid in the diagnosis of influenza from Nasopharyngeal swab specimens and should not be used as a sole basis for treatment. Nasal washings and aspirates are unacceptable for Xpert Xpress SARS-CoV-2/FLU/RSV testing.  Fact Sheet for Patients: EntrepreneurPulse.com.au  Fact Sheet  for Healthcare Providers: IncredibleEmployment.be  This test is not yet approved or cleared by the [place] FDA and has been authorized for detection and/or diagnosis of SARS-CoV-2 by FDA under an Emergency Use Authorization (EUA). This EUA will remain in effect (meaning this test can be used) for the duration of the COVID-19 declaration under Section 564(b)(1) of the Act, 21 U.S.C. section 360bbb-3(b)(1), unless the authorization is terminated or revoked.  Performed at [facility], [address] [place], [place] [address]   Culture, blood (routine x 2)     Status: None   Collection Time: 06/28/21  7:05 PM   Specimen: BLOOD  Result Value Ref Range Status   Specimen Description BLOOD BLOOD LEFT HAND  Final   Special Requests   Final    BOTTLES DRAWN AEROBIC AND ANAEROBIC Blood Culture results may not be optimal due to an inadequate volume of blood received in culture bottles   Culture   Final    NO GROWTH 5 DAYS Performed at [facility], [address], [place], [place] [address]    Report Status 07/03/2021 FINAL  Final  Culture, blood (routine x 2)     Status: None   Collection Time: 06/28/21  7:05 PM   Specimen: BLOOD  Result Value Ref Range Status   Specimen Description BLOOD RIGHT ANTECUBITAL  Final   Special Requests   Final    BOTTLES DRAWN AEROBIC AND ANAEROBIC Blood Culture results may not be optimal due to an inadequate volume of blood received in culture bottles   Culture   Final    NO GROWTH 5 DAYS Performed at [facility], [address], [place], [place] [address]    Report Status 07/03/2021 FINAL  Final  Urine Culture     Status: Abnormal   Collection Time: 06/28/21  7:05 PM   Specimen: Urine, Clean Catch  Result Value Ref Range Status   Specimen Description   Final    URINE, CLEAN CATCH Performed at [facility], [address] [place], [place] [address]    Special Requests   Final     NONE Performed at [facility], [address] [place], [place] [address]    Culture MULTIPLE SPECIES PRESENT, SUGGEST RECOLLECTION (A)  Final   Report Status 06/29/2021 FINAL  Final  CULTURE, BLOOD (ROUTINE X 2) w Reflex to ID Panel     Status: None (Preliminary result)   Collection Time: 07/06/21  2:57 AM   Specimen: BLOOD  Result Value Ref Range Status   Specimen Description BLOOD RIGHT HAND  Final   Special Requests   Final    BOTTLES DRAWN AEROBIC ONLY Blood Culture adequate volume  Culture   Final    NO GROWTH < 12 HOURS Performed at [facility], [address] [place], [place] [address]    Report Status PENDING  Incomplete  CULTURE, BLOOD (ROUTINE X 2) w Reflex to ID Panel     Status: None (Preliminary result)   Collection Time: 07/06/21  2:58 AM   Specimen: Blood  Result Value Ref Range Status   Specimen Description BLOOD RIGTH [facility]  Final   Special Requests   Final    BOTTLES DRAWN AEROBIC AND ANAEROBIC Blood Culture adequate volume   Culture   Final    NO GROWTH < 12 HOURS Performed at [facility], [address] [place], [place] [address]    Report Status PENDING  Incomplete         Radiology Studies: DG Chest Portable 1 View  Result Date: 07/04/2021 CLINICAL DATA:  Cough and confusion, initial encounter EXAM: PORTABLE CHEST 1 VIEW COMPARISON:  07/03/2021 FINDINGS: Cardiac shadow is stable. Aortic calcifications are noted. Lungs are well aerated bilaterally with mild bibasilar atelectasis. No sizable effusion is seen. No bony abnormality is noted. IMPRESSION: Mild bibasilar atelectasis. Electronically Signed   By: Inez Catalina M.D.   On: 07/27/2021 23:35        Scheduled Meds:  aspirin EC  81 mg Oral Daily   enoxaparin (LOVENOX) injection  40 mg Subcutaneous Q24H   furosemide  40 mg Oral Daily   gabapentin       gabapentin  300 mg Oral BID   metoprolol succinate  150 mg Oral Daily   pantoprazole  40 mg Oral Daily    pravastatin  20 mg Oral Daily   tamsulosin  0.4 mg Oral Daily   [START ON 2021/07/17] vitamin B-12  1,000 mcg Oral Weekly   Continuous Infusions:  piperacillin-tazobactam 3.375 g (07/06/21 1022)     LOS: 0 days    Time spent: 32 minutes, no charge    Sharen Hones, MD [facility]   To contact the attending provider between 7A-7P or the covering provider during after hours 7P-7A, please log into the web site [email or web address] and access using universal [place] password for that web site. If you do not have the password, please call the hospital operator.  07/06/2021, 10:58 AM

## 2021-07-06 NOTE — ED Notes (Signed)
RN notified amy with resp.

## 2021-07-06 NOTE — ED Notes (Signed)
This RN attempted to call Tammy (caregiver).

## 2021-07-06 NOTE — Progress Notes (Signed)
OT Cancellation Note  Patient Details Name: IAM LIPSON MRN: [id number] DOB: 01-Aug-1941   Cancelled Treatment:    Reason Eval/Treat Not Completed: Medical issues which prohibited therapy. Order received and chart reviewed. RN reporting increased O2 needs, not appropriate for therapy at this time. Will hold and continue to follow to initiate services at later time/date as appropriate.   Dessie Coma, M.S. OTR/L  07/06/21, 11:57 AM  ascom [phone]

## 2021-07-06 NOTE — ED Notes (Signed)
Pt eating breakfast with assistance from daughter and wife.

## 2021-07-06 NOTE — Progress Notes (Signed)
Pharmacy Antibiotic Note  Bobby Peterson is a 80 y.o. male admitted on 07/06/2021 with UTI.  Pharmacy has been consulted for meropenem dosing.  Plan: Meropenem 1 gm IV Q12H ordered to continue on 12/6 @ 0700.   Height: 5\' 5"  (165.1 cm) Weight: 74.8 kg (165 lb) IBW/kg (Calculated) : 61.5  Temp (24hrs), Avg:97.9 F (36.6 C), Min:97.9 F (36.6 C), Max:97.9 F (36.6 C)  Recent Labs  Lab 06/30/21 0612 07/02/2021 2315  WBC 9.7 16.5*  CREATININE 1.25* 1.38*    Estimated Creatinine Clearance: 39 mL/min (A) (by C-G formula based on SCr of 1.38 mg/dL (H)).    Allergies  Allergen Reactions   Fenofibrate     Other reaction(s): Headache   Lisinopril Cough   Niacin     Other reaction(s): Dizziness   Phenergan [Promethazine Hcl]     NVD   Simvastatin     Other reaction(s): Headache    Antimicrobials this admission:   >>    >>   Dose adjustments this admission:   Microbiology results:  BCx:   UCx:    Sputum:    MRSA PCR:   Thank you for allowing pharmacy to be a part of this patient's care.  Kegan Mckeithan D 07/06/2021 1:58 AM

## 2021-07-06 NOTE — Progress Notes (Signed)
PT Cancellation Note  Patient Details Name: Bobby Peterson MRN: [id number] DOB: 1940-09-01   Cancelled Treatment:    Reason Eval/Treat Not Completed: Patient not medically ready. Respiratory issues with recent increased oxygen required. PT will hold and follow up as medically appropriate.   Minna Merritts, PT, MPT  Percell Locus 07/06/2021, 12:42 PM

## 2021-07-06 NOTE — ED Notes (Signed)
Pt

## 2021-07-06 NOTE — ED Notes (Signed)
RN placed pt on a venti mask at 4l/min due to pt 02 sat being in the low 80's. RN notified provider.

## 2021-07-06 NOTE — ED Notes (Addendum)
RN has continued to titrate venti  mask up. RN has maxed out venti mask of 10L/min and pt sat is at 86%. RN notified provider.

## 2021-07-07 ENCOUNTER — Inpatient Hospital Stay: Payer: Medicare HMO | Admitting: Family Medicine

## 2021-07-07 DIAGNOSIS — N390 Urinary tract infection, site not specified: Secondary | ICD-10-CM | POA: Diagnosis not present

## 2021-07-07 DIAGNOSIS — A4150 Gram-negative sepsis, unspecified: Principal | ICD-10-CM

## 2021-07-07 DIAGNOSIS — R0902 Hypoxemia: Secondary | ICD-10-CM | POA: Diagnosis present

## 2021-07-07 LAB — CBC WITH DIFFERENTIAL/PLATELET
Abs Immature Granulocytes: 0.16 10*3/uL — ABNORMAL HIGH (ref 0.00–0.07)
Basophils Absolute: 0 10*3/uL (ref 0.0–0.1)
Basophils Relative: 0 %
Eosinophils Absolute: 0.1 10*3/uL (ref 0.0–0.5)
Eosinophils Relative: 1 %
HCT: 45 % (ref 39.0–52.0)
Hemoglobin: 14.3 g/dL (ref 13.0–17.0)
Immature Granulocytes: 1 %
Lymphocytes Relative: 9 %
Lymphs Abs: 1.7 10*3/uL (ref 0.7–4.0)
MCH: 28.3 pg (ref 26.0–34.0)
MCHC: 31.8 g/dL (ref 30.0–36.0)
MCV: 88.9 fL (ref 80.0–100.0)
Monocytes Absolute: 1.7 10*3/uL — ABNORMAL HIGH (ref 0.1–1.0)
Monocytes Relative: 9 %
Neutro Abs: 14.5 10*3/uL — ABNORMAL HIGH (ref 1.7–7.7)
Neutrophils Relative %: 80 %
Platelets: 317 10*3/uL (ref 150–400)
RBC: 5.06 MIL/uL (ref 4.22–5.81)
RDW: 15.2 % (ref 11.5–15.5)
WBC: 18.2 10*3/uL — ABNORMAL HIGH (ref 4.0–10.5)
nRBC: 0 % (ref 0.0–0.2)

## 2021-07-07 LAB — BASIC METABOLIC PANEL
Anion gap: 11 (ref 5–15)
BUN: 38 mg/dL — ABNORMAL HIGH (ref 8–23)
CO2: 32 mmol/L (ref 22–32)
Calcium: 9 mg/dL (ref 8.9–10.3)
Chloride: 96 mmol/L — ABNORMAL LOW (ref 98–111)
Creatinine, Ser: 1.62 mg/dL — ABNORMAL HIGH (ref 0.61–1.24)
GFR, Estimated: 42 mL/min — ABNORMAL LOW (ref >60)
Glucose, Bld: 89 mg/dL (ref 70–99)
Potassium: 4.3 mmol/L (ref 3.5–5.1)
Sodium: 139 mmol/L (ref 135–145)

## 2021-07-07 LAB — MAGNESIUM: Magnesium: 2.3 mg/dL (ref 1.7–2.4)

## 2021-07-07 LAB — MRSA NEXT GEN BY PCR, NASAL: MRSA by PCR Next Gen: NOT DETECTED

## 2021-07-07 LAB — BRAIN NATRIURETIC PEPTIDE: B Natriuretic Peptide: 48.6 pg/mL (ref 0.0–100.0)

## 2021-07-07 MED ORDER — HYDROCORTISONE SOD SUC (PF) 100 MG IJ SOLR
100.0000 mg | Freq: Two times a day (BID) | INTRAMUSCULAR | Status: DC
  Administered 2021-07-07 – 2021-07-08 (×2): 100 mg via INTRAVENOUS
  Filled 2021-07-07 (×4): qty 2

## 2021-07-07 MED ORDER — MIDODRINE HCL 5 MG PO TABS
5.0000 mg | ORAL_TABLET | Freq: Three times a day (TID) | ORAL | Status: DC
  Administered 2021-07-07 – 2021-07-08 (×3): 5 mg via ORAL
  Filled 2021-07-07 (×3): qty 1

## 2021-07-07 NOTE — Progress Notes (Signed)
Pt confused and unable to answer admission questions. Called wife, [name], on cell and home phone with no answer. Will pass off to dayshift that admission needs to be done.

## 2021-07-07 NOTE — Evaluation (Signed)
Occupational Therapy Evaluation Patient Details Name: Bobby Peterson MRN: [id number] DOB: 05-12-1941 Today's Date: 07/07/2021   History of Present Illness Pt admitted for sepsis secondary to UTI with complaints of confusion. recent hospital stay from 11/29-12/3 for similar symptoms. History includes COPD on home O2 (2L baseline) however since dc, family reports he was unable to maintain sats at 2L. Other history includes legally blind, CHF, CKD, DM, and CAD.   Clinical Impression   Bobby Peterson was seen for OT evaluation this date. Prior to hospital admission, pt was Independent for mobility and ADLs. Pt lives with family in home c ramped entrance and 24/7 support available. Pt presents to acute OT demonstrating impaired ADL performance and functional mobility 2/2 decreased activity tolerance and functional strength deficits. Pt currently requires MIN A don/doff gown at bed level. MAX A for perihygiene at bed level, MIN A for L+R rolling for periaccess. MAX A for LBD at bed level, deferred OOB mobility 2/2 fatigue from bed level care. Pt would benefit from skilled OT to address noted impairments and functional limitations (see below for any additional details) in order to maximize safety and independence while minimizing falls risk and caregiver burden. Upon hospital discharge, recommend [place] with 24/7 support to maximize pt safety. Pt will require a hospital bed.        Recommendations for follow up therapy are one component of a multi-disciplinary discharge planning process, led by the attending physician.  Recommendations may be updated based on patient status, additional functional criteria and insurance authorization.   Follow Up Recommendations  Home health OT    Assistance Recommended at Discharge Frequent or constant Supervision/Assistance  Functional Status Assessment  Patient has had a recent decline in their functional status and demonstrates the ability to make significant  improvements in function in a reasonable and predictable amount of time.  Equipment Recommendations  BSC/3in1;Hospital bed    Recommendations for Other Services       Precautions / Restrictions Precautions Precautions: Fall Restrictions Weight Bearing Restrictions: No      Mobility Bed Mobility Overal bed mobility: Needs Assistance Bed Mobility: Rolling Rolling: Min assist       General bed mobility comments: assist to maintain sidelying    Transfers       General transfer comment: deferred 2/2 fatigue from pericare          ADL either performed or assessed with clinical judgement   ADL Overall ADL's : Needs assistance/impaired                                       General ADL Comments: MIN A don/doff gown at bed level. MAX A for perihygiene at bed level, MIN A for L+R rolling for periaccess. MAX A for LBD at bed level, deferred OOB mobility 2/2 fatigue from bed level care      Pertinent Vitals/Pain Pain Assessment: No/denies pain     Hand Dominance     Extremity/Trunk Assessment Upper Extremity Assessment Upper Extremity Assessment: Overall WFL for tasks assessed   Lower Extremity Assessment Lower Extremity Assessment: Generalized weakness       Communication Communication Communication: HOH   Cognition Arousal/Alertness: Awake/alert Behavior During Therapy: WFL for tasks assessed/performed Overall Cognitive Status: Impaired/Different from baseline Area of Impairment: Following commands;Safety/judgement  Following Commands: Follows one step commands consistently Safety/Judgement: Decreased awareness of safety     General Comments: unaware of BM     General Comments  SpO2 100% on 45L HFNC    Exercises Exercises: Other exercises Other Exercises Other Exercises: Pt educated re: OT role, DME recs, d/c recs, falls prevention, ECS Other Exercises: LBD, toileting, rolling, UBD   Shoulder  Instructions      Home Living Family/patient expects to be discharged to:: Private residence Living Arrangements: Spouse/significant other Available Help at Discharge: Family;Available 24 hours/day Type of Home: House Home Access: Ramped entrance     Home Layout: One level     Bathroom Shower/Tub: Teacher, early years/pre: Standard     Home Equipment: Conservation officer, nature (2 wheels);BSC/3in1;Shower seat;Cane - single point          Prior Functioning/Environment Prior Level of Function : Independent/Modified Independent             Mobility Comments: Per wife, pt amb with no AD or assist in household despite being legally blind. ADLs Comments: Wife reports he is independent with bathing, dressing, feeding. Since last admission, pt has been increasingly confused and urinating/deficating in the home        OT Problem List: Decreased strength;Decreased range of motion;Decreased activity tolerance;Impaired balance (sitting and/or standing);Decreased safety awareness;Cardiopulmonary status limiting activity      OT Treatment/Interventions: Self-care/ADL training;Therapeutic exercise;Energy conservation;DME and/or AE instruction;Therapeutic activities;Patient/family education;Balance training    OT Goals(Current goals can be found in the care plan section) Acute Rehab OT Goals Patient Stated Goal: to feel better OT Goal Formulation: With patient/family Time For Goal Achievement: 07/21/21 Potential to Achieve Goals: Good ADL Goals Pt Will Perform Grooming: with set-up;with supervision;sitting (will tolerate >8 min) Pt Will Perform Lower Body Dressing: with min assist;sit to/from stand Pt Will Transfer to Toilet: with min guard assist;stand pivot transfer;bedside commode  OT Frequency: Min 2X/week   Barriers to D/C:            Co-evaluation              AM-PAC OT "6 Clicks" Daily Activity     Outcome Measure Help from another person eating meals?: A  Little Help from another person taking care of personal grooming?: A Little Help from another person toileting, which includes using toliet, bedpan, or urinal?: A Lot Help from another person bathing (including washing, rinsing, drying)?: A Lot Help from another person to put on and taking off regular upper body clothing?: A Little Help from another person to put on and taking off regular lower body clothing?: A Lot 6 Click Score: 15   End of Session Equipment Utilized During Treatment: Oxygen Nurse Communication: Mobility status  Activity Tolerance: Patient tolerated treatment well Patient left: in bed;with call bell/phone within reach;with bed alarm set;with family/visitor present  OT Visit Diagnosis: Other abnormalities of gait and mobility (R26.89)                Time: 4650-3546 OT Time Calculation (min): 19 min Charges:  OT General Charges $OT Visit: 1 Visit OT Evaluation $OT Eval Low Complexity: 1 Low OT Treatments $Self Care/Home Management : 8-22 mins  Dessie Coma, M.S. OTR/L  07/07/21, 1:46 PM  ascom [phone]

## 2021-07-07 NOTE — TOC Initial Note (Signed)
Transition of Care [facility]) - Initial/Assessment Note    Patient Details  Name: Bobby Peterson MRN: [id number] Date of Birth: 02-25-1941  Transition of Care [facility]) CM/SW Contact:    Shelbie Hutching, RN Phone Number: 07/07/2021, 9:49 AM  Clinical Narrative:                 Patient admitted to the hospital acute respiratory failure and delirium.  Patient tested positive for COVID on 11/28 and was discharged from the hospital on 12/3.  RNCM met with patient and patient's wife and daughter at the bedside in the emergency department.  Patient is from home with his wife, independent at baseline, wears chronic oxygen 2L [place] from [place].  Wife and daughter report that when patient was discharged they did not feel that he was ready to go home, he was still very delirious at home.  Daughter reports that patient was urinating  all over the house, incontinent of stool, he was hard to redirect, and he was not listening to his wife.   Patient is requiring HFNC 45L/min to maintain oxygen saturations. Patient's daughter and wife say that the physician mentioned yesterday that the patient may be appropriate for hospice, and if that is the case if he only has a couple of months to live they want to take him home with hospice.   Hospice agency of choice is [facility].  Palliative care has been consulted and speech eval is pending.   Sonia Baller with [facility] given tentative referral for hospice at home.  Patient will need equipment such as hospital bed and over the bed table.    Expected Discharge Plan: Home w Hospice Care Barriers to Discharge: Continued Medical Work up   Patient Goals and CMS Choice Patient states their goals for this hospitalization and ongoing recovery are:: Family is interested in home with hospice CMS Medicare.gov Compare Post Acute Care list provided to:: Patient Represenative (must comment) Choice offered to / list presented to : Spouse, Adult Children  Expected Discharge Plan and  Services Expected Discharge Plan: [place]   Discharge Planning Services: CM Consult Post Acute Care Choice: Hospice Living arrangements for the past 2 months: Single Family Home                                      Prior Living Arrangements/Services Living arrangements for the past 2 months: Single Family Home Lives with:: Spouse Patient language and need for interpreter reviewed:: Yes Do you feel safe going back to the place where you live?: Yes      Need for Family Participation in Patient Care: Yes (Comment) (COVID) Care giver support system in place?: Yes (comment) (wife and daughter)   Criminal Activity/Legal Involvement Pertinent to Current Situation/Hospitalization: No - Comment as needed  Activities of Daily Living      Permission Sought/Granted Permission sought to share information with : Case Freight forwarder, Customer service manager, Family Supports Permission granted to share information with : Yes, Verbal Permission Granted  Share Information with NAME: Harnoor Kohles  Permission granted to share info w AGENCY: [place] granted to share info w Relationship: spouse  Permission granted to share info w Contact Information: [phone]  Emotional Assessment Appearance:: Appears stated age Attitude/Demeanor/Rapport: Unable to Assess Affect (typically observed): Other (comment) (confused)   Alcohol / Substance Use: Not Applicable Psych Involvement: No (comment)  Admission diagnosis:  Sepsis due to gram-negative UTI ([place]) [A41.50, N39.0] Acute on chronic respiratory failure with hypoxemia (HCC) [J96.21] Patient Active Problem List   Diagnosis Date Noted   Sepsis due to gram-negative UTI ([place]) 07/06/2021   Acute on chronic respiratory failure with hypoxemia ([place]) 07/06/2021   Pneumonia due to COVID-19 virus 06/28/2021   Diabetes mellitus, type II ([place]) 05/31/2021   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   Acute on chronic  respiratory failure with hypoxia ([place]) 02/14/2018   Gouty arthritis 08/11/2017   Smoking 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 pack years 05/10/2016   Sepsis ([place])    Allergic rhinitis 07/08/2015   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   Coronary artery disease 07/08/2015   Chronic diastolic heart failure ([place]) 07/08/2015   Chronic kidney disease (CKD), stage III (moderate) ([place]) 07/08/2015   Esophageal reflux 07/08/2015   Hyperlipidemia associated with type 2 diabetes mellitus ([place]) 07/08/2015   Hypertension associated with diabetes ([place]) 07/08/2015   Calculus of kidney 07/08/2015   COPD (chronic obstructive pulmonary disease) ([place]) 12/03/2013   Aspiration pneumonia of both lower lobes due to gastric secretions ([place]) 12/03/2013   Chronic hypoxemic respiratory failure ([place]) 12/03/2013   PCP:  Virginia Crews, MD Pharmacy:   [facility] [address] ([address]), [place]) [place] [address] Phone: [phone] Fax: [phone]  [facility] [place], [place] [address] Phone: [phone] Fax: [phone]     Social Determinants of Health (SDOH) Interventions    Readmission Risk Interventions Readmission Risk Prevention Plan 07/07/2021  Transportation Screening Complete  PCP or Specialist Appt within 5-7 Days Complete  Home Care Screening Complete  Medication Review (RN CM) Complete  Some recent data might be hidden

## 2021-07-07 NOTE — Evaluation (Signed)
Clinical/Bedside Swallow Evaluation Patient Details  Name: Bobby Peterson MRN: [id number] Date of Birth: 1941-01-19  Today's Date: 07/07/2021 Time: SLP Start Time (ACUTE ONLY): 1140 SLP Stop Time (ACUTE ONLY): 1158 SLP Time Calculation (min) (ACUTE ONLY): 18 min  Past Medical History:  Past Medical History:  Diagnosis Date   Acute respiratory failure with hypoxia ([place]) 12/03/2013   Overview:  Overview:  2 L O2 continuously  Last Assessment & Plan:  Continue 2 L O2 continuously    Atrial tachycardia ([place]) 04/21/2016   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   BP (high blood pressure) 07/08/2015   Chronic diastolic CHF (congestive heart failure) (HCC)    Chronic diastolic heart failure ([place]) 07/08/2015   Chronic hypoxemic respiratory failure (HCC) 12/03/2013   2 L O2 continuously    Chronic respiratory failure (HCC)    a. on home O2   COPD (chronic obstructive pulmonary disease) (HCC)    COPD (chronic obstructive pulmonary disease) ([place]) 12/03/2013   May fifth 2015 simple spirometry>> ratio 45%, FEV1 0.95 L (34% per day)    Coronary artery disease    Coronary atherosclerosis 07/08/2015   Dr. Ubaldo Glassing    Esophageal reflux 52/01/7823   Hernia, umbilical    Hypercholesteremia    Hypertension    Obesity    Renal cancer ([place])    a. s/p nephrectomy in 1980's   Smoking 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 pack years 05/10/2016   Past Surgical History:  Past Surgical History:  Procedure Laterality Date   CHOLECYSTECTOMY N/A 12/22/2014   Procedure: LAPAROSCOPIC CHOLECYSTECTOMY WITH INTRAOPERATIVE CHOLANGIOGRAM;  Surgeon: Christene Lye, MD;  Location: [facility] ORS;  Service: General;  Laterality: N/A;   COLONOSCOPY     ERCP N/A 01/05/2015   Procedure: ENDOSCOPIC RETROGRADE CHOLANGIOPANCREATOGRAPHY (ERCP);  Surgeon: Hulen Luster, MD;  Location: [facility] ENDOSCOPY;  Service: Endoscopy;  Laterality: N/A;   ERCP N/A 02/19/2015   Procedure: ENDOSCOPIC RETROGRADE CHOLANGIOPANCREATOGRAPHY (ERCP);  Surgeon: Hulen Luster, MD;  Location:  [facility] ENDOSCOPY;  Service: Gastroenterology;  Laterality: N/A;   GALLBLADDER SURGERY  11/2014   NEPHRECTOMY  1990   OMENTECTOMY  12/22/2014   Procedure: OMENTECTOMY;  Surgeon: Christene Lye, MD;  Location: [facility] ORS;  Service: General;;  Partial omentectomy   STENT REMOVAL     UMBILICAL HERNIA REPAIR N/A 12/22/2014   Procedure: HERNIA REPAIR UMBILICAL ADULT;  Surgeon: Christene Lye, MD;  Location: [facility] ORS;  Service: General;  Laterality: N/A;   HPI:  Bobby Peterson is a 80 y.o. Caucasian male with medical history significant for COPD on home O2 at 2 L/min, diastolic CHF, stage III chronic kidney disease, type II diabetes mellitus, coronary artery disease, colovesical fistula and recent COVID pneumonia and acute on chronic respiratory failure for which she was admitted here on 11/29 and discharged on 12/3.  He did fairly well after his discharge, per his wife but unfortunately became more confused over the last 24 hours and has developed visual hallucinations.  He had urinary and stool incontinence.  He did not have any reported chest pain or dyspnea or cough or wheezing.  No fever or chills.  No nausea or vomiting or abdominal pain were reported. CXR 07/06/21, revealed "Patchy bibasilar airspace opacities, with slight interval worsening  on the left and a probable small left pleural effusion. Findings are  nonspecific, although in this setting suspicious for mild viral  pneumonia."    Assessment / Plan / Recommendation  Clinical Impression  Pt presents with suspected oropharyngeal dysphagia in  the setting of Acute metabolic encephalopathy with increased respiratory needs. Pt with cough noted x2, once during mastication of regular solids and x1 with liquid wash following regular solid trial. Suspect that cough is related to reduced control of bolus during manipulation and challenge of mixed consistency with liquid wash. No other s/sx of aspiration noted. O2 saturations (87-99) fluctuated  t/o trials, though did not dip below 89 with PO trials. Oral phase c/b increased time for mastication 2/2 edentulous nature. Pt notably impulsive with intake, leading to increased portion size and consecutive sips.   Given s/sx of aspiration related to regular solids, impulsivity, and increased risk of aspiration with current respiratory status, recommend Dys 2 (chopped) with thin liquids, medications administered whole via puree, aspiration precautions (slow rate, small bites, elevated HOB, and alert for PO intake), and supervision to monitor impulsivity. RN in agreement with plan. SLP will continue to follow. SLP Visit Diagnosis: Dysphagia, oropharyngeal phase (R13.12)    Aspiration Risk  Moderate aspiration risk    Diet Recommendation     Medication Administration: Whole meds with puree    Other  Recommendations Oral Care Recommendations: Oral care BID    Recommendations for follow up therapy are one component of a multi-disciplinary discharge planning process, led by the attending physician.  Recommendations may be updated based on patient status, additional functional criteria and insurance authorization.  Follow up Recommendations  (TBD)      Assistance Recommended at Discharge Intermittent Supervision/Assistance  Functional Status Assessment Patient has had a recent decline in their functional status and/or demonstrates limited ability to make significant improvements in function in a reasonable and predictable amount of time  Frequency and Duration min 2x/week  2 weeks       Prognosis Prognosis for Safe Diet Advancement: Guarded      Swallow Study   General Date of Onset: 07/07/21 HPI: Bobby Peterson is a 80 y.o. Caucasian male with medical history significant for COPD on home O2 at 2 L/min, diastolic CHF, stage III chronic kidney disease, type II diabetes mellitus, coronary artery disease, colovesical fistula and recent COVID pneumonia and acute on chronic respiratory  failure for which she was admitted here on 11/29 and discharged on 12/3.  He did fairly well after his discharge, per his wife but unfortunately became more confused over the last 24 hours and has developed visual hallucinations.  He had urinary and stool incontinence.  He did not have any reported chest pain or dyspnea or cough or wheezing.  No fever or chills.  No nausea or vomiting or abdominal pain were reported. CXR 07/06/21, revealed "Patchy bibasilar airspace opacities, with slight interval worsening  on the left and a probable small left pleural effusion. Findings are  nonspecific, although in this setting suspicious for mild viral  pneumonia." Type of Study: Bedside Swallow Evaluation Previous Swallow Assessment: Previously seen in 2017, wiht recomendations for Dys 3 and thin liquids Diet Prior to this Study: NPO Temperature Spikes Noted: No (18.2 WBC trending up) Respiratory Status: Nasal cannula (high flow nasal canula) History of Recent Intubation: No Behavior/Cognition: Confused;Impulsive;Requires cueing Oral Cavity Assessment: Within Functional Limits;Dry Oral Care Completed by SLP: Other (Comment) (pt requested to hold) Oral Cavity - Dentition: Edentulous;Dentures, not available (pt report mastication w/o dentures for "years") Vision: Functional for self-feeding Self-Feeding Abilities: Able to feed self Patient Positioning: Upright in bed Baseline Vocal Quality: Hoarse;Low vocal intensity Volitional Cough: Congested Volitional Swallow: Unable to elicit    Oral/Motor/Sensory Function  Ice Chips Ice chips: Not tested   Thin Liquid Thin Liquid: Impaired Presentation: Cup;Straw Oral Phase Impairments:  (WFL) Oral Phase Functional Implications:  (WFL) Pharyngeal  Phase Impairments: Cough - Delayed (noted x1/6 trials)    Nectar Thick Nectar Thick Liquid: Not tested   Honey Thick Honey Thick Liquid: Not tested   Puree Puree: Within functional limits Presentation: Spoon    Solid     Solid: Impaired Presentation: Self Fed Oral Phase Impairments:  [facility]) Oral Phase Functional Implications: Oral residue;Prolonged oral transit Pharyngeal Phase Impairments: Cough - Immediate (with regular solids) Other Comments: no s/sx of aspiration with soft solids trial     [place] Teffany Blaszczyk MS, CCC-SLP  [place] C Quetzali Heinle 07/07/2021,1:20 PM

## 2021-07-07 NOTE — Evaluation (Signed)
Physical Therapy Evaluation Patient Details Name: Bobby Peterson MRN: [id number] DOB: [birth year] Today's Date: 07/07/2021  History of Present Illness  Pt admitted for sepsis secondary to UTI with complaints of confusion. recent hospital stay from 11/29-12/3 for similar symptoms. History includes COPD on home O2 (2L baseline) however since dc, family reports he was unable to maintain sats at 2L. Other history includes legally blind, CHF, CKD, DM, and CAD.  Clinical Impression  Pt is a pleasant 80 year old male who was admitted for sepsis with UTI. Pt performs bed mobility, transfers, and ambulation with cga and HHA. Ambulation limited by poor endurance, currently on HFNC, and quick desat with exertion. Pt demonstrates deficits with strength/mobility/endurance. Able to perform there-ex and follows commands well. Family very certain they want to take him home at time of discharge, especially if hospice is involved. Would benefit from skilled PT to address above deficits and promote optimal return to PLOF. Recommend transition to [place] upon discharge from acute hospitalization.      Recommendations for follow up therapy are one component of a multi-disciplinary discharge planning process, led by the attending physician.  Recommendations may be updated based on patient status, additional functional criteria and insurance authorization.  Follow Up Recommendations Home health PT    Assistance Recommended at Discharge Frequent or constant Supervision/Assistance  Functional Status Assessment Patient has had a recent decline in their functional status and demonstrates the ability to make significant improvements in function in a reasonable and predictable amount of time.  Equipment Recommendations  None recommended by PT    Recommendations for Other Services       Precautions / Restrictions Precautions Precautions: Fall Restrictions Weight Bearing Restrictions: No      Mobility  Bed  Mobility Overal bed mobility: Needs Assistance Bed Mobility: Supine to Sit     Supine to sit: Min guard     General bed mobility comments: safe technique with cues for sequencing. All mobility performed on 45L of HFNC. Ease of technique coming to sitting at EOB with upright posture    Transfers Overall transfer level: Needs assistance Equipment used: 1 person hand held assist Transfers: Sit to/from Stand Sit to Stand: Min guard           General transfer comment: safe technique with slight increased sway during static standing. HHA given.    Ambulation/Gait Ambulation/Gait assistance: Min guard Gait Distance (Feet): 3 Feet Assistive device: 1 person hand held assist Gait Pattern/deviations: Step-to pattern;Shuffle       General Gait Details: able to take a few steps over to [facility]. Further distance deferred due to HFNC and quick de-sat to 80% with exertion. Quick recovery to 90s once seated. Anticipate ability to tolerate further distances once O2 sats improved  Stairs            Wheelchair Mobility    Modified Rankin (Stroke Patients Only)       Balance Overall balance assessment: Needs assistance Sitting-balance support: No upper extremity supported;Feet supported Sitting balance-Leahy Scale: Good     Standing balance support: Single extremity supported Standing balance-Leahy Scale: Fair                               Pertinent Vitals/Pain Pain Assessment: No/denies pain    Home Living Family/patient expects to be discharged to:: Private residence Living Arrangements: Spouse/significant other Available Help at Discharge: Family;Available 24 hours/day Type of Home: House Home Access: Ramped entrance  Home Layout: One level Home Equipment: Conservation officer, nature (2 wheels);BSC/3in1;Shower seat;Cane - single point      Prior Function Prior Level of Function : Independent/Modified Independent             Mobility Comments: Per wife,  pt amb with no AD or assist in household despite being legally blind. ADLs Comments: Wife reports he is independent with bathing, dressing, feeding. Since last admission, pt has been increasingly confused and urinating/deficating in the home     Hand Dominance        Extremity/Trunk Assessment   Upper Extremity Assessment Upper Extremity Assessment: Overall WFL for tasks assessed    Lower Extremity Assessment Lower Extremity Assessment: Generalized weakness (B LE grossly 4/5)       Communication   Communication: HOH  Cognition Arousal/Alertness: Awake/alert Behavior During Therapy: WFL for tasks assessed/performed Overall Cognitive Status: Impaired/Different from baseline                                 General Comments: oriented to self, date. Thinks he is at family's home. Does recognize family members at bedside. Able to correctly identify fingers during vision testing        General Comments      Exercises Other Exercises Other Exercises: supine/seated/standing ther-ex performed on B LE including SLRs, LAQ, alt marching, standing mini squats, and standing alt marching. 10 reps with very slow reps in order to keep O2 sats WNL Other Exercises: ambulated to [facility] for watery BM. RN notified. Pt able to perform self hygiene with supervision and set up   Assessment/Plan    PT Assessment Patient needs continued PT services  PT Problem List Decreased cognition;Decreased mobility;Decreased safety awareness       PT Treatment Interventions DME instruction;Therapeutic exercise;Gait training;Balance training;Neuromuscular re-education;Functional mobility training;Therapeutic activities;Patient/family education    PT Goals (Current goals can be found in the Care Plan section)  Acute Rehab PT Goals Patient Stated Goal: return home PT Goal Formulation: With patient Time For Goal Achievement: 07/21/21 Potential to Achieve Goals: Good    Frequency Min 2X/week    Barriers to discharge        Co-evaluation               AM-PAC PT "6 Clicks" Mobility  Outcome Measure Help needed turning from your back to your side while in a flat bed without using bedrails?: A Little Help needed moving from lying on your back to sitting on the side of a flat bed without using bedrails?: A Little Help needed moving to and from a bed to a chair (including a wheelchair)?: A Little Help needed standing up from a chair using your arms (e.g., wheelchair or bedside chair)?: A Little Help needed to walk in hospital room?: A Little Help needed climbing 3-5 steps with a railing? : A Lot 6 Click Score: 17    End of Session Equipment Utilized During Treatment: Oxygen Activity Tolerance: Patient tolerated treatment well Patient left: in bed;with family/visitor present Nurse Communication: Mobility status PT Visit Diagnosis: Other abnormalities of gait and mobility (R26.89)    Time: 1017-5102 PT Time Calculation (min) (ACUTE ONLY): 31 min   Charges:   PT Evaluation $PT Eval Moderate Complexity: 1 Mod PT Treatments $Therapeutic Exercise: 8-22 mins        Greggory Stallion, PT, DPT [phone]   Seidy Labreck 07/07/2021, 1:11 PM

## 2021-07-07 NOTE — ED Notes (Signed)
Pt re-directed to lay back down in the bed after tele -sitter alerted charge that pt was attempting to get out of the bed. Pt stated he wanted to get up and put some clothes on to leave.

## 2021-07-07 NOTE — ED Notes (Signed)
Pt declined his dinner tray - Pt provided a meal supplement drink.

## 2021-07-07 NOTE — ED Notes (Signed)
RT called at this time to notify of pt satting in mid 80's on O2. Pt had unplugged a temp sens on high flow [place] setup and was not receiving full dose of O2, this RN reconnected sensor, pt still satting mid 80's, RT notified of this at this time.

## 2021-07-07 NOTE — ED Notes (Signed)
Pt is asleep in bed with heated high flow in nars. Pt is on the monitor will continue to monitor.

## 2021-07-07 NOTE — Progress Notes (Signed)
PROGRESS NOTE    Bobby Peterson  QQI:[id number] DOB: [birth year] DOA: 07/29/2021 PCP: Virginia Crews, MD    Brief Narrative:  Bobby Peterson is a 80 y.o. Caucasian male with medical history significant for COPD on home O2 at 2 L/min, diastolic CHF, stage III chronic kidney disease, type II diabetes mellitus, coronary artery disease, colovesical fistula and recent COVID pneumonia and acute on chronic respiratory failure for which she was admitted here on 11/29 and discharged on 12/3.  He did fairly well after his discharge, per his wife but unfortunately became more confused over the last 24 hours and has developed visual hallucinations.  He had urinary and stool incontinence.  He did not have any reported chest pain or dyspnea or cough or wheezing.  No fever or chills.  No nausea or vomiting or abdominal pain were reported.   ED Course: Upon presentation to the emergency room, vital signs were within normal with a pulse ox of 97% on 2 L of O2 by nasal cannula.  Labs revealed BUN of 44 and creatinine of 1.38.  And previous levels with potassium 5 and total protein 6.3 and albumin of 3.1.  CBC showed leukocytosis 16.8.  UA was positive for UTI.[name]  12/7 pt on HF . Appears tired.wife reports thinks he is little better. Pt unable to really tell me if getting better. Wife said "they gave him 3 months". Tried explaining to her that we need to see how he is going to do next 48 hrs.   Consultants:    Procedures:   Antimicrobials:  zosyn    Subjective: Sob, cough. No cp  Objective: Vitals:   07/07/21 0830 07/07/21 0843 07/07/21 0844 07/07/21 0924  BP: (!) 116/52   111/74  Pulse: (!) 114 (!) 110 99 (!) 118  Resp: 18 14 16    Temp:      TempSrc:      SpO2: 91% 93% 97%   Weight:      Height:        Intake/Output Summary (Last 24 hours) at 07/07/2021 0937 Last data filed at 07/07/2021 9417 Gross per 24 hour  Intake 135.2 ml  Output 1800 ml  Net -1664.8 ml   Filed Weights    07/25/2021 2303  Weight: 74.8 kg    Examination:  General exam: Appears calm and comfortable  Respiratory system: decrease bs, rhonchi Cardiovascular system: S1 & S2 heard, RRR. No JVD, murmurs, rubs, gallops or clicks.  Gastrointestinal system: Abdomen is nondistended, soft and nontender. Normal bowel sounds heard. Central nervous system: Awake and alert. Grossly appears intact Extremities:no edema Skin: Psychiatry: Mood & affect appropriate.     Data Reviewed: I have personally reviewed following labs and imaging studies  CBC: Recent Labs  Lab 07/02/2021 2315 07/06/21 0257 07/07/21 0639  WBC 16.5* 14.6* 18.2*  NEUTROABS 14.5*  --  14.5*  HGB 13.9 13.8 14.3  HCT 43.7 43.3 45.0  MCV 89.7 88.7 88.9  PLT 282 271 408   Basic Metabolic Panel: Recent Labs  Lab 07/14/2021 2315 07/06/21 0257 07/07/21 0639  NA 137 141 139  K 5.0 4.7 4.3  CL 101 101 96*  CO2 28 32 32  GLUCOSE 187* 159* 89  BUN 44* 44* 38*  CREATININE 1.38* 1.43* 1.62*  CALCIUM 9.4 9.4 9.0  MG  --  2.4 2.3   GFR: Estimated Creatinine Clearance: 33.2 mL/min (A) (by C-G formula based on SCr of 1.62 mg/dL (H)). Liver Function Tests: Recent Labs  Lab 07/28/2021  2315  AST 15  ALT 14  ALKPHOS 64  BILITOT 1.0  PROT 6.3*  ALBUMIN 3.1*   No results for input(s): LIPASE, AMYLASE in the last 168 hours. No results for input(s): AMMONIA in the last 168 hours. Coagulation Profile: Recent Labs  Lab 07/06/21 0257  INR 1.1   Cardiac Enzymes: No results for input(s): CKTOTAL, CKMB, CKMBINDEX, TROPONINI in the last 168 hours. BNP (last 3 results) No results for input(s): PROBNP in the last 8760 hours. HbA1C: No results for input(s): HGBA1C in the last 72 hours. CBG: Recent Labs  Lab 07/02/21 1139 07/02/21 1638 07/02/21 2022 07/03/21 0729 07/03/21 1146  GLUCAP 105* 135* 161* 111* 119*   Lipid Profile: No results for input(s): CHOL, HDL, LDLCALC, TRIG, CHOLHDL, LDLDIRECT in the last 72 hours. Thyroid  Function Tests: Recent Labs    07/06/21 0257  TSH 3.305   Anemia Panel: No results for input(s): VITAMINB12, FOLATE, FERRITIN, TIBC, IRON, RETICCTPCT in the last 72 hours. Sepsis Labs: Recent Labs  Lab 07/06/21 0257  PROCALCITON <0.10    Recent Results (from the past 240 hour(s))  Resp Panel by RT-PCR (Flu A&B, Covid) Nasopharyngeal Swab     Status: Abnormal   Collection Time: 06/28/21 10:19 AM   Specimen: Nasopharyngeal Swab; Nasopharyngeal(NP) swabs in vial transport medium  Result Value Ref Range Status   SARS Coronavirus 2 by RT PCR POSITIVE (A) NEGATIVE Final    Comment: RESULT CALLED TO, READ BACK BY AND VERIFIED WITH: C/SAMANTHA HAMILTON 06/28/21 1157 AMK (NOTE) SARS-CoV-2 target nucleic acids are DETECTED.  The SARS-CoV-2 RNA is generally detectable in upper respiratory specimens during the acute phase of infection. Positive results are indicative of the presence of the identified virus, but do not rule out bacterial infection or co-infection with other pathogens not detected by the test. Clinical correlation with patient history and other diagnostic information is necessary to determine patient infection status. The expected result is Negative.  Fact Sheet for Patients: EntrepreneurPulse.com.au  Fact Sheet for Healthcare Providers: IncredibleEmployment.be  This test is not yet approved or cleared by the [place] FDA and  has been authorized for detection and/or diagnosis of SARS-CoV-2 by FDA under an Emergency Use Authorization (EUA).  This EUA will remain in effect (meaning this test ca n be used) for the duration of  the COVID-19 declaration under Section 564(b)(1) of the Act, 21 U.S.C. section 360bbb-3(b)(1), unless the authorization is terminated or revoked sooner.     Influenza A by PCR NEGATIVE NEGATIVE Final   Influenza B by PCR NEGATIVE NEGATIVE Final    Comment: (NOTE) The Xpert Xpress SARS-CoV-2/FLU/RSV  plus assay is intended as an aid in the diagnosis of influenza from Nasopharyngeal swab specimens and should not be used as a sole basis for treatment. Nasal washings and aspirates are unacceptable for Xpert Xpress SARS-CoV-2/FLU/RSV testing.  Fact Sheet for Patients: EntrepreneurPulse.com.au  Fact Sheet for Healthcare Providers: IncredibleEmployment.be  This test is not yet approved or cleared by the [place] FDA and has been authorized for detection and/or diagnosis of SARS-CoV-2 by FDA under an Emergency Use Authorization (EUA). This EUA will remain in effect (meaning this test can be used) for the duration of the COVID-19 declaration under Section 564(b)(1) of the Act, 21 U.S.C. section 360bbb-3(b)(1), unless the authorization is terminated or revoked.  Performed at [facility], [address] [place], [place] [address]   Culture, blood (routine x 2)     Status: None   Collection Time: 06/28/21  7:05 PM   Specimen: BLOOD  Result Value Ref Range Status   Specimen Description BLOOD BLOOD LEFT HAND  Final   Special Requests   Final    BOTTLES DRAWN AEROBIC AND ANAEROBIC Blood Culture results may not be optimal due to an inadequate volume of blood received in culture bottles   Culture   Final    NO GROWTH 5 DAYS Performed at [facility], [address] [place], [place] [address]    Report Status 07/03/2021 FINAL  Final  Culture, blood (routine x 2)     Status: None   Collection Time: 06/28/21  7:05 PM   Specimen: BLOOD  Result Value Ref Range Status   Specimen Description BLOOD RIGHT ANTECUBITAL  Final   Special Requests   Final    BOTTLES DRAWN AEROBIC AND ANAEROBIC Blood Culture results may not be optimal due to an inadequate volume of blood received in culture bottles   Culture   Final    NO GROWTH 5 DAYS Performed at [facility], [address] [place], [place] [address]    Report Status  07/03/2021 FINAL  Final  Urine Culture     Status: Abnormal   Collection Time: 06/28/21  7:05 PM   Specimen: Urine, Clean Catch  Result Value Ref Range Status   Specimen Description   Final    URINE, CLEAN CATCH Performed at [facility], [address] [place], [place] [address]    Special Requests   Final    NONE Performed at [facility], [address] [place], [place] [address]    Culture MULTIPLE SPECIES PRESENT, SUGGEST RECOLLECTION (A)  Final   Report Status 06/29/2021 FINAL  Final  CULTURE, BLOOD (ROUTINE X 2) w Reflex to ID Panel     Status: None (Preliminary result)   Collection Time: 07/06/21  2:57 AM   Specimen: BLOOD  Result Value Ref Range Status   Specimen Description BLOOD RIGHT HAND  Final   Special Requests   Final    BOTTLES DRAWN AEROBIC ONLY Blood Culture adequate volume   Culture   Final    NO GROWTH < 12 HOURS Performed at [facility], [address] [place], [place] [address]    Report Status PENDING  Incomplete  CULTURE, BLOOD (ROUTINE X 2) w Reflex to ID Panel     Status: None (Preliminary result)   Collection Time: 07/06/21  2:58 AM   Specimen: Blood  Result Value Ref Range Status   Specimen Description BLOOD RIGTH [facility]  Final   Special Requests   Final    BOTTLES DRAWN AEROBIC AND ANAEROBIC Blood Culture adequate volume   Culture   Final    NO GROWTH < 12 HOURS Performed at [facility], [address], [place], [place] [address]    Report Status PENDING  Incomplete         Radiology Studies: DG Chest Port 1 View  Result Date: 07/06/2021 CLINICAL DATA:  Acute hypoxemic respiratory failure. Diagnosed with COVID-19 infection 8 days prior. EXAM: PORTABLE CHEST 1 VIEW COMPARISON:  Radiographs 07/23/2021, 07/03/2021 and 06/28/2021. CT abdomen 06/28/2021. FINDINGS: 1426 hours. The heart size and mediastinal contours are stable with aortic atherosclerosis. Probable new small left pleural effusion with  increased patchy airspace disease at the left lung base. Patchy right basilar opacity is unchanged from the recent prior studies, although increased from older prior studies. No consolidation, edema or pneumothorax. The bones appear unremarkable. Telemetry leads overlie the  chest. IMPRESSION: Patchy bibasilar airspace opacities, with slight interval worsening on the left and a probable small left pleural effusion. Findings are nonspecific, although in this setting suspicious for mild viral pneumonia. Electronically Signed   By: Richardean Sale M.D.   On: 07/06/2021 14:44   DG Chest Portable 1 View  Result Date: 07/31/2021 CLINICAL DATA:  Cough and confusion, initial encounter EXAM: PORTABLE CHEST 1 VIEW COMPARISON:  07/03/2021 FINDINGS: Cardiac shadow is stable. Aortic calcifications are noted. Lungs are well aerated bilaterally with mild bibasilar atelectasis. No sizable effusion is seen. No bony abnormality is noted. IMPRESSION: Mild bibasilar atelectasis. Electronically Signed   By: Inez Catalina M.D.   On: 07/11/2021 23:35        Scheduled Meds:  aspirin EC  81 mg Oral Daily   enoxaparin (LOVENOX) injection  40 mg Subcutaneous Q24H   furosemide  40 mg Oral Daily   gabapentin  300 mg Oral BID   metoprolol succinate  150 mg Oral Daily   pantoprazole  40 mg Oral Daily   pravastatin  20 mg Oral Daily   tamsulosin  0.4 mg Oral Daily   [START ON 08-07-2021] vitamin B-12  1,000 mcg Oral Weekly   Continuous Infusions:  lactated ringers 50 mL/hr at 07/06/21 1738   piperacillin-tazobactam 3.375 g (07/07/21 0931)    Assessment & Plan:   Principal Problem:   Sepsis due to gram-negative UTI (HCC) Active Problems:   COPD (chronic obstructive pulmonary disease) (HCC)   Aspiration pneumonia of both lower lobes due to gastric secretions (HCC)   Chronic hypoxemic respiratory failure (HCC)   Sepsis (HCC)   Colovesical fistula   Acute on chronic respiratory failure with hypoxemia ([place])   1.   Acute metabolic encephalopathy with delirium secondary to UTI. Patient with colovesical fistula Will continue IV Zosyn Follow-up cultures   2.  Sepsis due to UTI. Continue IV Zosyn  3. Acute on chronic hypoxemic respiratory failure With recent hx/o covid Being tx for aspiration pna Wbc worse. He coughed up food debris after eating. On HF now. Will continue asp. Precuation Will consult PCCm Px poor overall. Speech saw pt see note.  4.  Type 2 diabetes mellitus with peripheral neuropathy and stage III b chronic kidney disease. On neurontin Bg stable Hold metformin   5.  GERD. - We will continue PPI therapy.  6.  Dyslipidemia. - We will continue statin therapy.  7.  BPH. - We will continue Flomax.  8.  COPD without exacerbation with chronic respiratory failure. - We will continue his home O2.  8.  Recent COVID-19 pneumonia. - The patient has been adequately managed and it has been 9 days since he he came here to the hospital and was positive several days before.  He has no current respiratory symptoms.  I do not believe he needs isolation at this time.  9.  Abnormal EKG with questionable new onset A. fib. - Repeat EKG.  His rate is fairly controlled. - We will add a TSH and magnesium level.     DVT prophylaxis: Lovenox  Code Status: Enoxaparin Family Communication: At bedside Disposition Plan:  Status is: Inpatient  Remains inpatient appropriate because: IV treatment, patient requiring high flow oxygen.  Will consult palliative and pccm          LOS: 1 day   Time spent: 35 minutes with more than 50% on [place], MD [facility] Pager [phone]  If 7PM-7AM, please contact night-coverage  07/07/2021, 9:37 AM

## 2021-07-08 DIAGNOSIS — N321 Vesicointestinal fistula: Secondary | ICD-10-CM

## 2021-07-08 DIAGNOSIS — J449 Chronic obstructive pulmonary disease, unspecified: Secondary | ICD-10-CM

## 2021-07-08 DIAGNOSIS — Z66 Do not resuscitate: Secondary | ICD-10-CM

## 2021-07-08 DIAGNOSIS — J9621 Acute and chronic respiratory failure with hypoxia: Secondary | ICD-10-CM | POA: Diagnosis not present

## 2021-07-08 DIAGNOSIS — R0902 Hypoxemia: Secondary | ICD-10-CM

## 2021-07-08 DIAGNOSIS — N390 Urinary tract infection, site not specified: Secondary | ICD-10-CM | POA: Diagnosis not present

## 2021-07-08 DIAGNOSIS — N3000 Acute cystitis without hematuria: Secondary | ICD-10-CM

## 2021-07-08 DIAGNOSIS — J9601 Acute respiratory failure with hypoxia: Secondary | ICD-10-CM

## 2021-07-08 DIAGNOSIS — J690 Pneumonitis due to inhalation of food and vomit: Secondary | ICD-10-CM

## 2021-07-08 DIAGNOSIS — R410 Disorientation, unspecified: Secondary | ICD-10-CM

## 2021-07-08 DIAGNOSIS — A4150 Gram-negative sepsis, unspecified: Secondary | ICD-10-CM | POA: Diagnosis not present

## 2021-07-08 MED ORDER — LORAZEPAM 2 MG/ML IJ SOLN
1.0000 mg | INTRAMUSCULAR | Status: DC | PRN
  Administered 2021-07-09 – 2021-07-10 (×3): 1 mg via INTRAVENOUS
  Filled 2021-07-08 (×4): qty 1

## 2021-07-08 MED ORDER — HALOPERIDOL 0.5 MG PO TABS
0.5000 mg | ORAL_TABLET | ORAL | Status: DC | PRN
  Filled 2021-07-08: qty 1

## 2021-07-08 MED ORDER — MORPHINE SULFATE (PF) 2 MG/ML IV SOLN
1.0000 mg | INTRAVENOUS | Status: DC | PRN
  Filled 2021-07-08: qty 1

## 2021-07-08 MED ORDER — RISAQUAD PO CAPS
2.0000 | ORAL_CAPSULE | Freq: Three times a day (TID) | ORAL | Status: DC
  Administered 2021-07-08 (×2): 2 via ORAL
  Filled 2021-07-08 (×2): qty 2

## 2021-07-08 MED ORDER — MIDODRINE HCL 5 MG PO TABS: 10.0000 mg | ORAL_TABLET | Freq: Three times a day (TID) | ORAL | Status: DC

## 2021-07-08 MED ORDER — MORPHINE SULFATE (PF) 2 MG/ML IV SOLN
1.0000 mg | INTRAVENOUS | Status: DC | PRN
  Administered 2021-07-09 (×2): 1 mg via INTRAVENOUS
  Filled 2021-07-08 (×2): qty 1

## 2021-07-08 MED ORDER — GLYCOPYRROLATE 1 MG PO TABS
1.0000 mg | ORAL_TABLET | ORAL | Status: DC | PRN
  Filled 2021-07-08: qty 1

## 2021-07-08 MED ORDER — HALOPERIDOL LACTATE 2 MG/ML PO CONC
0.5000 mg | ORAL | Status: DC | PRN
  Filled 2021-07-08: qty 0.3

## 2021-07-08 MED ORDER — HALOPERIDOL LACTATE 5 MG/ML IJ SOLN
0.5000 mg | INTRAMUSCULAR | Status: DC | PRN
  Administered 2021-07-08: 0.5 mg via INTRAVENOUS
  Filled 2021-07-08: qty 1

## 2021-07-08 MED ORDER — LOPERAMIDE HCL 2 MG PO CAPS
2.0000 mg | ORAL_CAPSULE | Freq: Once | ORAL | Status: AC
Start: 2021-07-08 — End: 2021-07-08
  Administered 2021-07-08: 2 mg via ORAL
  Filled 2021-07-08: qty 1

## 2021-07-08 MED ORDER — LORAZEPAM 2 MG/ML IJ SOLN
1.0000 mg | Freq: Four times a day (QID) | INTRAMUSCULAR | Status: DC | PRN
  Filled 2021-07-08: qty 1

## 2021-07-08 MED ORDER — POLYVINYL ALCOHOL 1.4 % OP SOLN
1.0000 [drp] | Freq: Four times a day (QID) | OPHTHALMIC | Status: DC | PRN
  Filled 2021-07-08: qty 15

## 2021-07-08 MED ORDER — GLYCOPYRROLATE 0.2 MG/ML IJ SOLN
0.2000 mg | INTRAMUSCULAR | Status: DC | PRN
  Administered 2021-07-12: 0.2 mg via INTRAVENOUS
  Filled 2021-07-08 (×3): qty 1

## 2021-07-08 MED ORDER — GLYCOPYRROLATE 0.2 MG/ML IJ SOLN
0.2000 mg | INTRAMUSCULAR | Status: DC | PRN
  Filled 2021-07-08: qty 1

## 2021-07-08 NOTE — Plan of Care (Signed)
New admit patient on Optflow  of  Fio2 87% and  O2 45 l, this morning SPO2 decreased to 86-89 %, Respiratory notified and instructed to increase F io2 to 100% and would send someone to come to see the patient as soon as possible. I increased Fio2 to 99.4% and SPO2 did not change.  Problem: Health Behavior/Discharge Planning: Goal: Ability to manage health-related needs will improve Outcome: Progressing   Problem: Clinical Measurements: Goal: Ability to maintain clinical measurements within normal limits will improve Outcome: Progressing Goal: Will remain free from infection Outcome: Progressing Goal: Diagnostic test results will improve Outcome: Progressing Goal: Respiratory complications will improve Outcome: Progressing Goal: Cardiovascular complication will be avoided Outcome: Progressing

## 2021-07-08 NOTE — Progress Notes (Signed)
SLP Cancellation Note  Patient Details Name: Bobby Peterson MRN: [id number] DOB: 1940-08-07   Cancelled treatment:       Reason Eval/Treat Not Completed: Medical issues which prohibited therapy (Pt with difficulty maintain SpO2 and is requiring increased supplemental O2 requirements including HFNC (60L/min) and NRB. [place] discussions ongoing. Will defer SLP efforts at this time. RN made aware.)   Cherrie Gauze, M.S., [facility] [phone] [place])  Quintella Baton 07/08/2021, 12:28 PM

## 2021-07-08 NOTE — Progress Notes (Signed)
[facility]) Hospital Liaison Note  Received request from Transitions of Care Manager [place], LCSW, for hospice services at home after discharge.   Visited patient at bedside and spoke with wife [name] to initiate education related to hospice philosophy, services and team approach to care. Discussed current oxygen requirements and that current oxygen requirements cannot be provided during transport with EMS and that current oxygen requirements cannot be provided at home. Patient is confused and states that he is not dying and refuses to speak of dying. He states he is going to go home and live for 5 more years. Patient's wife also seems confused and does not seem to understand that current oxygen requirements cannot be met if patient leaves the hospital. Spoke with patient's daughter Lynelle Smoke and stepdaughter Terrence Dupont and relayed above information. Discussed the high possibility of patient passing away during transport home. Wife, Lynelle Smoke and Terrence Dupont voice understanding. Tammy states the decision is up to wife [name]. Per [name], she wants the patient to go home with hospice. Terrence Dupont states she will be at the home assisting [place] with care of patient. Above concerns addressed with Dr. Kurtis Bushman.  Awaiting decision as to whether patient will discharge hospital.   If patient discharges hospital, prior to patient leaving hospital, equipment will need to be delivered and coordination will need to be made with hospice admission's nurse so that nurse is at patient's home when he arrives.   Please do not hesitate to call with any hospice related questions or concerns.   Thank you for the opportunity to participate in this patient's care.   Bobbie "Loren Racer, RN, BSN [facility] Liaison [phone]

## 2021-07-08 NOTE — Significant Event (Signed)
Rapid Response Event Note   Reason for Call :  Respiratory distress  Initial Focused Assessment:   Patient on 100% HFNC and non-rebreather Alert and oriented. Oxygen sats in the low to mid 80's.   Interventions:   Plan of Care:  Dr. Kurtis Bushman spoke to patient's wife and bedside RN- plan that patient not to be on bipap unless he is struggling- which he is not at this time- plan for patient to go home with hospice.   Event Summary:   MD Notified:  Call Time: Arrival Time: End Time:  Penne Lash, RN

## 2021-07-08 NOTE — Progress Notes (Addendum)
PROGRESS NOTE    Bobby Peterson  KDX:[id number] DOB: May 24, 1941 DOA: 07/15/2021 PCP: Virginia Crews, MD    Brief Narrative:  Bobby Peterson is a 80 y.o. Caucasian male with medical history significant for COPD on home O2 at 2 L/min, diastolic CHF, stage III chronic kidney disease, type II diabetes mellitus, coronary artery disease, colovesical fistula and recent COVID pneumonia and acute on chronic respiratory failure for which she was admitted here on 11/29 and discharged on 12/3.  He did fairly well after his discharge, per his wife but unfortunately became more confused over the last 24 hours and has developed visual hallucinations.  He had urinary and stool incontinence.  He did not have any reported chest pain or dyspnea or cough or wheezing.  No fever or chills.  No nausea or vomiting or abdominal pain were reported.   ED Course: Upon presentation to the emergency room, vital signs were within normal with a pulse ox of 97% on 2 L of O2 by nasal cannula.  Labs revealed BUN of 44 and creatinine of 1.38.  And previous levels with potassium 5 and total protein 6.3 and albumin of 3.1.  CBC showed leukocytosis 16.8.  UA was positive for UTI.[name]  12/7 pt on HF . Appears tired.wife reports thinks he is little better. Pt unable to really tell me if getting better. Wife said "they gave him 3 months". Tried explaining to her that we need to see how he is going to do next 48 hrs.   12/8 I called RR as pt was requiring more 02. Spoke to PCCm this am, pt with poor px with current hyoxemia/pna, severe comorbid condtions and advanced age. Hospice was consulted.  Had a long discussion with wife and pts daughter , they are made aware of pts px.  PCCM and I agree to transition pt to comfort measures/hospice. Wife does not want hospice house, wants to take pt home with hospice.  Consultants:  PCCM  Procedures:   Antimicrobials:  zosyn    Subjective: On high flow FM, sats in 80's. States doing  "ok" when asked how he feels if sob.  Objective: Vitals:   07/08/21 0900 07/08/21 1108 07/08/21 1248 07/08/21 1416  BP: 115/65 101/63 111/68   Pulse: (!) 118  (!) 108   Resp: (!) 22 (!) 21 (!) 21   Temp: 98.7 F (37.1 C) 98.6 F (37 C) 97.8 F (36.6 C)   TempSrc: Axillary Axillary Axillary   SpO2: (!) 85% (!) 87% (!) 88% (!) 89%  Weight:      Height:        Intake/Output Summary (Last 24 hours) at 07/08/2021 1507 Last data filed at 07/08/2021 0308 Gross per 24 hour  Intake 84.32 ml  Output --  Net 84.32 ml   Filed Weights   07/25/2021 2303  Weight: 74.8 kg    Examination: On FM, not in respiratory distress Wheezing, rhonchi Reg s1/s2 no gallop Soft benign +bs No edema Confused, but answers questions   Data Reviewed: I have personally reviewed following labs and imaging studies  CBC: Recent Labs  Lab 07/16/2021 2315 07/06/21 0257 07/07/21 0639  WBC 16.5* 14.6* 18.2*  NEUTROABS 14.5*  --  14.5*  HGB 13.9 13.8 14.3  HCT 43.7 43.3 45.0  MCV 89.7 88.7 88.9  PLT 282 271 976   Basic Metabolic Panel: Recent Labs  Lab 07/10/2021 2315 07/06/21 0257 07/07/21 0639  NA 137 141 139  K 5.0 4.7 4.3  CL  101 101 96*  CO2 28 32 32  GLUCOSE 187* 159* 89  BUN 44* 44* 38*  CREATININE 1.38* 1.43* 1.62*  CALCIUM 9.4 9.4 9.0  MG  --  2.4 2.3   GFR: Estimated Creatinine Clearance: 33.2 mL/min (A) (by C-G formula based on SCr of 1.62 mg/dL (H)). Liver Function Tests: Recent Labs  Lab 07/06/2021 2315  AST 15  ALT 14  ALKPHOS 64  BILITOT 1.0  PROT 6.3*  ALBUMIN 3.1*   No results for input(s): LIPASE, AMYLASE in the last 168 hours. No results for input(s): AMMONIA in the last 168 hours. Coagulation Profile: Recent Labs  Lab 07/06/21 0257  INR 1.1   Cardiac Enzymes: No results for input(s): CKTOTAL, CKMB, CKMBINDEX, TROPONINI in the last 168 hours. BNP (last 3 results) No results for input(s): PROBNP in the last 8760 hours. HbA1C: No results for input(s): HGBA1C  in the last 72 hours. CBG: Recent Labs  Lab 07/02/21 1139 07/02/21 1638 07/02/21 2022 07/03/21 0729 07/03/21 1146  GLUCAP 105* 135* 161* 111* 119*   Lipid Profile: No results for input(s): CHOL, HDL, LDLCALC, TRIG, CHOLHDL, LDLDIRECT in the last 72 hours. Thyroid Function Tests: Recent Labs    07/06/21 0257  TSH 3.305   Anemia Panel: No results for input(s): VITAMINB12, FOLATE, FERRITIN, TIBC, IRON, RETICCTPCT in the last 72 hours. Sepsis Labs: Recent Labs  Lab 07/06/21 0257  PROCALCITON <0.10    Recent Results (from the past 240 hour(s))  Culture, blood (routine x 2)     Status: None   Collection Time: 06/28/21  7:05 PM   Specimen: BLOOD  Result Value Ref Range Status   Specimen Description BLOOD BLOOD LEFT HAND  Final   Special Requests   Final    BOTTLES DRAWN AEROBIC AND ANAEROBIC Blood Culture results may not be optimal due to an inadequate volume of blood received in culture bottles   Culture   Final    NO GROWTH 5 DAYS Performed at [facility], [address], [place], [place] [address]    Report Status 07/03/2021 FINAL  Final  Culture, blood (routine x 2)     Status: None   Collection Time: 06/28/21  7:05 PM   Specimen: BLOOD  Result Value Ref Range Status   Specimen Description BLOOD RIGHT ANTECUBITAL  Final   Special Requests   Final    BOTTLES DRAWN AEROBIC AND ANAEROBIC Blood Culture results may not be optimal due to an inadequate volume of blood received in culture bottles   Culture   Final    NO GROWTH 5 DAYS Performed at [facility], [address], [place], [place] [address]    Report Status 07/03/2021 FINAL  Final  Urine Culture     Status: Abnormal   Collection Time: 06/28/21  7:05 PM   Specimen: Urine, Clean Catch  Result Value Ref Range Status   Specimen Description   Final    URINE, CLEAN CATCH Performed at [facility] [address] [place], [place] [address]    Special Requests   Final     NONE Performed at [facility], [address] [place], [place] [address]    Culture MULTIPLE SPECIES PRESENT, SUGGEST RECOLLECTION (A)  Final   Report Status 06/29/2021 FINAL  Final  CULTURE, BLOOD (ROUTINE X 2) w Reflex to ID Panel     Status: None (Preliminary result)   Collection Time: 07/06/21  2:57 AM   Specimen: BLOOD  Result Value Ref Range  Status   Specimen Description BLOOD RIGHT HAND  Final   Special Requests   Final    BOTTLES DRAWN AEROBIC ONLY Blood Culture adequate volume   Culture   Final    NO GROWTH 2 DAYS Performed at [facility], [address] [place], [place] [address]    Report Status PENDING  Incomplete  CULTURE, BLOOD (ROUTINE X 2) w Reflex to ID Panel     Status: None (Preliminary result)   Collection Time: 07/06/21  2:58 AM   Specimen: BLOOD  Result Value Ref Range Status   Specimen Description BLOOD RIGTH [facility]  Final   Special Requests   Final    BOTTLES DRAWN AEROBIC AND ANAEROBIC Blood Culture adequate volume   Culture   Final    NO GROWTH 2 DAYS Performed at [facility], [address] [place], [place] [address]    Report Status PENDING  Incomplete  MRSA Next Gen by PCR, Nasal     Status: None   Collection Time: 07/07/21 11:52 AM   Specimen: Nasal Mucosa; Nasal Swab  Result Value Ref Range Status   MRSA by PCR Next Gen NOT DETECTED NOT DETECTED Final    Comment: (NOTE) The GeneXpert MRSA Assay (FDA approved for NASAL specimens only), is one component of a comprehensive MRSA colonization surveillance program. It is not intended to diagnose MRSA infection nor to guide or monitor treatment for MRSA infections. Test performance is not FDA approved in patients less than 36 years old. Performed at [facility], [address], [place], [place] [address]          Radiology Studies: No results found.      Scheduled Meds:  aspirin EC  81 mg Oral Daily   enoxaparin (LOVENOX) injection  40 mg  Subcutaneous Q24H   gabapentin  300 mg Oral BID   hydrocortisone sod succinate (SOLU-CORTEF) inj  100 mg Intravenous Q12H   midodrine  5 mg Oral TID WC   pantoprazole  40 mg Oral Daily   pravastatin  20 mg Oral Daily   tamsulosin  0.4 mg Oral Daily   [START ON 07-25-2021] vitamin B-12  1,000 mcg Oral Weekly   Continuous Infusions:  piperacillin-tazobactam 3.375 g (07/08/21 0951)    Assessment & Plan:   Principal Problem:   Sepsis due to gram-negative UTI (HCC) Active Problems:   COPD (chronic obstructive pulmonary disease) (HCC)   Aspiration pneumonia of both lower lobes due to gastric secretions (HCC)   Chronic hypoxemic respiratory failure (HCC)   Sepsis (HCC)   Colovesical fistula   Acute on chronic respiratory failure with hypoxemia (HCC)   Hypoxia   1.  Acute metabolic encephalopathy with delirium secondary to UTI, more from hypoxemia and PNA    Patient with colovesical fistula 12/8 continue iv zosyn O2 support    2.  Sepsis due to UTI/PNA Continue iv zoysn Solucortif for adrenal insuff.  Midodrine added, will increase dose Hemodynamics still not good  3. Acute on chronic hypoxemic respiratory failure With recent hx/o covid Being tx for aspiration pna Wbc worse. He coughed up food debris after eating. 12/8 with diarrhea, and hypotension, lasix was discontinued. Holding ivf to prevent vol. Overload as PCCM also agrees. Currently on HF /FM.  Will need to transition to comfort measures and hospice.  Pccm following, input appreciated  4.  Type 2 diabetes mellitus with peripheral neuropathy and stage III b chronic kidney disease. Bg stable. Continue neurontin Holding metformin   5.  GERD. ppi  6.  Dyslipidemia. Continue statin  7.  BPH. - We will continue Flomax.  8.  COPD without exacerbation with chronic respiratory failure. - We will continue his home O2.  8.  Recent COVID-19 pneumonia. - The patient has been adequately managed and it has been 9  days since he he came here to the hospital and was positive several days before.  He has no current respiratory symptoms.  I do not believe he needs isolation at this time.  9.  Abnormal EKG  Not afib. Lots of movement artifact , see sr, with pacs. Will monitor.       DVT prophylaxis: Lovenox  Code Status: ZWC58  Family Communication: wife at bedside Disposition Plan:  Status is: Inpatient  Remains inpatient appropriate because: IV treatment, patient requiring high flow oxygen.poor px   Had long discuss with pts daughter  and pts wife about poor px. Pt at high risk of death as his O2 sat are <88% and on HF . Currently on FM. They verbalize an understanding. They agreed to discuss with hospice about going home with hospice. They understand going to hospice pt can die en route or shortly getting home as he is requiring HF . Transportation by ambulance may not take him since requiring so much 02. Wife wishes to take pt home with hospice. Palliative and hospice consulted, please see note.          LOS: 2 days   Time spent: 35 minutes with more than 50% on [place], MD [facility] Pager [phone]  If 7PM-7AM, please contact night-coverage 07/08/2021, 3:07 PM

## 2021-07-08 NOTE — Progress Notes (Addendum)
Consultation Note Date: 07/08/2021   Patient Name: Bobby Peterson  DOB: April 09, 1941  MRN: [id number]  Age / Sex: 80 y.o., male  PCP: Virginia Crews, MD Referring Physician: Nolberto Hanlon, MD  Reason for Consultation: Establishing goals of care  HPI/Patient Profile: 80 y.o. male  with past medical history of COPD (home O2 baseline at 2L/M IN, diastolic CHF, CKD, diabetes type 2, CAD, colovesicular fistula, and recent COVID-pneumonia with acute on chronic respiratory failure admitted on 07/08/2021 with confusion and visual hallucinations.  Patient has urinary and stool incontinence as well.  Palliative medicine team was consulted to discuss goals of care. I responded to request from Dr. Kurtis Bushman this AM to see if I am able to assist with having patient discharged home with hospice services today.  Clinical Assessment and Goals of Care: I have reviewed medical records including EPIC notes, labs and imaging.I spoke with the patient's daughter Lynelle Smoke who was unable to come to the hospital due to contracting COVID-19.  Tammy shares that she understands her father is at end-of-life.  She is concerned that her stepmother/Linda has some baseline confusion.  Tammy shared that [name] said someone spoke with her this morning about her being able to take the patient home with hospice services.  Tammy is asking what and how home with hospice would work.  I shared my great concern that the patient's oxygen demands are too high for him to be able to return home at this point.  However, I will need to assess the patient before making this final recommendation.    I assessed the patient and then met with patient and his wife [name] at bedside to discuss diagnosis prognosis, [place], EOL wishes, disposition and options.   I introduced Palliative Medicine as specialized medical care for people living with serious illness. It focuses on  providing relief from the symptoms and stress of a serious illness. The goal is to improve quality of life for both the patient and the family.  We discussed a brief life review of the patient. He worked for [employer] for over 20 years. He and his wife have been married for over 14. He has one daughter from a previous relationship and his wife has 3 children from a previous relationship. Patient expressed deep admiration and appreciation for his wife and their 4 children.  We discussed patient's current illness and what it means in the larger context of patient's on-going co-morbidities.  Natural disease trajectory and expectations at EOL were discussed. I shared that he is at high risk of dying. He says he is not dying and feels just fine.  He says he thinks he has 5 more years to live.  His wife shared that we could all die at any time.    I attempted to elicit values and goals of care important to the patient. Both patient and wife shared multiple times that they want to go home. I conveyed that his high oxygen demands as they are right now would not allow him to  be safely transported back home. Patient and wife shared they have oxygen at home and they can just use that when they get there.  I attempted to educate and clarify that his oxygen demand is way more than what is able to be provided both for transport and at home.  Education offered regarding concept specific to human mortality and the limitations of medical interventions to prolong life.  I shared that we will attempt to turn his oxygen down to get him to a level that would be safe for him to be able to transfer home.  However, this would involve giving him medications should he become short of breath or anxious and that these medications may make him sleepy. Neither the patient nor the wife are in agreement to move forward with this as they said they would just like take him home and use the oxygen that they have.  I again  reiterated that this did not appear to be a safe plan as the oxygen at home is nowhere near the level of oxygen he is requiring here at the hospital and it is very likely he could pass away in transport.  Again, both patient and wife said they just want to get home.   Hospice services outpatient were explained and offered. Patient asked why he needed Hospice when he has oxygen tanks and medical supplies at his house. I again shared that he has reached end of life. I reiterated that despite optimal medical treatments that we are not able to improve his overall poor prognosis. Pt again said he felt fine, he wasn't dying, and he just wanted to go home.  I shared with patient, patient's wife, and patient's nurse that I am prescribing as needed morphine and Ativan.  Both medications should be used should the patient become short of breath, anxious, or have air hunger.  Dr. Kurtis Bushman, SW, nurse [place], Hospice Liaison Lorenza [place], and PMT Medical Director Dr. Rhea Pink made aware of my concerns that patient cannot transport home while needing HFNC at 60L and nonrebreather at 100%. Hospice Liaison met with family and is in agreement that patient is not stable to be transferred home at this time. As per pulmonology, patient has a poor prognosis.   My assessment is that the patient would not be able to survive transport home and should remain in the hospital to ensure compassionate, dignified end of life care with optimal symptom management.   As per Dr. Kurtis Bushman, pt's wife shared that if transportation will not take him with current oxygen requirements, the she will take him home herself. Dr. Kurtis Bushman also confirmed that pt's wife realizes that pt may pass while in transport.    Considering pt could potentially leave AMA I contacted the patient's daughter Lynelle Smoke again.  Shared my concerns that the patient and patient's wife do not fully understand his current prognosis.  Tammy again shared that the patient and the  patient's wife are confused and likely do not understand the full picture.  Tammy shares she is a realist and does not understand how it would be possible for her dad to be brought home if he is not able to be sustained on the amount of oxygen able to be provided at home.  She shared that perhaps Linda's daughter Terrence Dupont could better explain and give [name] have a more clear appreciation that taking the patient home without adequate oxygen would not be in the patient's best interest.    Two attempts made to speak  with Terrence Dupont on the phone.  No answer.  No voicemail available to leave a message.   Afternoon rounds:  I returned to bedside this afternoon to reassess the patient. [name] and Linda's daughter Terrence Dupont were at bedside.  Comfort measures and avoiding aggressive measures discussed.  Terrence Dupont and patient's wife in agreement to move forward with comfort measures.  I highlighted in great detail that we will no longer be titrating oxygenation up.  THe plan is to trial him off of the high flow nasal cannula and titrate him down to 15L - the maximum amount of oxygen the patient could be on and be able to be safely transferred home to pass away.  I shared that there is a strong likelihood that he will not be able to be stable at 15 L on nasal cannula but that we will attempt all measures while managing his symptoms.  Present in full agreement.  With nurse Jocelyn Lamer present high flow nasal cannula removed and patient monitored for 20 minutes.  Patient stats remained at 88% with no apparent distress.  Morphine and Ativan as needed available but not given.  Service today but PMT is available throughout the night and my colleague will be asked to round on the patient tomorrow.  All questions and concerns addressed.  Spiritual care chaplain offered and patient family declined at this time.  Family inquired if 82-year-old granddaughter would be able to visit.  I confirmed with administrative coordinators since patient is full  comfort measures and end-of-life that 31-year-old may visit at discretion of parents. Nursing made aware and will convey to family.   Primary Decision Maker PATIENT  Code Status/Advance Care Planning: DNR  Prognosis:   Hours to days   Discharge Planning: Anticipate hospital death  Primary Diagnoses: Present on Admission:  Sepsis due to gram-negative UTI ([place])  COPD (chronic obstructive pulmonary disease) ([place])  Colovesical fistula  Chronic hypoxemic respiratory failure (HCC)  Acute on chronic respiratory failure with hypoxemia (HCC)  Hypoxia   Physical Exam Constitutional:      Appearance: He is ill-appearing.  HENT:     Head: Normocephalic and atraumatic.     Mouth/Throat:     Mouth: Mucous membranes are dry.  Cardiovascular:     Rate and Rhythm: Tachycardia present.     Pulses: Normal pulses.  Pulmonary:     Comments: Dyspnea with talking, nonrebreather in place Abdominal:     Palpations: Abdomen is soft.  Musculoskeletal:     Comments: Generalized weakness  Skin:    General: Skin is warm and dry.  Neurological:     Mental Status: He is alert. Mental status is at baseline.  Psychiatric:     Comments: anxious    Vital Signs: BP 111/63 (BP Location: Right Arm)   Pulse 98   Temp 98.7 F (37.1 C) (Axillary)   Resp 15   Ht $R'5\' 5"'Lt$  (1.651 m)   Wt 74.8 kg   SpO2 (!) 88%   BMI 27.46 kg/m  Pain Scale: 0-10 POSS *See Group Information*: 1-Acceptable,Awake and alert Pain Score: 0-No pain SpO2: SpO2: (!) 88 % O2 Device:SpO2: (!) 88 % O2 Flow Rate: .O2 Flow Rate (L/min): 60 L/min  Palliative Assessment/Data: 20%     Thank you for this consult. Palliative medicine will continue to follow and assist holistically.   Time Total: 120 minutes  Greater than 50% of this time was spent counseling and coordinating care related to the above assessment and plan.  Signed by: Jordan Hawks, DNP,  FNP-BC Palliative Medicine  Please contact Palliative Medicine Team  phone at [phone] for questions and concerns.  For individual provider: See Shea Evans

## 2021-07-08 NOTE — Consult Note (Signed)
Pulmonary Medicine          Date: 07/08/2021,   MRN# [id number] Bobby Peterson [birth year]     AdmissionWeight: 74.8 kg                 CurrentWeight: 74.8 kg   Referring physician: Dr Kurtis Bushman   CHIEF COMPLAINT:   Acute on chronic hypoxemic respiratory failure   HISTORY OF PRESENT ILLNESS   This is a 80 yo M with hx of Advanced COPD and chronic hypoxemia, chronic diastolic CHF, Chronic Kidney disease, chronic colovesical fistula, DM2, s/p hospitalization with COVID19 viral pneumonia.  Family reports acute onset of encephalopathy on day of admission with hypotension as well as incontinent of urine and stool.  On arrival he seemed to have UTI with abnormal UA and leukocytosis. He does have AF on telemetry. He initially had IVF but overal fluid balance is negative 1.5L. Labs show CKD. Patient is somewhat uncomfortable with diarreah and is on maximal HFNC but saturation is only 85%.  He is tachycardic and despite midodrine still hypotensive.  He has pneumonia and UTI and diarreah with chronic multiorgan failure and DNR status. PCCM consultation for further evaluation and management.    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 on home O2   COPD (chronic obstructive pulmonary disease) (HCC)    COPD (chronic obstructive pulmonary disease) ([place]) 12/03/2013   May fifth 2015 simple spirometry>> ratio 45%, FEV1 0.95 L (34% per day)    Coronary artery disease     Coronary atherosclerosis 07/08/2015   Dr. Ubaldo Glassing    Esophageal reflux 42/03/7680   Hernia, umbilical    Hypercholesteremia    Hypertension    Obesity    Renal cancer ([place])    a. s/p nephrectomy in 1980's   Smoking 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 pack years 05/10/2016     SURGICAL HISTORY   Past Surgical History:  Procedure Laterality Date   CHOLECYSTECTOMY N/A 12/22/2014   Procedure: LAPAROSCOPIC CHOLECYSTECTOMY WITH INTRAOPERATIVE CHOLANGIOGRAM;  Surgeon: Christene Lye, MD;  Location: [facility] ORS;  Service: General;  Laterality: N/A;   COLONOSCOPY     ERCP N/A 01/05/2015   Procedure: ENDOSCOPIC RETROGRADE CHOLANGIOPANCREATOGRAPHY (ERCP);  Surgeon: Hulen Luster, MD;  Location: [facility] ENDOSCOPY;  Service: Endoscopy;  Laterality: N/A;   ERCP N/A 02/19/2015   Procedure: ENDOSCOPIC RETROGRADE CHOLANGIOPANCREATOGRAPHY (ERCP);  Surgeon: Hulen Luster, MD;  Location: [facility] ENDOSCOPY;  Service: Gastroenterology;  Laterality: N/A;   GALLBLADDER SURGERY  11/2014   NEPHRECTOMY  1990   OMENTECTOMY  12/22/2014   Procedure: OMENTECTOMY;  Surgeon: Christene Lye, MD;  Location: [facility] ORS;  Service: General;;  Partial omentectomy   STENT REMOVAL     UMBILICAL HERNIA REPAIR N/A 12/22/2014   Procedure: HERNIA REPAIR UMBILICAL ADULT;  Surgeon: Christene Lye, MD;  Location: [facility] ORS;  Service: General;  Laterality: N/A;     FAMILY HISTORY   Family History  Problem Relation Age of Onset   Hypertension Mother      SOCIAL HISTORY   Social History   Tobacco Use   Smoking status: Former    Packs/day: 1.00    Years: 50.00    Pack years: 50.00    Types: Cigarettes    Quit date: 12/04/2003    Years since quitting: 17.6   Smokeless tobacco: Current    Types: Chew   Tobacco comments:    Has been chewing tobacco since quitting smoking in 2005.  Substance Use Topics   Alcohol use: No   Drug use: No     MEDICATIONS    Home Medication:    Current Medication:  Current Facility-Administered  Medications:    acetaminophen (TYLENOL) tablet 650 mg, 650 mg, Oral, Q6H PRN **OR** acetaminophen (TYLENOL) suppository 650 mg, 650 mg, Rectal, Q6H PRN, Mansy, Jan A, MD   albuterol (PROVENTIL) (2.5 MG/3ML) 0.083% nebulizer solution 2.5 mg, 2.5 mg, Nebulization, Q6H PRN, Rauer, Forde Dandy, RPH   aspirin EC tablet 81 mg, 81 mg, Oral, Daily, Mansy, Jan A, MD, 81 mg at 07/07/21 0924   enoxaparin (LOVENOX) injection 40 mg, 40 mg, Subcutaneous, Q24H, Mansy, Jan A, MD, 40 mg at 07/07/21 0730   gabapentin (NEURONTIN) capsule 300 mg, 300 mg, Oral, BID, Mansy, Jan A, MD, 300 mg at 07/07/21 2234   hydrocortisone sodium succinate (SOLU-CORTEF) 100 MG injection 100 mg, 100 mg, Intravenous, Q12H, Kurtis Bushman, Gwynneth Albright, MD, 100 mg at 07/08/21 0454   ipratropium-albuterol (DUONEB) 0.5-2.5 (3) MG/3ML nebulizer solution 3 mL, 3 mL, Nebulization, Q6H PRN, Mansy, Jan A, MD, 3 mL at 07/06/21 1226   magnesium hydroxide (MILK OF MAGNESIA) suspension 30 mL, 30 mL, Oral, Daily PRN, Mansy, Jan A, MD   midodrine (PROAMATINE) tablet 5 mg, 5 mg, Oral, TID WC, Kurtis Bushman, Sahar, MD, 5 mg at 07/07/21 1727   ondansetron (ZOFRAN) tablet 4 mg, 4 mg, Oral, Q6H PRN **OR** ondansetron (ZOFRAN) injection 4 mg, 4 mg, Intravenous, Q6H PRN, Mansy, Jan A, MD   pantoprazole (PROTONIX) EC tablet 40 mg, 40 mg, Oral, Daily, Mansy, Jan A, MD, 40 mg at 07/07/21 0737   piperacillin-tazobactam (ZOSYN) IVPB 3.375 g, 3.375 g, Intravenous, Q8H, Mansy, Jan A, MD, Last Rate: 12.5 mL/hr at 07/08/21 0308, 3.375 g at 07/08/21 0308   pravastatin (PRAVACHOL) tablet 20 mg, 20 mg, Oral, Daily, Mansy, Jan A, MD, 20 mg at 07/07/21 1062   tamsulosin (FLOMAX) capsule 0.4 mg, 0.4 mg, Oral, Daily, Mansy, Jan A, MD, 0.4 mg at 07/07/21 6948   traZODone (DESYREL) tablet 25 mg, 25 mg, Oral, QHS PRN, Mansy, Arvella Merles, MD   [START ON July 14, 2021] vitamin B-12 (CYANOCOBALAMIN) tablet 1,000 mcg, 1,000 mcg, Oral, Weekly, Mansy, Jan A, MD    ALLERGIES   Fenofibrate, Lisinopril, Niacin,  Phenergan [promethazine hcl], and Simvastatin     REVIEW OF SYSTEMS    Review of Systems:  Gen:  Denies  fever, sweats, chills weigh loss  HEENT: Denies blurred vision, double vision, ear pain, eye pain, hearing loss, nose bleeds, sore throat Cardiac:  No dizziness, chest pain or heaviness, chest tightness,edema Resp:   Denies cough or sputum porduction, shortness of breath,wheezing, hemoptysis,  Gi: Denies swallowing difficulty, stomach pain, nausea or vomiting, diarrhea, constipation, bowel incontinence Gu:  Denies bladder incontinence, burning urine Ext:   Denies Joint pain, stiffness or swelling Skin: Denies  skin rash, easy bruising or bleeding or hives Endoc:  Denies polyuria, polydipsia , polyphagia or weight change Psych:   Denies  depression, insomnia or hallucinations   Other:  All other systems negative   VS: BP 111/63 (BP Location: Right Arm)   Pulse 98   Temp 98.7 F (37.1 C) (Axillary)   Resp 15   Ht 5\' 5"  (1.651 m)   Wt 74.8 kg   SpO2 (!) 88%   BMI 27.46 kg/m      PHYSICAL EXAM    GENERAL:NAD, no fevers, chills, no weakness no fatigue HEAD: Normocephalic, atraumatic.  EYES: Pupils equal, round, reactive to light. Extraocular muscles intact. No scleral icterus.  MOUTH: Moist mucosal membrane. Dentition intact. No abscess noted.  EAR, NOSE, THROAT: Clear without exudates. No external lesions.  NECK: Supple. No thyromegaly. No nodules. No JVD.  PULMONARY: Diffuse coarse rhonchi right sided +wheezes CARDIOVASCULAR: S1 and S2. Regular rate and rhythm. No murmurs, rubs, or gallops. No edema. Pedal pulses 2+ bilaterally.  GASTROINTESTINAL: Soft, nontender, nondistended. No masses. Positive bowel sounds. No hepatosplenomegaly.  MUSCULOSKELETAL: No swelling, clubbing, or edema. Range of motion full in all extremities.  NEUROLOGIC: Cranial nerves II through XII are intact. No gross focal neurological deficits. Sensation intact. Reflexes intact.  SKIN: No  ulceration, lesions, rashes, or cyanosis. Skin warm and dry. Turgor intact.  PSYCHIATRIC: Mood, affect within normal limits. The patient is awake, alert and oriented x 3. Insight, judgment intact.       IMAGING    DG Chest 2 View  Result Date: 06/28/2021 CLINICAL DATA:  Flu symptoms.  COPD and heart failure. EXAM: CHEST - 2 VIEW COMPARISON:  09/21/2018 FINDINGS: Hyperinflation. Osteopenia. Midline trachea. Borderline cardiomegaly. Atherosclerosis in the transverse aorta. No pleural effusion or pneumothorax. Lower lung predominant interstitial thickening. No lobar consolidation. Bibasilar scarring. IMPRESSION: No evidence of pneumonia. COPD as evidenced by hyperinflation and basilar predominant interstitial thickening. Aortic Atherosclerosis (ICD10-I70.0). Electronically Signed   By: Abigail Miyamoto M.D.   On: 06/28/2021 10:53   CT Abdomen Pelvis W Contrast  Result Date: 06/28/2021 CLINICAL DATA:  Moderate-sized hiatal hernia. EXAM: CT ABDOMEN AND PELVIS WITH CONTRAST TECHNIQUE: Multidetector CT imaging of the abdomen and pelvis was performed using the standard protocol following bolus administration of intravenous contrast. CONTRAST:  42mL OMNIPAQUE IOHEXOL 300 MG/ML  SOLN COMPARISON:  CT with IV contrast 12/08/2017, CT with oral contrast 01/12/2015. FINDINGS: Lower chest: There small areas of peripheral consolidation concerning for multifocal pneumonia in the lung bases, including in the anterior and posteromedial right lower lobe, the anterior right middle lobe, and the anterior base of the left lower lobe. There is mild cardiomegaly increased from prior studies but no pericardial effusion. There is a moderate-sized hiatal hernia. A calcified granuloma is again noted in the left lower lobe. Hepatobiliary: The liver is normal in size and mildly steatotic included with focal fat in the left lobe chronically noted as well as left hepatic pneumobilia with partially air distended common bile duct post  cholecystectomy. There is chronic prominence of the common bile duct, measuring 10 mm. There is a 1.5 x 0.8 cm nodular lesion along the posterolateral aspect of the right lobe of the liver which would either represent a subcapsular lesion or a peritoneal lesion, previously 1.3 x 0.8 cm. Pancreas: No mass enhancement or ductal dilatation. Spleen: Calcified granulomas are again seen. No splenomegaly or mass enhancement. 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 There is mild chronic nodular thickening of the left adrenal gland with left kidney again demonstrating a 2.1 cm cyst in the inferior  pole and few tiny lower pole cortical hypodensities which are stable but too small to fully characterize. There are no appreciable stones or hydronephrosis. Evaluation of the bladder again demonstrates a direct fistula to the mid sigmoid colon along the left anterior wall with moderate generalized thickening of the bladder and perivesical stranding consistent with cystitis. A large bladder stone has developed measuring 4.0 x 3.3 by 2.7 cm, and there is air in the anterior bladder. Stomach/Bowel: Unremarkable contracted stomach and small bowel. The appendix is normal caliber, well visible. There are diffuse colonic diverticula greatest in the sigmoid with increased stranding along the mid third of the sigmoid colon including the portion communicating with the left anterior bladder wall, findings consistent with acute sigmoid diverticulitis. There is no free air or abscess. Vascular/Lymphatic: Aortic and branch vessel atherosclerosis. No enlarged abdominal or pelvic lymph nodes. Reproductive: Enlarged prostate 5 cm transverse, previously 4.7 cm. Other: No free air, hemorrhage or fluid. Small umbilical and inguinal fat hernias. Musculoskeletal: There is osteopenia with degenerative changes of the thoracic and lumbar spine but no worrisome  regional skeletal lesion. IMPRESSION: 1. Peripheral opacities in the lung bases most likely due to multifocal pneumonia. 2. As in 2019 there is direct fistulous communication between the mid sigmoid colon and left anterior bladder wall with air in the bladder, moderate bladder thickening and adjacent stranding consistent with cystitis, and increased stranding around the mid sigmoid colon consistent with at least a mild acute diverticulitis. No free air or abscess is seen. 3. A large stone has developed in the dependent portion of the bladder measuring up to 4 cm. 4. 1.5 x 0.8 cm nodular lesion adjacent the posterolateral right hepatic lobe, only slightly larger than on the prior studies, probable benign process but PET-CT may be indicated given the slight interval change. This would either be a subcapsular protruding liver lesion or a peritoneal lesion abutting the liver capsule. 5. Prostatomegaly and remaining findings discussed above. Electronically Signed   By: Telford Nab M.D.   On: 06/28/2021 20:50   DG Chest Port 1 View  Result Date: 07/06/2021 CLINICAL DATA:  Acute hypoxemic respiratory failure. Diagnosed with COVID-19 infection 8 days prior. EXAM: PORTABLE CHEST 1 VIEW COMPARISON:  Radiographs 07/17/2021, 07/03/2021 and 06/28/2021. CT abdomen 06/28/2021. FINDINGS: 1426 hours. The heart size and mediastinal contours are stable with aortic atherosclerosis. Probable new small left pleural effusion with increased patchy airspace disease at the left lung base. Patchy right basilar opacity is unchanged from the recent prior studies, although increased from older prior studies. No consolidation, edema or pneumothorax. The bones appear unremarkable. Telemetry leads overlie the chest. IMPRESSION: Patchy bibasilar airspace opacities, with slight interval worsening on the left and a probable small left pleural effusion. Findings are nonspecific, although in this setting suspicious for mild viral pneumonia.  Electronically Signed   By: Richardean Sale M.D.   On: 07/06/2021 14:44   DG Chest Portable 1 View  Result Date: 07/18/2021 CLINICAL DATA:  Cough and confusion, initial encounter EXAM: PORTABLE CHEST 1 VIEW COMPARISON:  07/03/2021 FINDINGS: Cardiac shadow is stable. Aortic calcifications are noted. Lungs are well aerated bilaterally with mild bibasilar atelectasis. No sizable effusion is seen. No bony abnormality is noted. IMPRESSION: Mild bibasilar atelectasis. Electronically Signed   By: Inez Catalina M.D.   On: 07/26/2021 23:35   DG Chest Port 1 View  Result Date: 07/03/2021 CLINICAL DATA:  Pneumonia due to COVID-19 virus EXAM: PORTABLE CHEST 1 VIEW COMPARISON:  06/28/2021 FINDINGS: Ill-defined patchy density  superimposed on chronic interstitial changes of COPD. No significant pleural effusion. No pneumothorax. Stable cardiomediastinal contours. IMPRESSION: Patchy increased density superimposed on chronic interstitial changes may reflect atypical/viral pneumonia. Electronically Signed   By: Macy Mis M.D.   On: 07/03/2021 12:35      ASSESSMENT/PLAN   Acute on chronic hypoxemic respiratory failure - patient appears to be uncomfortable with multifocal pneumonia and severe hypoxemia -he is with severe comorbid conditions and even with aggressive care he will unlikely do well - I agree with empiric abx -agree with palliative care evaluation  -reviewed medical plan with attending physician Dr Kurtis Bushman - will have goals of care discussion and likely transition to comfort measures -based on family wishes medical plan may be modified -Reivewed with respiratory therapist and RN - patient at high risk of death - SpO2<86% on HFNC 99%50L/min  Advanced COPD with chronic hypoxemia  - continue incentive spirometry  - abx  - nebulizer therapy   UTI/Diarreah/Sepsis/septic shock    - per primary team     - patient with severe comoribid conditions with palliative care for [place] and medical plan        Thank you for allowing me to participate in the care of this patient.  Total face to face encounter time for this patient visit was >63min. >50% of the time was  spent in counseling and coordination of care.   Patient/Family are satisfied with care plan and all questions have been answered.  This document was prepared using Dragon voice recognition software and may include unintentional dictation errors.     Ottie Glazier, M.D.  Division of [place]

## 2021-07-09 DIAGNOSIS — Z515 Encounter for palliative care: Secondary | ICD-10-CM

## 2021-07-09 DIAGNOSIS — Z7189 Other specified counseling: Secondary | ICD-10-CM

## 2021-07-09 DIAGNOSIS — N390 Urinary tract infection, site not specified: Secondary | ICD-10-CM | POA: Diagnosis not present

## 2021-07-09 DIAGNOSIS — A4150 Gram-negative sepsis, unspecified: Secondary | ICD-10-CM | POA: Diagnosis not present

## 2021-07-09 MED ORDER — MORPHINE 100MG IN NS 100ML (1MG/ML) PREMIX INFUSION
2.0000 mg/h | INTRAVENOUS | Status: DC
  Administered 2021-07-09 – 2021-07-10 (×2): 2 mg/h via INTRAVENOUS
  Filled 2021-07-09 (×4): qty 100

## 2021-07-09 NOTE — Plan of Care (Signed)
  Problem: Safety: Goal: Ability to remain free from injury will improve Outcome: Progressing   Problem: Skin Integrity: Goal: Risk for impaired skin integrity will decrease Outcome: Progressing   Problem: Respiratory: Goal: Will maintain a patent airway Outcome: Progressing

## 2021-07-09 NOTE — Progress Notes (Signed)
Pulmonary Medicine          Date: 07/09/2021,   MRN# [id number] Bobby Peterson 07-Feb-1941     AdmissionWeight: 74.8 kg                 CurrentWeight: 74.8 kg   Referring physician: Dr Kurtis Bushman   CHIEF COMPLAINT:   Acute on chronic hypoxemic respiratory failure   HISTORY OF PRESENT ILLNESS   This is a 80 yo M with hx of Advanced COPD and chronic hypoxemia, chronic diastolic CHF, Chronic Kidney disease, chronic colovesical fistula, DM2, s/p hospitalization with COVID19 viral pneumonia.  Family reports acute onset of encephalopathy on day of admission with hypotension as well as incontinent of urine and stool.  On arrival he seemed to have UTI with abnormal UA and leukocytosis. He does have AF on telemetry. He initially had IVF but overal fluid balance is negative 1.5L. Labs show CKD. Patient is somewhat uncomfortable with diarreah and is on maximal HFNC but saturation is only 85%.  He is tachycardic and despite midodrine still hypotensive.  He has pneumonia and UTI and diarreah with chronic multiorgan failure and DNR status. PCCM consultation for further evaluation and management.   07/09/21- patient is being seen by hospice. Pulmonary will sign off , we are available if hospice services requires evaluation.    PAST MEDICAL HISTORY   Past Medical History:  Diagnosis Date   Acute respiratory failure with hypoxia ([place]) 12/03/2013   Overview:  Overview:  2 L O2 continuously  Last Assessment & Plan:  Continue 2 L O2 continuously    Atrial tachycardia ([place]) 04/21/2016   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   BP (high blood pressure) 07/08/2015   Chronic diastolic CHF (congestive heart failure) (HCC)    Chronic diastolic heart failure ([place]) 07/08/2015   Chronic hypoxemic respiratory failure (HCC) 12/03/2013   2 L O2 continuously    Chronic respiratory failure (HCC)    a. on home O2   COPD (chronic obstructive pulmonary disease) (HCC)    COPD (chronic obstructive pulmonary disease)  ([place]) 12/03/2013   May fifth 2015 simple spirometry>> ratio 45%, FEV1 0.95 L (34% per day)    Coronary artery disease    Coronary atherosclerosis 07/08/2015   Dr. Ubaldo Glassing    Esophageal reflux 30/08/6008   Hernia, umbilical    Hypercholesteremia    Hypertension    Obesity    Renal cancer ([place])    a. s/p nephrectomy in 1980's   Smoking 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 pack years 05/10/2016     SURGICAL HISTORY   Past Surgical History:  Procedure Laterality Date   CHOLECYSTECTOMY N/A 12/22/2014   Procedure: LAPAROSCOPIC CHOLECYSTECTOMY WITH INTRAOPERATIVE CHOLANGIOGRAM;  Surgeon: Christene Lye, MD;  Location: [facility] ORS;  Service: General;  Laterality: N/A;   COLONOSCOPY     ERCP N/A 01/05/2015   Procedure: ENDOSCOPIC RETROGRADE CHOLANGIOPANCREATOGRAPHY (ERCP);  Surgeon: Hulen Luster, MD;  Location: [facility] ENDOSCOPY;  Service: Endoscopy;  Laterality: N/A;   ERCP N/A 02/19/2015   Procedure: ENDOSCOPIC RETROGRADE CHOLANGIOPANCREATOGRAPHY (ERCP);  Surgeon: Hulen Luster, MD;  Location: [facility] ENDOSCOPY;  Service: Gastroenterology;  Laterality: N/A;   GALLBLADDER SURGERY  11/2014   NEPHRECTOMY  1990   OMENTECTOMY  12/22/2014   Procedure: OMENTECTOMY;  Surgeon: Christene Lye, MD;  Location: [facility] ORS;  Service: General;;  Partial omentectomy   STENT REMOVAL     UMBILICAL HERNIA REPAIR N/A 12/22/2014   Procedure: HERNIA REPAIR UMBILICAL ADULT;  Surgeon: Andreas Newport  Jamal Collin, MD;  Location: [facility] ORS;  Service: General;  Laterality: N/A;     FAMILY HISTORY   Family History  Problem Relation Age of Onset   Hypertension Mother      SOCIAL HISTORY   Social History   Tobacco Use   Smoking status: Former    Packs/day: 1.00    Years: 50.00    Pack years: 50.00    Types: Cigarettes    Quit date: 12/04/2003    Years since quitting: 17.6   Smokeless tobacco: Current    Types: Chew   Tobacco comments:    Has been chewing tobacco since quitting smoking in 2005.  Substance Use Topics   Alcohol use: No    Drug use: No     MEDICATIONS    Home Medication:    Current Medication:  Current Facility-Administered Medications:    acidophilus (RISAQUAD) capsule 2 capsule, 2 capsule, Oral, TID, Nolberto Hanlon, MD, 2 capsule at 07/08/21 2157   glycopyrrolate (ROBINUL) tablet 1 mg, 1 mg, Oral, Q4H PRN **OR** glycopyrrolate (ROBINUL) injection 0.2 mg, 0.2 mg, Subcutaneous, Q4H PRN **OR** glycopyrrolate (ROBINUL) injection 0.2 mg, 0.2 mg, Intravenous, Q4H PRN, Jordan Hawks, FNP   haloperidol (HALDOL) tablet 0.5 mg, 0.5 mg, Oral, Q4H PRN **OR** haloperidol (HALDOL) 2 MG/ML solution 0.5 mg, 0.5 mg, Sublingual, Q4H PRN **OR** haloperidol lactate (HALDOL) injection 0.5 mg, 0.5 mg, Intravenous, Q4H PRN, Jordan Hawks, FNP, 0.5 mg at 07/08/21 2141   LORazepam (ATIVAN) injection 1 mg, 1 mg, Intravenous, Q4H PRN, Jordan Hawks, FNP, 1 mg at 07/09/21 1572   morphine 100mg  in NS 168mL (1mg /mL) infusion - premix, 2 mg/hr, Intravenous, Continuous, Sharion Settler, NP, Last Rate: 2 mL/hr at 07/09/21 0337, 2 mg/hr at 07/09/21 6203   morphine 2 MG/ML injection 1 mg, 1 mg, Intravenous, PRN, Jordan Hawks, FNP, 1 mg at 07/09/21 0202   [DISCONTINUED] ondansetron (ZOFRAN) tablet 4 mg, 4 mg, Oral, Q6H PRN **OR** ondansetron (ZOFRAN) injection 4 mg, 4 mg, Intravenous, Q6H PRN, Mansy, Jan A, MD   polyvinyl alcohol (LIQUIFILM TEARS) 1.4 % ophthalmic solution 1 drop, 1 drop, Both Eyes, QID PRN, Jordan Hawks, FNP    ALLERGIES   Fenofibrate, Lisinopril, Niacin, Phenergan [promethazine hcl], and Simvastatin     REVIEW OF SYSTEMS    Review of Systems:  Gen:  Denies  fever, sweats, chills weigh loss  HEENT: Denies blurred vision, double vision, ear pain, eye pain, hearing loss, nose bleeds, sore throat Cardiac:  No dizziness, chest pain or heaviness, chest tightness,edema Resp:   Denies cough or sputum porduction, shortness of breath,wheezing, hemoptysis,  Gi: Denies swallowing difficulty, stomach pain, nausea or  vomiting, diarrhea, constipation, bowel incontinence Gu:  Denies bladder incontinence, burning urine Ext:   Denies Joint pain, stiffness or swelling Skin: Denies  skin rash, easy bruising or bleeding or hives Endoc:  Denies polyuria, polydipsia , polyphagia or weight change Psych:   Denies depression, insomnia or hallucinations   Other:  All other systems negative   VS: BP 120/81 (BP Location: Left Arm)   Pulse (!) 141   Temp 97.8 F (36.6 C) (Axillary)   Resp (!) 21   Ht 5\' 5"  (1.651 m)   Wt 74.8 kg   SpO2 91%   BMI 27.46 kg/m      PHYSICAL EXAM    GENERAL:NAD, no fevers, chills, no weakness no fatigue HEAD: Normocephalic, atraumatic.  EYES: Pupils equal, round, reactive to light. Extraocular muscles intact. No scleral icterus.  MOUTH: Moist mucosal membrane. Poor  dentition EAR, NOSE, THROAT: Clear without exudates. No external lesions.  NECK: Supple. No thyromegaly. No nodules. No JVD.  PULMONARY: Diffuse coarse rhonchi bilaterally  CARDIOVASCULAR: S1 and S2. Regular rate and rhythm. No murmurs, rubs, or gallops. No edema. Pedal pulses 2+ bilaterally.  GASTROINTESTINAL: Soft, nontender, nondistended. No masses. Positive bowel sounds. No hepatosplenomegaly. +diarreah MUSCULOSKELETAL: No swelling, clubbing, or edema. Range of motion full in all extremities.  NEUROLOGIC: mild encephalopathy SKIN: No ulceration, lesions, rashes, or cyanosis. Skin warm and dry. Turgor intact.  PSYCHIATRIC: +confusion      IMAGING    DG Chest 2 View  Result Date: 06/28/2021 CLINICAL DATA:  Flu symptoms.  COPD and heart failure. EXAM: CHEST - 2 VIEW COMPARISON:  09/21/2018 FINDINGS: Hyperinflation. Osteopenia. Midline trachea. Borderline cardiomegaly. Atherosclerosis in the transverse aorta. No pleural effusion or pneumothorax. Lower lung predominant interstitial thickening. No lobar consolidation. Bibasilar scarring. IMPRESSION: No evidence of pneumonia. COPD as evidenced by  hyperinflation and basilar predominant interstitial thickening. Aortic Atherosclerosis (ICD10-I70.0). Electronically Signed   By: Abigail Miyamoto M.D.   On: 06/28/2021 10:53   CT Abdomen Pelvis W Contrast  Result Date: 06/28/2021 CLINICAL DATA:  Moderate-sized hiatal hernia. EXAM: CT ABDOMEN AND PELVIS WITH CONTRAST TECHNIQUE: Multidetector CT imaging of the abdomen and pelvis was performed using the standard protocol following bolus administration of intravenous contrast. CONTRAST:  7mL OMNIPAQUE IOHEXOL 300 MG/ML  SOLN COMPARISON:  CT with IV contrast 12/08/2017, CT with oral contrast 01/12/2015. FINDINGS: Lower chest: There small areas of peripheral consolidation concerning for multifocal pneumonia in the lung bases, including in the anterior and posteromedial right lower lobe, the anterior right middle lobe, and the anterior base of the left lower lobe. There is mild cardiomegaly increased from prior studies but no pericardial effusion. There is a moderate-sized hiatal hernia. A calcified granuloma is again noted in the left lower lobe. Hepatobiliary: The liver is normal in size and mildly steatotic included with focal fat in the left lobe chronically noted as well as left hepatic pneumobilia with partially air distended common bile duct post cholecystectomy. There is chronic prominence of the common bile duct, measuring 10 mm. There is a 1.5 x 0.8 cm nodular lesion along the posterolateral aspect of the right lobe of the liver which would either represent a subcapsular lesion or a peritoneal lesion, previously 1.3 x 0.8 cm. Pancreas: No mass enhancement or ductal dilatation. Spleen: Calcified granulomas are again seen. No splenomegaly or mass enhancement. 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 There is mild chronic nodular thickening of the left adrenal gland with left kidney again demonstrating a 2.1 cm cyst in the  inferior pole and few tiny lower pole cortical hypodensities which are stable but too small to fully characterize. There are no appreciable stones or hydronephrosis. Evaluation of the bladder again demonstrates a direct fistula to the mid sigmoid colon along the left anterior wall with moderate generalized thickening of the bladder and perivesical stranding consistent with cystitis. A large bladder stone has developed measuring 4.0 x 3.3 by 2.7 cm, and there is air in the anterior bladder. Stomach/Bowel: Unremarkable contracted stomach and small bowel. The appendix is normal caliber, well visible. There are diffuse colonic diverticula greatest in the sigmoid with increased stranding along the mid third of the sigmoid colon including the portion communicating with the left anterior bladder wall, findings consistent with acute sigmoid diverticulitis. There is no free air or abscess. Vascular/Lymphatic: Aortic and  branch vessel atherosclerosis. No enlarged abdominal or pelvic lymph nodes. Reproductive: Enlarged prostate 5 cm transverse, previously 4.7 cm. Other: No free air, hemorrhage or fluid. Small umbilical and inguinal fat hernias. Musculoskeletal: There is osteopenia with degenerative changes of the thoracic and lumbar spine but no worrisome regional skeletal lesion. IMPRESSION: 1. Peripheral opacities in the lung bases most likely due to multifocal pneumonia. 2. As in 2019 there is direct fistulous communication between the mid sigmoid colon and left anterior bladder wall with air in the bladder, moderate bladder thickening and adjacent stranding consistent with cystitis, and increased stranding around the mid sigmoid colon consistent with at least a mild acute diverticulitis. No free air or abscess is seen. 3. A large stone has developed in the dependent portion of the bladder measuring up to 4 cm. 4. 1.5 x 0.8 cm nodular lesion adjacent the posterolateral right hepatic lobe, only slightly larger than on the  prior studies, probable benign process but PET-CT may be indicated given the slight interval change. This would either be a subcapsular protruding liver lesion or a peritoneal lesion abutting the liver capsule. 5. Prostatomegaly and remaining findings discussed above. Electronically Signed   By: Telford Nab M.D.   On: 06/28/2021 20:50   DG Chest Port 1 View  Result Date: 07/06/2021 CLINICAL DATA:  Acute hypoxemic respiratory failure. Diagnosed with COVID-19 infection 8 days prior. EXAM: PORTABLE CHEST 1 VIEW COMPARISON:  Radiographs 07/03/2021, 07/03/2021 and 06/28/2021. CT abdomen 06/28/2021. FINDINGS: 1426 hours. The heart size and mediastinal contours are stable with aortic atherosclerosis. Probable new small left pleural effusion with increased patchy airspace disease at the left lung base. Patchy right basilar opacity is unchanged from the recent prior studies, although increased from older prior studies. No consolidation, edema or pneumothorax. The bones appear unremarkable. Telemetry leads overlie the chest. IMPRESSION: Patchy bibasilar airspace opacities, with slight interval worsening on the left and a probable small left pleural effusion. Findings are nonspecific, although in this setting suspicious for mild viral pneumonia. Electronically Signed   By: Richardean Sale M.D.   On: 07/06/2021 14:44   DG Chest Portable 1 View  Result Date: 07/28/2021 CLINICAL DATA:  Cough and confusion, initial encounter EXAM: PORTABLE CHEST 1 VIEW COMPARISON:  07/03/2021 FINDINGS: Cardiac shadow is stable. Aortic calcifications are noted. Lungs are well aerated bilaterally with mild bibasilar atelectasis. No sizable effusion is seen. No bony abnormality is noted. IMPRESSION: Mild bibasilar atelectasis. Electronically Signed   By: Inez Catalina M.D.   On: 07/30/2021 23:35   DG Chest Port 1 View  Result Date: 07/03/2021 CLINICAL DATA:  Pneumonia due to COVID-19 virus EXAM: PORTABLE CHEST 1 VIEW COMPARISON:   06/28/2021 FINDINGS: Ill-defined patchy density superimposed on chronic interstitial changes of COPD. No significant pleural effusion. No pneumothorax. Stable cardiomediastinal contours. IMPRESSION: Patchy increased density superimposed on chronic interstitial changes may reflect atypical/viral pneumonia. Electronically Signed   By: Macy Mis M.D.   On: 07/03/2021 12:35      ASSESSMENT/PLAN   Acute on chronic hypoxemic respiratory failure - patient appears to be uncomfortable with multifocal pneumonia and severe hypoxemia -he is with severe comorbid conditions and even with aggressive care he will unlikely do well - I agree with empiric abx -agree with palliative care evaluation  -reviewed medical plan with attending physician Dr Kurtis Bushman - will have goals of care discussion and likely transition to comfort measures with hospice services -Reivewed with respiratory therapist and RN - patient at high risk of death - in hospital  Advanced COPD with chronic hypoxemia  - continue incentive spirometry  - abx  - nebulizer therapy   UTI/Diarreah/Sepsis/septic shock    - per primary team     - patient with severe comoribid conditions with palliative care for [place] and medical plan       Thank you for allowing me to participate in the care of this patient.   Patient/Family are satisfied with care plan and all questions have been answered.  This document was prepared using Dragon voice recognition software and may include unintentional dictation errors.     Ottie Glazier, M.D.  Division of [place]

## 2021-07-09 NOTE — Progress Notes (Signed)
PROGRESS NOTE    Bobby Peterson  WUJ:[id number] DOB: 1941-03-12 DOA: 07/10/2021 PCP: Virginia Crews, MD    Brief Narrative:  Bobby Peterson is a 80 y.o. Caucasian male with medical history significant for COPD on home O2 at 2 L/min, diastolic CHF, stage III chronic kidney disease, type II diabetes mellitus, coronary artery disease, colovesical fistula and recent COVID pneumonia and acute on chronic respiratory failure for which she was admitted here on 11/29 and discharged on 12/3.  He did fairly well after his discharge, per his wife but unfortunately became more confused over the last 24 hours and has developed visual hallucinations.  He had urinary and stool incontinence.  He did not have any reported chest pain or dyspnea or cough or wheezing.  No fever or chills.  No nausea or vomiting or abdominal pain were reported.   ED Course: Upon presentation to the emergency room, vital signs were within normal with a pulse ox of 97% on 2 L of O2 by nasal cannula.  Labs revealed BUN of 44 and creatinine of 1.38.  And previous levels with potassium 5 and total protein 6.3 and albumin of 3.1.  CBC showed leukocytosis 16.8.  UA was positive for UTI.[name]  12/7 pt on HF . Appears tired.wife reports thinks he is little better. Pt unable to really tell me if getting better. Wife said "they gave him 3 months". Tried explaining to her that we need to see how he is going to do next 48 hrs.   12/8 I called RR as pt was requiring more 02. Spoke to PCCm this am, pt with poor px with current hyoxemia/pna, severe comorbid condtions and advanced age. Hospice was consulted.  Had a long discussion with wife and pts daughter , they are made aware of pts px.  PCCM and I agree to transition pt to comfort measures/hospice. Wife does not want hospice house, wants to take pt home with hospice.  12/9 on morphine gtt. Family at bedside, snoring  Consultants:  PCCM  Procedures:   Antimicrobials:  zosyn     Subjective: On HF FM.   Objective: Vitals:   07/08/21 1108 07/08/21 1248 07/08/21 1416 07/08/21 2339  BP: 101/63 111/68  120/81  Pulse:  (!) 108  (!) 141  Resp: (!) 21 (!) 21    Temp: 98.6 F (37 C) 97.8 F (36.6 C)    TempSrc: Axillary Axillary    SpO2: (!) 87% (!) 88% (!) 89% 91%  Weight:      Height:        Intake/Output Summary (Last 24 hours) at 07/09/2021 0950 Last data filed at 07/09/2021 0400 Gross per 24 hour  Intake 0.72 ml  Output --  Net 0.72 ml   Filed Weights   07/03/2021 2303  Weight: 74.8 kg    Examination: Sleeping, snoring Rhonchi Reg s1/s2 no gallop Soft benign +bs No edema   Data Reviewed: I have personally reviewed following labs and imaging studies  CBC: Recent Labs  Lab 07/11/2021 2315 07/06/21 0257 07/07/21 0639  WBC 16.5* 14.6* 18.2*  NEUTROABS 14.5*  --  14.5*  HGB 13.9 13.8 14.3  HCT 43.7 43.3 45.0  MCV 89.7 88.7 88.9  PLT 282 271 956   Basic Metabolic Panel: Recent Labs  Lab 07/01/2021 2315 07/06/21 0257 07/07/21 0639  NA 137 141 139  K 5.0 4.7 4.3  CL 101 101 96*  CO2 28 32 32  GLUCOSE 187* 159* 89  BUN 44* 44* 38*  CREATININE 1.38* 1.43* 1.62*  CALCIUM 9.4 9.4 9.0  MG  --  2.4 2.3   GFR: Estimated Creatinine Clearance: 33.2 mL/min (A) (by C-G formula based on SCr of 1.62 mg/dL (H)). Liver Function Tests: Recent Labs  Lab 07/06/2021 2315  AST 15  ALT 14  ALKPHOS 64  BILITOT 1.0  PROT 6.3*  ALBUMIN 3.1*   No results for input(s): LIPASE, AMYLASE in the last 168 hours. No results for input(s): AMMONIA in the last 168 hours. Coagulation Profile: Recent Labs  Lab 07/06/21 0257  INR 1.1   Cardiac Enzymes: No results for input(s): CKTOTAL, CKMB, CKMBINDEX, TROPONINI in the last 168 hours. BNP (last 3 results) No results for input(s): PROBNP in the last 8760 hours. HbA1C: No results for input(s): HGBA1C in the last 72 hours. CBG: Recent Labs  Lab 07/02/21 1139 07/02/21 1638 07/02/21 2022  07/03/21 0729 07/03/21 1146  GLUCAP 105* 135* 161* 111* 119*   Lipid Profile: No results for input(s): CHOL, HDL, LDLCALC, TRIG, CHOLHDL, LDLDIRECT in the last 72 hours. Thyroid Function Tests: No results for input(s): TSH, T4TOTAL, FREET4, T3FREE, THYROIDAB in the last 72 hours.  Anemia Panel: No results for input(s): VITAMINB12, FOLATE, FERRITIN, TIBC, IRON, RETICCTPCT in the last 72 hours. Sepsis Labs: Recent Labs  Lab 07/06/21 0257  PROCALCITON <0.10    Recent Results (from the past 240 hour(s))  CULTURE, BLOOD (ROUTINE X 2) w Reflex to ID Panel     Status: None (Preliminary result)   Collection Time: 07/06/21  2:57 AM   Specimen: BLOOD  Result Value Ref Range Status   Specimen Description BLOOD RIGHT HAND  Final   Special Requests   Final    BOTTLES DRAWN AEROBIC ONLY Blood Culture adequate volume   Culture   Final    NO GROWTH 3 DAYS Performed at [facility], [address] [place], [place] [address]    Report Status PENDING  Incomplete  CULTURE, BLOOD (ROUTINE X 2) w Reflex to ID Panel     Status: None (Preliminary result)   Collection Time: 07/06/21  2:58 AM   Specimen: BLOOD  Result Value Ref Range Status   Specimen Description BLOOD RIGTH [facility]  Final   Special Requests   Final    BOTTLES DRAWN AEROBIC AND ANAEROBIC Blood Culture adequate volume   Culture   Final    NO GROWTH 3 DAYS Performed at [facility], [address] [place], [place] [address]    Report Status PENDING  Incomplete  MRSA Next Gen by PCR, Nasal     Status: None   Collection Time: 07/07/21 11:52 AM   Specimen: Nasal Mucosa; Nasal Swab  Result Value Ref Range Status   MRSA by PCR Next Gen NOT DETECTED NOT DETECTED Final    Comment: (NOTE) The GeneXpert MRSA Assay (FDA approved for NASAL specimens only), is one component of a comprehensive MRSA colonization surveillance program. It is not intended to diagnose MRSA infection nor to guide or monitor treatment for MRSA  infections. Test performance is not FDA approved in patients less than 46 years old. Performed at [facility], [address], [place], [place] [address]          Radiology Studies: No results found.      Scheduled Meds:  acidophilus  2 capsule Oral TID   Continuous Infusions:  morphine 2 mg/hr (07/09/21 0337)    Assessment & Plan:   Principal Problem:   Sepsis due to gram-negative UTI ([place])  Active Problems:   COPD (chronic obstructive pulmonary disease) (HCC)   Aspiration pneumonia of both lower lobes due to gastric secretions (HCC)   Chronic hypoxemic respiratory failure (HCC)   Sepsis (HCC)   Colovesical fistula   Acute on chronic respiratory failure with hypoxemia (HCC)   Hypoxia   1.  Acute metabolic encephalopathy with delirium secondary to UTI, more from hypoxemia and PNA    Patient with colovesical fistula O2 support 12/9 now on comfort care measures Zozyn d/c;d    2.  Sepsis due to UTI/PNA Continue iv zoysn Solucortif for adrenal insuff.  Was on midodrine 12/9now on comfort measures  3. Acute on chronic hypoxemic respiratory failure With recent hx/o covid Being tx for aspiration pna Wbc worse. He coughed up food debris after eating. 12/8 with diarrhea, and hypotension, lasix was discontinued. Holding ivf to prevent vol. Overload as PCCM also agrees. Currently on HF /FM.  12/9 on comfort care  4.  Type 2 diabetes mellitus with peripheral neuropathy and stage III b chronic kidney disease. Bg stable. Continue neurontin Holding metformin 12/9 on comfort care   5.  GERD. ppi  6.  Dyslipidemia. Continue statin  7.  BPH. - We will continue Flomax.  8.  COPD without exacerbation with chronic respiratory failure. - We will continue his home O2.  8.  Recent COVID-19 pneumonia. - The patient has been adequately managed and it has been 9 days since he he came here to the hospital and was positive several days before.  He has no  current respiratory symptoms.  I do not believe he needs isolation at this time.  9.  Abnormal EKG  Not afib. Lots of movement artifact , see sr, with pacs. Will monitor.       DVT prophylaxis: Lovenox  Code Status: DNR  Family Communication: wife at bedside Disposition Plan:  Status is: Inpatient  Remains inpatient appropriate because: IV treatment, patient requiring high flow oxygen.poor px   On comfort measures.           LOS: 3 days   Time spent: 20 minutes with more than 50% on COC    Nolberto Hanlon, MD [facility] Pager [phone]  If 7PM-7AM, please contact night-coverage 07/09/2021, 9:50 AM

## 2021-07-09 NOTE — Progress Notes (Signed)
Nutrition Brief Note  Chart reviewed. Pt now transitioning to comfort care.  No further nutrition interventions planned at this time.  Please re-consult as needed.   Joran Kallal W, RD, LDN, CDCES Registered Dietitian II Certified Diabetes Care and Education Specialist Please refer to [facility]ION for RD and/or RD on-call/weekend/after hours pager   

## 2021-07-09 NOTE — Progress Notes (Signed)
Nursing providing care and patient suddenly became very agitated. He is making attempts to get out of bed, grabbing nursing staff attempting to pull himself up. Patient medicated, will continue to monitor.

## 2021-07-09 NOTE — Progress Notes (Signed)
[facility]) Hospital Liaison Note  Spoke with patient's nurse. Per nurse, family has decided they are unable to manage patient at home and request patient remain in hospital. Phone call to wife [name] and daughter Lynelle Smoke to provide support and notify of bereavement services available.  [facility] will close hospice referral at this time. If plan changes hospice referral will be reopened.  Please do not hesitate to call with any hospice related questions or concerns.   Thank you,   Bobbie "Loren Racer, [place], BSN [facility] Liaison [phone]

## 2021-07-09 NOTE — Progress Notes (Signed)
Palliative: Bobby Peterson is now full comfort care.  Although he has reduced oxygen needs, family has decided that they are unable to care for him adequately at home, and prefer that he stay in the hospital.  Conference with attending, bedside nursing staff, hospice representative to, transition of care team. End-of-life order set has been implemented.  Plan: Comfort and dignity at end-of-life, let nature take its course.  End-of-life order set in place.  No charge Quinn Axe, NP Palliative medicine team Team phone [phone] Greater than 50% of this time was spent counseling and coordinating care related to the above assessment and plan.

## 2021-07-10 DIAGNOSIS — A4150 Gram-negative sepsis, unspecified: Secondary | ICD-10-CM | POA: Diagnosis not present

## 2021-07-10 DIAGNOSIS — N390 Urinary tract infection, site not specified: Secondary | ICD-10-CM | POA: Diagnosis not present

## 2021-07-10 LAB — GLUCOSE, CAPILLARY: Glucose-Capillary: 110 mg/dL — ABNORMAL HIGH (ref 70–99)

## 2021-07-10 NOTE — Plan of Care (Signed)
  Problem: Nutrition: Goal: Adequate nutrition will be maintained Outcome: Not Progressing   Problem: Coping: Goal: Level of anxiety will decrease Outcome: Not Progressing   Problem: Elimination: Goal: Will not experience complications related to bowel motility Outcome: Progressing Goal: Will not experience complications related to urinary retention Outcome: Progressing   Problem: Pain Managment: Goal: General experience of comfort will improve Outcome: Progressing   Problem: Safety: Goal: Ability to remain free from injury will improve Outcome: Progressing

## 2021-07-10 NOTE — Progress Notes (Signed)
PROGRESS NOTE    Bobby Peterson  CHY:[id number] DOB: [birth year] DOA: 07/20/2021 PCP: Virginia Crews, MD    Brief Narrative:  Bobby Peterson is a 80 y.o. Caucasian male with medical history significant for COPD on home O2 at 2 L/min, diastolic CHF, stage III chronic kidney disease, type II diabetes mellitus, coronary artery disease, colovesical fistula and recent COVID pneumonia and acute on chronic respiratory failure for which she was admitted here on 11/29 and discharged on 12/3.  He did fairly well after his discharge, per his wife but unfortunately became more confused over the last 24 hours and has developed visual hallucinations.  He had urinary and stool incontinence.  He did not have any reported chest pain or dyspnea or cough or wheezing.  No fever or chills.  No nausea or vomiting or abdominal pain were reported.   ED Course: Upon presentation to the emergency room, vital signs were within normal with a pulse ox of 97% on 2 L of O2 by nasal cannula.  Labs revealed BUN of 44 and creatinine of 1.38.  And previous levels with potassium 5 and total protein 6.3 and albumin of 3.1.  CBC showed leukocytosis 16.8.  UA was positive for UTI.[name]  12/7 pt on HF . Appears tired.wife reports thinks he is little better. Pt unable to really tell me if getting better. Wife said "they gave him 3 months". Tried explaining to her that we need to see how he is going to do next 48 hrs.   12/8 I called RR as pt was requiring more 02. Spoke to PCCm this am, pt with poor px with current hyoxemia/pna, severe comorbid condtions and advanced age. Hospice was consulted.  Had a long discussion with wife and pts daughter , they are made aware of pts px.  PCCM and I agree to transition pt to comfort measures/hospice. Wife does not want hospice house, wants to take pt home with hospice.  12/9 on morphine gtt. Family at bedside, snoring 12/10 wife at bedside.  Patient on facemask.  Consultants:   PCCM  Procedures:   Antimicrobials:  zosyn    Subjective: On facemask breathing shallow  Objective: Vitals:   07/08/21 1416 07/08/21 2339 07/09/21 1939 07/10/21 0836  BP:  120/81  (!) 86/63  Pulse:  (!) 141 (!) 148 (!) 139  Resp:   20 14  Temp:   99.2 F (37.3 C) 98.3 F (36.8 C)  TempSrc:   Oral   SpO2: (!) 89% 91% 95% 99%  Weight:      Height:        Intake/Output Summary (Last 24 hours) at 07/10/2021 0905 Last data filed at 07/09/2021 1043 Gross per 24 hour  Intake 13.44 ml  Output --  Net 13.44 ml   Filed Weights   07/28/2021 2303  Weight: 74.8 kg    Examination: Sleeping, shallow breathing Rhonchi no wheezing Regular S1-S2 No edema   Data Reviewed: I have personally reviewed following labs and imaging studies  CBC: Recent Labs  Lab 07/23/2021 2315 07/06/21 0257 07/07/21 0639  WBC 16.5* 14.6* 18.2*  NEUTROABS 14.5*  --  14.5*  HGB 13.9 13.8 14.3  HCT 43.7 43.3 45.0  MCV 89.7 88.7 88.9  PLT 282 271 878   Basic Metabolic Panel: Recent Labs  Lab 07/26/2021 2315 07/06/21 0257 07/07/21 0639  NA 137 141 139  K 5.0 4.7 4.3  CL 101 101 96*  CO2 28 32 32  GLUCOSE 187* 159* 89  BUN 44* 44* 38*  CREATININE 1.38* 1.43* 1.62*  CALCIUM 9.4 9.4 9.0  MG  --  2.4 2.3   GFR: Estimated Creatinine Clearance: 33.2 mL/min (A) (by C-G formula based on SCr of 1.62 mg/dL (H)). Liver Function Tests: Recent Labs  Lab 07/09/2021 2315  AST 15  ALT 14  ALKPHOS 64  BILITOT 1.0  PROT 6.3*  ALBUMIN 3.1*   No results for input(s): LIPASE, AMYLASE in the last 168 hours. No results for input(s): AMMONIA in the last 168 hours. Coagulation Profile: Recent Labs  Lab 07/06/21 0257  INR 1.1   Cardiac Enzymes: No results for input(s): CKTOTAL, CKMB, CKMBINDEX, TROPONINI in the last 168 hours. BNP (last 3 results) No results for input(s): PROBNP in the last 8760 hours. HbA1C: No results for input(s): HGBA1C in the last 72 hours. CBG: Recent Labs  Lab  07/03/21 1146  GLUCAP 119*   Lipid Profile: No results for input(s): CHOL, HDL, LDLCALC, TRIG, CHOLHDL, LDLDIRECT in the last 72 hours. Thyroid Function Tests: No results for input(s): TSH, T4TOTAL, FREET4, T3FREE, THYROIDAB in the last 72 hours.  Anemia Panel: No results for input(s): VITAMINB12, FOLATE, FERRITIN, TIBC, IRON, RETICCTPCT in the last 72 hours. Sepsis Labs: Recent Labs  Lab 07/06/21 0257  PROCALCITON <0.10    Recent Results (from the past 240 hour(s))  CULTURE, BLOOD (ROUTINE X 2) w Reflex to ID Panel     Status: None (Preliminary result)   Collection Time: 07/06/21  2:57 AM   Specimen: BLOOD  Result Value Ref Range Status   Specimen Description BLOOD RIGHT HAND  Final   Special Requests   Final    BOTTLES DRAWN AEROBIC ONLY Blood Culture adequate volume   Culture   Final    NO GROWTH 4 DAYS Performed at [facility], [address] [place], [place] [address]    Report Status PENDING  Incomplete  CULTURE, BLOOD (ROUTINE X 2) w Reflex to ID Panel     Status: None (Preliminary result)   Collection Time: 07/06/21  2:58 AM   Specimen: BLOOD  Result Value Ref Range Status   Specimen Description BLOOD RIGTH [facility]  Final   Special Requests   Final    BOTTLES DRAWN AEROBIC AND ANAEROBIC Blood Culture adequate volume   Culture   Final    NO GROWTH 4 DAYS Performed at [facility], [address] [place], [place] [address]    Report Status PENDING  Incomplete  MRSA Next Gen by PCR, Nasal     Status: None   Collection Time: 07/07/21 11:52 AM   Specimen: Nasal Mucosa; Nasal Swab  Result Value Ref Range Status   MRSA by PCR Next Gen NOT DETECTED NOT DETECTED Final    Comment: (NOTE) The GeneXpert MRSA Assay (FDA approved for NASAL specimens only), is one component of a comprehensive MRSA colonization surveillance program. It is not intended to diagnose MRSA infection nor to guide or monitor treatment for MRSA infections. Test performance is  not FDA approved in patients less than 29 years old. Performed at [facility], [address], [place], [place] [address]          Radiology Studies: No results found.      Scheduled Meds:  acidophilus  2 capsule Oral TID   Continuous Infusions:  morphine 2 mg/hr (07/09/21 1043)    Assessment & Plan:   Principal Problem:   Sepsis due to gram-negative UTI ([place]) Active Problems:   COPD (chronic obstructive  pulmonary disease) ([place])   Aspiration pneumonia of both lower lobes due to gastric secretions (HCC)   Chronic hypoxemic respiratory failure (HCC)   Sepsis (HCC)   Colovesical fistula   Acute on chronic respiratory failure with hypoxemia (HCC)   Hypoxia   1.  Acute metabolic encephalopathy with delirium secondary to UTI, more from hypoxemia and PNA    Patient with colovesical fistula O2 support 12/10 Zosyn was discontinued since patient is on comfort measures now       2.  Sepsis due to UTI/PNA Continue iv zoysn Solucortif for adrenal insuff.  Was on midodrine 12/10 continue comfort measures     3. Acute on chronic hypoxemic respiratory failure With recent hx/o covid Being tx for aspiration pna Wbc worse. He coughed up food debris after eating. 12/8 with diarrhea, and hypotension, lasix was discontinued. Holding ivf to prevent vol. Overload as PCCM also agrees. Currently on HF /FM.  12/10 continue comfort measures  4.  Type 2 diabetes mellitus with peripheral neuropathy and stage III b chronic kidney disease. Bg stable. Continue neurontin Holding metformin 12/10 continue comfort measures   5.  GERD. PI stopped   6.  Dyslipidemia. Statin discontinued  7.  BPH. Flomax discontinued    8.  COPD without exacerbation with chronic respiratory failure. As above  8.  Recent COVID-19 pneumonia. On comfort measures   9.  Abnormal EKG  Not afib. Lots of movement artifact , see sr, with pacs.       DVT prophylaxis: Lovenox   Code Status: DNR  Family Communication: wife at bedside Disposition Plan:  Status is: Inpatient  Remains inpatient appropriate because: IV treatment, patient requiring high flow oxygen.poor px currently patient on comfort measures             LOS: 4 days   Time spent: 20 minutes with more than 50% on [place], MD [facility] Pager [phone]  If 7PM-7AM, please contact night-coverage 07/10/2021, 9:05 AM

## 2021-07-11 DIAGNOSIS — N390 Urinary tract infection, site not specified: Secondary | ICD-10-CM | POA: Diagnosis not present

## 2021-07-11 DIAGNOSIS — A4150 Gram-negative sepsis, unspecified: Secondary | ICD-10-CM | POA: Diagnosis not present

## 2021-07-11 LAB — CULTURE, BLOOD (ROUTINE X 2)
Culture: NO GROWTH
Culture: NO GROWTH
Special Requests: ADEQUATE
Special Requests: ADEQUATE

## 2021-07-11 NOTE — Progress Notes (Signed)
Patient is having brown liquid stools. Has had two since 7pm. Patient is already on airborne precautions.

## 2021-07-11 NOTE — Progress Notes (Signed)
PROGRESS NOTE    Bobby Peterson  QMG:[id number] DOB: 12-03-1940 DOA: 07/04/2021 PCP: Virginia Crews, MD    Brief Narrative:  Bobby Peterson is a 80 y.o. Caucasian male with medical history significant for COPD on home O2 at 2 L/min, diastolic CHF, stage III chronic kidney disease, type II diabetes mellitus, coronary artery disease, colovesical fistula and recent COVID pneumonia and acute on chronic respiratory failure for which she was admitted here on 11/29 and discharged on 12/3.  He did fairly well after his discharge, per his wife but unfortunately became more confused over the last 24 hours and has developed visual hallucinations.  He had urinary and stool incontinence.  He did not have any reported chest pain or dyspnea or cough or wheezing.  No fever or chills.  No nausea or vomiting or abdominal pain were reported.   ED Course: Upon presentation to the emergency room, vital signs were within normal with a pulse ox of 97% on 2 L of O2 by nasal cannula.  Labs revealed BUN of 44 and creatinine of 1.38.  And previous levels with potassium 5 and total protein 6.3 and albumin of 3.1.  CBC showed leukocytosis 16.8.  UA was positive for UTI.[name]  12/7 pt on HF . Appears tired.wife reports thinks he is little better. Pt unable to really tell me if getting better. Wife said "they gave him 3 months". Tried explaining to her that we need to see how he is going to do next 48 hrs.   12/8 I called RR as pt was requiring more 02. Spoke to PCCm this am, pt with poor px with current hyoxemia/pna, severe comorbid condtions and advanced age. Hospice was consulted.  Had a long discussion with wife and pts daughter , they are made aware of pts px.  PCCM and I agree to transition pt to comfort measures/hospice. Wife does not want hospice house, wants to take pt home with hospice.  12/9 on morphine gtt. Family at bedside, snoring 12/10 wife at bedside.  Patient on facemask. 12/11 shallow breathing , wife at  bedside  Consultants:  PCCM  Procedures:   Antimicrobials:  zosyn    Subjective: On [place].   Objective: Vitals:   07/08/21 2339 07/09/21 1939 07/10/21 0836 07/10/21 1943  BP: 120/81  (!) 86/63 118/63  Pulse: (!) 141 (!) 148 (!) 139 (!) 143  Resp:  20 14 16   Temp:  99.2 F (37.3 C) 98.3 F (36.8 C) 100.3 F (37.9 C)  TempSrc:  Oral  Axillary  SpO2: 91% 95% 99% (!) 89%  Weight:      Height:        Intake/Output Summary (Last 24 hours) at 07/11/2021 0924 Last data filed at 07/10/2021 1527 Gross per 24 hour  Intake 56.94 ml  Output --  Net 56.94 ml   Filed Weights   07/11/2021 2303  Weight: 74.8 kg    Examination: Shallow breathing, calm Rhonchi, poor respriatory effort Reg s1/s2 no gallop Soft benign +bs No edema   Data Reviewed: I have personally reviewed following labs and imaging studies  CBC: Recent Labs  Lab 07/30/2021 2315 07/06/21 0257 07/07/21 0639  WBC 16.5* 14.6* 18.2*  NEUTROABS 14.5*  --  14.5*  HGB 13.9 13.8 14.3  HCT 43.7 43.3 45.0  MCV 89.7 88.7 88.9  PLT 282 271 326   Basic Metabolic Panel: Recent Labs  Lab 07/31/2021 2315 07/06/21 0257 07/07/21 0639  NA 137 141 139  K 5.0 4.7 4.3  CL 101 101 96*  CO2 28 32 32  GLUCOSE 187* 159* 89  BUN 44* 44* 38*  CREATININE 1.38* 1.43* 1.62*  CALCIUM 9.4 9.4 9.0  MG  --  2.4 2.3   GFR: Estimated Creatinine Clearance: 33.2 mL/min (A) (by C-G formula based on SCr of 1.62 mg/dL (H)). Liver Function Tests: Recent Labs  Lab 07/02/2021 2315  AST 15  ALT 14  ALKPHOS 64  BILITOT 1.0  PROT 6.3*  ALBUMIN 3.1*   No results for input(s): LIPASE, AMYLASE in the last 168 hours. No results for input(s): AMMONIA in the last 168 hours. Coagulation Profile: Recent Labs  Lab 07/06/21 0257  INR 1.1   Cardiac Enzymes: No results for input(s): CKTOTAL, CKMB, CKMBINDEX, TROPONINI in the last 168 hours. BNP (last 3 results) No results for input(s): PROBNP in the last 8760 hours. HbA1C: No results  for input(s): HGBA1C in the last 72 hours. CBG: Recent Labs  Lab 07/10/21 1543  GLUCAP 110*   Lipid Profile: No results for input(s): CHOL, HDL, LDLCALC, TRIG, CHOLHDL, LDLDIRECT in the last 72 hours. Thyroid Function Tests: No results for input(s): TSH, T4TOTAL, FREET4, T3FREE, THYROIDAB in the last 72 hours.  Anemia Panel: No results for input(s): VITAMINB12, FOLATE, FERRITIN, TIBC, IRON, RETICCTPCT in the last 72 hours. Sepsis Labs: Recent Labs  Lab 07/06/21 0257  PROCALCITON <0.10    Recent Results (from the past 240 hour(s))  CULTURE, BLOOD (ROUTINE X 2) w Reflex to ID Panel     Status: None   Collection Time: 07/06/21  2:57 AM   Specimen: BLOOD  Result Value Ref Range Status   Specimen Description BLOOD RIGHT HAND  Final   Special Requests   Final    BOTTLES DRAWN AEROBIC ONLY Blood Culture adequate volume   Culture   Final    NO GROWTH 5 DAYS Performed at [facility], [address] [place], [place] [address]    Report Status 07/11/2021 FINAL  Final  CULTURE, BLOOD (ROUTINE X 2) w Reflex to ID Panel     Status: None   Collection Time: 07/06/21  2:58 AM   Specimen: BLOOD  Result Value Ref Range Status   Specimen Description BLOOD RIGTH [facility]  Final   Special Requests   Final    BOTTLES DRAWN AEROBIC AND ANAEROBIC Blood Culture adequate volume   Culture   Final    NO GROWTH 5 DAYS Performed at [facility], [address] [place], [place] [address]    Report Status 07/11/2021 FINAL  Final  MRSA Next Gen by PCR, Nasal     Status: None   Collection Time: 07/07/21 11:52 AM   Specimen: Nasal Mucosa; Nasal Swab  Result Value Ref Range Status   MRSA by PCR Next Gen NOT DETECTED NOT DETECTED Final    Comment: (NOTE) The GeneXpert MRSA Assay (FDA approved for NASAL specimens only), is one component of a comprehensive MRSA colonization surveillance program. It is not intended to diagnose MRSA infection nor to guide or monitor treatment for MRSA  infections. Test performance is not FDA approved in patients less than 84 years old. Performed at [facility], [address] [place], [place] [address]          Radiology Studies: No results found.      Scheduled Meds:  acidophilus  2 capsule Oral TID   Continuous Infusions:  morphine 2 mg/hr (07/10/21 1852)    Assessment & Plan:   Principal Problem:  Sepsis due to gram-negative UTI (HCC) Active Problems:   COPD (chronic obstructive pulmonary disease) (HCC)   Aspiration pneumonia of both lower lobes due to gastric secretions (HCC)   Chronic hypoxemic respiratory failure (HCC)   Sepsis (HCC)   Colovesical fistula   Acute on chronic respiratory failure with hypoxemia (HCC)   Hypoxia   1.  Acute metabolic encephalopathy with delirium secondary to UTI, more from hypoxemia and PNA    Patient with colovesical fistula O2 support.  He was on Zosyn but discontinued due to below 12/11 continue comfort measures       2.  Sepsis due to UTI/PNA Continue iv zoysn Solucortif for adrenal insuff.  Was on midodrine 12/11 continue comfort measures      3. Acute on chronic hypoxemic respiratory failure With recent hx/o covid Being tx for aspiration pna Wbc worse. He coughed up food debris after eating. 12/8 with diarrhea, and hypotension, lasix was discontinued. Holding ivf to prevent vol. Overload as PCCM also agrees. Currently on HF /FM.  12/11 continue comfort measures  4.  Type 2 diabetes mellitus with peripheral neuropathy and stage III b chronic kidney disease. Bg stable. Continue neurontin Holding metformin 12/11 continue comfort measures     5.  GERD. PI stopped   6.  Dyslipidemia. Statin discontinued  7.  BPH. Flomax discontinued    8.  COPD without exacerbation with chronic respiratory failure. As above  8.  Recent COVID-19 pneumonia. On comfort measures   9.  Abnormal EKG  Not afib. Lots of movement artifact , see sr,  with pacs.       DVT prophylaxis: Lovenox  Code Status: DNR  Family Communication: wife at bedside Disposition Plan:  Status is: Inpatient  Remains inpatient appropriate because: IV treatment, patient requiring high flow oxygen.poor px currently patient on comfort measures             LOS: 5 days   Time spent: 20 minutes with more than 50% on [place], MD [facility] Pager [phone]  If 7PM-7AM, please contact night-coverage 07/11/2021, 9:24 AM

## 2021-07-12 DIAGNOSIS — A4150 Gram-negative sepsis, unspecified: Secondary | ICD-10-CM | POA: Diagnosis not present

## 2021-07-12 DIAGNOSIS — N390 Urinary tract infection, site not specified: Secondary | ICD-10-CM | POA: Diagnosis not present

## 2021-08-01 NOTE — Progress Notes (Signed)
Patient  moved down to 1-C 105.  Patient's wife was notified.

## 2021-08-01 NOTE — Progress Notes (Signed)
Received message from nursing:  Patient expired at 2108. Family informed.

## 2021-08-01 NOTE — Progress Notes (Signed)
Patient expired at 2108, noted no heart beat, no respiration and no pulse, Hospitalist notified, family notified, [facility] notified and [facility] notified.

## 2021-08-01 NOTE — Progress Notes (Signed)
I responded to a page from the nurse to provide spiritual support for the patient's family. I provided support through pastoral presence, words of comfort, and by leading in prayer.    07/23/21 2250  Clinical Encounter Type  Visited With Patient and family together  Visit Type Spiritual support;Death  Referral From Nurse  Consult/Referral To Chaplain  Spiritual Encounters  Spiritual Needs Prayer;Emotional;Grief support    Chaplain Dr Redgie Grayer

## 2021-08-01 NOTE — Care Management Important Message (Signed)
Important Message  Patient Details  Name: Bobby Peterson MRN: [id number] Date of Birth: 01-Jul-1941   Medicare Important Message Given:  Other (see comment)  On comfort care measures.  Medicare IM withheld at this time.    Dannette Barbara August 11, 2021, 9:53 AM

## 2021-08-01 NOTE — Death Summary Note (Signed)
Death Summary  Bobby Peterson TIR:[id number] DOB: March 12, 1941 DOA: 13-Jul-2021  PCP: Virginia Crews, MD  Admit date: 07/04/2021 Date of Death: 20-Jul-2021 Time of Death: Oct 23, 2106 Notification: Virginia Crews, MD notified of death of 2021-07-24   History of present illness:  Bobby Peterson is a 81 y.o. male with a history of significant for COPD on home O2 at 2 L/min, diastolic CHF, stage III chronic kidney disease, type II diabetes mellitus, coronary artery disease, colovesical fistula and recent COVID pneumonia and acute on chronic respiratory failure for which she was admitted here on 11/29 and discharged on 12/3.  He did fairly well after his discharge, per his wife but unfortunately became more confused over the last 24 hours and has developed visual hallucinations. He was found with UA positive and leukocytosis in ED. Patient was found with hypoxemia , sepsis due to UTI and multifocal pneumonia. He was also found to be positive for covid 19.He was being treated with antibiotics. Pulmonary was consulted. Patient was requiring high flow oxygen and palliative/hospice care was consulted. Family decided due to patient condition and poor prognosis to make patient comfort care.    Final Diagnoses:  1.   Acute on chronic hypoxemic respiratory failure 2. Advanced coped with chronic hypoxemia 3. UTI 4.Multifocal Pneumonia 5.Sepsis with septic shock 6.diarrhea 7.Acute metabolic encephalopathy 8.Delirium 9.Advanced age 50.Covid 19 infection 11.Type 2 diabetes  12. CKD stage III b 13. Peripheral neuropathy'14. BP 14. Chronic Diastolic CHF 15.Dyslipidemia 16.GERD 17.Atrial tachycardia 18. CAD 3.HTN 20.history of Renal cancer-s/p nephrectomy 21. 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    Significant Diagnostic Studies: DG Chest 2 View  Result Date: 06/28/2021 CLINICAL DATA:  Flu symptoms.  COPD and heart failure. EXAM: CHEST - 2 VIEW COMPARISON:  09/21/2018 FINDINGS: Hyperinflation. Osteopenia. Midline trachea. Borderline cardiomegaly. Atherosclerosis in the transverse aorta. No pleural effusion or pneumothorax. Lower lung predominant interstitial thickening. No lobar consolidation. Bibasilar scarring. IMPRESSION: No evidence of pneumonia. COPD as evidenced by hyperinflation and basilar predominant interstitial thickening. Aortic Atherosclerosis (ICD10-I70.0). Electronically Signed   By: Abigail Miyamoto M.D.   On: 06/28/2021 10:53   CT Abdomen Pelvis W Contrast  Result Date: 06/28/2021 CLINICAL DATA:  Moderate-sized hiatal hernia. EXAM: CT ABDOMEN AND PELVIS WITH CONTRAST TECHNIQUE: Multidetector CT imaging of the abdomen and pelvis was performed using the standard protocol following bolus administration of intravenous contrast. CONTRAST:  65mL OMNIPAQUE IOHEXOL 300 MG/ML  SOLN COMPARISON:  CT with IV contrast 12/08/2017, CT with oral contrast 01/12/2015. FINDINGS: Lower chest: There small areas of peripheral consolidation concerning for multifocal pneumonia in the lung bases, including in the anterior and posteromedial right lower lobe, the anterior right middle lobe, and the anterior base of the left lower lobe. There is mild cardiomegaly increased from prior studies but no pericardial effusion. There is a moderate-sized hiatal hernia. A calcified granuloma is again noted in the left lower lobe. Hepatobiliary: The liver is normal in size and mildly steatotic included with focal fat in the left lobe chronically noted as well as left hepatic pneumobilia with partially air distended common bile duct post cholecystectomy. There is chronic prominence of the common bile duct, measuring 10 mm. There is a 1.5 x 0.8 cm nodular lesion along the posterolateral aspect of the right lobe  of the liver which would either represent a subcapsular lesion or a peritoneal lesion,  previously 1.3 x 0.8 cm. Pancreas: No mass enhancement or ductal dilatation. Spleen: Calcified granulomas are again seen. No splenomegaly or mass enhancement. 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 There is mild chronic nodular thickening of the left adrenal gland with left kidney again demonstrating a 2.1 cm cyst in the inferior pole and few tiny lower pole cortical hypodensities which are stable but too small to fully characterize. There are no appreciable stones or hydronephrosis. Evaluation of the bladder again demonstrates a direct fistula to the mid sigmoid colon along the left anterior wall with moderate generalized thickening of the bladder and perivesical stranding consistent with cystitis. A large bladder stone has developed measuring 4.0 x 3.3 by 2.7 cm, and there is air in the anterior bladder. Stomach/Bowel: Unremarkable contracted stomach and small bowel. The appendix is normal caliber, well visible. There are diffuse colonic diverticula greatest in the sigmoid with increased stranding along the mid third of the sigmoid colon including the portion communicating with the left anterior bladder wall, findings consistent with acute sigmoid diverticulitis. There is no free air or abscess. Vascular/Lymphatic: Aortic and branch vessel atherosclerosis. No enlarged abdominal or pelvic lymph nodes. Reproductive: Enlarged prostate 5 cm transverse, previously 4.7 cm. Other: No free air, hemorrhage or fluid. Small umbilical and inguinal fat hernias. Musculoskeletal: There is osteopenia with degenerative changes of the thoracic and lumbar spine but no worrisome regional skeletal lesion. IMPRESSION: 1. Peripheral opacities in the lung bases most likely due to multifocal pneumonia. 2. As in 2019 there is direct fistulous communication  between the mid sigmoid colon and left anterior bladder wall with air in the bladder, moderate bladder thickening and adjacent stranding consistent with cystitis, and increased stranding around the mid sigmoid colon consistent with at least a mild acute diverticulitis. No free air or abscess is seen. 3. A large stone has developed in the dependent portion of the bladder measuring up to 4 cm. 4. 1.5 x 0.8 cm nodular lesion adjacent the posterolateral right hepatic lobe, only slightly larger than on the prior studies, probable benign process but PET-CT may be indicated given the slight interval change. This would either be a subcapsular protruding liver lesion or a peritoneal lesion abutting the liver capsule. 5. Prostatomegaly and remaining findings discussed above. Electronically Signed   By: Telford Nab M.D.   On: 06/28/2021 20:50   DG Chest Port 1 View  Result Date: 07/06/2021 CLINICAL DATA:  Acute hypoxemic respiratory failure. Diagnosed with COVID-19 infection 8 days prior. EXAM: PORTABLE CHEST 1 VIEW COMPARISON:  Radiographs 07/09/2021, 07/03/2021 and 06/28/2021. CT abdomen 06/28/2021. FINDINGS: 1426 hours. The heart size and mediastinal contours are stable with aortic atherosclerosis. Probable new small left pleural effusion with increased patchy airspace disease at the left lung base. Patchy right basilar opacity is unchanged from the recent prior studies, although increased from older prior studies. No consolidation, edema or pneumothorax. The bones appear unremarkable. Telemetry leads overlie the chest. IMPRESSION: Patchy bibasilar airspace opacities, with slight interval worsening on the left and a probable small left pleural effusion. Findings are nonspecific, although in this setting suspicious for mild viral pneumonia. Electronically Signed   By: Richardean Sale M.D.   On: 07/06/2021 14:44   DG Chest Portable 1 View  Result Date: 07/21/2021 CLINICAL DATA:  Cough and confusion, initial  encounter EXAM: PORTABLE CHEST 1 VIEW COMPARISON:  07/03/2021 FINDINGS: Cardiac shadow is stable. Aortic calcifications are noted. Lungs are well aerated bilaterally  with mild bibasilar atelectasis. No sizable effusion is seen. No bony abnormality is noted. IMPRESSION: Mild bibasilar atelectasis. Electronically Signed   By: Inez Catalina M.D.   On: 07/11/2021 23:35   DG Chest Port 1 View  Result Date: 07/03/2021 CLINICAL DATA:  Pneumonia due to COVID-19 virus EXAM: PORTABLE CHEST 1 VIEW COMPARISON:  06/28/2021 FINDINGS: Ill-defined patchy density superimposed on chronic interstitial changes of COPD. No significant pleural effusion. No pneumothorax. Stable cardiomediastinal contours. IMPRESSION: Patchy increased density superimposed on chronic interstitial changes may reflect atypical/viral pneumonia. Electronically Signed   By: Macy Mis M.D.   On: 07/03/2021 12:35    Microbiology: Recent Results (from the past 240 hour(s))  MRSA Next Gen by PCR, Nasal     Status: None   Collection Time: 07/07/21 11:52 AM   Specimen: Nasal Mucosa; Nasal Swab  Result Value Ref Range Status   MRSA by PCR Next Gen NOT DETECTED NOT DETECTED Final    Comment: (NOTE) The GeneXpert MRSA Assay (FDA approved for NASAL specimens only), is one component of a comprehensive MRSA colonization surveillance program. It is not intended to diagnose MRSA infection nor to guide or monitor treatment for MRSA infections. Test performance is not FDA approved in patients less than 35 years old. Performed at [facility], [address] [place], [place] [address]      Labs: Basic Metabolic Panel: No results for input(s): NA, K, CL, CO2, GLUCOSE, BUN, CREATININE, CALCIUM, MG, PHOS in the last 168 hours. Liver Function Tests: No results for input(s): AST, ALT, ALKPHOS, BILITOT, PROT, ALBUMIN in the last 168 hours. No results for input(s): LIPASE, AMYLASE in the last 168 hours. No results for input(s): AMMONIA in  the last 168 hours. CBC: No results for input(s): WBC, NEUTROABS, HGB, HCT, MCV, PLT in the last 168 hours. Cardiac Enzymes: No results for input(s): CKTOTAL, CKMB, CKMBINDEX, TROPONINI in the last 168 hours. D-Dimer No results for input(s): DDIMER in the last 72 hours. BNP: Invalid input(s): POCBNP CBG: Recent Labs  Lab 07/10/21 1543  GLUCAP 110*   Anemia work up No results for input(s): VITAMINB12, FOLATE, FERRITIN, TIBC, IRON, RETICCTPCT in the last 72 hours. Urinalysis    Component Value Date/Time   COLORURINE YELLOW (A) 07/17/2021 2315   APPEARANCEUR TURBID (A) 07/04/2021 2315   APPEARANCEUR Cloudy (A) 11/16/2017 1015   LABSPEC 1.006 07/08/2021 2315   PHURINE 8.0 07/06/2021 2315   GLUCOSEU NEGATIVE 07/15/2021 2315   HGBUR LARGE (A) 07/19/2021 2315   BILIRUBINUR NEGATIVE 07/26/2021 2315   BILIRUBINUR small 05/14/2018 1057   BILIRUBINUR Negative 11/16/2017 [place] 07/16/2021 2315   PROTEINUR 30 (A) 07/28/2021 2315   UROBILINOGEN 4.0 (A) 05/14/2018 1057   NITRITE NEGATIVE 07/19/2021 2315   LEUKOCYTESUR LARGE (A) 07/29/2021 2315   Sepsis Labs Invalid input(s): PROCALCITONIN,  WBC,  LACTICIDVEN     SIGNED:  Nolberto Hanlon, MD  [facility] 07/16/2021, 10:12 AM Pager   If 7PM-7AM, please contact night-coverage [email or web address] Password [email or web address]

## 2021-08-01 NOTE — Progress Notes (Signed)
PROGRESS NOTE    Bobby Peterson  NWG:[id number] DOB: Jan 07, 1941 DOA: 07/21/2021 PCP: Virginia Crews, MD    Brief Narrative:  Bobby Peterson is a 81 y.o. Caucasian male with medical history significant for COPD on home O2 at 2 L/min, diastolic CHF, stage III chronic kidney disease, type II diabetes mellitus, coronary artery disease, colovesical fistula and recent COVID pneumonia and acute on chronic respiratory failure for which she was admitted here on 11/29 and discharged on 12/3.  He did fairly well after his discharge, per his wife but unfortunately became more confused over the last 24 hours and has developed visual hallucinations.  He had urinary and stool incontinence.  He did not have any reported chest pain or dyspnea or cough or wheezing.  No fever or chills.  No nausea or vomiting or abdominal pain were reported.   ED Course: Upon presentation to the emergency room, vital signs were within normal with a pulse ox of 97% on 2 L of O2 by nasal cannula.  Labs revealed BUN of 44 and creatinine of 1.38.  And previous levels with potassium 5 and total protein 6.3 and albumin of 3.1.  CBC showed leukocytosis 16.8.  UA was positive for UTI.[name]  12/7 pt on HF . Appears tired.wife reports thinks he is little better. Pt unable to really tell me if getting better. Wife said "they gave him 3 months". Tried explaining to her that we need to see how he is going to do next 48 hrs.   12/8 I called RR as pt was requiring more 02. Spoke to PCCm this am, pt with poor px with current hyoxemia/pna, severe comorbid condtions and advanced age. Hospice was consulted.  Had a long discussion with wife and pts daughter , they are made aware of pts px.  PCCM and I agree to transition pt to comfort measures/hospice. Wife does not want hospice house, wants to take pt home with hospice.  12/9 on morphine gtt. Family at bedside, snoring 12/10 wife at bedside.  Patient on facemask. 12/11 shallow breathing , wife at  bedside 12/11 calm, shallow breathing. Wife at bedside  Consultants:  PCCM  Procedures:   Antimicrobials:  zosyn    Subjective: On [place] 4L .    Objective: Vitals:   07/10/21 0836 07/10/21 1943 Jul 23, 2021 0538 07/23/21 0722  BP: (!) 86/63 118/63 111/68 103/73  Pulse: (!) 139 (!) 143 (!) 150 94  Resp: 14 16 16 20   Temp: 98.3 F (36.8 C) 100.3 F (37.9 C) 100.1 F (37.8 C) 98.3 F (36.8 C)  TempSrc:  Axillary Axillary   SpO2: 99% (!) 89% (!) 87% 100%  Weight:      Height:        Intake/Output Summary (Last 24 hours) at 2021-07-23 0932 Last data filed at 07/11/2021 1407 Gross per 24 hour  Intake 45.16 ml  Output --  Net 45.16 ml   Filed Weights   07/24/2021 2303  Weight: 74.8 kg    Examination: Shallow breathing, nad Bronchial /rhonchus bs Reg s1/s2 no gallop Soft benign  No edema   Data Reviewed: I have personally reviewed following labs and imaging studies  CBC: Recent Labs  Lab 07/30/2021 2315 07/06/21 0257 07/07/21 0639  WBC 16.5* 14.6* 18.2*  NEUTROABS 14.5*  --  14.5*  HGB 13.9 13.8 14.3  HCT 43.7 43.3 45.0  MCV 89.7 88.7 88.9  PLT 282 271 578   Basic Metabolic Panel: Recent Labs  Lab 07/05/2021 2315 07/06/21 0257  07/07/21 0639  NA 137 141 139  K 5.0 4.7 4.3  CL 101 101 96*  CO2 28 32 32  GLUCOSE 187* 159* 89  BUN 44* 44* 38*  CREATININE 1.38* 1.43* 1.62*  CALCIUM 9.4 9.4 9.0  MG  --  2.4 2.3   GFR: Estimated Creatinine Clearance: 33.2 mL/min (A) (by C-G formula based on SCr of 1.62 mg/dL (H)). Liver Function Tests: Recent Labs  Lab 07/28/2021 2315  AST 15  ALT 14  ALKPHOS 64  BILITOT 1.0  PROT 6.3*  ALBUMIN 3.1*   No results for input(s): LIPASE, AMYLASE in the last 168 hours. No results for input(s): AMMONIA in the last 168 hours. Coagulation Profile: Recent Labs  Lab 07/06/21 0257  INR 1.1   Cardiac Enzymes: No results for input(s): CKTOTAL, CKMB, CKMBINDEX, TROPONINI in the last 168 hours. BNP (last 3 results) No  results for input(s): PROBNP in the last 8760 hours. HbA1C: No results for input(s): HGBA1C in the last 72 hours. CBG: Recent Labs  Lab 07/10/21 1543  GLUCAP 110*   Lipid Profile: No results for input(s): CHOL, HDL, LDLCALC, TRIG, CHOLHDL, LDLDIRECT in the last 72 hours. Thyroid Function Tests: No results for input(s): TSH, T4TOTAL, FREET4, T3FREE, THYROIDAB in the last 72 hours.  Anemia Panel: No results for input(s): VITAMINB12, FOLATE, FERRITIN, TIBC, IRON, RETICCTPCT in the last 72 hours. Sepsis Labs: Recent Labs  Lab 07/06/21 0257  PROCALCITON <0.10    Recent Results (from the past 240 hour(s))  CULTURE, BLOOD (ROUTINE X 2) w Reflex to ID Panel     Status: None   Collection Time: 07/06/21  2:57 AM   Specimen: BLOOD  Result Value Ref Range Status   Specimen Description BLOOD RIGHT HAND  Final   Special Requests   Final    BOTTLES DRAWN AEROBIC ONLY Blood Culture adequate volume   Culture   Final    NO GROWTH 5 DAYS Performed at [facility], [address] [place], [place] [address]    Report Status 07/11/2021 FINAL  Final  CULTURE, BLOOD (ROUTINE X 2) w Reflex to ID Panel     Status: None   Collection Time: 07/06/21  2:58 AM   Specimen: BLOOD  Result Value Ref Range Status   Specimen Description BLOOD RIGTH [facility]  Final   Special Requests   Final    BOTTLES DRAWN AEROBIC AND ANAEROBIC Blood Culture adequate volume   Culture   Final    NO GROWTH 5 DAYS Performed at [facility], [address] [place], [place] [address]    Report Status 07/11/2021 FINAL  Final  MRSA Next Gen by PCR, Nasal     Status: None   Collection Time: 07/07/21 11:52 AM   Specimen: Nasal Mucosa; Nasal Swab  Result Value Ref Range Status   MRSA by PCR Next Gen NOT DETECTED NOT DETECTED Final    Comment: (NOTE) The GeneXpert MRSA Assay (FDA approved for NASAL specimens only), is one component of a comprehensive MRSA colonization surveillance program. It is not intended  to diagnose MRSA infection nor to guide or monitor treatment for MRSA infections. Test performance is not FDA approved in patients less than 45 years old. Performed at [facility], [address] [place], [place] [address]          Radiology Studies: No results found.      Scheduled Meds:  acidophilus  2 capsule Oral TID   Continuous Infusions:  morphine 2 mg/hr (07/11/21  1407)    Assessment & Plan:   Principal Problem:   Sepsis due to gram-negative UTI (HCC) Active Problems:   COPD (chronic obstructive pulmonary disease) (HCC)   Aspiration pneumonia of both lower lobes due to gastric secretions (HCC)   Chronic hypoxemic respiratory failure (HCC)   Sepsis (HCC)   Colovesical fistula   Acute on chronic respiratory failure with hypoxemia (HCC)   Hypoxia   1.  Acute metabolic encephalopathy with delirium secondary to UTI, more from hypoxemia and PNA    Patient with colovesical fistula O2 support.  He was on Zosyn but discontinued due to below 12/12 continue comfort measures         2.  Sepsis due to UTI/PNA Continue iv zoysn Solucortif for adrenal insuff.  Was on midodrine 12/12 continue comfort measures       3. Acute on chronic hypoxemic respiratory failure With recent hx/o covid Being tx for aspiration pna Wbc worse. He coughed up food debris after eating. 12/8 with diarrhea, and hypotension, lasix was discontinued. Holding ivf to prevent vol. Overload as PCCM also agrees. Currently on HF /FM.  12/12 continue comfort measures    4.  Type 2 diabetes mellitus with peripheral neuropathy and stage III b chronic kidney disease. Bg stable. Continue neurontin Holding metformin 12/12 continue comfort measures       5.  GERD. PI stopped   6.  Dyslipidemia. Statin discontinued  7.  BPH. Flomax discontinued    8.  COPD without exacerbation with chronic respiratory failure. As above  8.  Recent COVID-19 pneumonia. On  comfort measures   9.  Abnormal EKG  Not afib. Lots of movement artifact , see sr, with pacs.       DVT prophylaxis: Lovenox  Code Status: DNR  Family Communication: wife at bedside Disposition Plan:  Status is: Inpatient  Remains inpatient appropriate because: IV treatment, patient requiring high flow oxygen.poor px currently patient on comfort measures             LOS: 6 days   Time spent: 20 minutes with more than 50% on [place], MD [facility] Pager [phone]  If 7PM-7AM, please contact night-coverage 2021/07/23, 9:32 AM

## 2021-08-01 DEATH — deceased

## 2021-09-02 ENCOUNTER — Ambulatory Visit: Payer: Medicare HMO | Admitting: Family Medicine
